# Patient Record
Sex: Male | Born: 1945 | Race: White | Hispanic: No | Marital: Married | Smoking: Never smoker
Health system: Southern US, Community
[De-identification: ages and names within clinical notes are randomized; demographics above are authoritative.]

## PROBLEM LIST (undated history)

## (undated) DIAGNOSIS — K5790 Diverticulosis of intestine, part unspecified, without perforation or abscess without bleeding: Secondary | ICD-10-CM

## (undated) DIAGNOSIS — N529 Male erectile dysfunction, unspecified: Secondary | ICD-10-CM

## (undated) DIAGNOSIS — F329 Major depressive disorder, single episode, unspecified: Secondary | ICD-10-CM

## (undated) DIAGNOSIS — R351 Nocturia: Secondary | ICD-10-CM

## (undated) DIAGNOSIS — Z86010 Personal history of colonic polyps: Secondary | ICD-10-CM

## (undated) DIAGNOSIS — E785 Hyperlipidemia, unspecified: Secondary | ICD-10-CM

## (undated) DIAGNOSIS — F32A Depression, unspecified: Secondary | ICD-10-CM

## (undated) DIAGNOSIS — I4891 Unspecified atrial fibrillation: Secondary | ICD-10-CM

## (undated) DIAGNOSIS — R7301 Impaired fasting glucose: Secondary | ICD-10-CM

## (undated) DIAGNOSIS — H269 Unspecified cataract: Secondary | ICD-10-CM

## (undated) DIAGNOSIS — N400 Enlarged prostate without lower urinary tract symptoms: Secondary | ICD-10-CM

## (undated) DIAGNOSIS — I10 Essential (primary) hypertension: Secondary | ICD-10-CM

## (undated) HISTORY — DX: Depression, unspecified: F32.A

## (undated) HISTORY — PX: OTHER SURGICAL HISTORY: SHX169

## (undated) HISTORY — DX: Unspecified atrial fibrillation: I48.91

## (undated) HISTORY — DX: Unspecified cataract: H26.9

## (undated) HISTORY — PX: CATARACT EXTRACTION: SUR2

## (undated) HISTORY — PX: COLONOSCOPY: SHX174

## (undated) HISTORY — DX: Essential (primary) hypertension: I10

## (undated) HISTORY — DX: Personal history of colonic polyps: Z86.010

## (undated) HISTORY — DX: Nocturia: R35.1

## (undated) HISTORY — DX: Major depressive disorder, single episode, unspecified: F32.9

---

## 1996-07-29 DIAGNOSIS — C4491 Basal cell carcinoma of skin, unspecified: Secondary | ICD-10-CM

## 1996-07-29 HISTORY — DX: Basal cell carcinoma of skin, unspecified: C44.91

## 2000-06-14 DIAGNOSIS — D229 Melanocytic nevi, unspecified: Secondary | ICD-10-CM

## 2000-06-14 HISTORY — DX: Melanocytic nevi, unspecified: D22.9

## 2001-06-08 ENCOUNTER — Encounter: Payer: Self-pay | Admitting: Family Medicine

## 2001-06-08 ENCOUNTER — Encounter: Admission: RE | Admit: 2001-06-08 | Discharge: 2001-06-08 | Payer: Self-pay | Admitting: Family Medicine

## 2001-07-02 DIAGNOSIS — C4492 Squamous cell carcinoma of skin, unspecified: Secondary | ICD-10-CM

## 2001-07-02 HISTORY — DX: Squamous cell carcinoma of skin, unspecified: C44.92

## 2004-01-07 ENCOUNTER — Encounter: Payer: Self-pay | Admitting: Orthopedic Surgery

## 2005-02-05 ENCOUNTER — Ambulatory Visit (HOSPITAL_COMMUNITY): Admission: RE | Admit: 2005-02-05 | Discharge: 2005-02-05 | Payer: Self-pay | Admitting: Family Medicine

## 2005-04-04 ENCOUNTER — Ambulatory Visit: Payer: Self-pay | Admitting: Internal Medicine

## 2005-04-19 ENCOUNTER — Ambulatory Visit: Payer: Self-pay | Admitting: Internal Medicine

## 2005-04-19 ENCOUNTER — Encounter (INDEPENDENT_AMBULATORY_CARE_PROVIDER_SITE_OTHER): Payer: Self-pay | Admitting: *Deleted

## 2005-04-19 DIAGNOSIS — Z860100 Personal history of colon polyps, unspecified: Secondary | ICD-10-CM

## 2005-04-19 HISTORY — DX: Personal history of colonic polyps: Z86.010

## 2005-04-19 HISTORY — DX: Personal history of colon polyps, unspecified: Z86.0100

## 2007-01-20 ENCOUNTER — Emergency Department (HOSPITAL_COMMUNITY): Admission: EM | Admit: 2007-01-20 | Discharge: 2007-01-20 | Payer: Self-pay | Admitting: Emergency Medicine

## 2009-02-02 ENCOUNTER — Ambulatory Visit: Payer: Self-pay | Admitting: Orthopedic Surgery

## 2009-02-02 ENCOUNTER — Ambulatory Visit (HOSPITAL_COMMUNITY): Admission: RE | Admit: 2009-02-02 | Discharge: 2009-02-02 | Payer: Self-pay | Admitting: Orthopedic Surgery

## 2009-02-02 DIAGNOSIS — M23302 Other meniscus derangements, unspecified lateral meniscus, unspecified knee: Secondary | ICD-10-CM | POA: Insufficient documentation

## 2009-02-02 DIAGNOSIS — M25569 Pain in unspecified knee: Secondary | ICD-10-CM

## 2009-10-09 DIAGNOSIS — E559 Vitamin D deficiency, unspecified: Secondary | ICD-10-CM | POA: Insufficient documentation

## 2009-11-27 DIAGNOSIS — F418 Other specified anxiety disorders: Secondary | ICD-10-CM | POA: Insufficient documentation

## 2010-04-08 ENCOUNTER — Encounter (INDEPENDENT_AMBULATORY_CARE_PROVIDER_SITE_OTHER): Payer: Self-pay | Admitting: *Deleted

## 2010-05-11 DIAGNOSIS — K5730 Diverticulosis of large intestine without perforation or abscess without bleeding: Secondary | ICD-10-CM | POA: Insufficient documentation

## 2010-05-20 ENCOUNTER — Encounter (INDEPENDENT_AMBULATORY_CARE_PROVIDER_SITE_OTHER): Payer: Self-pay | Admitting: *Deleted

## 2010-05-21 ENCOUNTER — Ambulatory Visit: Payer: Self-pay | Admitting: Internal Medicine

## 2010-06-04 ENCOUNTER — Ambulatory Visit: Payer: Self-pay | Admitting: Internal Medicine

## 2010-06-04 LAB — HM COLONOSCOPY

## 2010-06-09 ENCOUNTER — Encounter: Payer: Self-pay | Admitting: Internal Medicine

## 2010-08-10 DIAGNOSIS — R7301 Impaired fasting glucose: Secondary | ICD-10-CM | POA: Insufficient documentation

## 2010-11-30 ENCOUNTER — Encounter: Payer: Self-pay | Admitting: Orthopedic Surgery

## 2010-12-09 ENCOUNTER — Encounter: Payer: Self-pay | Admitting: Orthopedic Surgery

## 2010-12-21 NOTE — Miscellaneous (Signed)
Summary: [facility]  Clinical Lists Changes  Medications: Added new medication of MOVIPREP 100 GM  SOLR (PEG-KCL-NACL-NASULF-NA ASC-C) As per prep instructions. - Signed Rx of MOVIPREP 100 GM  SOLR (PEG-KCL-NACL-NASULF-NA ASC-C) As per prep instructions.;  #1 x 0;  Signed;  Entered by: Ezra Sites RN;  Authorized by: Iva Boop MD, [facility];  Method used: Electronically to [facility] [address] [place], [place], [place]  [address], Ph: [phone], Fax: [phone] Observations: Added new observation of NKA: T (05/21/2010 16:02)    Prescriptions: MOVIPREP 100 GM  SOLR (PEG-KCL-NACL-NASULF-NA ASC-C) As per prep instructions.  #1 x 0   Entered by:   Ezra Sites RN   Authorized by:   Iva Boop MD, [facility]   Signed by:   Ezra Sites RN on 05/21/2010   Method used:   Electronically to        [facility] (retail)       [address]       [place], [place]  [address]       Ph: [phone]       Fax: [phone]   RxID:   [id number]

## 2010-12-21 NOTE — Procedures (Signed)
Summary: Colonoscopy  Patient: [name] Note: All result statuses are Final unless otherwise noted.  Tests: (1) Colonoscopy (COL)   COL Colonoscopy           DONE     [facility]     [address]     [place], [place]  [address]           COLONOSCOPY PROCEDURE REPORT           PATIENT:  Jorge West, Jorge West  MR#:  [id number]     BIRTHDATE:  [birth year], 64 yrs. old  GENDER:  male     ENDOSCOPIST:  Iva Boop, MD, [facility]           PROCEDURE DATE:  06/04/2010     PROCEDURE:  Colonoscopy with snare polypectomy     ASA CLASS:  Class II     INDICATIONS:  surveillance and high-risk screening, history of     pre-cancerous (adenomatous) colon polyps diminutive adenoma     removed May 2006     MEDICATIONS:   Fentanyl 75 mcg IV, Versed 8 mg IV           DESCRIPTION OF PROCEDURE:   After the risks benefits and     alternatives of the procedure were thoroughly explained, informed     consent was obtained.  Digital rectal exam was performed and     revealed no abnormalities and normal prostate.   The [id number] [id number]     endoscope was introduced through the anus and advanced to the     cecum, which was identified by both the appendix and ileocecal     valve, without limitations.  The quality of the prep was     excellent, using MoviPrep.  The instrument was then slowly     withdrawn as the colon was fully examined. Insertion and     polypectomy x 2: 7:57 minutes Withdrawal: 639 minutes     <<PROCEDUREIMAGES>>           FINDINGS:  Three polyps were found. Transverse (3mm) and     descending (5mm x 2) Polyps were snared without cautery. Retrieval     was successful.   Mild diverticulosis was found in the sigmoid     colon.  This was otherwise a normal examination of the colon.     Retroflexed views in the rectum revealed internal hemorrhoids.     The scope was then withdrawn from the patient and the procedure     completed.           COMPLICATIONS:  None     ENDOSCOPIC  IMPRESSION:     1) Three polyps removed - max size 5mm     2) Mild diverticulosis in the sigmoid colon     3) Internal hemorrhoids     4) Otherwise normal examination, excellent prep     5) Personal hx diminutive adenoma removed 2006           REPEAT EXAM:  In for Colonoscopy, pending biopsy results.           Iva Boop, MD, [facility]           CC:  The Patient     Maryelizabeth Rowan, MD           n.     Rosalie Doctor:   Iva Boop at 06/04/2010 11:53 AM           Laretta Bolster, [id number]  Note: An exclamation  mark (!) indicates a result that was not dispersed into the flowsheet. Document Creation Date: 06/04/2010 11:54 AM _______________________________________________________________________  (1) Order result status: Final Collection or observation date-time: 06/04/2010 11:42 Requested date-time:  Receipt date-time:  Reported date-time:  Referring Physician:   Ordering Physician: Stan Head [phone]) Specimen Source:  Source: Launa Grill Order Number: [id number] Lab site:   Appended Document: Colonoscopy     Procedures Next Due Date:    Colonoscopy: 05/2015

## 2010-12-21 NOTE — Letter (Signed)
Summary: [facility] Instructions  [facility]  [address] [place], [place] [address]   Phone: [phone]  Fax: [phone]       Jorge West    May 27, 1946    MRN: [id number]        Procedure Day /Date:  Friday 06/04/2010     Arrival Time: 10:00 am      Procedure Time: 11:00 am     Location of Procedure:                    _ x_  [facility] (4th Floor)                        PREPARATION FOR COLONOSCOPY WITH MOVIPREP   Starting 5 days prior to your procedure Sunday 7/10 do not eat nuts, seeds, popcorn, corn, beans, peas,  salads, or any raw vegetables.  Do not take any fiber supplements (e.g. Metamucil, Citrucel, and Benefiber).  THE DAY BEFORE YOUR PROCEDURE         DATE: Thursday 7/14  1.  Drink clear liquids the entire day-NO SOLID FOOD  2.  Do not drink anything colored red or purple.  Avoid juices with pulp.  No orange juice.  3.  Drink at least 64 oz. (8 glasses) of fluid/clear liquids during the day to prevent dehydration and help the prep work efficiently.  CLEAR LIQUIDS INCLUDE: Water Jello Ice Popsicles Tea (sugar ok, no milk/cream) Powdered fruit flavored drinks Coffee (sugar ok, no milk/cream) Gatorade Juice: apple, white grape, white cranberry  Lemonade Clear bullion, consomm, broth Carbonated beverages (any kind) Strained chicken noodle soup Hard Candy                             4.  In the morning, mix first dose of MoviPrep solution:    Empty 1 Pouch A and 1 Pouch B into the disposable container    Add lukewarm drinking water to the top line of the container. Mix to dissolve    Refrigerate (mixed solution should be used within 24 hrs)  5.  Begin drinking the prep at 5:00 p.m. The MoviPrep container is divided by 4 marks.   Every 15 minutes drink the solution down to the next mark (approximately 8 oz) until the full liter is complete.   6.  Follow completed prep with 16 oz of clear liquid of your choice  (Nothing red or purple).  Continue to drink clear liquids until bedtime.  7.  Before going to bed, mix second dose of MoviPrep solution:    Empty 1 Pouch A and 1 Pouch B into the disposable container    Add lukewarm drinking water to the top line of the container. Mix to dissolve    Refrigerate  THE DAY OF YOUR PROCEDURE      DATE: Friday 7/15  Beginning at 6:00 a.m. (5 hours before procedure):         1. Every 15 minutes, drink the solution down to the next mark (approx 8 oz) until the full liter is complete.  2. Follow completed prep with 16 oz. of clear liquid of your choice.    3. You may drink clear liquids until 9:00 am (2 HOURS BEFORE PROCEDURE).   MEDICATION INSTRUCTIONS  Unless otherwise instructed, you should take regular prescription medications with a small sip of water   as early as possible the morning of  your procedure.          OTHER INSTRUCTIONS  You will need a responsible adult at least 65 years of age to accompany you and drive you home.   This person must remain in the waiting room during your procedure.  Wear loose fitting clothing that is easily removed.  Leave jewelry and other valuables at home.  However, you may wish to bring a book to read or  an iPod/MP3 player to listen to music as you wait for your procedure to start.  Remove all body piercing jewelry and leave at home.  Total time from sign-in until discharge is approximately 2-3 hours.  You should go home directly after your procedure and rest.  You can resume normal activities the  day after your procedure.  The day of your procedure you should not:   Drive   Make legal decisions   Operate machinery   Drink alcohol   Return to work  You will receive specific instructions about eating, activities and medications before you leave.    The above instructions have been reviewed and explained to me by   Ezra Sites RN  May 21, 2010 4:24 PM     I fully understand and can  verbalize these instructions _____________________________ Date _________

## 2010-12-21 NOTE — Letter (Signed)
Summary: Colonoscopy Letter  [facility]  [address] [place], [place] [address]   Phone: [phone]  Fax: [phone]      Apr 08, 2010 MRN: [id number]   Jorge West [address] [place], [place]  [address]   Dear [name],   According to your medical record, it is time for you to schedule a Colonoscopy. The American Cancer Society recommends this procedure as a method to detect early colon cancer. Patients with a family history of colon cancer, or a personal history of colon polyps or inflammatory bowel disease are at increased risk.  This letter has beeen generated based on the recommendations made at the time of your procedure. If you feel that in your particular situation this may no longer apply, please contact our office.  Please call our office at [phone] to schedule this appointment or to update your records at your earliest convenience.  Thank you for cooperating with [place] to provide you with the very best care possible.   Sincerely,  Iva Boop, M.D.  [facility] [phone]

## 2010-12-21 NOTE — Letter (Signed)
Summary: Patient Notice- Polyp Results  [facility]  [address] [place], [place] [address]   Phone: [phone]  Fax: [phone]        June 09, 2010 MRN: [id number]    Jorge West [address] [place], [place]  [address]    Dear [name],  The polyps removed from your colon were adenomatous. This means that they were pre-cancerous or that  they had the potential to change into cancer over time.  I recommend that you have a repeat colonoscopy in 5 years (around July 2016) to determine if you have developed any new polyps over time. If you develop any new rectal bleeding, abdominal pain or significant bowel habit changes, please contact us before then.  Please call us if you are having persistent problems or have questions about your condition that have not been fully answered at this time.  Sincerely,  Iva Boop MD, [facility]  This letter has been electronically signed by your physician.  Appended Document: Patient Notice- Polyp Results letter mailed.

## 2011-01-01 ENCOUNTER — Encounter: Payer: Self-pay | Admitting: Orthopedic Surgery

## 2011-01-01 ENCOUNTER — Emergency Department (HOSPITAL_COMMUNITY): Payer: BC Managed Care – PPO

## 2011-01-01 ENCOUNTER — Emergency Department (HOSPITAL_COMMUNITY)
Admission: EM | Admit: 2011-01-01 | Discharge: 2011-01-01 | Disposition: A | Discharge status: Home / Self Care | Payer: BC Managed Care – PPO | Attending: Emergency Medicine | Admitting: Emergency Medicine

## 2011-01-01 DIAGNOSIS — S82409A Unspecified fracture of shaft of unspecified fibula, initial encounter for closed fracture: Secondary | ICD-10-CM | POA: Insufficient documentation

## 2011-01-01 DIAGNOSIS — W11XXXA Fall on and from ladder, initial encounter: Secondary | ICD-10-CM | POA: Insufficient documentation

## 2011-01-01 DIAGNOSIS — Z79899 Other long term (current) drug therapy: Secondary | ICD-10-CM | POA: Insufficient documentation

## 2011-01-01 DIAGNOSIS — I10 Essential (primary) hypertension: Secondary | ICD-10-CM | POA: Insufficient documentation

## 2011-01-03 ENCOUNTER — Encounter: Payer: Self-pay | Admitting: Orthopedic Surgery

## 2011-01-03 ENCOUNTER — Ambulatory Visit (INDEPENDENT_AMBULATORY_CARE_PROVIDER_SITE_OTHER): Payer: BC Managed Care – PPO | Admitting: Orthopedic Surgery

## 2011-01-03 DIAGNOSIS — N401 Enlarged prostate with lower urinary tract symptoms: Secondary | ICD-10-CM | POA: Insufficient documentation

## 2011-01-03 DIAGNOSIS — S8263XA Displaced fracture of lateral malleolus of unspecified fibula, initial encounter for closed fracture: Secondary | ICD-10-CM | POA: Insufficient documentation

## 2011-01-12 NOTE — Assessment & Plan Note (Signed)
Summary: ap er fx rt ankle/xr there/new prob/bcbs/bsf   Visit Type:  new problem Referring Provider:  [facility]  CC:  right anklefracture.  History of Present Illness: I saw Jorge West in the office today for an initial visit.  He is a 65 years old man with the complaint of:  right ankle fracture  DOI 01-01-11.  Xrays at [facility] on 01-01-11, shows subtle spiral fracture of distal right fibula.  Medications: to scan in chart.    Date of injury was February 11. A dull, aching pain, relieved by Tylenol, extra strength. Pain intensity throughout and, time, and comes and goes.  Seems to vary with exercise and weight bearing.  Elevation. Bruising and swelling are noted and some tingling  Allergies: No Known Drug Allergies  Past History:  Past Surgical History: surgery for skin cancer. Cataract Extraction  Review of Systems Constitutional:  Denies weight loss, weight gain, fever, chills, and fatigue. Cardiovascular:  Denies chest pain, palpitations, fainting, and murmurs. Respiratory:  Denies short of breath, wheezing, couch, tightness, pain on inspiration, and snoring . Gastrointestinal:  Complains of heartburn; denies nausea, vomiting, diarrhea, constipation, and blood in your stools. Genitourinary:  Complains of frequency; denies urgency, difficulty urinating, painful urination, flank pain, and bleeding in urine. Neurologic:  Denies numbness, tingling, unsteady gait, dizziness, tremors, and seizure. Musculoskeletal:  Complains of swelling; denies joint pain, instability, stiffness, redness, heat, and muscle pain. Endocrine:  Denies excessive thirst, exessive urination, and heat or cold intolerance. Psychiatric:  Complains of depression; denies nervousness, anxiety, and hallucinations. Skin:  Denies changes in the skin, poor healing, rash, itching, and redness. HEENT:  Denies blurred or double vision, eye pain, redness, and watering. Immunology:  Denies seasonal  allergies, sinus problems, and allergic to bee stings. Hemoatologic:  Complains of brusing; denies easy bleeding.  Physical Exam  Additional Exam:   He is 5 feet 10 inches, all his weight is 191. Valentina Gu is a resting pulse of her proximally 78.  He is well-developed and well-nourished. He normally is oriented x3. His mood and affect are normal. Templating with crutches, nonweightbearing.  His RIGHT fibula is tender. His ankle is swmot iankle is stable. Strength in plantarflexion and dorsiflexion are normal. The skin to intact. The pulses are good   Impression & Recommendations:  Problem # 1:  CLOSED FRACTURE OF LATERAL MALLEOLUS (ICD-824.2) Assessment New  The x-rays were done at [facility]. The report and the films have been reviewed. nondisplaced fracture.  Orders: Est. Patient Level IV ([address]) Lat. Malleolus Fx ([address])  Patient Instructions: 1)  WEIGHT BEAR AS TOLERATED  2)  CRUTCHES AS NEEDED  3)  xrays in 3 weeks right ankle;   Orders Added: 1)  Est. Patient Level IV [[address]] 2)  Lat. Malleolus Fx [[address]]

## 2011-01-18 ENCOUNTER — Ambulatory Visit: Payer: Self-pay | Admitting: Orthopedic Surgery

## 2011-01-18 NOTE — Letter (Signed)
Summary: History form  History form   Imported By: Jacklynn Ganong 01/11/2011 14:04:55  _____________________________________________________________________  External Attachment:    Type:   Image     Comment:   External Document

## 2011-01-19 ENCOUNTER — Encounter: Payer: Self-pay | Admitting: Orthopedic Surgery

## 2011-01-25 ENCOUNTER — Encounter: Payer: Self-pay | Admitting: Orthopedic Surgery

## 2011-01-25 ENCOUNTER — Ambulatory Visit (INDEPENDENT_AMBULATORY_CARE_PROVIDER_SITE_OTHER): Payer: Medicare Other | Admitting: Orthopedic Surgery

## 2011-01-25 DIAGNOSIS — S8263XA Displaced fracture of lateral malleolus of unspecified fibula, initial encounter for closed fracture: Secondary | ICD-10-CM

## 2011-02-01 NOTE — Letter (Signed)
Summary: Historic office notes [facility]   Historic office notes [facility]   Imported By: Cammie Sickle 01/25/2011 19:27:28  _____________________________________________________________________  External Attachment:    Type:   Image     Comment:   External Document

## 2011-02-01 NOTE — Assessment & Plan Note (Signed)
Summary: 3 WK RE-CK/XRAYS RT ANKLE/MEDICARE(new primary)+BCBS 2ndary/CAF   Visit Type:  Follow-up Referring Provider:  [facility]  CC:  right ankle fracture.  History of Present Illness: I saw Jorge West in the office today for a 3 week  followup visit.  He is a 65 years old man with the complaint of:  right ankle fracture  DOI 01-01-11.  POST INJ DAY 24   Xrays at [facility] on 01-01-11, shows subtle spiral fracture of distal right fibula.  Medications: to scan in chart.  Date of injury was February 11. A dull, aching pain, relieved by Tylenol, extra strength. Pain intensity throughout and, time, and comes and goes.  Today, recheck with xrays.  Complaints: NONE    Allergies: No Known Drug Allergies  Physical Exam  Additional Exam:   RIGHT ankle.  No tenderness over the fibula. Mild swelling. No pain with rotation.     Impression & Recommendations:  Problem # 1:  CLOSED FRACTURE OF LATERAL MALLEOLUS (ICD-824.2) Assessment Improved  x-rays  Separate and Identifiable X-Ray report      3 views, RIGHT ankle.  He was noted distal fibula fracture is healed. Alignment normal.  Impression healed RIGHT ankle fracture.  Patient can remove his brace after Saturday.  Orders: Post-Op Check ([address]) Ankle x-ray complete,  minimum 3 views ([address])  Patient Instructions: 1)  REMOVE BRACE AFTER SATURDAY  2)  F/U as needed    Orders Added: 1)  Post-Op Check [[address]] 2)  Ankle x-ray complete,  minimum 3 views [[address]]

## 2011-10-20 ENCOUNTER — Other Ambulatory Visit: Payer: Self-pay | Admitting: Dermatology

## 2012-03-14 ENCOUNTER — Ambulatory Visit (INDEPENDENT_AMBULATORY_CARE_PROVIDER_SITE_OTHER): Payer: Medicare Other | Admitting: Orthopedic Surgery

## 2012-03-14 ENCOUNTER — Encounter: Payer: Self-pay | Admitting: Orthopedic Surgery

## 2012-03-14 VITALS — BP 120/72 | Ht 70.0 in | Wt 199.0 lb

## 2012-03-14 DIAGNOSIS — S62309A Unspecified fracture of unspecified metacarpal bone, initial encounter for closed fracture: Secondary | ICD-10-CM

## 2012-03-14 NOTE — Patient Instructions (Signed)
Keep  Cast dry   Do not get wet   If it gets wet dry with a hair dryer on low setting and call the office   

## 2012-03-15 ENCOUNTER — Encounter: Payer: Self-pay | Admitting: Orthopedic Surgery

## 2012-03-15 DIAGNOSIS — S62309A Unspecified fracture of unspecified metacarpal bone, initial encounter for closed fracture: Secondary | ICD-10-CM | POA: Insufficient documentation

## 2012-03-15 NOTE — Progress Notes (Signed)
  Subjective:    Jorge West is a 66 y.o. male who presents for evaluation of injury to his right hand  Patient fell Monday night April 22 of tracing appeared. He complains of 4-10 dull aching pain over the right long finger metacarpal. He was seen at urgent care he was evaluated there with an x-ray the x-ray was brought with them on a disc he basically is a spiral nondisplaced fracture of the long finger metacarpal  His review of systems is negative except for depression   The following portions of the patient's history were reviewed and updated as appropriate: allergies, current medications, past family history, past medical history, past social history, past surgical history and problem list.  Review of Systems Pertinent items are noted in HPI.    Objective:    BP 120/72  Ht 5\' 10"  (1.778 m)  Wt 199 lb (90.266 kg)  BMI 28.55 kg/m2  Vital signs are stable as recorded  General appearance is normal  The patient is alert and oriented x3  The patient's mood and affect are normal  Gait assessment: normal The cardiovascular exam reveals normal pulses and temperature without edema swelling.  The lymphatic system is negative for palpable lymph nodes  The sensory exam is normal.  There are no pathologic reflexes.  Balance is normal.   Exam of the right hand is no deformity there is swelling and tenderness over the long finger metacarpal. He's lost some of the motion at the metacarpophalangeal joint. The joint is otherwise stable. He's lost some grip strength secondary to pain. Skin is intact.   Imaging: X-ray right hand: fracture of Long finger metacarpal spiral fracture nondisplaced images on a disc from urgent care and [facility]    Assessment:    Right hand third metacarpal fracture    Plan:    Short cast on the hand with index and long finger include

## 2012-04-11 ENCOUNTER — Ambulatory Visit (INDEPENDENT_AMBULATORY_CARE_PROVIDER_SITE_OTHER): Payer: Medicare Other | Admitting: Orthopedic Surgery

## 2012-04-11 ENCOUNTER — Ambulatory Visit (INDEPENDENT_AMBULATORY_CARE_PROVIDER_SITE_OTHER): Payer: Medicare Other

## 2012-04-11 ENCOUNTER — Encounter: Payer: Self-pay | Admitting: Orthopedic Surgery

## 2012-04-11 VITALS — BP 94/60 | Ht 70.0 in | Wt 199.0 lb

## 2012-04-11 DIAGNOSIS — S62309A Unspecified fracture of unspecified metacarpal bone, initial encounter for closed fracture: Secondary | ICD-10-CM

## 2012-04-11 DIAGNOSIS — S6291XA Unspecified fracture of right wrist and hand, initial encounter for closed fracture: Secondary | ICD-10-CM

## 2012-04-11 NOTE — Progress Notes (Signed)
Patient ID: Jorge West, male   DOB: September 09, 1946, 66 y.o.   MRN: [id number] Chief Complaint  Patient presents with  . Follow-up    4 week recheck and xray right hand     BP 94/60  Ht 5\' 10"  (1.778 m)  Wt 199 lb (90.266 kg)  BMI 28.55 kg/m2  RIGHT hand 3rd metacarpal spiral fracture, nondisplaced, cast for one month. Minimal tenderness, stiffness noted of the metacarpophalangeal joints. X-ray show fracture callus.  Fracture, stable. Cast can be removed. Patient is advised to work on active range of motion. Activities as tolerated using pain as a guide followup as needed

## 2012-04-11 NOTE — Patient Instructions (Signed)
Activity as tolerated

## 2012-09-24 ENCOUNTER — Encounter (HOSPITAL_COMMUNITY): Payer: Self-pay | Admitting: Emergency Medicine

## 2012-09-24 ENCOUNTER — Emergency Department (HOSPITAL_COMMUNITY): Payer: Medicare Other

## 2012-09-24 ENCOUNTER — Encounter (HOSPITAL_COMMUNITY): Payer: Self-pay | Admitting: Certified Registered Nurse Anesthetist

## 2012-09-24 ENCOUNTER — Ambulatory Visit: Admit: 2012-09-24 | Payer: Self-pay | Admitting: Orthopedic Surgery

## 2012-09-24 ENCOUNTER — Emergency Department (HOSPITAL_COMMUNITY): Payer: Medicare Other | Admitting: Certified Registered Nurse Anesthetist

## 2012-09-24 ENCOUNTER — Encounter (HOSPITAL_COMMUNITY)
Admission: EM | Disposition: A | Discharge status: Home / Self Care | Payer: Self-pay | Source: Home / Self Care | Attending: Emergency Medicine

## 2012-09-24 ENCOUNTER — Emergency Department (HOSPITAL_COMMUNITY)
Admission: EM | Admit: 2012-09-24 | Discharge: 2012-09-25 | Disposition: A | Discharge status: Home / Self Care | Payer: Medicare Other | Attending: Emergency Medicine | Admitting: Emergency Medicine

## 2012-09-24 DIAGNOSIS — IMO0002 Reserved for concepts with insufficient information to code with codable children: Secondary | ICD-10-CM | POA: Insufficient documentation

## 2012-09-24 DIAGNOSIS — S62512A Displaced fracture of proximal phalanx of left thumb, initial encounter for closed fracture: Secondary | ICD-10-CM

## 2012-09-24 DIAGNOSIS — S6710XA Crushing injury of unspecified finger(s), initial encounter: Secondary | ICD-10-CM | POA: Insufficient documentation

## 2012-09-24 DIAGNOSIS — S61012A Laceration without foreign body of left thumb without damage to nail, initial encounter: Secondary | ICD-10-CM

## 2012-09-24 DIAGNOSIS — S62233B Other displaced fracture of base of first metacarpal bone, unspecified hand, initial encounter for open fracture: Secondary | ICD-10-CM | POA: Insufficient documentation

## 2012-09-24 DIAGNOSIS — Y9279 Other farm location as the place of occurrence of the external cause: Secondary | ICD-10-CM | POA: Insufficient documentation

## 2012-09-24 DIAGNOSIS — Y9389 Activity, other specified: Secondary | ICD-10-CM | POA: Insufficient documentation

## 2012-09-24 DIAGNOSIS — Z79899 Other long term (current) drug therapy: Secondary | ICD-10-CM | POA: Insufficient documentation

## 2012-09-24 DIAGNOSIS — W230XXA Caught, crushed, jammed, or pinched between moving objects, initial encounter: Secondary | ICD-10-CM | POA: Insufficient documentation

## 2012-09-24 DIAGNOSIS — I10 Essential (primary) hypertension: Secondary | ICD-10-CM | POA: Insufficient documentation

## 2012-09-24 DIAGNOSIS — S62202B Unspecified fracture of first metacarpal bone, left hand, initial encounter for open fracture: Secondary | ICD-10-CM

## 2012-09-24 HISTORY — PX: OPEN REDUCTION INTERNAL FIXATION (ORIF) METACARPAL: SHX6234

## 2012-09-24 LAB — POCT I-STAT, CHEM 8
HCT: 44 % (ref 39.0–52.0)
Hemoglobin: 15 g/dL (ref 13.0–17.0)
Potassium: 3.3 mEq/L — ABNORMAL LOW (ref 3.5–5.1)
Sodium: 144 mEq/L (ref 135–145)

## 2012-09-24 LAB — COMPREHENSIVE METABOLIC PANEL
BUN: 17 mg/dL (ref 6–23)
CO2: 25 mEq/L (ref 19–32)
Chloride: 105 mEq/L (ref 96–112)
Creatinine, Ser: 1.01 mg/dL (ref 0.50–1.35)
GFR calc Af Amer: 87 mL/min — ABNORMAL LOW (ref >90)
GFR calc non Af Amer: 75 mL/min — ABNORMAL LOW (ref >90)
Total Bilirubin: 0.3 mg/dL (ref 0.3–1.2)

## 2012-09-24 SURGERY — OPEN REDUCTION INTERNAL FIXATION (ORIF) METACARPAL
Anesthesia: General | Site: Thumb | Laterality: Left | Wound class: Clean

## 2012-09-24 MED ORDER — SUFENTANIL CITRATE 50 MCG/ML IV SOLN
INTRAVENOUS | Status: DC | PRN
  Administered 2012-09-24 (×2): 5 ug via INTRAVENOUS

## 2012-09-24 MED ORDER — DEXAMETHASONE SODIUM PHOSPHATE 4 MG/ML IJ SOLN
INTRAMUSCULAR | Status: DC | PRN
  Administered 2012-09-24: 4 mg via INTRAVENOUS

## 2012-09-24 MED ORDER — HYDROMORPHONE HCL PF 1 MG/ML IJ SOLN
1.0000 mg | Freq: Once | INTRAMUSCULAR | Status: AC
  Administered 2012-09-24: 1 mg via INTRAVENOUS
  Filled 2012-09-24: qty 1

## 2012-09-24 MED ORDER — ONDANSETRON HCL 4 MG/2ML IJ SOLN
INTRAMUSCULAR | Status: DC | PRN
  Administered 2012-09-24: 4 mg via INTRAVENOUS

## 2012-09-24 MED ORDER — PROPOFOL 10 MG/ML IV BOLUS
INTRAVENOUS | Status: DC | PRN
  Administered 2012-09-24: 200 mg via INTRAVENOUS

## 2012-09-24 MED ORDER — DIPHTH-ACELL PERTUSSIS-TETANUS 25-58-10 LF-MCG/0.5 IM SUSP
0.5000 mL | Freq: Once | INTRAMUSCULAR | Status: AC
  Administered 2012-09-24: 0.5 mL via INTRAMUSCULAR
  Filled 2012-09-24: qty 0.5

## 2012-09-24 MED ORDER — CEFAZOLIN SODIUM 1-5 GM-% IV SOLN
1.0000 g | Freq: Once | INTRAVENOUS | Status: AC
  Administered 2012-09-24: 1 g via INTRAVENOUS
  Administered 2012-09-24: 2 g via INTRAVENOUS
  Filled 2012-09-24: qty 50

## 2012-09-24 MED ORDER — SODIUM CHLORIDE 0.9 % IV SOLN
Freq: Once | INTRAVENOUS | Status: AC
  Administered 2012-09-24: 14:00:00 via INTRAVENOUS

## 2012-09-24 MED ORDER — SODIUM CHLORIDE 0.9 % IR SOLN
Status: DC | PRN
  Administered 2012-09-24: 1000 mL

## 2012-09-24 MED ORDER — GLYCOPYRROLATE 0.2 MG/ML IJ SOLN
INTRAMUSCULAR | Status: DC | PRN
  Administered 2012-09-24: 0.2 mg via INTRAVENOUS

## 2012-09-24 MED ORDER — LIDOCAINE HCL (CARDIAC) 20 MG/ML IV SOLN
INTRAVENOUS | Status: DC | PRN
  Administered 2012-09-24: 100 mg via INTRAVENOUS

## 2012-09-24 MED ORDER — ONDANSETRON HCL 4 MG/2ML IJ SOLN
4.0000 mg | Freq: Once | INTRAMUSCULAR | Status: AC
  Administered 2012-09-24: 4 mg via INTRAVENOUS
  Filled 2012-09-24: qty 2

## 2012-09-24 MED ORDER — LACTATED RINGERS IV SOLN
INTRAVENOUS | Status: DC | PRN
  Administered 2012-09-24 (×2): via INTRAVENOUS

## 2012-09-24 MED ORDER — EPHEDRINE SULFATE 50 MG/ML IJ SOLN
INTRAMUSCULAR | Status: DC | PRN
  Administered 2012-09-24: 10 mg via INTRAVENOUS
  Administered 2012-09-24 (×2): 5 mg via INTRAVENOUS
  Administered 2012-09-25: 10 mg via INTRAVENOUS

## 2012-09-24 SURGICAL SUPPLY — 59 items
0.035 K-wire ×2 IMPLANT
0.045 K-wire ×3 IMPLANT
BANDAGE ELASTIC 3 VELCRO ST LF (GAUZE/BANDAGES/DRESSINGS) ×2 IMPLANT
BANDAGE ELASTIC 4 VELCRO ST LF (GAUZE/BANDAGES/DRESSINGS) ×2 IMPLANT
BANDAGE GAUZE 4  KLING STR (GAUZE/BANDAGES/DRESSINGS) ×1 IMPLANT
BANDAGE GAUZE ELAST BULKY 4 IN (GAUZE/BANDAGES/DRESSINGS) ×2 IMPLANT
BLADE SURG ROTATE 9660 (MISCELLANEOUS) IMPLANT
BNDG CMPR 9X4 STRL LF SNTH (GAUZE/BANDAGES/DRESSINGS) ×1
BNDG ESMARK 4X9 LF (GAUZE/BANDAGES/DRESSINGS) ×2 IMPLANT
CLOTH BEACON ORANGE TIMEOUT ST (SAFETY) ×2 IMPLANT
CORDS BIPOLAR (ELECTRODE) ×2 IMPLANT
COVER SURGICAL LIGHT HANDLE (MISCELLANEOUS) ×2 IMPLANT
CUFF TOURNIQUET SINGLE 18IN (TOURNIQUET CUFF) ×2 IMPLANT
CUFF TOURNIQUET SINGLE 24IN (TOURNIQUET CUFF) IMPLANT
DRAIN TLS ROUND 10FR (DRAIN) IMPLANT
DRAPE OEC MINIVIEW 54X84 (DRAPES) ×2 IMPLANT
DRAPE SURG 17X11 SM STRL (DRAPES) ×2 IMPLANT
DRSG ADAPTIC 3X8 NADH LF (GAUZE/BANDAGES/DRESSINGS) ×2 IMPLANT
ELECT REM PT RETURN 9FT ADLT (ELECTROSURGICAL)
ELECTRODE REM PT RTRN 9FT ADLT (ELECTROSURGICAL) IMPLANT
GAUZE SPONGE 4X4 16PLY XRAY LF (GAUZE/BANDAGES/DRESSINGS) ×2 IMPLANT
GAUZE XEROFORM 5X9 LF (GAUZE/BANDAGES/DRESSINGS) ×1 IMPLANT
GLOVE BIOGEL PI IND STRL 8.5 (GLOVE) ×1 IMPLANT
GLOVE BIOGEL PI INDICATOR 8.5 (GLOVE) ×2
GLOVE SURG ORTHO 8.0 STRL STRW (GLOVE) ×2 IMPLANT
GOWN PREVENTION PLUS XLARGE (GOWN DISPOSABLE) ×2 IMPLANT
GOWN STRL NON-REIN LRG LVL3 (GOWN DISPOSABLE) ×4 IMPLANT
K-WIRE .045 CH (WIRE) ×6
KIT BASIN OR (CUSTOM PROCEDURE TRAY) ×2 IMPLANT
KIT ROOM TURNOVER OR (KITS) ×2 IMPLANT
KWIRE .045 CH (WIRE) IMPLANT
KWIRE 4.0 X .035IN (WIRE) ×2 IMPLANT
MANIFOLD NEPTUNE II (INSTRUMENTS) ×2 IMPLANT
NDL HYPO 25X1 1.5 SAFETY (NEEDLE) ×1 IMPLANT
NEEDLE HYPO 25X1 1.5 SAFETY (NEEDLE) ×2 IMPLANT
NS IRRIG 1000ML POUR BTL (IV SOLUTION) ×2 IMPLANT
PACK ORTHO EXTREMITY (CUSTOM PROCEDURE TRAY) ×2 IMPLANT
PAD ARMBOARD 7.5X6 YLW CONV (MISCELLANEOUS) ×4 IMPLANT
PAD CAST 4YDX4 CTTN HI CHSV (CAST SUPPLIES) ×1 IMPLANT
PADDING CAST ABS 4INX4YD NS (CAST SUPPLIES) ×1
PADDING CAST ABS COTTON 4X4 ST (CAST SUPPLIES) IMPLANT
PADDING CAST COTTON 4X4 STRL (CAST SUPPLIES) ×2
SOAP 2 % CHG 4 OZ (WOUND CARE) ×2 IMPLANT
SPONGE GAUZE 4X4 12PLY (GAUZE/BANDAGES/DRESSINGS) ×2 IMPLANT
STRIP CLOSURE SKIN 1/2X4 (GAUZE/BANDAGES/DRESSINGS) ×1 IMPLANT
SUCTION FRAZIER TIP 10 FR DISP (SUCTIONS) ×2 IMPLANT
SUT ETHILON 4 0 PS 2 18 (SUTURE) ×2 IMPLANT
SUT MNCRL AB 3-0 PS2 18 (SUTURE) ×1 IMPLANT
SUT MNCRL AB 4-0 PS2 18 (SUTURE) IMPLANT
SUT PROLENE 4 0 PS 2 18 (SUTURE) ×3 IMPLANT
SUT VIC AB 2-0 FS1 27 (SUTURE) ×2 IMPLANT
SUT VICRYL 4-0 PS2 18IN ABS (SUTURE) ×2 IMPLANT
SYR CONTROL 10ML LL (SYRINGE) ×1 IMPLANT
SYSTEM CHEST DRAIN TLS 7FR (DRAIN) IMPLANT
TOWEL OR 17X24 6PK STRL BLUE (TOWEL DISPOSABLE) ×2 IMPLANT
TOWEL OR 17X26 10 PK STRL BLUE (TOWEL DISPOSABLE) ×2 IMPLANT
TUBE CONNECTING 12X1/4 (SUCTIONS) ×2 IMPLANT
WATER STERILE IRR 1000ML POUR (IV SOLUTION) ×2 IMPLANT
YANKAUER SUCT BULB TIP NO VENT (SUCTIONS) ×1 IMPLANT

## 2012-09-24 NOTE — ED Notes (Signed)
pts ring removed via bolt cutters.

## 2012-09-24 NOTE — ED Notes (Signed)
Pt transported to OR

## 2012-09-24 NOTE — ED Notes (Signed)
Pt states pain coming back. edp aware and vo received.

## 2012-09-24 NOTE — ED Notes (Addendum)
Dressing to left hand removed; partial skin avulsion noted to dorsal aspect of left thumb extending from medial aspect of left thumb at DIP joint around to base of thumb towards wrist area; mod amt of swelling noted to palmar aspect of left hand at base of thumbminimal bleeding noted; brisk cap refill; good PMS to all 5 digits of left hand with exception of inability to touch thumb to fifth digit secondary to swelling; LUE elevated on pillows; pt tolerating well; appears to be resting comfortably; declines pain meds at this time

## 2012-09-24 NOTE — ED Notes (Addendum)
Transfer from [facility] via [facility] for higher level of care - eval of left hand trauma - to see hand surgeon; pt is rgt hand dominant; dry dressing intact to left hand; pt reports still has wedding band on left ring finger; pt awake, alert, oriented x4; o2 sats 90% RA; placed on 2L O2/[place] - o2 sats increasing to 94%

## 2012-09-24 NOTE — H&P (Signed)
Jorge West is an 66 y.o. male.   Chief Complaint: Left thumb injury HPI: pt loading cattle into truck, door kicked back injuring left thumb Pt seen and evaluated at [facility] and sent here for definitive treatment for left thumb fracture Pt without prior injury to left thumb  Past Medical History  Diagnosis Date  . Hypertension   . Depression   . Nocturia     Past Surgical History  Procedure Date  . Surgery for skin cancer   . Cataract extraction     Family History  Problem Relation Age of Onset  . Cancer      family history    Social History:  reports that he has never smoked. He does not have any smokeless tobacco history on file. He reports that he does not drink alcohol or use illicit drugs.  Allergies: No Known Allergies   (Not in a hospital admission)  No results found for this or any previous visit (from the past 48 hour(s)). Dg Hand Complete Left  09/24/2012  *RADIOLOGY REPORT*  Clinical Data: Trauma, cow kicked a door which then struck his hand, thumb pain  LEFT HAND - COMPLETE 3+ VIEW  Comparison: None  Findings: Scattered dressing/soft tissue artifacts. Osseous mineralization normal. Comminuted displaced fracture at base of proximal phalanx left thumb with intra-articular extension. Additionally, displaced fracture of base of first metacarpal. No additional fracture or dislocation seen. Jewelry artifact at the proximal phalanx ring finger. Soft tissue swelling greatest at radial border of hand, thumb, and index finger.  IMPRESSION: Comminuted displaced intra-articular fracture at base of proximal phalanx left thumb. Displaced fracture proximal first metacarpal. Extensive soft tissue swelling and scattered dressing/soft tissue artifacts.   Original Report Authenticated By: Ulyses Southward, M.D.     No recent illnesses or hospitalizations  Blood pressure 147/78, pulse 69, temperature 97.9 F (36.6 C), temperature source Oral, resp. rate 16, height 5\' 10"  (1.778  m), weight 89.812 kg (198 lb), SpO2 90.00%. General Appearance:  Alert, cooperative, no distress, appears stated age  Head:  Normocephalic, without obvious abnormality, atraumatic  Eyes:  Pupils equal, conjunctiva/corneas clear,         Throat: Lips, mucosa, and tongue normal; teeth and gums normal  Neck: No visible masses     Lungs:   respirations unlabored  Chest Wall:  No tenderness or deformity  Heart:  Regular rate and rhythm,  Abdomen:   Soft, non-tender,         Extremities: Left thumb open laceration dorsal aspect of thumb, greater than 5 cm along dorsal ulnar margin Able to flex thumb ip joint able to extend thumb ip joint Fingers warm well perfused No injury to long/ring/small index  Pulses: 2+ and symmetric  Skin: Skin color, texture, turgor normal, no rashes or lesions     Neurologic: Normal    Assessment/Plan Left thumb open proximal phalanx and metacarpal fracture Grade II open fracture  Left thumb open reduction and internal fixation and repair as indicated and debridement.  R/B/A DISCUSSED WITH PT IN OFFICE.  PT VOICED UNDERSTANDING OF PLAN CONSENT SIGNED DAY OF SURGERY PT SEEN AND EXAMINED PRIOR TO OPERATIVE PROCEDURE/DAY OF SURGERY SITE MARKED. QUESTIONS ANSWERED WILL REMAIN IN INPATIENT FOLLOWING SURGERY  Sharma Covert 09/24/2012, 6:02 PM

## 2012-09-24 NOTE — ED Notes (Signed)
Pt has large laceration to left hand from metal door.

## 2012-09-24 NOTE — ED Provider Notes (Signed)
History   This chart was scribed for Donnetta Hutching, MD by Charolett Bumpers . The patient was seen in room APA01/APA01. Patient's care was started at 1349.   CSN: [id number]  Arrival date & time 09/24/12  1320   First MD Initiated Contact with Patient 09/24/12 1349      Chief Complaint  Patient presents with  . Hand Injury    The history is provided by the patient. No language interpreter was used.   Jorge West is a 66 y.o. male who presents to the Emergency Department complaining of severe left hand injury with active bleeding that occurred PTA. He states that he was trying to load cattle, when the cow kicked the door slamming his left hand. He states the door was metal which cut his left thumb. He is unsure if his Tetanus is UTD. He has a h/o HTN.   PCP: Dareen Piano at [facility] in [place]  Past Medical History  Diagnosis Date  . Hypertension   . Depression   . Nocturia     Past Surgical History  Procedure Date  . Surgery for skin cancer   . Cataract extraction     Family History  Problem Relation Age of Onset  . Cancer      family history     History  Substance Use Topics  . Smoking status: Never Smoker   . Smokeless tobacco: Not on file  . Alcohol Use: No      Review of Systems A complete 10 system review of systems was obtained and all systems are negative except as noted in the HPI and PMH.   Allergies  Review of patient's allergies indicates no known allergies.  Home Medications   Current Outpatient Rx  Name  Route  Sig  Dispense  Refill  . ACETAMINOPHEN 500 MG PO TABS   Oral   Take 500 mg by mouth.         . ASPIRIN 81 MG PO TABS   Oral   Take 81 mg by mouth daily.         . BUPROPION HCL ER (XL) 300 MG PO TB24   Oral   Take 300 mg by mouth daily.         Marland Kitchen VITAMIN D3 3000 UNITS PO TABS   Oral   Take by mouth.         . NABUMETONE 750 MG PO TABS   Oral   Take 750 mg by mouth.         Marland Kitchen FISH OIL PO  Oral   Take by mouth.         Marland Kitchen PRAVASTATIN SODIUM 20 MG PO TABS   Oral   Take 20 mg by mouth.         . QUINAPRIL HCL 40 MG PO TABS   Oral   Take 40 mg by mouth.         Marland Kitchen RANITIDINE HCL 150 MG PO TABS   Oral   Take 150 mg by mouth.         . TERAZOSIN HCL 1 MG PO CAPS   Oral   Take 1 mg by mouth.           BP 149/90  Pulse 83  Temp 98.3 F (36.8 C) (Oral)  Resp 20  Ht 5\' 10"  (1.778 m)  Wt 198 lb (89.812 kg)  BMI 28.41 kg/m2  SpO2 98%  Physical Exam  Nursing note and vitals reviewed. Constitutional:  He is oriented to person, place, and time. He appears well-developed and well-nourished.  HENT:  Head: Normocephalic and atraumatic.  Eyes: Conjunctivae normal and EOM are normal. Pupils are equal, round, and reactive to light.  Neck: Normal range of motion. Neck supple.  Cardiovascular: Normal rate, regular rhythm and normal heart sounds.   Pulmonary/Chest: Effort normal and breath sounds normal.  Abdominal: Soft. Bowel sounds are normal.  Musculoskeletal: Normal range of motion.  Neurological: He is alert and oriented to person, place, and time.  Skin: Skin is warm and dry.       Left thumb, a j-shaped lac that starts at the medial dorsal DIP joint and extends to the thenar eminence that is deep and macerated. Tendon appears intact. Total length 9 cm.   Psychiatric: He has a normal mood and affect.    ED Course  Procedures (including critical care time)  DIAGNOSTIC STUDIES: Oxygen Saturation is 98% on room air, normal by my interpretation.    COORDINATION OF CARE:  14:02-Discussed planned course of treatment with the patient including an x-ray, updating Tetanus, pain medication and transport to [facility] to see hand specialist, who is agreeable at this time.   Labs Reviewed - No data to display Dg Hand Complete Left  09/24/2012  *RADIOLOGY REPORT*  Clinical Data: Trauma, cow kicked a door which then struck his hand, thumb pain  LEFT HAND - COMPLETE  3+ VIEW  Comparison: None  Findings: Scattered dressing/soft tissue artifacts. Osseous mineralization normal. Comminuted displaced fracture at base of proximal phalanx left thumb with intra-articular extension. Additionally, displaced fracture of base of first metacarpal. No additional fracture or dislocation seen. Jewelry artifact at the proximal phalanx ring finger. Soft tissue swelling greatest at radial border of hand, thumb, and index finger.  IMPRESSION: Comminuted displaced intra-articular fracture at base of proximal phalanx left thumb. Displaced fracture proximal first metacarpal. Extensive soft tissue swelling and scattered dressing/soft tissue artifacts.   Original Report Authenticated By: Ulyses Southward, M.D.      No diagnosis found.  CRITICAL CARE Performed by: Donnetta Hutching   Total critical care time: 30  Critical care time was exclusive of separately billable procedures and treating other patients.  Critical care was necessary to treat or prevent imminent or life-threatening deterioration.  Critical care was time spent personally by me on the following activities: development of treatment plan with patient and/or surrogate as well as nursing, discussions with consultants, evaluation of patient's response to treatment, examination of patient, obtaining history from patient or surrogate, ordering and performing treatments and interventions, ordering and review of laboratory studies, ordering and review of radiographic studies, pulse oximetry and re-evaluation of patient's condition.  MDM  Status post open comminuted displaced fracture of left hand both proximal phalanx and first metacarpal bones.  Tetanus updated, IV Ancef, IV pain management, discussed with hand surgeon in [facility] Dr. Melvyn Novas who will accept transfer to [facility] emergency department.  Wound was examined.  Gross exam shows reasonable range of motion of thumb and hand.  Tendon appears grossly intact.    I personally  performed the services described in this documentation, which was scribed in my presence. The recorded information has been reviewed and considered.      Donnetta Hutching, MD 09/24/12 [phone]

## 2012-09-24 NOTE — Anesthesia Preprocedure Evaluation (Signed)
Anesthesia Evaluation  Patient identified by MRN, date of birth, ID band Patient awake    Reviewed: Allergy & Precautions, H&P , NPO status , Patient's Chart, lab work & pertinent test results  Airway Mallampati: II  Neck ROM: full    Dental   Pulmonary          Cardiovascular hypertension,     Neuro/Psych Depression    GI/Hepatic   Endo/Other    Renal/GU      Musculoskeletal   Abdominal   Peds  Hematology   Anesthesia Other Findings   Reproductive/Obstetrics                           Anesthesia Physical Anesthesia Plan  ASA: II  Anesthesia Plan: General   Post-op Pain Management:    Induction: Intravenous  Airway Management Planned: LMA  Additional Equipment:   Intra-op Plan:   Post-operative Plan:   Informed Consent: I have reviewed the patients History and Physical, chart, labs and discussed the procedure including the risks, benefits and alternatives for the proposed anesthesia with the patient or authorized representative who has indicated his/her understanding and acceptance.     Plan Discussed with: CRNA and Surgeon  Anesthesia Plan Comments:         Anesthesia Quick Evaluation

## 2012-09-24 NOTE — ED Notes (Signed)
Report given to carelink and on way. Pt/wife aware.

## 2012-09-25 MED ORDER — ONDANSETRON HCL 4 MG/2ML IJ SOLN
4.0000 mg | Freq: Four times a day (QID) | INTRAMUSCULAR | Status: AC | PRN
  Administered 2012-09-25: 4 mg via INTRAVENOUS

## 2012-09-25 MED ORDER — SIMVASTATIN 5 MG PO TABS: 5.0000 mg | ORAL_TABLET | Freq: Every day | ORAL | Status: DC

## 2012-09-25 MED ORDER — HYDROMORPHONE HCL PF 1 MG/ML IJ SOLN
0.2500 mg | INTRAMUSCULAR | Status: DC | PRN
  Administered 2012-09-25 (×2): 0.5 mg via INTRAVENOUS

## 2012-09-25 MED ORDER — QUINAPRIL HCL 10 MG PO TABS: 40.0000 mg | ORAL_TABLET | Freq: Every day | ORAL | Status: DC

## 2012-09-25 MED ORDER — DOCUSATE SODIUM 100 MG PO CAPS: 100.0000 mg | ORAL_CAPSULE | Freq: Two times a day (BID) | ORAL | Status: DC

## 2012-09-25 MED ORDER — TERAZOSIN HCL 1 MG PO CAPS: 1.0000 mg | ORAL_CAPSULE | Freq: Once | ORAL | Status: DC

## 2012-09-25 MED ORDER — HYDROMORPHONE HCL PF 1 MG/ML IJ SOLN
INTRAMUSCULAR | Status: AC
  Filled 2012-09-25: qty 1

## 2012-09-25 MED ORDER — BUPIVACAINE HCL (PF) 0.25 % IJ SOLN
INTRAMUSCULAR | Status: DC | PRN
  Administered 2012-09-25: 10 mL

## 2012-09-25 MED ORDER — OXYCODONE HCL 5 MG/5ML PO SOLN: 5.0000 mg | Freq: Once | ORAL | Status: AC | PRN

## 2012-09-25 MED ORDER — BUPROPION HCL ER (XL) 300 MG PO TB24: 300.0000 mg | ORAL_TABLET | Freq: Every day | ORAL | Status: DC

## 2012-09-25 MED ORDER — ASPIRIN EC 81 MG PO TBEC: 81.0000 mg | DELAYED_RELEASE_TABLET | Freq: Every day | ORAL | Status: DC

## 2012-09-25 MED ORDER — FAMOTIDINE 20 MG PO TABS: 10.0000 mg | ORAL_TABLET | Freq: Every day | ORAL | Status: DC

## 2012-09-25 MED ORDER — OXYCODONE-ACETAMINOPHEN 5-325 MG PO TABS: 1.0000 | ORAL_TABLET | ORAL | Status: DC | PRN

## 2012-09-25 MED ORDER — OXYCODONE HCL 5 MG PO TABS
ORAL_TABLET | ORAL | Status: AC
  Filled 2012-09-25: qty 1

## 2012-09-25 MED ORDER — METHOCARBAMOL 500 MG PO TABS: 500.0000 mg | ORAL_TABLET | Freq: Four times a day (QID) | ORAL | Status: DC

## 2012-09-25 MED ORDER — OXYCODONE HCL 5 MG PO TABS
5.0000 mg | ORAL_TABLET | Freq: Once | ORAL | Status: AC | PRN
  Administered 2012-09-25: 5 mg via ORAL

## 2012-09-25 MED ORDER — VITAMIN C 500 MG PO TABS: 500.0000 mg | ORAL_TABLET | Freq: Every day | ORAL | Status: AC

## 2012-09-25 MED ORDER — ONDANSETRON HCL 4 MG/2ML IJ SOLN
INTRAMUSCULAR | Status: AC
  Filled 2012-09-25: qty 2

## 2012-09-25 MED ORDER — CEPHALEXIN 500 MG PO CAPS: 500.0000 mg | ORAL_CAPSULE | Freq: Four times a day (QID) | ORAL | Status: DC

## 2012-09-25 NOTE — Op Note (Signed)
NAMEMADDOCK, BROSNAHAN           ACCOUNT NO.:  [id number]  MEDICAL RECORD NO.:  [id number]  LOCATION:  OTFC                         FACILITY:  [facility]  PHYSICIAN:  Madelynn Done, MD  DATE OF BIRTH:  October 11, 1946  DATE OF PROCEDURE:  09/24/2012 DATE OF DISCHARGE:  09/25/2012                              OPERATIVE REPORT   PREOPERATIVE DIAGNOSIS:  Grade 2 open left thumb proximal phalanx and metacarpal fracture.  POSTOPERATIVE DIAGNOSIS:  Grade 2 open left thumb proximal phalanx and metacarpal fracture.  ATTENDING PHYSICIAN:  Madelynn Done, MD, who scrubbed who scrubbed and present for the entire procedure.  ASSISTANT SURGEON:  None.  ANESTHESIA:  General via LMA.  SURGICAL PROCEDURE: 1. Debridement of skin and subcutaneous tissue, muscle and bone     associated with open fracture, left thumb proximal phalanx of     metacarpal, excisional debridement. 2. Open treatment of left thumb proximal phalanx fracture involving     articular surface left thumb, requiring internal fixation. 3. Open treatment of left thumb metacarpal base fracture, Bennett     Rolando type fracture pattern involving the [facility] joint requiring     internal fixation. 4. Radiographs 3 views, left thumb.  SURGICAL IMPLANTS:  Three 0.045 K-wires and two 0.035 K-wires.  SURGICAL INDICATION:  Mr. Lienhart is a 66 year old gentleman who sustained a crushing injury to his left thumb.  The patient sustained a large 6 cm open wound over the dorsal aspect of his left thumb.  The patient was seen and evaluated and recommended that he undergo the above procedure.  Risks, benefits, and alternatives were discussed in detail with the patient and signed informed consent was obtained.  Risks include, but not limited to bleeding, infection, damage to nearby nerves, arteries, or tendons, loss of motion of wrist and digits, incomplete relief of symptoms, need for further surgical intervention.  DESCRIPTION OF  PROCEDURE:  The patient was properly identified in the preop holding area and mark with a permanent marker made on the left thumb to indicate correct operative site.  The patient was then brought back to the operating room, placed supine on the anesthesia room table. General anesthesia was administered.  The patient tolerated this well. Well-padded tourniquet was then placed on the left brachium and sealed with 1000 drape.  The left upper extremity was then prepped and draped in normal sterile fashion.  Time-out was called, correct site was identified, and procedure then begun.  Attention then turned to the left thumb.  The limb was then elevated using Esmarch exsanguination and tourniquet insufflated.  The 6 cm open wound was then lengthened proximally.  This exposed the fracture site.  Excisional debridement was carried out of the skin, subcutaneous tissue, and bone of the proximal phalanx and thumb metacarpal.  Excisional debridement carried out sharply with knife, rongeurs, and curette.  Once this was carried out, an open reduction was then performed of the proximal phalanx, which was highly comminuted, more than 6-7 fragments.  Given the poor quality of the bone and the multiple comminuted fracture fragments, the articular surface was then reduced and held in place with a reduction clamp. Following this, a 0.045 K-wire was then placed antegrade  and then placed retrograde across the IP joint and the MP joint serving as an intramedullary rod keeping the finger out to length.  This had good purchase in the thumb metacarpal head.  Two more 0.035 K-wires were then placed in a cross pattern across in a perpendicular manner engaging the articular surface, which reduced the articular surface in near anatomic position.  After ORIF of the articular surface of the proximal phalanx, attention was then turned proximally where the thumb metacarpal base fracture, which extended into the joint was  then reduced.  Two 0.045 K- wires were then placed across the fracture site into the thumb CMC joint.  There was good reduction overall position of the thumb following open reduction and internal fixation.  K-wires were then cut and the metacarpal pins were left beneath the skin.  The longitudinal wire was then left beneath the nail bed.  The wound was copiously irrigated.  The extensor mechanism over the MP joint was then repaired with 3-0 Monocryl suture.  The skin was then closed with simple 4-0 Prolene sutures. Xeroform was then applied around the pin sites and the two 0.035 K-wires were sticking out.  The others were beneath the skin.  Xeroform dressing and sterile compressive bandage then applied.  The patient was then placed in a well-padded thumb spica splint, extubated, and taken to recovery room in good condition.  INTRAOPERATIVE RADIOGRAPHS:  Three views of the thumb did show the internal fixation in place.  There was good overall position with a highly comminuted thumb proximal phalanx fracture.  POSTPROCEDURE PLAN:  The patient discharged to home, seen back in the office in approximately 9 days for wound check, application of a cast screw next week, suture out at the 2-week mark, then seen at the 2-week mark, 4-week mark, and then 6-week mark, and likely consider removing some of the pins at the 4-week mark and 6-week mark depending on fracture healing.     Madelynn Done, MD     FWO/MEDQ  D:  09/25/2012  T:  09/25/2012  Job:  [id number]

## 2012-09-25 NOTE — Transfer of Care (Signed)
Immediate Anesthesia Transfer of Care Note  Patient: Jorge West  Procedure(s) Performed: Procedure(s) (LRB) with comments: OPEN REDUCTION INTERNAL FIXATION (ORIF) METACARPAL (Left) - and proximal phalanges.  Patient Location: PACU  Anesthesia Type:General  Level of Consciousness: oriented, sedated, patient cooperative and responds to stimulation  Airway & Oxygen Therapy: Patient Spontanous Breathing and Patient connected to nasal cannula oxygen  Post-op Assessment: Report given to PACU RN, Post -op Vital signs reviewed and stable and Patient moving all extremities X 4  Post vital signs: Reviewed and stable  Complications: No apparent anesthesia complications

## 2012-09-25 NOTE — Anesthesia Postprocedure Evaluation (Signed)
Anesthesia Post Note  Patient: Jorge West  Procedure(s) Performed: Procedure(s) (LRB): OPEN REDUCTION INTERNAL FIXATION (ORIF) METACARPAL (Left)  Anesthesia type: General  Patient location: PACU  Post pain: Pain level controlled and Adequate analgesia  Post assessment: Post-op Vital signs reviewed, Patient's Cardiovascular Status Stable, Respiratory Function Stable, Patent Airway and Pain level controlled  Last Vitals:  Filed Vitals:   09/25/12 0045  BP: 135/77  Pulse: 70  Temp: 36.5 C  Resp: 15    Post vital signs: Reviewed and stable  Level of consciousness: awake, alert  and oriented  Complications: No apparent anesthesia complications

## 2012-09-25 NOTE — Brief Op Note (Signed)
09/24/2012 - 09/25/2012  12:49 AM  PATIENT:  Jorge West  66 y.o. male  PRE-OPERATIVE DIAGNOSIS:  Fractured Left Thumb OPEN  POST-OPERATIVE DIAGNOSIS:  Fractured Left Thumb OPEN  PROCEDURE:  Procedure(s) (LRB) with comments: OPEN REDUCTION INTERNAL FIXATION (ORIF) METACARPAL (Left) - and proximal phalanges.  SURGEON:  Surgeon(s) and Role:    * Sharma Covert, MD - Primary  PHYSICIAN ASSISTANT: NONE  ASSISTANTS: none   ANESTHESIA:   general  EBL:  Total I/O In: 1100 [I.V.:1100] Out: -   BLOOD ADMINISTERED:none  DRAINS: none   LOCAL MEDICATIONS USED:  MARCAINE     SPECIMEN:  No Specimen  DISPOSITION OF SPECIMEN:  N/A  COUNTS:  YES  TOURNIQUET:   Total Tourniquet Time Documented: Upper Arm (Left) - 77 minutes  DICTATION: .Other Dictation: Dictation Number [id number]  PLAN OF CARE: Admit for overnight observation  PATIENT DISPOSITION:  PACU - hemodynamically stable.   Delay start of Pharmacological VTE agent (>24hrs) due to surgical blood loss or risk of bleeding: not applicable

## 2012-09-28 ENCOUNTER — Encounter (HOSPITAL_COMMUNITY): Payer: Self-pay | Admitting: Orthopedic Surgery

## 2014-04-07 ENCOUNTER — Ambulatory Visit (INDEPENDENT_AMBULATORY_CARE_PROVIDER_SITE_OTHER): Payer: Medicare Other | Admitting: Orthopedic Surgery

## 2014-04-07 DIAGNOSIS — M67919 Unspecified disorder of synovium and tendon, unspecified shoulder: Secondary | ICD-10-CM

## 2014-04-07 DIAGNOSIS — M719 Bursopathy, unspecified: Principal | ICD-10-CM

## 2014-04-07 NOTE — Patient Instructions (Signed)
Bursitis of the shoulder

## 2014-04-07 NOTE — Progress Notes (Signed)
Patient ID: Jorge West, male   DOB: 1946-07-14, 68 y.o.   MRN: [id number]  Chief Complaint  Patient presents with  . Shoulder Pain    Left shoulder pain for 2 months, no longer hurting     68 year old male with a new problem of pain in the left shoulder for 2 months, no trauma. Initially complained of painful for elevation no weakness pain with cold temperatures when it hits his shoulder. No loss of motion. Pain has now resolved  He has a history of hypertension depression no surgeries  Current medications aspirin fish oil vitamin D quinapril Terrazas and pravastatin diltiazem and bupropion. No medication allergies family history of alcoholism hypertension depression cancer  Review of systems positive findings include history depression history skin cancer vision problems cataracts difficulty sleeping   General the patient is well-developed and well-nourished grooming and hygiene are normal Oriented x3 Mood and affect normal Ambulation NORMAL, but noncontributory  Inspection of the left shoulder negative impingement sign Full range of motion All joints are stable Motor exam is normal Skin clean dry and intact  Cardiovascular exam is normal Sensory exam normal   Right shoulder full range of motion all joints are stable motor exam is normal skin is clean dry and intact pulses are normal and sensation is normal  I believe he had a case of bursitis which resolved. He did not want an injection. If his pain increases we can fit him in for a subacromial injection.  We did not take an x-ray today because there was no trauma and his symptoms had resolved.

## 2014-07-10 ENCOUNTER — Encounter: Payer: Self-pay | Admitting: Internal Medicine

## 2015-03-02 ENCOUNTER — Other Ambulatory Visit: Payer: Self-pay | Admitting: Dermatology

## 2015-04-02 ENCOUNTER — Other Ambulatory Visit: Payer: Self-pay | Admitting: Dermatology

## 2015-07-28 ENCOUNTER — Encounter: Payer: Self-pay | Admitting: Internal Medicine

## 2015-12-21 ENCOUNTER — Encounter: Payer: Self-pay | Admitting: Internal Medicine

## 2016-01-01 ENCOUNTER — Ambulatory Visit (AMBULATORY_SURGERY_CENTER): Payer: Medicare Other | Admitting: *Deleted

## 2016-01-01 VITALS — Ht 70.0 in | Wt 205.0 lb

## 2016-01-01 DIAGNOSIS — Z86010 Personal history of colonic polyps: Secondary | ICD-10-CM

## 2016-01-01 NOTE — Progress Notes (Signed)
No egg or soy allergy known to patient  No issues with past sedation with any surgeries  or procedures, no intubation problems  No diet pills per patient No home 02 use per patient  No blood thinners per patient  No issues with constipation

## 2016-01-15 ENCOUNTER — Encounter: Payer: Self-pay | Admitting: Internal Medicine

## 2016-01-15 ENCOUNTER — Ambulatory Visit (AMBULATORY_SURGERY_CENTER): Payer: Medicare Other | Admitting: Internal Medicine

## 2016-01-15 VITALS — BP 159/77 | HR 50 | Temp 98.6°F | Resp 15 | Ht 70.0 in | Wt 205.0 lb

## 2016-01-15 DIAGNOSIS — Z86010 Personal history of colonic polyps: Secondary | ICD-10-CM

## 2016-01-15 MED ORDER — SODIUM CHLORIDE 0.9 % IV SOLN
500.0000 mL | INTRAVENOUS | Status: DC
Start: 2016-01-15 — End: 2016-01-15

## 2016-01-15 NOTE — Op Note (Signed)
[place]  [address] [place], [id number]   COLONOSCOPY PROCEDURE REPORT  PATIENT: Jorge West, Jorge West  MR#: [id number] BIRTHDATE: 27-Mar-1946 , 69  yrs. old GENDER: male ENDOSCOPIST: Gatha Mayer, MD, [facility] PROCEDURE DATE:  01/15/2016 PROCEDURE:   Colonoscopy, surveillance First Screening Colonoscopy - Avg.  risk and is 50 yrs.  old or older - No.  Prior Negative Screening - Now for repeat screening. N/A  History of Adenoma - Now for follow-up colonoscopy & has been > or = to 3 yrs.  Yes hx of adenoma.  Has been 3 or more years since last colonoscopy.  Polyps removed today? No Recommend repeat exam, <10 yrs? Yes high risk ASA CLASS:   Class II INDICATIONS:Surveillance due to prior colonic neoplasia and PH Colon Adenoma. MEDICATIONS: Propofol 200 mg IV and Monitored anesthesia care  DESCRIPTION OF PROCEDURE:   After the risks benefits and alternatives of the procedure were thoroughly explained, informed consent was obtained.  The digital rectal exam revealed no abnormalities of the rectum, revealed no prostatic nodules, and revealed the prostate was not enlarged.   The LB [id number]-H190 [id number] endoscope was introduced through the anus and advanced to the cecum, which was identified by both the appendix and ileocecal valve. No adverse events experienced.   The quality of the prep was good.  (MiraLax was used)  The instrument was then slowly withdrawn as the colon was fully examined. Estimated blood loss is zero unless otherwise noted in this procedure report.      COLON FINDINGS: There was diverticulosis noted in the sigmoid colon. The examination was otherwise normal.  Retroflexed views revealed no abnormalities. The time to cecum = 4.4 Withdrawal time = 10.6 The scope was withdrawn and the procedure completed. COMPLICATIONS: There were no immediate complications.  ENDOSCOPIC IMPRESSION: 1.   Diverticulosis was noted in the sigmoid colon 2.   The  examination was otherwise normal - good prep - hx diminutive adenoma 2006 and 2 diminutive adenomas 2011  RECOMMENDATIONS: Repeat colonoscopy 7 years.  2024  eSigned:  Gatha Mayer, MD, [facility] 01/15/2016 9:25 AM   cc: Vicenta Aly, FNP and The Patient

## 2016-01-15 NOTE — Progress Notes (Signed)
Stable to RR 

## 2016-01-15 NOTE — Patient Instructions (Addendum)
No polyps today!  You do have diverticulosis - thickened muscle rings and pouches in the colon wall. Please read the handout about this condition.  You should consider repeating a colonoscopy in 7-8 years.  I appreciate the opportunity to care for you. Gatha Mayer, MD, FACG  YOU HAD AN ENDOSCOPIC PROCEDURE TODAY AT [facility]:   Refer to the procedure report that was given to you for any specific questions about what was found during the examination.  If the procedure report does not answer your questions, please call your gastroenterologist to clarify.  If you requested that your care partner not be given the details of your procedure findings, then the procedure report has been included in a sealed envelope for you to review at your convenience later.  YOU SHOULD EXPECT: Some feelings of bloating in the abdomen. Passage of more gas than usual.  Walking can help get rid of the air that was put into your GI tract during the procedure and reduce the bloating. If you had a lower endoscopy (such as a colonoscopy or flexible sigmoidoscopy) you may notice spotting of blood in your stool or on the toilet paper. If you underwent a bowel prep for your procedure, you may not have a normal bowel movement for a few days.  Please Note:  You might notice some irritation and congestion in your nose or some drainage.  This is from the oxygen used during your procedure.  There is no need for concern and it should clear up in a day or so.  SYMPTOMS TO REPORT IMMEDIATELY:   Following lower endoscopy (colonoscopy or flexible sigmoidoscopy):  Excessive amounts of blood in the stool  Significant tenderness or worsening of abdominal pains  Swelling of the abdomen that is new, acute  Fever of 100F or higher  For urgent or emergent issues, a gastroenterologist can be reached at any hour by calling [phone].   DIET: Your first meal following the procedure should be a small meal  and then it is ok to progress to your normal diet. Heavy or fried foods are harder to digest and may make you feel nauseous or bloated.  Likewise, meals heavy in dairy and vegetables can increase bloating.  Drink plenty of fluids but you should avoid alcoholic beverages for 24 hours.  ACTIVITY:  You should plan to take it easy for the rest of today and you should NOT DRIVE or use heavy machinery until tomorrow (because of the sedation medicines used during the test).    FOLLOW UP: Our staff will call the number listed on your records the next business day following your procedure to check on you and address any questions or concerns that you may have regarding the information given to you following your procedure. If we do not reach you, we will leave a message.  However, if you are feeling well and you are not experiencing any problems, there is no need to return our call.  We will assume that you have returned to your regular daily activities without incident.  If any biopsies were taken you will be contacted by phone or by letter within the next 1-3 weeks.  Please call us at [phone] if you have not heard about the biopsies in 3 weeks.    SIGNATURES/CONFIDENTIALITY: You and/or your care partner have signed paperwork which will be entered into your electronic medical record.  These signatures attest to the fact that that the information above on your After  Visit Summary has been reviewed and is understood.  Full responsibility of the confidentiality of this discharge information lies with you and/or your care-partner. 

## 2016-01-18 ENCOUNTER — Telehealth: Payer: Self-pay | Admitting: *Deleted

## 2016-01-18 NOTE — Telephone Encounter (Signed)
  Follow up Call-  Call back number 01/15/2016  Post procedure Call Back phone  # [phone]  Permission to leave phone message Yes     Patient questions:  Do you have a fever, pain , or abdominal swelling? No. Pain Score  0 *  Have you tolerated food without any problems? Yes.    Have you been able to return to your normal activities? Yes.    Do you have any questions about your discharge instructions: Diet   No. Medications  No. Follow up visit  No.  Do you have questions or concerns about your Care? No.  Actions: * If pain score is 4 or above: No action needed, pain <4.

## 2016-03-10 ENCOUNTER — Ambulatory Visit (INDEPENDENT_AMBULATORY_CARE_PROVIDER_SITE_OTHER): Payer: Medicare Other

## 2016-03-10 ENCOUNTER — Ambulatory Visit (INDEPENDENT_AMBULATORY_CARE_PROVIDER_SITE_OTHER): Payer: Medicare Other | Admitting: Orthopedic Surgery

## 2016-03-10 VITALS — BP 162/83 | HR 64 | Ht 70.0 in | Wt 210.6 lb

## 2016-03-10 DIAGNOSIS — M25561 Pain in right knee: Secondary | ICD-10-CM

## 2016-03-10 NOTE — Progress Notes (Signed)
Patient ID: Jorge West, male   DOB: [birth year], 70 y.o.   MRN: [id number]  Chief Complaint  Patient presents with  . Knee Pain    Right knee pain   New problem last seen 04/07/2014 HPI Jorge West is a 70 y.o. male.  Presents for evaluation of his right knee apparently 2 weeks ago he was having buckling episodes which have now resolved  Details Injury none Symptoms of pain, catching Stabbing pain 5 out of 10 intensity No prior treatment  Review of Systems Review of Systems  Genitourinary: Positive for frequency and enuresis.  Musculoskeletal: Positive for arthralgias.  Psychiatric/Behavioral:       Depression    Past Medical History  Diagnosis Date  . Hypertension   . Depression   . Nocturia   . Cataract     left and removed  . Personal history of colonic polyps 04/19/2005    Past Surgical History  Procedure Laterality Date  . Surgery for skin cancer    . Cataract extraction    . Open reduction internal fixation (orif) metacarpal  09/24/2012    Procedure: OPEN REDUCTION INTERNAL FIXATION (ORIF) METACARPAL;  Surgeon: Linna Hoff, MD;  Location: [place];  Service: Orthopedics;  Laterality: Left;  and proximal phalanges.  . Colonoscopy      Family History  Problem Relation Age of Onset  . Cancer      family history   . Colon cancer Neg Hx   . Colon polyps Neg Hx   . Rectal cancer Neg Hx   . Stomach cancer Neg Hx     Social History Social History  Substance Use Topics  . Smoking status: Never Smoker   . Smokeless tobacco: Former Systems developer     Comment: 10-15 yeras quit   . Alcohol Use: No    Allergies  Allergen Reactions  . Terazosin Hcl     Dizziness and hypotension    Current Outpatient Prescriptions  Medication Sig Dispense Refill  . acetaminophen (ACETAMIN) 500 MG tablet Take 500 mg by mouth every 6 (six) hours as needed. Reported on 01/01/2016    . ALPRAZolam (XANAX) 0.5 MG tablet Take 0.5 mg by mouth 2 (two) times daily as needed for  anxiety.    Marland Kitchen aspirin EC 81 MG tablet Take 81 mg by mouth daily.    Marland Kitchen buPROPion (WELLBUTRIN XL) 300 MG 24 hr tablet Take 300 mg by mouth daily.    . Cholecalciferol (VITAMIN D3) 3000 UNITS TABS Take 1 tablet by mouth daily.     Marland Kitchen diltiazem (TIAZAC) 420 MG 24 hr capsule Take 420 mg by mouth daily.    . Omega-3 Fatty Acids (FISH OIL PO) Take 1 capsule by mouth daily.     . pravastatin (PRAVACHOL) 40 MG tablet Take 40 mg by mouth daily.    . quinapril (ACCUPRIL) 40 MG tablet Take 40 mg by mouth.    . ranitidine (ZANTAC) 150 MG tablet Take 150 mg by mouth.    . terazosin (HYTRIN) 1 MG capsule Take 1 mg by mouth.    . vitamin C (ASCORBIC ACID) 500 MG tablet Take 1 tablet (500 mg total) by mouth daily. 90 tablet 0   No current facility-administered medications for this visit.    Physical Exam Physical Exam Blood pressure 162/83, pulse 64, height 5\' 10"  (1.778 m), weight 210 lb 9.6 oz (95.528 kg). Appearance, there are no abnormalities in terms of appearance the patient was well-developed and well-nourished. The grooming and  hygiene were normal.  Mental status orientation, there was normal alertness and orientation Mood pleasant Ambulatory status normal with no assistive devices  Examination of the Right knee Inspection  Medial joint line tenderness , no effusion. Range of motion normal Tests for stability cruciate ligaments collateral ligaments stable Motor strength  normal muscle tone and grade 5 strength  Negative for McMurray sign for medial meniscus tear  Skin warm dry and intact without laceration or ulceration or erythema Neurologic examination normal sensation Vascular examination normal pulses with warm extremity and normal capillary refill  The opposite knee reveals no swelling or effusion, full range of motion, normal muscle tone, anterior and posterior drawer test stable neurovascular exam intact  Data Reviewed I ordered an x-ray I interpreted that 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

## 2016-05-17 DIAGNOSIS — N529 Male erectile dysfunction, unspecified: Secondary | ICD-10-CM | POA: Insufficient documentation

## 2017-03-01 ENCOUNTER — Other Ambulatory Visit: Payer: Self-pay | Admitting: Dermatology

## 2018-02-07 ENCOUNTER — Other Ambulatory Visit: Payer: Self-pay | Admitting: Dermatology

## 2018-03-07 ENCOUNTER — Other Ambulatory Visit: Payer: Self-pay | Admitting: Dermatology

## 2018-04-19 ENCOUNTER — Other Ambulatory Visit: Payer: Self-pay | Admitting: Dermatology

## 2018-10-23 ENCOUNTER — Other Ambulatory Visit: Payer: Self-pay | Admitting: Dermatology

## 2019-06-02 ENCOUNTER — Emergency Department (HOSPITAL_COMMUNITY): Payer: Medicare Other

## 2019-06-02 ENCOUNTER — Observation Stay (HOSPITAL_BASED_OUTPATIENT_CLINIC_OR_DEPARTMENT_OTHER): Payer: Medicare Other

## 2019-06-02 ENCOUNTER — Encounter (HOSPITAL_COMMUNITY): Payer: Self-pay | Admitting: Emergency Medicine

## 2019-06-02 ENCOUNTER — Observation Stay (HOSPITAL_COMMUNITY)
Admission: EM | Admit: 2019-06-02 | Discharge: 2019-06-03 | Disposition: A | Discharge status: Home / Self Care | Payer: Medicare Other | Attending: Family Medicine | Admitting: Family Medicine

## 2019-06-02 ENCOUNTER — Other Ambulatory Visit: Payer: Self-pay

## 2019-06-02 DIAGNOSIS — F329 Major depressive disorder, single episode, unspecified: Secondary | ICD-10-CM | POA: Diagnosis not present

## 2019-06-02 DIAGNOSIS — N401 Enlarged prostate with lower urinary tract symptoms: Secondary | ICD-10-CM | POA: Diagnosis not present

## 2019-06-02 DIAGNOSIS — Z7982 Long term (current) use of aspirin: Secondary | ICD-10-CM | POA: Insufficient documentation

## 2019-06-02 DIAGNOSIS — I110 Hypertensive heart disease with heart failure: Secondary | ICD-10-CM | POA: Insufficient documentation

## 2019-06-02 DIAGNOSIS — R9431 Abnormal electrocardiogram [ECG] [EKG]: Secondary | ICD-10-CM

## 2019-06-02 DIAGNOSIS — Z79899 Other long term (current) drug therapy: Secondary | ICD-10-CM | POA: Diagnosis not present

## 2019-06-02 DIAGNOSIS — Z1159 Encounter for screening for other viral diseases: Secondary | ICD-10-CM | POA: Diagnosis not present

## 2019-06-02 DIAGNOSIS — N529 Male erectile dysfunction, unspecified: Secondary | ICD-10-CM | POA: Insufficient documentation

## 2019-06-02 DIAGNOSIS — F419 Anxiety disorder, unspecified: Secondary | ICD-10-CM | POA: Insufficient documentation

## 2019-06-02 DIAGNOSIS — E785 Hyperlipidemia, unspecified: Secondary | ICD-10-CM | POA: Insufficient documentation

## 2019-06-02 DIAGNOSIS — I480 Paroxysmal atrial fibrillation: Secondary | ICD-10-CM | POA: Diagnosis present

## 2019-06-02 DIAGNOSIS — R072 Precordial pain: Principal | ICD-10-CM | POA: Insufficient documentation

## 2019-06-02 DIAGNOSIS — I4891 Unspecified atrial fibrillation: Secondary | ICD-10-CM | POA: Insufficient documentation

## 2019-06-02 DIAGNOSIS — I5032 Chronic diastolic (congestive) heart failure: Secondary | ICD-10-CM | POA: Insufficient documentation

## 2019-06-02 DIAGNOSIS — K5790 Diverticulosis of intestine, part unspecified, without perforation or abscess without bleeding: Secondary | ICD-10-CM | POA: Diagnosis not present

## 2019-06-02 DIAGNOSIS — R079 Chest pain, unspecified: Secondary | ICD-10-CM | POA: Diagnosis present

## 2019-06-02 DIAGNOSIS — I10 Essential (primary) hypertension: Secondary | ICD-10-CM | POA: Diagnosis present

## 2019-06-02 HISTORY — DX: Benign prostatic hyperplasia without lower urinary tract symptoms: N40.0

## 2019-06-02 HISTORY — DX: Male erectile dysfunction, unspecified: N52.9

## 2019-06-02 HISTORY — DX: Hyperlipidemia, unspecified: E78.5

## 2019-06-02 HISTORY — DX: Diverticulosis of intestine, part unspecified, without perforation or abscess without bleeding: K57.90

## 2019-06-02 HISTORY — DX: Impaired fasting glucose: R73.01

## 2019-06-02 LAB — CBC WITH DIFFERENTIAL/PLATELET
Abs Immature Granulocytes: 0.01 10*3/uL (ref 0.00–0.07)
Basophils Absolute: 0.1 10*3/uL (ref 0.0–0.1)
Basophils Relative: 1 %
Eosinophils Absolute: 0.2 10*3/uL (ref 0.0–0.5)
Eosinophils Relative: 3 %
HCT: 45.5 % (ref 39.0–52.0)
Hemoglobin: 15.6 g/dL (ref 13.0–17.0)
Immature Granulocytes: 0 %
Lymphocytes Relative: 29 %
Lymphs Abs: 1.7 10*3/uL (ref 0.7–4.0)
MCH: 30.1 pg (ref 26.0–34.0)
MCHC: 34.3 g/dL (ref 30.0–36.0)
MCV: 87.7 fL (ref 80.0–100.0)
Monocytes Absolute: 0.5 10*3/uL (ref 0.1–1.0)
Monocytes Relative: 9 %
Neutro Abs: 3.4 10*3/uL (ref 1.7–7.7)
Neutrophils Relative %: 58 %
Platelets: 262 10*3/uL (ref 150–400)
RBC: 5.19 MIL/uL (ref 4.22–5.81)
RDW: 12.6 % (ref 11.5–15.5)
WBC: 5.9 10*3/uL (ref 4.0–10.5)
nRBC: 0 % (ref 0.0–0.2)

## 2019-06-02 LAB — COMPREHENSIVE METABOLIC PANEL
ALT: 14 U/L (ref 0–44)
AST: 21 U/L (ref 15–41)
Albumin: 4 g/dL (ref 3.5–5.0)
Alkaline Phosphatase: 74 U/L (ref 38–126)
Anion gap: 8 (ref 5–15)
BUN: 21 mg/dL (ref 8–23)
CO2: 26 mmol/L (ref 22–32)
Calcium: 8.4 mg/dL — ABNORMAL LOW (ref 8.9–10.3)
Chloride: 106 mmol/L (ref 98–111)
Creatinine, Ser: 1.22 mg/dL (ref 0.61–1.24)
GFR calc Af Amer: >60 mL/min (ref >60)
GFR calc non Af Amer: 58 mL/min — ABNORMAL LOW (ref >60)
Glucose, Bld: 110 mg/dL — ABNORMAL HIGH (ref 70–99)
Potassium: 3.5 mmol/L (ref 3.5–5.1)
Sodium: 140 mmol/L (ref 135–145)
Total Bilirubin: 0.5 mg/dL (ref 0.3–1.2)
Total Protein: 7.6 g/dL (ref 6.5–8.1)

## 2019-06-02 LAB — TROPONIN I (HIGH SENSITIVITY)
Troponin I (High Sensitivity): 9 ng/L (ref <18)
Troponin I (High Sensitivity): 15 ng/L (ref <18)

## 2019-06-02 LAB — ECHOCARDIOGRAM COMPLETE
Height: 70 in
Weight: 3296 oz

## 2019-06-02 LAB — SARS CORONAVIRUS 2 BY RT PCR (HOSPITAL ORDER, PERFORMED IN ~~LOC~~ HOSPITAL LAB): SARS Coronavirus 2: NEGATIVE

## 2019-06-02 LAB — MRSA PCR SCREENING: MRSA by PCR: NEGATIVE

## 2019-06-02 LAB — HEPARIN LEVEL (UNFRACTIONATED): Heparin Unfractionated: 0.67 IU/mL (ref 0.30–0.70)

## 2019-06-02 LAB — TSH: TSH: 1.318 u[IU]/mL (ref 0.350–4.500)

## 2019-06-02 LAB — MAGNESIUM: Magnesium: 2.5 mg/dL — ABNORMAL HIGH (ref 1.7–2.4)

## 2019-06-02 LAB — PROTIME-INR
INR: 1 (ref 0.8–1.2)
Prothrombin Time: 13.5 seconds (ref 11.4–15.2)

## 2019-06-02 MED ORDER — VITAMIN C 500 MG PO TABS
500.0000 mg | ORAL_TABLET | Freq: Every day | ORAL | Status: DC
  Administered 2019-06-02 – 2019-06-03 (×2): 500 mg via ORAL
  Filled 2019-06-02 (×2): qty 1

## 2019-06-02 MED ORDER — FAMOTIDINE 20 MG PO TABS
20.0000 mg | ORAL_TABLET | Freq: Two times a day (BID) | ORAL | Status: DC
  Administered 2019-06-02 – 2019-06-03 (×2): 20 mg via ORAL
  Filled 2019-06-02 (×2): qty 1

## 2019-06-02 MED ORDER — IRBESARTAN 150 MG PO TABS
150.0000 mg | ORAL_TABLET | Freq: Every day | ORAL | Status: DC
  Administered 2019-06-02 – 2019-06-03 (×2): 150 mg via ORAL
  Filled 2019-06-02 (×2): qty 1

## 2019-06-02 MED ORDER — VITAMIN D 25 MCG (1000 UNIT) PO TABS
1000.0000 [IU] | ORAL_TABLET | Freq: Every day | ORAL | Status: DC
  Administered 2019-06-03: 1000 [IU] via ORAL
  Filled 2019-06-02: qty 1

## 2019-06-02 MED ORDER — OMEGA-3-ACID ETHYL ESTERS 1 G PO CAPS
1.0000 g | ORAL_CAPSULE | Freq: Every day | ORAL | Status: DC
  Administered 2019-06-02 – 2019-06-03 (×2): 1 g via ORAL
  Filled 2019-06-02 (×2): qty 1

## 2019-06-02 MED ORDER — LABETALOL HCL 5 MG/ML IV SOLN: 10.0000 mg | INTRAVENOUS | Status: DC | PRN

## 2019-06-02 MED ORDER — TRAZODONE HCL 50 MG PO TABS: 50.0000 mg | ORAL_TABLET | Freq: Every evening | ORAL | Status: DC | PRN

## 2019-06-02 MED ORDER — LATANOPROST 0.005 % OP SOLN
1.0000 [drp] | Freq: Every day | OPHTHALMIC | Status: DC
  Administered 2019-06-02: 1 [drp] via OPHTHALMIC
  Filled 2019-06-02 (×2): qty 2.5

## 2019-06-02 MED ORDER — ONDANSETRON HCL 4 MG/2ML IJ SOLN: 4.0000 mg | Freq: Four times a day (QID) | INTRAMUSCULAR | Status: DC | PRN

## 2019-06-02 MED ORDER — BUPROPION HCL ER (XL) 150 MG PO TB24
300.0000 mg | ORAL_TABLET | Freq: Every day | ORAL | Status: DC
  Filled 2019-06-02 (×2): qty 1

## 2019-06-02 MED ORDER — TRAZODONE HCL 50 MG PO TABS
100.0000 mg | ORAL_TABLET | Freq: Every day | ORAL | Status: DC
  Administered 2019-06-02: 100 mg via ORAL
  Filled 2019-06-02: qty 2

## 2019-06-02 MED ORDER — POTASSIUM CHLORIDE CRYS ER 20 MEQ PO TBCR
40.0000 meq | EXTENDED_RELEASE_TABLET | Freq: Once | ORAL | Status: AC
  Administered 2019-06-02: 40 meq via ORAL
  Filled 2019-06-02: qty 2

## 2019-06-02 MED ORDER — ACETAMINOPHEN 325 MG PO TABS: 650.0000 mg | ORAL_TABLET | Freq: Four times a day (QID) | ORAL | Status: DC | PRN

## 2019-06-02 MED ORDER — ALPRAZOLAM 0.5 MG PO TABS: 0.5000 mg | ORAL_TABLET | Freq: Two times a day (BID) | ORAL | Status: DC | PRN

## 2019-06-02 MED ORDER — ACETAMINOPHEN 650 MG RE SUPP: 650.0000 mg | Freq: Four times a day (QID) | RECTAL | Status: DC | PRN

## 2019-06-02 MED ORDER — ASPIRIN EC 81 MG PO TBEC
81.0000 mg | DELAYED_RELEASE_TABLET | Freq: Every day | ORAL | Status: DC
  Administered 2019-06-02 – 2019-06-03 (×2): 81 mg via ORAL
  Filled 2019-06-02 (×2): qty 1

## 2019-06-02 MED ORDER — ALBUTEROL SULFATE (2.5 MG/3ML) 0.083% IN NEBU: 2.5000 mg | INHALATION_SOLUTION | RESPIRATORY_TRACT | Status: DC | PRN

## 2019-06-02 MED ORDER — DILTIAZEM LOAD VIA INFUSION
10.0000 mg | Freq: Once | INTRAVENOUS | Status: AC
  Administered 2019-06-02: 10 mg via INTRAVENOUS
  Filled 2019-06-02: qty 10

## 2019-06-02 MED ORDER — BRIMONIDINE TARTRATE-TIMOLOL 0.2-0.5 % OP SOLN
1.0000 [drp] | Freq: Every day | OPHTHALMIC | Status: DC
  Filled 2019-06-02: qty 5

## 2019-06-02 MED ORDER — SODIUM CHLORIDE 0.9% FLUSH: 3.0000 mL | Freq: Two times a day (BID) | INTRAVENOUS | Status: DC

## 2019-06-02 MED ORDER — SODIUM CHLORIDE 0.9 % IV SOLN
INTRAVENOUS | Status: DC
  Administered 2019-06-02: 18:00:00 via INTRAVENOUS

## 2019-06-02 MED ORDER — ONDANSETRON HCL 4 MG PO TABS: 4.0000 mg | ORAL_TABLET | Freq: Four times a day (QID) | ORAL | Status: DC | PRN

## 2019-06-02 MED ORDER — ACETAMINOPHEN 500 MG PO TABS: 500.0000 mg | ORAL_TABLET | Freq: Four times a day (QID) | ORAL | Status: DC | PRN

## 2019-06-02 MED ORDER — DILTIAZEM HCL 100 MG IV SOLR
5.0000 mg/h | INTRAVENOUS | Status: DC
  Administered 2019-06-02: 5 mg/h via INTRAVENOUS
  Filled 2019-06-02 (×3): qty 100

## 2019-06-02 MED ORDER — SODIUM CHLORIDE 0.9 % IV SOLN: 250.0000 mL | INTRAVENOUS | Status: DC | PRN

## 2019-06-02 MED ORDER — HEPARIN BOLUS VIA INFUSION
4000.0000 [IU] | Freq: Once | INTRAVENOUS | Status: AC
  Administered 2019-06-02: 4000 [IU] via INTRAVENOUS

## 2019-06-02 MED ORDER — SODIUM CHLORIDE 0.9% FLUSH: 3.0000 mL | INTRAVENOUS | Status: DC | PRN

## 2019-06-02 MED ORDER — TERAZOSIN HCL 1 MG PO CAPS
1.0000 mg | ORAL_CAPSULE | Freq: Every day | ORAL | Status: DC
  Administered 2019-06-02: 1 mg via ORAL
  Filled 2019-06-02: qty 1

## 2019-06-02 MED ORDER — HEPARIN (PORCINE) 25000 UT/250ML-% IV SOLN
1150.0000 [IU]/h | INTRAVENOUS | Status: DC
  Administered 2019-06-02: 1400 [IU]/h via INTRAVENOUS
  Filled 2019-06-02 (×2): qty 250

## 2019-06-02 MED ORDER — PRAVASTATIN SODIUM 40 MG PO TABS
40.0000 mg | ORAL_TABLET | Freq: Every day | ORAL | Status: DC
  Administered 2019-06-02: 40 mg via ORAL
  Filled 2019-06-02: qty 1

## 2019-06-02 MED ORDER — ESCITALOPRAM OXALATE 10 MG PO TABS
10.0000 mg | ORAL_TABLET | Freq: Every day | ORAL | Status: DC
  Administered 2019-06-02 – 2019-06-03 (×2): 10 mg via ORAL
  Filled 2019-06-02 (×2): qty 1

## 2019-06-02 MED ORDER — POLYETHYLENE GLYCOL 3350 17 G PO PACK: 17.0000 g | PACK | Freq: Every day | ORAL | Status: DC | PRN

## 2019-06-02 NOTE — Progress Notes (Signed)
ANTICOAGULATION CONSULT NOTE - Initial Consult  Pharmacy Consult for heparin gtt  Indication: atrial fibrillation  Allergies  Allergen Reactions  . Terazosin Hcl     Dizziness and hypotension    Patient Measurements: Height: 5\' 10"  (177.8 cm) Weight: 203 lb 7.8 oz (92.3 kg) IBW/kg (Calculated) : 73 Heparin Dosing Weight: HEPARIN DW (KG): 91.6   Vital Signs: Temp: 97.9 F (36.6 C) (07/12 1828) Temp Source: Oral (07/12 1828) BP: 151/94 (07/12 1900) Pulse Rate: 82 (07/12 1900)  Labs: Recent Labs    06/02/19 1046 06/02/19 1845  HGB 15.6  --   HCT 45.5  --   PLT 262  --   LABPROT 13.5  --   INR 1.0  --   HEPARINUNFRC  --  0.67  CREATININE 1.22  --     Estimated Creatinine Clearance: 61.6 mL/min (by C-G formula based on SCr of 1.22 mg/dL).   Medical History: Past Medical History:  Diagnosis Date  . Benign prostate hyperplasia   . Cataract    left and removed  . Depression   . Depression   . Diverticulosis   . Erectile dysfunction   . Hyperlipidemia   . Hypertension   . Impaired fasting glucose   . Nocturia   . Personal history of colonic polyps 04/19/2005    Medications:  Medications Prior to Admission  Medication Sig Dispense Refill Last Dose  . acetaminophen (ACETAMIN) 500 MG tablet Take 500 mg by mouth every 6 (six) hours as needed. Reported on 01/01/2016     . ALPRAZolam (XANAX) 0.5 MG tablet Take 0.5 mg by mouth 2 (two) times daily as needed for anxiety.   06/01/2019 at Unknown time  . aspirin EC 81 MG tablet Take 81 mg by mouth daily.   06/01/2019 at 900  . brimonidine-timolol (COMBIGAN) 0.2-0.5 % ophthalmic solution Place 1 drop into the left eye daily.     Marland Kitchen buPROPion (WELLBUTRIN XL) 300 MG 24 hr tablet Take 300 mg by mouth daily.   06/01/2019 at Unknown time  . Cholecalciferol (VITAMIN D3) 3000 UNITS TABS Take 1 tablet by mouth daily.    06/01/2019 at Unknown time  . escitalopram (LEXAPRO) 10 MG tablet Take 10 mg by mouth daily.   06/01/2019 at Unknown  time  . latanoprost (XALATAN) 0.005 % ophthalmic solution Place 1 drop into the left eye at bedtime.     . Omega-3 Fatty Acids (FISH OIL PO) Take 1 capsule by mouth daily.    06/01/2019 at Unknown time  . pravastatin (PRAVACHOL) 40 MG tablet Take 40 mg by mouth daily.   06/01/2019 at Unknown time  . ranitidine (ZANTAC) 150 MG tablet Take 150 mg by mouth.   06/01/2019 at Unknown time  . terazosin (HYTRIN) 1 MG capsule Take 1 mg by mouth.   06/01/2019 at Unknown time  . valsartan (DIOVAN) 320 MG tablet Take 320 mg by mouth daily.   06/01/2019 at Unknown time  . vitamin C (ASCORBIC ACID) 500 MG tablet Take 1 tablet (500 mg total) by mouth daily. 90 tablet 0 06/01/2019 at Unknown time   Scheduled:  . aspirin EC  81 mg Oral Daily  . brimonidine-timolol  1 drop Left Eye Daily  . buPROPion  300 mg Oral Daily  . escitalopram  10 mg Oral Daily  . famotidine  20 mg Oral BID  . irbesartan  150 mg Oral Daily  . latanoprost  1 drop Left Eye QHS  . omega-3 acid ethyl esters  1 g  Oral Daily  . pravastatin  40 mg Oral Daily  . sodium chloride flush  3 mL Intravenous Q12H  . terazosin  1 mg Oral QHS  . traZODone  100 mg Oral QHS  . vitamin C  500 mg Oral Daily  . Vitamin D3  1 tablet Oral Daily   Infusions:  . sodium chloride    . sodium chloride 50 mL/hr at 06/02/19 1742  . diltiazem (CARDIZEM) infusion 5 mg/hr (06/02/19 1105)  . heparin 1,400 Units/hr (06/02/19 1137)   PRN:  Anti-infectives (From admission, onward)   None      Assessment: Jorge West a 73 y.o. male requires anticoagulation with a heparin iv infusion for the indication of  atrial fibrillation. Heparin gtt will be started following pharmacy protocol per pharmacy consult. Patient is not on previous oral anticoagulant that will require aPTT/HL correlation before transitioning to only HL monitoring.    HL 0.67 therapeutic  Goal of Therapy:  Heparin level 0.3-0.7 units/ml Monitor platelets by anticoagulation protocol: Yes    Plan:  Reduce heparin infusion to 1300 units/hr Check anti-Xa level in daily while on heparin Continue to monitor H&H and platelets   [name] [name] 06/02/2019,7:16 PM

## 2019-06-02 NOTE — H&P (Addendum)
Patient Demographics:    Jorge West, is a 73 y.o. male  MRN: [id number]   DOB - Jul 09, 1946  Admit Date - 06/02/2019  Outpatient Primary MD for the patient is Vicenta Aly, FNP   Assessment & Plan:    Principal Problem:   New onset a-fib [facility]) Active Problems:   HTN (hypertension)   Chest pain in adult   HLD (hyperlipidemia)   Chronic diastolic CHF (congestive heart failure) (HCC)/EF 60 to 65 %    1)New Onset Afib-with RVR--responded well to IV Cardizem, continue IV Cardizem for now plan to switch to oral Cardizem in a.m. given preserved EF,  CHA2DS2- VASc score   is = 3 (Age, HTN, dCHF)    Which is  equal to = 3.2 % annual risk of stroke  --This patients CHA2DS2-VASc Score and unadjusted Ischemic Stroke Rate (% per year) is equal to 3.2 % stroke rate/year from a score of 3  C/n IV Heparin  2)HFpEF-Echo shows dCHF with EF of 60 to 65% and no significant wall motion normalities--- no acute CHF exacerbation at this time, currently on Avapro  3)Depression/Anxiety--- stable, continue Wellbutrin, continue Lexapro, may use Xanax as needed for anxiety, may use trazodone as needed for sleep  4)HTN--- stable, continue Avapro,  may use IV labetalol when necessary  Every 4 hours for systolic blood pressure over 160 mmhg  5)BPH with LUTs--stable, continue Hytrin  6) atypical chest Pain--serial troponins negative, echocardiogram without wall motion abnormalities, suspect some degree of demand ischemia in the setting of A. fib with RVR/tachycardia  With History of - Reviewed by me  Past Medical History:  Diagnosis Date  . Benign prostate hyperplasia   . Cataract    left and removed  . Depression   . Depression   . Diverticulosis   . Erectile dysfunction   . Hyperlipidemia   . Hypertension   . Impaired  fasting glucose   . Nocturia   . Personal history of colonic polyps 04/19/2005      Past Surgical History:  Procedure Laterality Date  . CATARACT EXTRACTION    . COLONOSCOPY    . OPEN REDUCTION INTERNAL FIXATION (ORIF) METACARPAL  09/24/2012   Procedure: OPEN REDUCTION INTERNAL FIXATION (ORIF) METACARPAL;  Surgeon: Linna Hoff, MD;  Location: [place];  Service: Orthopedics;  Laterality: Left;  and proximal phalanges.  . surgery for skin cancer        Chief Complaint  Patient presents with  . Palpitations      HPI:    Jorge West  is a 73 y.o. male with past medical history relevant for HTN, HLD,  dCHF (EF 60 to 65%) and prior history of NSVT usually followed by [facility] in [facility] cardiology services --- presents to the ED with chest discomfort  -Chest discomfort was retrosternal, noticed while he was in his tall walking around, resolved with rest --- He actually denies palpitations dizziness or presyncopal type symptoms --- No  leg pains or leg swelling or pleuritic symptoms   He had an ambulatory monitor in February of this year for bradycardia which showed SVT and a brief run of NSVT  In ED--- is found to be in A. fib with RVR with heart rates in the 140s, desponded well to IV Cardizem In ED--TSH is 1.3, magnesium is 2.5, troponin is 15 and potassium is 3.5 --EKG consistent with A. fib with RVR -EDP started patient on IV heparin --Echo shows dCHF with EF of 60 to 65% and no significant wall motion normalities   Review of systems:    In addition to the HPI above,   A full Review of  Systems was done, all other systems reviewed are negative except as noted above in HPI , .    Social History:  Reviewed by me    Social History   Tobacco Use  . Smoking status: Never Smoker  . Smokeless tobacco: Former Systems developer  . Tobacco comment: 10-15 yeras quit   Substance Use Topics  . Alcohol use: No    Alcohol/week: 0.0 standard drinks       Family History :   Reviewed by me    Family History  Problem Relation Age of Onset  . Cancer Other        family history   . Colon cancer Neg Hx   . Colon polyps Neg Hx   . Rectal cancer Neg Hx   . Stomach cancer Neg Hx      Home Medications:   Prior to Admission medications   Medication Sig Start Date End Date Taking? Authorizing Provider  acetaminophen (ACETAMIN) 500 MG tablet Take 500 mg by mouth every 6 (six) hours as needed. Reported on 01/01/2016   Yes [provider]  ALPRAZolam Duanne Moron) 0.5 MG tablet Take 0.5 mg by mouth 2 (two) times daily as needed for anxiety.   Yes [provider]  aspirin EC 81 MG tablet Take 81 mg by mouth daily.   Yes [provider]  brimonidine-timolol (COMBIGAN) 0.2-0.5 % ophthalmic solution Place 1 drop into the left eye daily. 02/29/16  Yes [provider]  buPROPion (WELLBUTRIN XL) 300 MG 24 hr tablet Take 300 mg by mouth daily.   Yes [provider]  Cholecalciferol (VITAMIN D3) 3000 UNITS TABS Take 1 tablet by mouth daily.    Yes [provider]  escitalopram (LEXAPRO) 10 MG tablet Take 10 mg by mouth daily.   Yes [provider]  latanoprost (XALATAN) 0.005 % ophthalmic solution Place 1 drop into the left eye at bedtime. 03/15/16  Yes [provider]  Omega-3 Fatty Acids (FISH OIL PO) Take 1 capsule by mouth daily.    Yes [provider]  pravastatin (PRAVACHOL) 40 MG tablet Take 40 mg by mouth daily.   Yes [provider]  ranitidine (ZANTAC) 150 MG tablet Take 150 mg by mouth.   Yes [provider]  terazosin (HYTRIN) 1 MG capsule Take 1 mg by mouth.   Yes [provider]  valsartan (DIOVAN) 320 MG tablet Take 320 mg by mouth daily.   Yes [provider]  vitamin C (ASCORBIC ACID) 500 MG tablet Take 1 tablet (500 mg total) by mouth daily. 09/25/12  Yes Iran Planas, MD     Allergies:     Allergies  Allergen Reactions  . Terazosin Hcl      Dizziness and hypotension     Physical Exam:   Vitals  Blood pressure Marland Kitchen)  133/99, pulse 60, temperature 97.9 F (36.6 C), temperature source Oral, resp. rate 19, height 5\' 10"  (1.778 m), weight 93.4 kg, SpO2 96 %.  Physical Examination: General appearance - alert, well appearing, and in no distress and  Mental status - alert, oriented to person, place, and time,  Eyes - sclera anicteric Neck - supple, no JVD elevation , Chest - clear  to auscultation bilaterally, symmetrical air movement,  Heart - S1 and S2 normal, irregularly irregular heart rate down to 60 from 140s   abdomen - soft, nontender, nondistended, no masses or organomegaly Neurological - screening mental status exam normal, neck supple without rigidity, cranial nerves II through XII intact, DTR's normal and symmetric Extremities - no pedal edema noted, intact peripheral pulses  Skin - warm, dry   Data Review:    CBC Recent Labs  Lab 06/02/19 1046  WBC 5.9  HGB 15.6  HCT 45.5  PLT 262  MCV 87.7  MCH 30.1  MCHC 34.3  RDW 12.6  LYMPHSABS 1.7  MONOABS 0.5  EOSABS 0.2  BASOSABS 0.1   ------------------------------------------------------------------------------------------------------------------  Chemistries  Recent Labs  Lab 06/02/19 1046  NA 140  K 3.5  CL 106  CO2 26  GLUCOSE 110*  BUN 21  CREATININE 1.22  CALCIUM 8.4*  MG 2.5*  AST 21  ALT 14  ALKPHOS 74  BILITOT 0.5   ------------------------------------------------------------------------------------------------------------------ estimated creatinine clearance is 61.9 mL/min (by C-G formula based on SCr of 1.22 mg/dL). ------------------------------------------------------------------------------------------------------------------ Recent Labs    06/02/19 1046  TSH 1.318     Coagulation profile Recent Labs  Lab 06/02/19 1046  INR 1.0    ------------------------------------------------------------------------------------------------------------------- No results for input(s): DDIMER in the last 72 hours. -------------------------------------------------------------------------------------------------------------------  Cardiac Enzymes No results for input(s): CKMB, TROPONINI, MYOGLOBIN in the last 168 hours.  Invalid input(s): CK ------------------------------------------------------------------------------------------------------------------ No results found for: BNP   Urinalysis No results found for: COLORURINE, APPEARANCEUR, LABSPEC, PHURINE, GLUCOSEU, HGBUR, BILIRUBINUR, [place], PROTEINUR, UROBILINOGEN, NITRITE, LEUKOCYTESUR    Imaging Results:    Dg Chest Portable 1 View  Result Date: 06/02/2019 CLINICAL DATA:  Chest pain and cardiac palpitations EXAM: PORTABLE CHEST 1 VIEW COMPARISON:  September 24, 2012 FINDINGS: There is no appreciable edema or consolidation. The heart is enlarged with pulmonary vascularity normal. No adenopathy. No bone lesions. No pneumothorax. IMPRESSION: Cardiomegaly.  No edema or consolidation. Electronically Signed   By: Lowella Grip III M.D.   On: 06/02/2019 11:31    Radiological Exams on Admission: Dg Chest Portable 1 View  Result Date: 06/02/2019 CLINICAL DATA:  Chest pain and cardiac palpitations EXAM: PORTABLE CHEST 1 VIEW COMPARISON:  September 24, 2012 FINDINGS: There is no appreciable edema or consolidation. The heart is enlarged with pulmonary vascularity normal. No adenopathy. No bone lesions. No pneumothorax. IMPRESSION: Cardiomegaly.  No edema or consolidation. Electronically Signed   By: Lowella Grip III M.D.   On: 06/02/2019 11:31    DVT Prophylaxis -SCD/iv Heparin AM Labs Ordered, also please review Full Orders  Family Communication: Admission, patients condition and plan of care including tests being ordered have been discussed with the patient who indicate  understanding and agree with the plan   Code Status - Full Code  Likely DC to  home  Condition   -stable  Roxan Hockey M.D on 06/02/2019 at 4:26 PM Go to [email or web address] -  for contact info  [facility] - Office  [phone]

## 2019-06-02 NOTE — ED Triage Notes (Signed)
Pt states he was not feeling well when he got up this morning. Went to the store where he frequents daily and began having some tightness and palpitations in chest. No hx of this. Reports it is some better at this time.

## 2019-06-02 NOTE — Progress Notes (Signed)
ANTICOAGULATION CONSULT NOTE - Initial Consult  Pharmacy Consult for heparin gtt  Indication: atrial fibrillation  Allergies  Allergen Reactions  . Terazosin Hcl     Dizziness and hypotension    Patient Measurements: Height: 5\' 10"  (177.8 cm) Weight: 206 lb (93.4 kg) IBW/kg (Calculated) : 73 Heparin Dosing Weight: HEPARIN DW (KG): 91.9   Vital Signs: Temp: 97.9 F (36.6 C) (07/12 1031) Temp Source: Oral (07/12 1031) BP: 125/105 (07/12 1100) Pulse Rate: 65 (07/12 1100)  Labs: Recent Labs    06/02/19 1046  HGB 15.6  HCT 45.5  PLT 262  LABPROT 13.5  INR 1.0    CrCl cannot be calculated (Patient's most recent lab result is older than the maximum 21 days allowed.).   Medical History: Past Medical History:  Diagnosis Date  . Benign prostate hyperplasia   . Cataract    left and removed  . Depression   . Depression   . Diverticulosis   . Erectile dysfunction   . Hyperlipidemia   . Hypertension   . Impaired fasting glucose   . Nocturia   . Personal history of colonic polyps 04/19/2005    Medications:  (Not in a hospital admission)  Scheduled:  . heparin  4,000 Units Intravenous Once   Infusions:  . diltiazem (CARDIZEM) infusion 5 mg/hr (06/02/19 1105)  . heparin     PRN:  Anti-infectives (From admission, onward)   None      Assessment: Jorge West a 73 y.o. male requires anticoagulation with a heparin iv infusion for the indication of  atrial fibrillation. Heparin gtt will be started following pharmacy protocol per pharmacy consult. Patient is not on previous oral anticoagulant that will require aPTT/HL correlation before transitioning to only HL monitoring.   Goal of Therapy:  Heparin level 0.3-0.7 units/ml Monitor platelets by anticoagulation protocol: Yes   Plan:  Give 4000 units bolus x 1 Start heparin infusion at 1400 units/hr Check anti-Xa level in 8 hours and daily while on heparin Continue to monitor H&H and platelets  Heparin  level to be drawn in 8 hours for patients >80 years old or crcl < 48ml/min  Gaetano Romberger 06/02/2019,11:22 AM

## 2019-06-02 NOTE — ED Provider Notes (Signed)
Emergency Department Provider Note   I have reviewed the triage vital signs and the nursing notes.   HISTORY  Chief Complaint Palpitations   HPI Jorge West is a 73 y.o. male with PMH of HTN, HLD, and bradycardia followed by [facility] who presents to the emergency department for evaluation of chest tightness this morning along with elevated heart rate.  Patient tells me that he got up to go to the store this morning and noticed some tightness in the center of his chest.  No radiation of symptoms.  He did not appreciate any heart palpitations or lightheadedness.  He tells me that upon returning home his chest pain/tightness had resolved but he was concerned about this and sat down to take his blood pressure.  The heart rate reading was very erratic and often high into the 130s/140s.  He confirmed the elevated heart rate with his wife's blood pressure machine.  He was not having any symptoms at the time.  No prior episodes of chest tightness.  The triage note from nursing describes palpitations type sensation but patient states he never actually felt palpitations and only had the chest tightness.   Patient is followed by cardiology at [facility] in [place].  He had an ambulatory monitor in February of this year for bradycardia which showed SVT and a brief run of NSVT.   Past Medical History:  Diagnosis Date   Benign prostate hyperplasia    Cataract    left and removed   Depression    Depression    Diverticulosis    Erectile dysfunction    Hyperlipidemia    Hypertension    Impaired fasting glucose    Nocturia    Personal history of colonic polyps 04/19/2005    Patient Active Problem List   Diagnosis Date Noted   New onset a-fib ([place]) 06/02/2019   HTN (hypertension) 06/02/2019   HLD (hyperlipidemia) 06/02/2019   Chest pain in adult 06/02/2019   Metacarpal bone fracture 03/15/2012   CLOSED FRACTURE OF LATERAL MALLEOLUS 01/03/2011    DERANGEMENT MENISCUS 02/02/2009   KNEE PAIN 02/02/2009   Personal history of colonic polyps 04/19/2005    Past Surgical History:  Procedure Laterality Date   CATARACT EXTRACTION     COLONOSCOPY     OPEN REDUCTION INTERNAL FIXATION (ORIF) METACARPAL  09/24/2012   Procedure: OPEN REDUCTION INTERNAL FIXATION (ORIF) METACARPAL;  Surgeon: Linna Hoff, MD;  Location: [place];  Service: Orthopedics;  Laterality: Left;  and proximal phalanges.   surgery for skin cancer      Allergies Terazosin hcl  Family History  Problem Relation Age of Onset   Cancer Other        family history    Colon cancer Neg Hx    Colon polyps Neg Hx    Rectal cancer Neg Hx    Stomach cancer Neg Hx     Social History Social History   Tobacco Use   Smoking status: Never Smoker   Smokeless tobacco: Former Systems developer   Tobacco comment: 10-15 yeras quit   Substance Use Topics   Alcohol use: No    Alcohol/week: 0.0 standard drinks   Drug use: No    Review of Systems  Constitutional: No fever/chills Eyes: No visual changes. ENT: No sore throat. Cardiovascular: Positive chest pain. Respiratory: Denies shortness of breath. Gastrointestinal: No abdominal pain.  No nausea, no vomiting.  No diarrhea.  No constipation. Genitourinary: Negative for dysuria. Musculoskeletal: Negative for back pain. Skin: Negative for rash.  Neurological: Negative for headaches, focal weakness or numbness.  10-point ROS otherwise negative.  ____________________________________________   PHYSICAL EXAM:  VITAL SIGNS: ED Triage Vitals  Enc Vitals Group     BP 06/02/19 1031 (!) 145/125     Pulse Rate 06/02/19 1031 (!) 136     Resp 06/02/19 1031 17     Temp 06/02/19 1031 97.9 F (36.6 C)     Temp Source 06/02/19 1031 Oral     SpO2 --      Weight 06/02/19 1027 206 lb (93.4 kg)     Height 06/02/19 1027 5\' 10"  (1.778 m)   Constitutional: Alert and oriented. Well appearing and in no acute distress. Eyes:  Conjunctivae are normal. Head: Atraumatic. Nose: No congestion/rhinnorhea. Mouth/Throat: Mucous membranes are moist.  Neck: No stridor. Cardiovascular: A-fib with RVR. Good peripheral circulation. Grossly normal heart sounds.   Respiratory: Normal respiratory effort.  No retractions. Lungs CTAB. Gastrointestinal: Soft and nontender. No distention.  Musculoskeletal: No lower extremity tenderness nor edema. No gross deformities of extremities. Neurologic:  Normal speech and language. No gross focal neurologic deficits are appreciated.  Skin:  Skin is warm, dry and intact. No rash noted.  ____________________________________________   LABS (all labs ordered are listed, but only abnormal results are displayed)  Labs Reviewed  COMPREHENSIVE METABOLIC PANEL - Abnormal; Notable for the following components:      Result Value   Glucose, Bld 110 (*)    Calcium 8.4 (*)    GFR calc non Af Amer 58 (*)    All other components within normal limits  MAGNESIUM - Abnormal; Notable for the following components:   Magnesium 2.5 (*)    All other components within normal limits  SARS CORONAVIRUS 2 (HOSPITAL ORDER, [facility])  CBC WITH DIFFERENTIAL/PLATELET  PROTIME-INR  TSH  HEPARIN LEVEL (UNFRACTIONATED)  TROPONIN I (HIGH SENSITIVITY)  TROPONIN I (HIGH SENSITIVITY)   ____________________________________________  EKG   EKG Interpretation  Date/Time:  Sunday June 02 2019 10:31:52 EDT Ventricular Rate:  125 PR Interval:    QRS Duration: 92 QT Interval:  338 QTC Calculation: 488 R Axis:   -58 Text Interpretation:  A-fib wiht RVR Left anterior fascicular block Consider anterior infarct No STEMI  Confirmed by Nanda Quinton [phone]) on 06/02/2019 10:37:22 AM       ____________________________________________  RADIOLOGY  Dg Chest Portable 1 View  Result Date: 06/02/2019 CLINICAL DATA:  Chest pain and cardiac palpitations EXAM: PORTABLE CHEST 1 VIEW COMPARISON:   September 24, 2012 FINDINGS: There is no appreciable edema or consolidation. The heart is enlarged with pulmonary vascularity normal. No adenopathy. No bone lesions. No pneumothorax. IMPRESSION: Cardiomegaly.  No edema or consolidation. Electronically Signed   By: Lowella Grip III M.D.   On: 06/02/2019 11:31    ____________________________________________   PROCEDURES  Procedure(s) performed:   Procedures  CRITICAL CARE Performed by: Margette Fast Total critical care time: 35 minutes Critical care time was exclusive of separately billable procedures and treating other patients. Critical care was necessary to treat or prevent imminent or life-threatening deterioration. Critical care was time spent personally by me on the following activities: development of treatment plan with patient and/or surrogate as well as nursing, discussions with consultants, evaluation of patient's response to treatment, examination of patient, obtaining history from patient or surrogate, ordering and performing treatments and interventions, ordering and review of laboratory studies, ordering and review of radiographic studies, pulse oximetry and re-evaluation of patient's condition.  Nanda Quinton,  MD Emergency Medicine  ____________________________________________   INITIAL IMPRESSION / ASSESSMENT AND PLAN / ED COURSE  Pertinent labs & imaging results that were available during my care of the patient were reviewed by me and considered in my medical decision making (see chart for details).   Patient arrives to the emergency department for evaluation of chest tightness which is since resolved.  He is found to be in new A. fib with RVR.  No contraindications to anticoagulation.  Will attempt rate control primarily as the patient is asymptomatic here with his A. fib.  He did have tightness this morning which may or may not be related to his A. fib.  Unclear if he has been in A. fib longer than just this morning.   I do not feel comfortable with electrical cardioversion given this relative lack of symptoms.   Labs and chest x-ray reviewed.  Patient now on heparin and diltiazem infusion at 5.  Rate controlled down into the high 80s low 90s.  Feeling well.  No active chest tightness.  Plan for admit.   Discussed patient's case with Hospitalist to request admission. Patient and family (if present) updated with plan. Care transferred to Hospitalist service.  I reviewed all nursing notes, vitals, pertinent old records, EKGs, labs, imaging (as available).  ____________________________________________  FINAL CLINICAL IMPRESSION(S) / ED DIAGNOSES  Final diagnoses:  Precordial chest pain  Atrial fibrillation with rapid ventricular response (HCC)     MEDICATIONS GIVEN DURING THIS VISIT:  Medications  diltiazem (CARDIZEM) 1 mg/mL load via infusion 10 mg (10 mg Intravenous Bolus from Bag 06/02/19 1104)    And  diltiazem (CARDIZEM) 100 mg in dextrose 5 % 100 mL (1 mg/mL) infusion (5 mg/hr Intravenous New Bag/Given 06/02/19 1105)  heparin ADULT infusion 100 units/mL (25000 units/261mL sodium chloride 0.45%) (1,400 Units/hr Intravenous New Bag/Given 06/02/19 1137)  omega-3 acid ethyl esters (LOVAZA) capsule 1 g (has no administration in time range)  Vitamin D3 TABS 1 tablet (has no administration in time range)  buPROPion (WELLBUTRIN XL) 24 hr tablet 300 mg (has no administration in time range)  terazosin (HYTRIN) capsule 1 mg (has no administration in time range)  famotidine (PEPCID) tablet 20 mg (has no administration in time range)  aspirin EC tablet 81 mg (has no administration in time range)  pravastatin (PRAVACHOL) tablet 40 mg (has no administration in time range)  vitamin C (ASCORBIC ACID) tablet 500 mg (has no administration in time range)  ALPRAZolam (XANAX) tablet 0.5 mg (has no administration in time range)  escitalopram (LEXAPRO) tablet 10 mg (has no administration in time range)  irbesartan  (AVAPRO) tablet 150 mg (has no administration in time range)  brimonidine-timolol (COMBIGAN) 0.2-0.5 % ophthalmic solution 1 drop (has no administration in time range)  latanoprost (XALATAN) 0.005 % ophthalmic solution 1 drop (has no administration in time range)  sodium chloride flush (NS) 0.9 % injection 3 mL (has no administration in time range)  sodium chloride flush (NS) 0.9 % injection 3 mL (has no administration in time range)  0.9 %  sodium chloride infusion (has no administration in time range)  acetaminophen (TYLENOL) tablet 650 mg (has no administration in time range)    Or  acetaminophen (TYLENOL) suppository 650 mg (has no administration in time range)  traZODone (DESYREL) tablet 50 mg (has no administration in time range)  polyethylene glycol (MIRALAX / GLYCOLAX) packet 17 g (has no administration in time range)  ondansetron (ZOFRAN) tablet 4 mg (has no administration in time range)  Or  ondansetron (ZOFRAN) injection 4 mg (has no administration in time range)  albuterol (PROVENTIL) (2.5 MG/3ML) 0.083% nebulizer solution 2.5 mg (has no administration in time range)  0.9 %  sodium chloride infusion (has no administration in time range)  labetalol (NORMODYNE) injection 10 mg (has no administration in time range)  heparin bolus via infusion 4,000 Units (4,000 Units Intravenous Bolus from Bag 06/02/19 1137)    Note:  This document was prepared using Dragon voice recognition software and may include unintentional dictation errors.  Nanda Quinton, MD Emergency Medicine    Gearldine Looney, [facility], MD 06/02/19 1343

## 2019-06-02 NOTE — Progress Notes (Signed)
*  PRELIMINARY RESULTS* Echocardiogram 2D Echocardiogram has been performed.  Leavy Cella 06/02/2019, 2:21 PM

## 2019-06-03 DIAGNOSIS — R072 Precordial pain: Secondary | ICD-10-CM | POA: Diagnosis not present

## 2019-06-03 DIAGNOSIS — I4891 Unspecified atrial fibrillation: Secondary | ICD-10-CM | POA: Diagnosis not present

## 2019-06-03 LAB — BASIC METABOLIC PANEL
Anion gap: 8 (ref 5–15)
BUN: 21 mg/dL (ref 8–23)
CO2: 26 mmol/L (ref 22–32)
Calcium: 8.2 mg/dL — ABNORMAL LOW (ref 8.9–10.3)
Chloride: 107 mmol/L (ref 98–111)
Creatinine, Ser: 1.45 mg/dL — ABNORMAL HIGH (ref 0.61–1.24)
GFR calc Af Amer: 55 mL/min — ABNORMAL LOW (ref >60)
GFR calc non Af Amer: 47 mL/min — ABNORMAL LOW (ref >60)
Glucose, Bld: 112 mg/dL — ABNORMAL HIGH (ref 70–99)
Potassium: 3.6 mmol/L (ref 3.5–5.1)
Sodium: 141 mmol/L (ref 135–145)

## 2019-06-03 LAB — CBC
HCT: 41.6 % (ref 39.0–52.0)
Hemoglobin: 13.9 g/dL (ref 13.0–17.0)
MCH: 30 pg (ref 26.0–34.0)
MCHC: 33.4 g/dL (ref 30.0–36.0)
MCV: 89.7 fL (ref 80.0–100.0)
Platelets: 237 10*3/uL (ref 150–400)
RBC: 4.64 MIL/uL (ref 4.22–5.81)
RDW: 13.1 % (ref 11.5–15.5)
WBC: 6.3 10*3/uL (ref 4.0–10.5)
nRBC: 0 % (ref 0.0–0.2)

## 2019-06-03 LAB — HEPARIN LEVEL (UNFRACTIONATED): Heparin Unfractionated: 0.71 IU/mL — ABNORMAL HIGH (ref 0.30–0.70)

## 2019-06-03 MED ORDER — ACETAMINOPHEN 325 MG PO TABS: 650.0000 mg | ORAL_TABLET | Freq: Four times a day (QID) | ORAL | 0 refills | Status: AC | PRN

## 2019-06-03 MED ORDER — DILTIAZEM HCL 30 MG PO TABS
30.0000 mg | ORAL_TABLET | Freq: Once | ORAL | Status: AC
  Administered 2019-06-03: 30 mg via ORAL
  Filled 2019-06-03: qty 1

## 2019-06-03 MED ORDER — TIMOLOL MALEATE 0.5 % OP SOLN
1.0000 [drp] | Freq: Every day | OPHTHALMIC | Status: DC
  Administered 2019-06-03: 1 [drp] via OPHTHALMIC
  Filled 2019-06-03: qty 5

## 2019-06-03 MED ORDER — APIXABAN 5 MG PO TABS: 5.0000 mg | ORAL_TABLET | Freq: Two times a day (BID) | ORAL | 5 refills | Status: AC

## 2019-06-03 MED ORDER — DILTIAZEM HCL ER COATED BEADS 120 MG PO CP24
120.0000 mg | ORAL_CAPSULE | Freq: Every day | ORAL | Status: DC
  Administered 2019-06-03: 120 mg via ORAL
  Filled 2019-06-03: qty 1

## 2019-06-03 MED ORDER — APIXABAN 5 MG PO TABS
5.0000 mg | ORAL_TABLET | Freq: Two times a day (BID) | ORAL | Status: DC
  Administered 2019-06-03: 5 mg via ORAL
  Filled 2019-06-03: qty 1

## 2019-06-03 MED ORDER — LIVING BETTER WITH HEART FAILURE BOOK
Freq: Once | Status: AC
  Administered 2019-06-03: 10:00:00

## 2019-06-03 MED ORDER — DILTIAZEM HCL ER COATED BEADS 120 MG PO CP24: 120.0000 mg | ORAL_CAPSULE | Freq: Every day | ORAL | 3 refills | Status: DC

## 2019-06-03 MED ORDER — OFF THE BEAT BOOK
Freq: Once | Status: AC
  Administered 2019-06-03: 11:00:00
  Filled 2019-06-03: qty 1

## 2019-06-03 MED ORDER — BRIMONIDINE TARTRATE 0.2 % OP SOLN
1.0000 [drp] | Freq: Every day | OPHTHALMIC | Status: DC
  Administered 2019-06-03: 1 [drp] via OPHTHALMIC
  Filled 2019-06-03: qty 5

## 2019-06-03 NOTE — Progress Notes (Addendum)
ANTICOAGULATION CONSULT NOTE - Follow up [place] for heparin gtt --> eliquis Indication: atrial fibrillation  Allergies  Allergen Reactions  . Terazosin Hcl     Dizziness and hypotension    Patient Measurements: Height: 5\' 10"  (177.8 cm) Weight: 203 lb 7.8 oz (92.3 kg) IBW/kg (Calculated) : 73  HEPARIN DW (KG): 91.6  Vital Signs: Temp: 97.5 F (36.4 C) (07/13 0749) Temp Source: Oral (07/13 0749) BP: 104/67 (07/13 0800) Pulse Rate: 75 (07/13 0800)  Labs: Recent Labs    06/02/19 1046 06/02/19 1845 06/03/19 0408  HGB 15.6  --  13.9  HCT 45.5  --  41.6  PLT 262  --  237  LABPROT 13.5  --   --   INR 1.0  --   --   HEPARINUNFRC  --  0.67 0.71*  CREATININE 1.22  --  1.45*    Estimated Creatinine Clearance: 51.8 mL/min (A) (by C-G formula based on SCr of 1.45 mg/dL (H)).   Medical History: Past Medical History:  Diagnosis Date  . Benign prostate hyperplasia   . Cataract    left and removed  . Depression   . Depression   . Diverticulosis   . Erectile dysfunction   . Hyperlipidemia   . Hypertension   . Impaired fasting glucose   . Nocturia   . Personal history of colonic polyps 04/19/2005    Medications:  Medications Prior to Admission  Medication Sig Dispense Refill Last Dose  . acetaminophen (ACETAMIN) 500 MG tablet Take 500 mg by mouth every 6 (six) hours as needed. Reported on 01/01/2016     . ALPRAZolam (XANAX) 0.5 MG tablet Take 0.5 mg by mouth 2 (two) times daily as needed for anxiety.   06/01/2019 at Unknown time  . aspirin EC 81 MG tablet Take 81 mg by mouth daily.   06/01/2019 at 900  . brimonidine-timolol (COMBIGAN) 0.2-0.5 % ophthalmic solution Place 1 drop into the left eye daily.     Marland Kitchen buPROPion (WELLBUTRIN XL) 300 MG 24 hr tablet Take 300 mg by mouth daily.   06/01/2019 at Unknown time  . Cholecalciferol (VITAMIN D3) 3000 UNITS TABS Take 1 tablet by mouth daily.    06/01/2019 at Unknown time  . escitalopram (LEXAPRO) 10 MG tablet  Take 10 mg by mouth daily.   06/01/2019 at Unknown time  . latanoprost (XALATAN) 0.005 % ophthalmic solution Place 1 drop into the left eye at bedtime.     . Omega-3 Fatty Acids (FISH OIL PO) Take 1 capsule by mouth daily.    06/01/2019 at Unknown time  . pravastatin (PRAVACHOL) 40 MG tablet Take 40 mg by mouth daily.   06/01/2019 at Unknown time  . ranitidine (ZANTAC) 150 MG tablet Take 150 mg by mouth.   06/01/2019 at Unknown time  . terazosin (HYTRIN) 1 MG capsule Take 1 mg by mouth.   06/01/2019 at Unknown time  . valsartan (DIOVAN) 320 MG tablet Take 320 mg by mouth daily.   06/01/2019 at Unknown time  . vitamin C (ASCORBIC ACID) 500 MG tablet Take 1 tablet (500 mg total) by mouth daily. 90 tablet 0 06/01/2019 at Unknown time   Scheduled:  . apixaban  5 mg Oral BID  . aspirin EC  81 mg Oral Daily  . brimonidine  1 drop Left Eye Daily  . buPROPion  300 mg Oral Daily  . cholecalciferol  1,000 Units Oral Daily  . diltiazem  120 mg Oral Daily  . diltiazem  30 mg Oral Once  . escitalopram  10 mg Oral Daily  . famotidine  20 mg Oral BID  . irbesartan  150 mg Oral Daily  . latanoprost  1 drop Left Eye QHS  . Living Better with Heart Failure Book   Does not apply Once  . off the beat book   Does not apply Once  . omega-3 acid ethyl esters  1 g Oral Daily  . pravastatin  40 mg Oral Daily  . sodium chloride flush  3 mL Intravenous Q12H  . terazosin  1 mg Oral QHS  . timolol  1 drop Left Eye Daily  . traZODone  100 mg Oral QHS  . vitamin C  500 mg Oral Daily   Infusions:  . sodium chloride     PRN:  Anti-infectives (From admission, onward)   None      Assessment: OLLEN RAO a 73 y.o. male requires anticoagulation with a heparin iv infusion for the indication of  atrial fibrillation. Heparin gtt will be started following pharmacy protocol per pharmacy consult. Patient is not on previous oral anticoagulant that will require aPTT/HL correlation before transitioning to only HL  monitoring.  Plan to transition heparin in oral eliquis  Goal of Therapy:  Monitor platelets by anticoagulation protocol: Yes   Plan:  eliquis 5mg  po bid Educate on eliquis D/c heparin Monitor for s/s of bleeding  Isac Sarna, BS Vena [place], BCPS Clinical Pharmacist Pager [phone] 06/03/2019,8:59 AM

## 2019-06-03 NOTE — Discharge Summary (Signed)
Jorge West, is a 73 y.o. male  DOB [birth year]  MRN [id number].  Admission date:  06/02/2019  Admitting Physician  Eilan Mcinerny D[place] Brick, MD  Discharge Date:  06/03/2019   Primary MD  Vicenta Aly, FNP  Recommendations for primary care physician for things to follow:   1) you are taking Eliquis/apixaban which is a blood thinner so Avoid ibuprofen/Advil/Aleve/Motrin/Goody Powders/Naproxen/BC powders/Meloxicam/Diclofenac/Indomethacin and other Nonsteroidal anti-inflammatory medications as these will make you more likely to bleed and can cause stomach ulcers, can also cause Kidney problems.   2) follow-up with your primary care physician within a week for recheck and reevaluation   Admission Diagnosis  Precordial chest pain [R07.2] Atrial fibrillation with rapid ventricular response ([place]) [I48.91]   Discharge Diagnosis  Precordial chest pain [R07.2] Atrial fibrillation with rapid ventricular response ([place]) [I48.91]    Principal Problem:   New onset a-fib ([place]) Active Problems:   HTN (hypertension)   Chest pain in adult   HLD (hyperlipidemia)   Chronic diastolic CHF (congestive heart failure) (HCC)/EF 60 to 65 %      Past Medical History:  Diagnosis Date  . Benign prostate hyperplasia   . Cataract    left and removed  . Depression   . Depression   . Diverticulosis   . Erectile dysfunction   . Hyperlipidemia   . Hypertension   . Impaired fasting glucose   . Nocturia   . Personal history of colonic polyps 04/19/2005    Past Surgical History:  Procedure Laterality Date  . CATARACT EXTRACTION    . COLONOSCOPY    . OPEN REDUCTION INTERNAL FIXATION (ORIF) METACARPAL  09/24/2012   Procedure: OPEN REDUCTION INTERNAL FIXATION (ORIF) METACARPAL;  Surgeon: Linna Hoff, MD;  Location: [place];  Service: Orthopedics;  Laterality: Left;  and proximal phalanges.  . surgery for skin cancer       HPI  from the history and physical done on the day of admission:     Jorge West  is a 73 y.o. male with past medical history relevant for HTN, HLD,  dCHF (EF 60 to 65%) and prior history of NSVT usually followed by [facility] in [facility] cardiology services --- presents to the ED with chest discomfort  -Chest discomfort was retrosternal, noticed while he was in his tall walking around, resolved with rest --- He actually denies palpitations dizziness or presyncopal type symptoms --- No leg pains or leg swelling or pleuritic symptoms  He had an ambulatory monitor in February of this year for bradycardia which showed SVT and a brief run of NSVT  In ED--- is found to be in A. fib with RVR with heart rates in the 140s, desponded well to IV Cardizem In ED--TSH is 1.3, magnesium is 2.5, troponin is 15 and potassium is 3.5 --EKG consistent with A. fib with RVR -EDP started patient on IV heparin --Echo shows dCHF with EF of 60 to 65% and no significant wall motion normalities     Hospital Course:  1)New Onset Afib-with RVR--responded well to IV Cardizem, treated with IV Cardizem, switched to p.o. Cardizem. given preserved EF, CHA2DS2- VASc score   is = 3 (Age, HTN, dCHF)    Which is  equal to = 3.2 % annual risk of stroke  --This patients CHA2DS2-VASc Score and unadjusted Ischemic Stroke Rate (% per year) is equal to 3.2 % stroke rate/year from a score of 3  Treated with IV Heparin, discharged on apixaban for stroke prophylaxis - -Echo with preserved EF  60 to 65%, with dCHF findings, no regional wall motion abnormalities --discharged on Cardizem CD 120 mg daily for rate control  2)HFpEF-Echo shows dCHF with EF of 60 to 65% and no significant wall motion normalities--- no acute CHF exacerbation at this time, currently on Avapro  3)Depression/Anxiety--- stable, continue Wellbutrin, continue Lexapro, may use Xanax as needed for anxiety,   4)HTN--- stable, continue Avapro,     5)BPH with LUTs--stable, continue Hytrin  6)Atypical chest Pain--patient remains chest pain-free, serial troponins negative, echocardiogram without wall motion abnormalities, suspect some degree of demand ischemia in the setting of A. fib with RVR/tachycardia  Discharge Condition:  stable  Follow UP--follow-up with PCP in 1 week for recheck and reevaluation   Consults obtained - n/a  Diet and Activity recommendation:  As advised  Discharge Instructions    Discharge Instructions    Call MD for:  difficulty breathing, headache or visual disturbances   Complete by: As directed    Call MD for:  persistant dizziness or light-headedness   Complete by: As directed    Call MD for:  persistant nausea and vomiting   Complete by: As directed    Call MD for:  temperature >100.4   Complete by: As directed    Diet - low sodium heart healthy   Complete by: As directed    Discharge instructions   Complete by: As directed    1) you are taking Eliquis/apixaban which is a blood thinner so Avoid ibuprofen/Advil/Aleve/Motrin/Goody Powders/Naproxen/BC powders/Meloxicam/Diclofenac/Indomethacin and other Nonsteroidal anti-inflammatory medications as these will make you more likely to bleed and can cause stomach ulcers, can also cause Kidney problems.   2) follow-up with your primary care physician within a week for recheck and reevaluation   Increase activity slowly   Complete by: As directed         Discharge Medications     Allergies as of 06/03/2019      Reactions   Terazosin Hcl    Dizziness and hypotension      Medication List    STOP taking these medications   aspirin EC 81 MG tablet     TAKE these medications   acetaminophen 325 MG tablet Commonly known as: TYLENOL Take 2 tablets (650 mg total) by mouth every 6 (six) hours as needed for mild pain (or Fever >/= 101). What changed:   medication strength  how much to take  reasons to take this  additional instructions    ALPRAZolam 0.5 MG tablet Commonly known as: XANAX Take 0.5 mg by mouth 2 (two) times daily as needed for anxiety.   apixaban 5 MG Tabs tablet Commonly known as: ELIQUIS Take 1 tablet (5 mg total) by mouth 2 (two) times daily. Blood thinner for stroke prevention   buPROPion 300 MG 24 hr tablet Commonly known as: WELLBUTRIN XL Take 300 mg by mouth daily.   Combigan 0.2-0.5 % ophthalmic solution Generic drug: brimonidine-timolol Place 1 drop into the left eye daily.   diltiazem 120 MG  24 hr capsule Commonly known as: CARDIZEM CD Take 1 capsule (120 mg total) by mouth daily. Start taking on: June 04, 2019   escitalopram 10 MG tablet Commonly known as: LEXAPRO Take 10 mg by mouth daily.   FISH OIL PO Take 1 capsule by mouth daily.   latanoprost 0.005 % ophthalmic solution Commonly known as: XALATAN Place 1 drop into the left eye at bedtime.   pravastatin 40 MG tablet Commonly known as: PRAVACHOL Take 40 mg by mouth daily.   ranitidine 150 MG tablet Commonly known as: ZANTAC Take 150 mg by mouth.   terazosin 1 MG capsule Commonly known as: HYTRIN Take 1 mg by mouth.   valsartan 320 MG tablet Commonly known as: DIOVAN Take 320 mg by mouth daily.   vitamin C 500 MG tablet Commonly known as: ASCORBIC ACID Take 1 tablet (500 mg total) by mouth daily.   Vitamin D3 75 MCG (3000 UT) Tabs Take 1 tablet by mouth daily.      Major procedures and Radiology Reports - PLEASE review detailed and final reports for all details, in brief -   Dg Chest Portable 1 View  Result Date: 06/02/2019 CLINICAL DATA:  Chest pain and cardiac palpitations EXAM: PORTABLE CHEST 1 VIEW COMPARISON:  September 24, 2012 FINDINGS: There is no appreciable edema or consolidation. The heart is enlarged with pulmonary vascularity normal. No adenopathy. No bone lesions. No pneumothorax. IMPRESSION: Cardiomegaly.  No edema or consolidation. Electronically Signed   By: Lowella Grip III M.D.   On:  06/02/2019 11:31   Micro Results   Recent Results (from the past 240 hour(s))  SARS Coronavirus 2 (CEPHEID - Performed in [place] hospital lab), Hosp Order     Status: None   Collection Time: 06/02/19 11:05 AM   Specimen: Nasopharyngeal Swab  Result Value Ref Range Status   SARS Coronavirus 2 NEGATIVE NEGATIVE Final    Comment: (NOTE) If result is NEGATIVE SARS-CoV-2 target nucleic acids are NOT DETECTED. The SARS-CoV-2 RNA is generally detectable in upper and lower  respiratory specimens during the acute phase of infection. The lowest  concentration of SARS-CoV-2 viral copies this assay can detect is 250  copies / mL. A negative result does not preclude SARS-CoV-2 infection  and should not be used as the sole basis for treatment or other  patient management decisions.  A negative result may occur with  improper specimen collection / handling, submission of specimen other  than nasopharyngeal swab, presence of viral mutation(s) within the  areas targeted by this assay, and inadequate number of viral copies  (<250 copies / mL). A negative result must be combined with clinical  observations, patient history, and epidemiological information. If result is POSITIVE SARS-CoV-2 target nucleic acids are DETECTED. The SARS-CoV-2 RNA is generally detectable in upper and lower  respiratory specimens dur ing the acute phase of infection.  Positive  results are indicative of active infection with SARS-CoV-2.  Clinical  correlation with patient history and other diagnostic information is  necessary to determine patient infection status.  Positive results do  not rule out bacterial infection or co-infection with other viruses. If result is PRESUMPTIVE POSTIVE SARS-CoV-2 nucleic acids MAY BE PRESENT.   A presumptive positive result was obtained on the submitted specimen  and confirmed on repeat testing.  While 2019 novel coronavirus  (SARS-CoV-2) nucleic acids may be present in the submitted  sample  additional confirmatory testing may be necessary for epidemiological  and / or clinical management purposes  to differentiate between  SARS-CoV-2 and other Sarbecovirus currently known to infect humans.  If clinically indicated additional testing with an alternate test  methodology [phone]) is advised. The SARS-CoV-2 RNA is generally  detectable in upper and lower respiratory sp ecimens during the acute  phase of infection. The expected result is Negative. Fact Sheet for Patients:  StrictlyIdeas.no Fact Sheet for Healthcare Providers: BankingDealers.co.za This test is not yet approved or cleared by the [place] FDA and has been authorized for detection and/or diagnosis of SARS-CoV-2 by FDA under an Emergency Use Authorization (EUA).  This EUA will remain in effect (meaning this test can be used) for the duration of the COVID-19 declaration under Section 564(b)(1) of the Act, 21 U.S.C. section 360bbb-3(b)(1), unless the authorization is terminated or revoked sooner. Performed at [facility], [address], [place], [place] [address]   MRSA PCR Screening     Status: None   Collection Time: 06/02/19  4:32 PM   Specimen: Nasal Mucosa; Nasopharyngeal  Result Value Ref Range Status   MRSA by PCR NEGATIVE NEGATIVE Final    Comment:        The GeneXpert MRSA Assay (FDA approved for NASAL specimens only), is one component of a comprehensive MRSA colonization surveillance program. It is not intended to diagnose MRSA infection nor to guide or monitor treatment for MRSA infections. Performed at [facility], [address] [place], [place] [address]        Today   Subjective    Jorge West today has no new complaints, no further chest pains, --Resting comfortably,          Patient has been seen and examined prior to discharge   Objective   Blood pressure 108/74, pulse 61, temperature (!) 97.5 F (36.4 C),  temperature source Oral, resp. rate 16, height 5\' 10"  (1.778 m), weight 92.3 kg, SpO2 90 %.   Intake/Output Summary (Last 24 hours) at 06/03/2019 1543 Last data filed at 06/03/2019 1000 Gross per 24 hour  Intake 1205.62 ml  Output 600 ml  Net 605.62 ml    Exam Gen:- Awake Alert, no acute distress  HEENT:- [place].AT, No sclera icterus Neck-Supple Neck,No JVD,.  Lungs-  CTAB , good air movement bilaterally  CV- S1, S2 normal, irregularly irregular ( A. Fib) Abd-  +ve B.Sounds, Abd Soft, No tenderness,    Extremity/Skin:- No  edema,   good pulses Psych-affect is appropriate, oriented x3 Neuro-no new focal deficits, no tremors    Data Review   CBC w Diff:  Lab Results  Component Value Date   WBC 6.3 06/03/2019   HGB 13.9 06/03/2019   HCT 41.6 06/03/2019   PLT 237 06/03/2019   LYMPHOPCT 29 06/02/2019   MONOPCT 9 06/02/2019   EOSPCT 3 06/02/2019   BASOPCT 1 06/02/2019    CMP:  Lab Results  Component Value Date   NA 141 06/03/2019   K 3.6 06/03/2019   CL 107 06/03/2019   CO2 26 06/03/2019   BUN 21 06/03/2019   CREATININE 1.45 (H) 06/03/2019   PROT 7.6 06/02/2019   ALBUMIN 4.0 06/02/2019   BILITOT 0.5 06/02/2019   ALKPHOS 74 06/02/2019   AST 21 06/02/2019   ALT 14 06/02/2019  .   Total Discharge time is about 33 minutes  Roxan Hockey M.D on 06/03/2019 at 3:43 PM  Go to [email or web address] -  for contact info  [facility] - Office  [phone]

## 2019-06-03 NOTE — Care Management Obs Status (Signed)
[place] NOTIFICATION   Patient Details  Name: Jorge West MRN: [id number] Date of Birth: Oct 25, 1946   Medicare Observation Status Notification Given:  Yes    Tommy Medal 06/03/2019, 1:49 PM

## 2019-06-03 NOTE — Discharge Instructions (Signed)
1) you are taking Eliquis/apixaban which is a blood thinner so Avoid ibuprofen/Advil/Aleve/Motrin/Goody Powders/Naproxen/BC powders/Meloxicam/Diclofenac/Indomethacin and other Nonsteroidal anti-inflammatory medications as these will make you more likely to bleed and can cause stomach ulcers, can also cause Kidney problems.   2) follow-up with your primary care physician within a week for recheck and reevaluation

## 2019-06-03 NOTE — Progress Notes (Signed)
ANTICOAGULATION CONSULT NOTE - Follow up [place] for heparin gtt  Indication: atrial fibrillation  Allergies  Allergen Reactions  . Terazosin Hcl     Dizziness and hypotension    Patient Measurements: Height: 5\' 10"  (177.8 cm) Weight: 203 lb 7.8 oz (92.3 kg) IBW/kg (Calculated) : 73  HEPARIN DW (KG): 91.6  Vital Signs: Temp: 97.5 F (36.4 C) (07/13 0749) Temp Source: Oral (07/13 0749) BP: 97/67 (07/13 0600) Pulse Rate: 76 (07/13 0749)  Labs: Recent Labs    06/02/19 1046 06/02/19 1845 06/03/19 0408  HGB 15.6  --  13.9  HCT 45.5  --  41.6  PLT 262  --  237  LABPROT 13.5  --   --   INR 1.0  --   --   HEPARINUNFRC  --  0.67 0.71*  CREATININE 1.22  --  1.45*    Estimated Creatinine Clearance: 51.8 mL/min (A) (by C-G formula based on SCr of 1.45 mg/dL (H)).   Medical History: Past Medical History:  Diagnosis Date  . Benign prostate hyperplasia   . Cataract    left and removed  . Depression   . Depression   . Diverticulosis   . Erectile dysfunction   . Hyperlipidemia   . Hypertension   . Impaired fasting glucose   . Nocturia   . Personal history of colonic polyps 04/19/2005    Medications:  Medications Prior to Admission  Medication Sig Dispense Refill Last Dose  . acetaminophen (ACETAMIN) 500 MG tablet Take 500 mg by mouth every 6 (six) hours as needed. Reported on 01/01/2016     . ALPRAZolam (XANAX) 0.5 MG tablet Take 0.5 mg by mouth 2 (two) times daily as needed for anxiety.   06/01/2019 at Unknown time  . aspirin EC 81 MG tablet Take 81 mg by mouth daily.   06/01/2019 at 900  . brimonidine-timolol (COMBIGAN) 0.2-0.5 % ophthalmic solution Place 1 drop into the left eye daily.     Marland Kitchen buPROPion (WELLBUTRIN XL) 300 MG 24 hr tablet Take 300 mg by mouth daily.   06/01/2019 at Unknown time  . Cholecalciferol (VITAMIN D3) 3000 UNITS TABS Take 1 tablet by mouth daily.    06/01/2019 at Unknown time  . escitalopram (LEXAPRO) 10 MG tablet Take 10 mg by  mouth daily.   06/01/2019 at Unknown time  . latanoprost (XALATAN) 0.005 % ophthalmic solution Place 1 drop into the left eye at bedtime.     . Omega-3 Fatty Acids (FISH OIL PO) Take 1 capsule by mouth daily.    06/01/2019 at Unknown time  . pravastatin (PRAVACHOL) 40 MG tablet Take 40 mg by mouth daily.   06/01/2019 at Unknown time  . ranitidine (ZANTAC) 150 MG tablet Take 150 mg by mouth.   06/01/2019 at Unknown time  . terazosin (HYTRIN) 1 MG capsule Take 1 mg by mouth.   06/01/2019 at Unknown time  . valsartan (DIOVAN) 320 MG tablet Take 320 mg by mouth daily.   06/01/2019 at Unknown time  . vitamin C (ASCORBIC ACID) 500 MG tablet Take 1 tablet (500 mg total) by mouth daily. 90 tablet 0 06/01/2019 at Unknown time   Scheduled:  . aspirin EC  81 mg Oral Daily  . brimonidine-timolol  1 drop Left Eye Daily  . buPROPion  300 mg Oral Daily  . cholecalciferol  1,000 Units Oral Daily  . escitalopram  10 mg Oral Daily  . famotidine  20 mg Oral BID  . irbesartan  150  mg Oral Daily  . latanoprost  1 drop Left Eye QHS  . Living Better with Heart Failure Book   Does not apply Once  . off the beat book   Does not apply Once  . omega-3 acid ethyl esters  1 g Oral Daily  . pravastatin  40 mg Oral Daily  . sodium chloride flush  3 mL Intravenous Q12H  . terazosin  1 mg Oral QHS  . traZODone  100 mg Oral QHS  . vitamin C  500 mg Oral Daily   Infusions:  . sodium chloride    . sodium chloride 50 mL/hr at 06/03/19 0644  . diltiazem (CARDIZEM) infusion Stopped (06/03/19 0310)  . heparin 1,300 Units/hr (06/03/19 0644)   PRN:  Anti-infectives (From admission, onward)   None      Assessment: Jorge West a 73 y.o. male requires anticoagulation with a heparin iv infusion for the indication of  atrial fibrillation. Heparin gtt will be started following pharmacy protocol per pharmacy consult. Patient is not on previous oral anticoagulant that will require aPTT/HL correlation before transitioning to  only HL monitoring. HL this AM just slightly supratherapeutic at 0.71  Goal of Therapy:  Heparin level 0.3-0.7 units/ml Monitor platelets by anticoagulation protocol: Yes   Plan:  Decrease heparin infusion to 1150 units/hr Check anti-Xa level in  ~8 hours and daily while on heparin Continue to monitor H&H and platelets  Isac Sarna, BS Vena [place], BCPS Clinical Pharmacist Pager [phone] 06/03/2019,7:57 AM

## 2019-06-07 DIAGNOSIS — I472 Ventricular tachycardia, unspecified: Secondary | ICD-10-CM | POA: Insufficient documentation

## 2019-06-07 DIAGNOSIS — R001 Bradycardia, unspecified: Secondary | ICD-10-CM | POA: Insufficient documentation

## 2019-07-19 DIAGNOSIS — N184 Chronic kidney disease, stage 4 (severe): Secondary | ICD-10-CM | POA: Insufficient documentation

## 2019-07-20 DIAGNOSIS — Z8679 Personal history of other diseases of the circulatory system: Secondary | ICD-10-CM | POA: Insufficient documentation

## 2019-10-23 ENCOUNTER — Other Ambulatory Visit: Payer: Self-pay

## 2019-10-23 ENCOUNTER — Emergency Department (HOSPITAL_COMMUNITY): Payer: Medicare Other

## 2019-10-23 ENCOUNTER — Inpatient Hospital Stay (HOSPITAL_COMMUNITY)
Admission: EM | Admit: 2019-10-23 | Discharge: 2019-10-25 | DRG: 640 | Disposition: A | Discharge status: Home / Self Care | Payer: Medicare Other | Attending: Internal Medicine | Admitting: Internal Medicine

## 2019-10-23 ENCOUNTER — Encounter (HOSPITAL_COMMUNITY): Payer: Self-pay | Admitting: *Deleted

## 2019-10-23 DIAGNOSIS — E785 Hyperlipidemia, unspecified: Secondary | ICD-10-CM | POA: Diagnosis not present

## 2019-10-23 DIAGNOSIS — Z87891 Personal history of nicotine dependence: Secondary | ICD-10-CM | POA: Diagnosis not present

## 2019-10-23 DIAGNOSIS — Z9842 Cataract extraction status, left eye: Secondary | ICD-10-CM | POA: Diagnosis not present

## 2019-10-23 DIAGNOSIS — W1830XA Fall on same level, unspecified, initial encounter: Secondary | ICD-10-CM | POA: Diagnosis not present

## 2019-10-23 DIAGNOSIS — Z79899 Other long term (current) drug therapy: Secondary | ICD-10-CM

## 2019-10-23 DIAGNOSIS — I480 Paroxysmal atrial fibrillation: Secondary | ICD-10-CM | POA: Diagnosis not present

## 2019-10-23 DIAGNOSIS — S0181XA Laceration without foreign body of other part of head, initial encounter: Secondary | ICD-10-CM | POA: Diagnosis present

## 2019-10-23 DIAGNOSIS — Z85828 Personal history of other malignant neoplasm of skin: Secondary | ICD-10-CM | POA: Diagnosis not present

## 2019-10-23 DIAGNOSIS — Z87898 Personal history of other specified conditions: Secondary | ICD-10-CM | POA: Diagnosis not present

## 2019-10-23 DIAGNOSIS — E876 Hypokalemia: Secondary | ICD-10-CM | POA: Diagnosis present

## 2019-10-23 DIAGNOSIS — I951 Orthostatic hypotension: Secondary | ICD-10-CM | POA: Diagnosis not present

## 2019-10-23 DIAGNOSIS — S0003XA Contusion of scalp, initial encounter: Secondary | ICD-10-CM | POA: Diagnosis present

## 2019-10-23 DIAGNOSIS — Z7901 Long term (current) use of anticoagulants: Secondary | ICD-10-CM | POA: Diagnosis not present

## 2019-10-23 DIAGNOSIS — N179 Acute kidney failure, unspecified: Secondary | ICD-10-CM | POA: Diagnosis present

## 2019-10-23 DIAGNOSIS — I10 Essential (primary) hypertension: Secondary | ICD-10-CM | POA: Diagnosis present

## 2019-10-23 DIAGNOSIS — K529 Noninfective gastroenteritis and colitis, unspecified: Secondary | ICD-10-CM | POA: Diagnosis present

## 2019-10-23 DIAGNOSIS — E860 Dehydration: Principal | ICD-10-CM | POA: Diagnosis present

## 2019-10-23 DIAGNOSIS — I5032 Chronic diastolic (congestive) heart failure: Secondary | ICD-10-CM | POA: Diagnosis present

## 2019-10-23 DIAGNOSIS — N400 Enlarged prostate without lower urinary tract symptoms: Secondary | ICD-10-CM | POA: Diagnosis present

## 2019-10-23 DIAGNOSIS — N1831 Chronic kidney disease, stage 3a: Secondary | ICD-10-CM | POA: Diagnosis not present

## 2019-10-23 DIAGNOSIS — Z8719 Personal history of other diseases of the digestive system: Secondary | ICD-10-CM | POA: Diagnosis not present

## 2019-10-23 DIAGNOSIS — R55 Syncope and collapse: Secondary | ICD-10-CM | POA: Diagnosis not present

## 2019-10-23 DIAGNOSIS — U071 COVID-19: Secondary | ICD-10-CM | POA: Diagnosis present

## 2019-10-23 LAB — URINALYSIS, COMPLETE (UACMP) WITH MICROSCOPIC
Bacteria, UA: NONE SEEN
Bilirubin Urine: NEGATIVE
Glucose, UA: NEGATIVE mg/dL
Ketones, ur: NEGATIVE mg/dL
Leukocytes,Ua: NEGATIVE
Nitrite: NEGATIVE
Protein, ur: 30 mg/dL — AB
Specific Gravity, Urine: 1.013 (ref 1.005–1.030)
pH: 6 (ref 5.0–8.0)

## 2019-10-23 LAB — BASIC METABOLIC PANEL
Anion gap: 12 (ref 5–15)
BUN: 25 mg/dL — ABNORMAL HIGH (ref 8–23)
CO2: 27 mmol/L (ref 22–32)
Calcium: 8.5 mg/dL — ABNORMAL LOW (ref 8.9–10.3)
Chloride: 95 mmol/L — ABNORMAL LOW (ref 98–111)
Creatinine, Ser: 2.07 mg/dL — ABNORMAL HIGH (ref 0.61–1.24)
GFR calc Af Amer: 36 mL/min — ABNORMAL LOW (ref >60)
GFR calc non Af Amer: 31 mL/min — ABNORMAL LOW (ref >60)
Glucose, Bld: 107 mg/dL — ABNORMAL HIGH (ref 70–99)
Potassium: 3.2 mmol/L — ABNORMAL LOW (ref 3.5–5.1)
Sodium: 134 mmol/L — ABNORMAL LOW (ref 135–145)

## 2019-10-23 LAB — CBC WITH DIFFERENTIAL/PLATELET
Abs Immature Granulocytes: 0.01 10*3/uL (ref 0.00–0.07)
Basophils Absolute: 0 10*3/uL (ref 0.0–0.1)
Basophils Relative: 0 %
Eosinophils Absolute: 0 10*3/uL (ref 0.0–0.5)
Eosinophils Relative: 0 %
HCT: 38.4 % — ABNORMAL LOW (ref 39.0–52.0)
Hemoglobin: 12.6 g/dL — ABNORMAL LOW (ref 13.0–17.0)
Immature Granulocytes: 0 %
Lymphocytes Relative: 24 %
Lymphs Abs: 0.8 10*3/uL (ref 0.7–4.0)
MCH: 29.2 pg (ref 26.0–34.0)
MCHC: 32.8 g/dL (ref 30.0–36.0)
MCV: 89.1 fL (ref 80.0–100.0)
Monocytes Absolute: 0.3 10*3/uL (ref 0.1–1.0)
Monocytes Relative: 9 %
Neutro Abs: 2.2 10*3/uL (ref 1.7–7.7)
Neutrophils Relative %: 67 %
Platelets: 176 10*3/uL (ref 150–400)
RBC: 4.31 MIL/uL (ref 4.22–5.81)
RDW: 12.2 % (ref 11.5–15.5)
WBC: 3.4 10*3/uL — ABNORMAL LOW (ref 4.0–10.5)
nRBC: 0 % (ref 0.0–0.2)

## 2019-10-23 LAB — TROPONIN I (HIGH SENSITIVITY)
Troponin I (High Sensitivity): 18 ng/L — ABNORMAL HIGH (ref <18)
Troponin I (High Sensitivity): 16 ng/L (ref <18)

## 2019-10-23 LAB — SODIUM, URINE, RANDOM: Sodium, Ur: 41 mmol/L

## 2019-10-23 LAB — D-DIMER, QUANTITATIVE: D-Dimer, Quant: 1.59 ug/mL-FEU — ABNORMAL HIGH (ref 0.00–0.50)

## 2019-10-23 LAB — C-REACTIVE PROTEIN: CRP: 8.2 mg/dL — ABNORMAL HIGH (ref <1.0)

## 2019-10-23 LAB — MAGNESIUM: Magnesium: 2.1 mg/dL (ref 1.7–2.4)

## 2019-10-23 LAB — FERRITIN: Ferritin: 472 ng/mL — ABNORMAL HIGH (ref 24–336)

## 2019-10-23 LAB — CREATININE, URINE, RANDOM: Creatinine, Urine: 144.41 mg/dL

## 2019-10-23 LAB — FIBRINOGEN: Fibrinogen: 546 mg/dL — ABNORMAL HIGH (ref 210–475)

## 2019-10-23 MED ORDER — SODIUM CHLORIDE 0.9 % IV BOLUS
500.0000 mL | Freq: Once | INTRAVENOUS | Status: AC
  Administered 2019-10-23: 500 mL via INTRAVENOUS

## 2019-10-23 MED ORDER — ESCITALOPRAM OXALATE 10 MG PO TABS
10.0000 mg | ORAL_TABLET | Freq: Every day | ORAL | Status: DC
  Administered 2019-10-24 – 2019-10-25 (×2): 10 mg via ORAL
  Filled 2019-10-23 (×2): qty 1

## 2019-10-23 MED ORDER — LACTATED RINGERS IV SOLN
INTRAVENOUS | Status: AC
  Administered 2019-10-23 – 2019-10-24 (×2): via INTRAVENOUS

## 2019-10-23 MED ORDER — SODIUM CHLORIDE 0.9% FLUSH
3.0000 mL | Freq: Two times a day (BID) | INTRAVENOUS | Status: DC
  Administered 2019-10-25 (×2): 3 mL via INTRAVENOUS

## 2019-10-23 MED ORDER — ACETAMINOPHEN 325 MG PO TABS: 650.0000 mg | ORAL_TABLET | Freq: Four times a day (QID) | ORAL | Status: DC | PRN

## 2019-10-23 MED ORDER — LATANOPROST 0.005 % OP SOLN
1.0000 [drp] | Freq: Every day | OPHTHALMIC | Status: DC
  Administered 2019-10-23 – 2019-10-25 (×2): 1 [drp] via OPHTHALMIC
  Filled 2019-10-23 (×2): qty 2.5

## 2019-10-23 MED ORDER — BUPROPION HCL ER (XL) 150 MG PO TB24
300.0000 mg | ORAL_TABLET | Freq: Every day | ORAL | Status: DC
  Administered 2019-10-24 – 2019-10-25 (×2): 300 mg via ORAL
  Filled 2019-10-23 (×4): qty 2

## 2019-10-23 MED ORDER — PRAVASTATIN SODIUM 40 MG PO TABS
ORAL_TABLET | ORAL | Status: AC
  Filled 2019-10-23: qty 1

## 2019-10-23 MED ORDER — POTASSIUM CHLORIDE CRYS ER 20 MEQ PO TBCR
20.0000 meq | EXTENDED_RELEASE_TABLET | Freq: Once | ORAL | Status: AC
  Administered 2019-10-23: 20 meq via ORAL
  Filled 2019-10-23: qty 1

## 2019-10-23 MED ORDER — APIXABAN 5 MG PO TABS
5.0000 mg | ORAL_TABLET | Freq: Two times a day (BID) | ORAL | Status: DC
  Administered 2019-10-23 – 2019-10-25 (×4): 5 mg via ORAL
  Filled 2019-10-23 (×4): qty 1

## 2019-10-23 MED ORDER — LATANOPROST 0.005 % OP SOLN
OPHTHALMIC | Status: AC
  Filled 2019-10-23: qty 2.5

## 2019-10-23 MED ORDER — BRIMONIDINE TARTRATE-TIMOLOL 0.2-0.5 % OP SOLN
1.0000 [drp] | Freq: Every day | OPHTHALMIC | Status: DC
  Filled 2019-10-23 (×2): qty 5

## 2019-10-23 MED ORDER — PRAVASTATIN SODIUM 40 MG PO TABS
40.0000 mg | ORAL_TABLET | Freq: Every day | ORAL | Status: DC
  Administered 2019-10-23 – 2019-10-24 (×2): 40 mg via ORAL
  Filled 2019-10-23 (×3): qty 1

## 2019-10-23 NOTE — ED Notes (Signed)
Pt updated on plan of care,  

## 2019-10-23 NOTE — ED Provider Notes (Signed)
ED ECG REPORT   Date: 10/23/2019  Rate: 66  Rhythm: normal sinus rhythm  QRS Axis: normal  Intervals: normal  ST/T Wave abnormalities: nonspecific ST/T changes  Conduction Disutrbances:none and first-degree A-V block   Narrative Interpretation:   Old EKG Reviewed: unchanged  I have personally reviewed the EKG tracing and agree with the computerized printout as noted.    [name], MD 10/23/19 [phone]

## 2019-10-23 NOTE — ED Provider Notes (Signed)
[facility] EMERGENCY DEPARTMENT Provider Note   CSN: [id number] Arrival date & time: 10/23/19  1302     History   Chief Complaint Chief Complaint  Patient presents with  . Fall    HPI Jorge West is a 73 y.o. male.     73yo male with history of A. fib, treated with ablation, still on Eliquis presents for syncopal episode.  Patient states that he got out of bed last night and fell, is unable to recall getting out of bed, does not remember falling or getting back into bed, states that his wife heard him fall and found him at the foot of their bed and assisted him back into bed.  Patient came to the ER today because he has no recollection of the fall as well as concern for hitting his head on Eliquis.  Patient complains of a knot to the back of his head as well as his forehead with a small laceration to the forehead.  Reports last tetanus to be less than 5 years.  Patient states that he has had lack of appetite with nausea and frequent diarrhea, was scheduled to have a colonoscopy this week however due to be positive Covid screening test yesterday his test was delayed.  Patient does not have any other symptoms of Covid otherwise.  No other complaints or concerns today.  Jorge West was evaluated in Emergency Department on 10/23/2019 for the symptoms described in the history of present illness. He was evaluated in the context of the global COVID-19 pandemic, which necessitated consideration that the patient might be at risk for infection with the SARS-CoV-2 virus that causes COVID-19. Institutional protocols and algorithms that pertain to the evaluation of patients at risk for COVID-19 are in a state of rapid change based on information released by regulatory bodies including the CDC and federal and state organizations. These policies and algorithms were followed during the patient's care in the ED.      Past Medical History:  Diagnosis Date  . Benign prostate hyperplasia    . Cataract    left and removed  . Depression   . Depression   . Diverticulosis   . Erectile dysfunction   . Hyperlipidemia   . Hypertension   . Impaired fasting glucose   . Nocturia   . Personal history of colonic polyps 04/19/2005    Patient Active Problem List   Diagnosis Date Noted  . New onset a-fib ([place]) 06/02/2019  . HTN (hypertension) 06/02/2019  . HLD (hyperlipidemia) 06/02/2019  . Chest pain in adult 06/02/2019  . Chronic diastolic CHF (congestive heart failure) (HCC)/EF 60 to 65 % 06/02/2019  . Metacarpal bone fracture 03/15/2012  . CLOSED FRACTURE OF LATERAL MALLEOLUS 01/03/2011  . DERANGEMENT MENISCUS 02/02/2009  . KNEE PAIN 02/02/2009  . Personal history of colonic polyps 04/19/2005    Past Surgical History:  Procedure Laterality Date  . CATARACT EXTRACTION    . COLONOSCOPY    . OPEN REDUCTION INTERNAL FIXATION (ORIF) METACARPAL  09/24/2012   Procedure: OPEN REDUCTION INTERNAL FIXATION (ORIF) METACARPAL;  Surgeon: Linna Hoff, MD;  Location: [place];  Service: Orthopedics;  Laterality: Left;  and proximal phalanges.  . surgery for skin cancer          Home Medications    Prior to Admission medications   Medication Sig Start Date End Date Taking? Authorizing Provider  acetaminophen (TYLENOL) 325 MG tablet Take 2 tablets (650 mg total) by mouth every 6 (six) hours as  needed for mild pain (or Fever >/= 101). 06/03/19   Emokpae, Courage, MD  ALPRAZolam Duanne Moron) 0.5 MG tablet Take 0.5 mg by mouth 2 (two) times daily as needed for anxiety.    [provider]  apixaban (ELIQUIS) 5 MG TABS tablet Take 1 tablet (5 mg total) by mouth 2 (two) times daily. Blood thinner for stroke prevention 06/03/19   Roxan Hockey, MD  brimonidine-timolol (COMBIGAN) 0.2-0.5 % ophthalmic solution Place 1 drop into the left eye daily. 02/29/16   [provider]  buPROPion (WELLBUTRIN XL) 300 MG 24 hr tablet Take 300 mg by mouth daily.    [provider]   Cholecalciferol (VITAMIN D3) 3000 UNITS TABS Take 1 tablet by mouth daily.     [provider]  diltiazem (CARDIZEM CD) 120 MG 24 hr capsule Take 1 capsule (120 mg total) by mouth daily. 06/04/19   Roxan Hockey, MD  escitalopram (LEXAPRO) 10 MG tablet Take 10 mg by mouth daily.    [provider]  latanoprost (XALATAN) 0.005 % ophthalmic solution Place 1 drop into the left eye at bedtime. 03/15/16   [provider]  Omega-3 Fatty Acids (FISH OIL PO) Take 1 capsule by mouth daily.     [provider]  pravastatin (PRAVACHOL) 40 MG tablet Take 40 mg by mouth daily.    [provider]  ranitidine (ZANTAC) 150 MG tablet Take 150 mg by mouth.    [provider]  terazosin (HYTRIN) 1 MG capsule Take 1 mg by mouth.    [provider]  valsartan (DIOVAN) 320 MG tablet Take 320 mg by mouth daily.    [provider]  vitamin C (ASCORBIC ACID) 500 MG tablet Take 1 tablet (500 mg total) by mouth daily. 09/25/12   Iran Planas, MD    Family History Family History  Problem Relation Age of Onset  . Cancer Other        family history   . Colon cancer Neg Hx   . Colon polyps Neg Hx   . Rectal cancer Neg Hx   . Stomach cancer Neg Hx     Social History Social History   Tobacco Use  . Smoking status: Never Smoker  . Smokeless tobacco: Former Systems developer  . Tobacco comment: 10-15 yeras quit   Substance Use Topics  . Alcohol use: No    Alcohol/week: 0.0 standard drinks  . Drug use: No     Allergies   Terazosin hcl   Review of Systems Review of Systems  Constitutional: Positive for appetite change. Negative for chills, diaphoresis and fever.  Respiratory: Negative for shortness of breath.   Cardiovascular: Negative for chest pain.  Gastrointestinal: Positive for diarrhea and nausea. Negative for abdominal pain, blood in stool, constipation and vomiting.  Musculoskeletal: Negative for arthralgias, myalgias, neck pain and neck  stiffness.  Skin: Negative for rash and wound.  Neurological: Negative for dizziness, speech difficulty, weakness, light-headedness and headaches.  Hematological: Bruises/bleeds easily.  Psychiatric/Behavioral: Negative for confusion.  All other systems reviewed and are negative.    Physical Exam Updated Vital Signs BP (!) 190/86   Pulse 60   Temp 98.8 F (37.1 C)   Resp 16   Ht 5\' 10"  (1.778 m)   Wt 81.6 kg   SpO2 94%   BMI 25.83 kg/m   Physical Exam Vitals signs and nursing note reviewed.  Constitutional:      General: He is not in acute distress.    Appearance: He is  well-developed. He is not diaphoretic.    Eyes:     Comments: Right pupil reactive, extraocular movements intact, left pupil chronically dilated from injury previously.  Cardiovascular:     Rate and Rhythm: Normal rate and regular rhythm.     Pulses: Normal pulses.     Heart sounds: Normal heart sounds.  Pulmonary:     Effort: Pulmonary effort is normal.     Breath sounds: Normal breath sounds.  Abdominal:     Palpations: Abdomen is soft.     Tenderness: There is no abdominal tenderness.  Musculoskeletal:     Right lower leg: No edema.     Left lower leg: No edema.  Skin:    General: Skin is warm and dry.  Neurological:     Mental Status: He is alert and oriented to person, place, and time.  Psychiatric:        Behavior: Behavior normal.      ED Treatments / Results  Labs (all labs ordered are listed, but only abnormal results are displayed) Labs Reviewed  CBC WITH DIFFERENTIAL/PLATELET - Abnormal; Notable for the following components:      Result Value   WBC 3.4 (*)    Hemoglobin 12.6 (*)    HCT 38.4 (*)    All other components within normal limits  BASIC METABOLIC PANEL - Abnormal; Notable for the following components:   Sodium 134 (*)    Potassium 3.2 (*)    Chloride 95 (*)    Glucose, Bld 107 (*)    BUN 25 (*)    Creatinine, Ser 2.07 (*)    Calcium 8.5 (*)    GFR calc non Af  Amer 31 (*)    GFR calc Af Amer 36 (*)    All other components within normal limits  TROPONIN I (HIGH SENSITIVITY) - Abnormal; Notable for the following components:   Troponin I (High Sensitivity) 18 (*)    All other components within normal limits  TROPONIN I (HIGH SENSITIVITY)    EKG None  Radiology Ct Head Wo Contrast  Result Date: 10/23/2019 CLINICAL DATA:  Head trauma secondary to syncope. Abrasion to the forehead and scalp hematoma posteriorly. COVID-19. The patient reports being on anticoagulation therapy. EXAM: CT HEAD WITHOUT CONTRAST TECHNIQUE: Contiguous axial images were obtained from the base of the skull through the vertex without intravenous contrast. COMPARISON:  None. FINDINGS: Brain: No evidence of acute infarction, hemorrhage, hydrocephalus, extra-axial collection or mass lesion/mass effect. Diffuse mild atrophy. Vascular: No hyperdense vessel or unexpected calcification. Skull: Normal. Negative for fracture or focal lesion. Sinuses/Orbits: Extensive partial opacification of the ethmoid and sphenoid sinuses. Orbits are normal. Other: Small scalp hematoma over the left side of the frontal bone. More prominent left posterior parietal scalp hematoma. IMPRESSION: 1. No acute intracranial abnormality. 2. Scalp hematomas. 3. 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 Electronically Signed   By: Lorriane Shire M.D.   On: 10/23/2019 14:30   Dg Chest Portable 1 View  Result Date: 10/23/2019 CLINICAL DATA:  Status post fall last night. EXAM: PORTABLE CHEST 1 VIEW COMPARISON:  Single-view of the chest 06/02/2019. PA and lateral chest 09/24/2012. FINDINGS: Lungs are clear. Heart size is upper normal. No pneumothorax or pleural fluid. No acute or focal bony abnormality. IMPRESSION: No acute disease. Electronically Signed   By: Inge Rise M.D.   On: 10/23/2019 16:11    Procedures .Critical Care Performed by: Tacy Learn, PA-C Authorized by: Tacy Learn, PA-C    Critical  care provider statement:    Critical care time (minutes):  45   Critical care was time spent personally by me on the following activities:  Discussions with consultants, evaluation of patient's response to treatment, examination of patient, ordering and performing treatments and interventions, ordering and review of laboratory studies, ordering and review of radiographic studies, pulse oximetry, re-evaluation of patient's condition, obtaining history from patient or surrogate and review of old charts   (including critical care time)  Medications Ordered in ED Medications  sodium chloride 0.9 % bolus 500 mL (has no administration in time range)  potassium chloride SA (KLOR-CON) CR tablet 20 mEq (has no administration in time range)     Initial Impression / Assessment and Plan / ED Course  I have reviewed the triage vital signs and the nursing notes.  Pertinent labs & imaging results that were available during my care of the patient were reviewed by me and considered in my medical decision making (see chart for details).  Clinical Course as of Oct 23 1719  Wed Oct 23, 2019  1657 Positive Covid test verified in care everywhere records, obtained 10/22/2019.   [LM]  5262 73 year old male presents for syncopal episode with fall, hitting head on Eliquis.  CT head without significant findings.  EKG without ischemic changes, CBC with hemoglobin of 12.6, slight decreased compared to previous.  BMP with mild hyperkalemia, potassium 3.2, orally repleted.  Creatinine of 2.07, AKI with slight increase from previous, IV fluids ordered.  Initial troponin of 18, repeat is pending. Case discussed with Dr. Wyvonnia Dusky, ER attending who has seen the patient, recommends consult to hospitalist for admission, concern for syncope, unknown cause.   [LM]  1719 Case discussed with Dr. Velia Meyer, hospitalist, who will consult for admission.   [LM]    Clinical Course User Index [LM] Tacy Learn, PA-C       Final Clinical Impressions(s) / ED Diagnoses   Final diagnoses:  Syncope, unspecified syncope type  AKI (acute kidney injury) [facility])    ED Discharge Orders    None       Roque Lias 10/23/19 Arlee Muslim, MD 10/23/19 2347

## 2019-10-23 NOTE — H&P (Signed)
History and Physical    PLEASE NOTE THAT DRAGON DICTATION SOFTWARE WAS USED IN THE CONSTRUCTION OF THIS NOTE.   Jorge West [id number] DOB: [birth year] DOA: 10/23/2019  PCP: Vicenta Aly, [facility] Patient coming from: Home  I have personally briefly reviewed patient's old medical records in [place]  Chief Complaint: Syncope  HPI: Jorge West is a 73 y.o. male with medical history significant for chronic diastolic heart failure, paroxysmal atrial fibrillation chronically anticoagulated on Eliquis, hypertension, benign prostatic hyperplasia, who is admitted to [facility] on 10/23/2019 for further evaluation and management of presenting syncope after presenting from home to [facility] ED complaining of fall associated with loss of consciousness.   The patient reports that he got out of bed to use the bathroom in the very early hours on the morning of 10/23/19.  In route to the bathroom, he lost consciousness and fell to the floor, although he does not recall the act of falling.  The patient's wife was awoken by the patient's hitting the floor.  She immediately got out of bed and reports that the patient regained consciousness within a matter of a few seconds.  It is believed that the patient hit his head as the principal point of contact with the floor, although the patient believes that he lost consciousness before hitting his head.  He denies any associated chest pain, palpitations, diaphoresis.  He reports multiple months of daily episodes of loose stool, for which she has been actively following with outpatient gastroenterology, and was scheduled to undergo colonoscopy yesterday, 10/22/19, as evaluation of the loose stool.  However, preoperative COVID-19 screen performed on 10/22/2019 was found to be positive, causing delay of the endoscopic evaluation.  He reports a mild, nonproductive cough over the course of the last week, in the absence of any associated shortness of  breath.  He denies any associated subjective fever, chills, rigors, or generalized myalgias.  He also denies any associated neck stiffness, sore throat, abdominal pain, or rash.  No known COVID-19 exposures.  In addition to ongoing episodes of loose stool, the patient reports diminished appetite over the course of the last week.  He notes some mild nausea over that time, but denies any associated vomiting.  Denies any associated melena or hematochezia.  Past medical history is notable for history of BPH.  The patient reports that he was previously on a higher dose of Terazosin, however, this medication was discontinued approximately 6 weeks ago due to recurrent presyncopal episodes that had started following initiation of this alpha 1 blocker.  Following discontinuation of the higher dose of Terazosin, the patient reports subsequent resolution of presyncope.  As result, he reports that his urologist resumed Terazosin at the lower dose of 1 mg p.o. daily approximately 3 weeks ago.   Past medical history is also notable for paroxysmal atrial fibrillation for which the patient is chronically anticoagulated on Eliquis.  He is on diltiazem 120 mg p.o. daily, without any reported recent dose adjustments.  He is also on valsartan and for management of underlying essential hypertension, and denies any recent dose adjustments of his antihypertensive medication.  At the time of this evening's presentation, the patient denies any significant headache, change in vision, acute focal weakness, acute focal change in sensation, or vertigo.   ED Course: Vital signs in the emergency department were notable for the following: Temperature max 98.8; heart rate 59-65; blood pressure ranged from 117/83-150 6/89; respiratory rate 16-21, and oxygen saturation 93 to  96% on room air.  Labs in the ED were notable for the following: CMP notable for sodium 134, potassium 3.2, chloride 95, bicarbonate 27, BUN 25, creatinine 2.05  relative to most recent prior creatinine data point of 1.45 when checked on 06/03/2019, glucose 107.  CBC notable for white blood cell count of 3400 with 67% neutrophils, hemoglobin 12.6.  High-sensitivity troponin I x2 noted to be 18, followed by value of 16.  Chest x-ray showed no evidence of acute cardiopulmonary process, including no evidence of infiltrate, edema, effusion, pneumothorax.  Noncontrast CT the head, which was ordered to evaluate for intracranial hemorrhage given the presence of multiple scalp hematomas following aforementioned fall while anticoagulated on Eliquis, demonstrated no evidence of acute intracranial process, including no evidence of intracranial hemorrhage.  CT the head did show a small scalp hematoma over the left side of the frontal bone as well as a left posterior parietal scalp hematoma.  EKG showed normal sinus rhythm without evidence of acute ischemic changes.  While in the ED, the patient received potassium chloride 20 mEq p.o. x1 as well as a 500 cc IV normal saline bolus.  Given the patient's positive COVID-19 test performed on 10/22/2019 in the absence of needing PCU or ICU level care, the patient was subsequently admitted to [facility] for further evaluation and management of presenting syncope.    Review of Systems: As per HPI otherwise 10 point review of systems negative.   Past Medical History:  Diagnosis Date   Benign prostate hyperplasia    Cataract    left and removed   Depression    Depression    Diverticulosis    Erectile dysfunction    Hyperlipidemia    Hypertension    Impaired fasting glucose    Nocturia    Personal history of colonic polyps 04/19/2005    Past Surgical History:  Procedure Laterality Date   CATARACT EXTRACTION     COLONOSCOPY     OPEN REDUCTION INTERNAL FIXATION (ORIF) METACARPAL  09/24/2012   Procedure: OPEN REDUCTION INTERNAL FIXATION (ORIF) METACARPAL;  Surgeon: Linna Hoff, MD;  Location:  [place];  Service: Orthopedics;  Laterality: Left;  and proximal phalanges.   surgery for skin cancer      Social History:  reports that he has never smoked. He has quit using smokeless tobacco. He reports that he does not drink alcohol or use drugs.   Allergies  Allergen Reactions   Terazosin Hcl     Dizziness and hypotension    Family History  Problem Relation Age of Onset   Cancer Other        family history    Colon cancer Neg Hx    Colon polyps Neg Hx    Rectal cancer Neg Hx    Stomach cancer Neg Hx      Prior to Admission medications   Medication Sig Start Date End Date Taking? Authorizing Provider  acetaminophen (TYLENOL) 325 MG tablet Take 2 tablets (650 mg total) by mouth every 6 (six) hours as needed for mild pain (or Fever >/= 101). 06/03/19   Emokpae, Courage, MD  ALPRAZolam Duanne Moron) 0.5 MG tablet Take 0.5 mg by mouth 2 (two) times daily as needed for anxiety.    [provider]  apixaban (ELIQUIS) 5 MG TABS tablet Take 1 tablet (5 mg total) by mouth 2 (two) times daily. Blood thinner for stroke prevention 06/03/19   Roxan Hockey, MD  brimonidine-timolol (COMBIGAN) 0.2-0.5 % ophthalmic solution Place 1  drop into the left eye daily. 02/29/16   [provider]  buPROPion (WELLBUTRIN XL) 300 MG 24 hr tablet Take 300 mg by mouth daily.    [provider]  Cholecalciferol (VITAMIN D3) 3000 UNITS TABS Take 1 tablet by mouth daily.     [provider]  diltiazem (CARDIZEM CD) 120 MG 24 hr capsule Take 1 capsule (120 mg total) by mouth daily. 06/04/19   Roxan Hockey, MD  escitalopram (LEXAPRO) 10 MG tablet Take 10 mg by mouth daily.    [provider]  latanoprost (XALATAN) 0.005 % ophthalmic solution Place 1 drop into the left eye at bedtime. 03/15/16   [provider]  Omega-3 Fatty Acids (FISH OIL PO) Take 1 capsule by mouth daily.     [provider]  pravastatin (PRAVACHOL) 40 MG tablet Take 40 mg by  mouth daily.    [provider]  ranitidine (ZANTAC) 150 MG tablet Take 150 mg by mouth.    [provider]  terazosin (HYTRIN) 1 MG capsule Take 1 mg by mouth.    [provider]  valsartan (DIOVAN) 320 MG tablet Take 320 mg by mouth daily.    [provider]  vitamin C (ASCORBIC ACID) 500 MG tablet Take 1 tablet (500 mg total) by mouth daily. 09/25/12   Iran Planas, MD     Objective    Physical Exam: Vitals:   10/23/19 1600 10/23/19 1615 10/23/19 1630 10/23/19 1700  BP: (!) 161/80  (!) 167/84 (!) 190/86  Pulse:  (!) 59 60 60  Resp:      Temp:      SpO2:  93% 92% 94%  Weight:      Height:        General: appears to be stated age; alert, oriented Skin: warm, dry; scalp hematomas, as noted above. Head: scalp hematomas x 2, as noted; not evidence of active bleed;  Mouth:  Oral mucosa membranes appear dry, normal dentition Neck: supple; trachea midline Heart:  RRR; did not appreciate any M/R/G Lungs: CTAB, did not appreciate any wheezes, rales, or rhonchi Abdomen: + BS; soft, ND, NT Vascular: 2+ pedal pulses b/l; 2+ radial pulses b/l Extremities: no peripheral edema, no muscle wasting Neuro: strength and sensation intact in upper and lower extremities b/l   Labs on Admission: I have personally reviewed following labs and imaging studies  CBC: Recent Labs  Lab 10/23/19 1348  WBC 3.4*  NEUTROABS 2.2  HGB 12.6*  HCT 38.4*  MCV 89.1  PLT 0000000   Basic Metabolic Panel: Recent Labs  Lab 10/23/19 1348  NA 134*  K 3.2*  CL 95*  CO2 27  GLUCOSE 107*  BUN 25*  CREATININE 2.07*  CALCIUM 8.5*   GFR: Estimated Creatinine Clearance: 32.8 mL/min (A) (by C-G formula based on SCr of 2.07 mg/dL (H)). Liver Function Tests: No results for input(s): AST, ALT, ALKPHOS, BILITOT, PROT, ALBUMIN in the last 168 hours. No results for input(s): LIPASE, AMYLASE in the last 168 hours. No results for input(s): AMMONIA in the last 168  hours. Coagulation Profile: No results for input(s): INR, PROTIME in the last 168 hours. Cardiac Enzymes: No results for input(s): CKTOTAL, CKMB, CKMBINDEX, TROPONINI in the last 168 hours. BNP (last 3 results) No results for input(s): PROBNP in the last 8760 hours. HbA1C: No results for input(s): HGBA1C in the last 72 hours. CBG: No results for input(s): GLUCAP in the last 168 hours. Lipid Profile: No results for input(s): CHOL, HDL,  LDLCALC, TRIG, CHOLHDL, LDLDIRECT in the last 72 hours. Thyroid Function Tests: No results for input(s): TSH, T4TOTAL, FREET4, T3FREE, THYROIDAB in the last 72 hours. Anemia Panel: No results for input(s): VITAMINB12, FOLATE, FERRITIN, TIBC, IRON, RETICCTPCT in the last 72 hours. Urine analysis: No results found for: COLORURINE, APPEARANCEUR, LABSPEC, [place], GLUCOSEU, HGBUR, BILIRUBINUR, KETONESUR, PROTEINUR, UROBILINOGEN, NITRITE, LEUKOCYTESUR  Radiological Exams on Admission: Ct Head Wo Contrast  Result Date: 10/23/2019 CLINICAL DATA:  Head trauma secondary to syncope. Abrasion to the forehead and scalp hematoma posteriorly. COVID-19. The patient reports being on anticoagulation therapy. EXAM: CT HEAD WITHOUT CONTRAST TECHNIQUE: Contiguous axial images were obtained from the base of the skull through the vertex without intravenous contrast. COMPARISON:  None. FINDINGS: Brain: No evidence of acute infarction, hemorrhage, hydrocephalus, extra-axial collection or mass lesion/mass effect. Diffuse mild atrophy. Vascular: No hyperdense vessel or unexpected calcification. Skull: Normal. Negative for fracture or focal lesion. Sinuses/Orbits: Extensive partial opacification of the ethmoid and sphenoid sinuses. Orbits are normal. Other: Small scalp hematoma over the left side of the frontal bone. More prominent left posterior parietal scalp hematoma. IMPRESSION: 1. No acute intracranial abnormality. 2. Scalp hematomas. 3. 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 Electronically Signed   By: Lorriane Shire M.D.   On: 10/23/2019 14:30   Dg Chest Portable 1 View  Result Date: 10/23/2019 CLINICAL DATA:  Status post fall last night. EXAM: PORTABLE CHEST 1 VIEW COMPARISON:  Single-view of the chest 06/02/2019. PA and lateral chest 09/24/2012. FINDINGS: Lungs are clear. Heart size is upper normal. No pneumothorax or pleural fluid. No acute or focal bony abnormality. IMPRESSION: No acute disease. Electronically Signed   By: Inge Rise M.D.   On: 10/23/2019 16:11   EKG: Independently reviewed, with result as described above.   Assessment/Plan   Jorge West is a 73 y.o. male with medical history significant for chronic diastolic heart failure, paroxysmal atrial fibrillation chronically anticoagulated on Eliquis, hypertension, benign prostatic hyperplasia, who is admitted to [facility] on 10/23/2019 for further evaluation and management of presenting syncope after presenting from home to [facility] ED complaining of fall associated with loss of consciousness.    Principal Problem:   Syncope Active Problems:   AF (paroxysmal atrial fibrillation) (HCC)   HTN (hypertension)   Chronic diastolic CHF (congestive heart failure) (HCC)/EF 60 to 65 %   COVID-19 virus infection   AKI (acute kidney injury) ([place])   Hypokalemia  #) Syncope: Suspect orthostatic hypotension in the setting of dehydration stemming from 1 month of increased GI losses in the form of daily loose stool as well as more recent diminished oral intake, with complicating loss of compensatory tachycardia and peripheral vasoconstriction due to outpatient pharmacologic factors, namely diltiazem, valsartan, and Terazosin.  This appears to be further substantiated by the patient's report of a history of presyncope while on Terazosin, while noting that this medication was just resumed 3 weeks ago, as above.  Of note, formal quantitative confirmation of orthostatic hypotension may be  difficult given that the patient has already received IV fluids in the emergency department prior to assessment of orthostatic vital signs.  In terms of other possibilities leading to patient's syncope, ACS appears less likely, particularly following negative troponin x2 values in the ED as well as EKG showing no evidence of acute ischemic changes.  Presentation is also less suggestive of stroke versus seizure.   Plan: Check and document orthostatic vital signs now, with the caveat that the patient has already  received IV fluids and ministered in the ED.  Hold home diltiazem, Terazosin, and valsartan for now.  Monitor on telemetry.  Monitor strict I's and O's and daily weights.     #) COVID-19 pneumonia: Diagnosis on the basis of 1 week of new onset nonproductive cough, with outpatient COVID-19 testing performed on 10/22/2019 found to be positive.  Presentation is not associated with any hypoxia, and therefore patient's COVID-19 infection does not meet criteria to be considered severe.  As result, there is not currently a recommendation for initiation of Decadron or remdesivir.  Chest x-ray shows no evidence of acute cardiopulmonary process.  The patient denies any chronic underlying pulmonary conditions.   Plan: As the patient does not require PCU or ICU level care at this time, will admit to [facility], while acknowledging that presenting syncope is the admitting diagnosis.  Monitor continuous pulse oximetry.  As needed supplemental oxygen order to maintain oxygen saturations greater than or equal to 92%.  Will add on general inflammatory markers to labs are to collected in the ED.  Repeat CBC with differential in the morning.  Airborne and contact precautions with protection.     #) Acute kidney injury: Presenting creatinine noted to be 2.07 relative to most recent prior creatinine data point of 1.45 on 06/03/2019.  Suspect that this is prerenal in nature on the basis of intravascular  depletion stemming from dehydration as a result of 1 month of daily loose stool as well as more recent diminished oral intake, as described above, and further complicated by presence of outpatient valsartan.   Plan: Check urinalysis, with attention for the presence of urinary cast.  Check random urine sodium as well as random urine creatinine.  Hold home valsartan.  Monitor strict I's and O's and daily weights and attempt to avoid nephrotoxic agents.  After obtaining and documenting orthostatic vital signs, will initiate gentle IV fluids overnight, with repeat BMP in the morning.     #) Hypokalemia: Presenting serum potassium noted to be 3.2, which appears to be in the basis of increased GI losses in the form of 1 month of daily loose stools.  The patient received potassium chloride 20 mEq p.o. x1 in the ED this evening.  Plan: Add on serum magnesium level to labs already collected in the ED.  Repeat BMP and serum magnesium levels in the morning.  Gentle IV fluids overnight with lactated Ringer's.       #) Chronic diastolic heart failure: Most recent echocardiogram, which was performed in July 2020, showed mild LVH, EF 60 to 65%, impaired relaxation, mildly dilated left atrium, and no evidence of significant valvular pathology.  The patient is on no chronic diuretic therapy at home, and there is no evidence of acutely decompensated heart failure at the time of this evening's presentation.  Plan: Monitor strict I's and O's and daily weights repeat BMP in the morning.     #) Paroxysmal atrial fibrillation: In the setting of CHA2DS2-VASc score of 3, the patient is chronically anticoagulated on Eliquis.  He is on daily diltiazem as his outpatient AV nodal blocker, although heart rate in the ED appears borderline bradycardic.  In the setting of presenting syncope in which pharmacologic inhibition of compensatory tachycardia was felt to contribute to patient's syncope, and particularly given  borderline bradycardia observed in the ED, will hold home diltiazem for now.  Plan: Hold home diltiazem for now, as above.  Monitor on telemetry.  Continue home Eliquis.     #)  Benign prostatic hyperplasia: Lower dose Terazosin was started 3 weeks ago, after the patient experienced recurrent presyncopal episodes while on higher dose of Terazosin.  Of note, these presyncopal episodes resolved for the period of time which the patient was completely off Terazosin.  Suspect that even this lower dose of Terazosin contributed to patient's presenting syncope, as further described above.  Plan: We will hold home Terazosin for now.  Monitor strict I's and O's.  Repeat BMP in the morning.    DVT prophylaxis: Continue home Eliquis. Code Status: Full code Family Communication: None Disposition Plan:  Per Rounding Team Consults called: None Admission status: Observation to med/tele at [facility];     PLEASE NOTE THAT DRAGON DICTATION SOFTWARE WAS USED IN THE CONSTRUCTION OF THIS NOTE.   [facility] Pager [phone] From 3PM- 11PM.   Otherwise, please contact night-coverage  [email or web address] Password [facility]  10/23/2019, 5:12 PM

## 2019-10-23 NOTE — ED Notes (Signed)
Pt provided bedside commode 

## 2019-10-23 NOTE — ED Triage Notes (Signed)
Pt states that sometime during the night he got up to use the restroom and fell, reports that he does not remember falling, pt has abrasion to forehead area and a hematoma to back of head, pt also reports that he tested positive for covid when he had the routine screening test performed for his colonoscopy that was scheduled this week, pt states that he has been having problems with diarrhea and that was why they were doing the colonoscopy.

## 2019-10-23 NOTE — ED Notes (Signed)
ekg given to Dr Rogene Houston

## 2019-10-24 DIAGNOSIS — E876 Hypokalemia: Secondary | ICD-10-CM | POA: Diagnosis present

## 2019-10-24 DIAGNOSIS — I951 Orthostatic hypotension: Secondary | ICD-10-CM | POA: Diagnosis present

## 2019-10-24 DIAGNOSIS — W1830XA Fall on same level, unspecified, initial encounter: Secondary | ICD-10-CM | POA: Diagnosis present

## 2019-10-24 DIAGNOSIS — N1831 Chronic kidney disease, stage 3a: Secondary | ICD-10-CM | POA: Diagnosis present

## 2019-10-24 DIAGNOSIS — N179 Acute kidney failure, unspecified: Secondary | ICD-10-CM | POA: Diagnosis present

## 2019-10-24 DIAGNOSIS — I480 Paroxysmal atrial fibrillation: Secondary | ICD-10-CM | POA: Diagnosis present

## 2019-10-24 DIAGNOSIS — R55 Syncope and collapse: Secondary | ICD-10-CM | POA: Diagnosis not present

## 2019-10-24 DIAGNOSIS — U071 COVID-19: Secondary | ICD-10-CM | POA: Diagnosis present

## 2019-10-24 DIAGNOSIS — S0003XA Contusion of scalp, initial encounter: Secondary | ICD-10-CM | POA: Diagnosis present

## 2019-10-24 DIAGNOSIS — Z7901 Long term (current) use of anticoagulants: Secondary | ICD-10-CM | POA: Diagnosis not present

## 2019-10-24 DIAGNOSIS — Z87891 Personal history of nicotine dependence: Secondary | ICD-10-CM | POA: Diagnosis not present

## 2019-10-24 DIAGNOSIS — I5032 Chronic diastolic (congestive) heart failure: Secondary | ICD-10-CM | POA: Diagnosis present

## 2019-10-24 DIAGNOSIS — Z9842 Cataract extraction status, left eye: Secondary | ICD-10-CM | POA: Diagnosis not present

## 2019-10-24 DIAGNOSIS — Z79899 Other long term (current) drug therapy: Secondary | ICD-10-CM | POA: Diagnosis not present

## 2019-10-24 DIAGNOSIS — Z85828 Personal history of other malignant neoplasm of skin: Secondary | ICD-10-CM | POA: Diagnosis not present

## 2019-10-24 DIAGNOSIS — E860 Dehydration: Secondary | ICD-10-CM | POA: Diagnosis present

## 2019-10-24 DIAGNOSIS — Z8719 Personal history of other diseases of the digestive system: Secondary | ICD-10-CM | POA: Diagnosis not present

## 2019-10-24 DIAGNOSIS — N400 Enlarged prostate without lower urinary tract symptoms: Secondary | ICD-10-CM | POA: Diagnosis present

## 2019-10-24 DIAGNOSIS — Z87898 Personal history of other specified conditions: Secondary | ICD-10-CM | POA: Diagnosis not present

## 2019-10-24 DIAGNOSIS — S0181XA Laceration without foreign body of other part of head, initial encounter: Secondary | ICD-10-CM | POA: Diagnosis present

## 2019-10-24 DIAGNOSIS — K529 Noninfective gastroenteritis and colitis, unspecified: Secondary | ICD-10-CM | POA: Diagnosis present

## 2019-10-24 DIAGNOSIS — E785 Hyperlipidemia, unspecified: Secondary | ICD-10-CM | POA: Diagnosis present

## 2019-10-24 LAB — CBC WITH DIFFERENTIAL/PLATELET
Abs Immature Granulocytes: 0.01 10*3/uL (ref 0.00–0.07)
Basophils Absolute: 0 10*3/uL (ref 0.0–0.1)
Basophils Relative: 1 %
Eosinophils Absolute: 0 10*3/uL (ref 0.0–0.5)
Eosinophils Relative: 1 %
HCT: 36.1 % — ABNORMAL LOW (ref 39.0–52.0)
Hemoglobin: 12.1 g/dL — ABNORMAL LOW (ref 13.0–17.0)
Immature Granulocytes: 0 %
Lymphocytes Relative: 30 %
Lymphs Abs: 1.1 10*3/uL (ref 0.7–4.0)
MCH: 29.1 pg (ref 26.0–34.0)
MCHC: 33.5 g/dL (ref 30.0–36.0)
MCV: 86.8 fL (ref 80.0–100.0)
Monocytes Absolute: 0.4 10*3/uL (ref 0.1–1.0)
Monocytes Relative: 10 %
Neutro Abs: 2.1 10*3/uL (ref 1.7–7.7)
Neutrophils Relative %: 58 %
Platelets: 165 10*3/uL (ref 150–400)
RBC: 4.16 MIL/uL — ABNORMAL LOW (ref 4.22–5.81)
RDW: 12.2 % (ref 11.5–15.5)
WBC: 3.6 10*3/uL — ABNORMAL LOW (ref 4.0–10.5)
nRBC: 0 % (ref 0.0–0.2)

## 2019-10-24 LAB — MAGNESIUM: Magnesium: 1.9 mg/dL (ref 1.7–2.4)

## 2019-10-24 LAB — COMPREHENSIVE METABOLIC PANEL
ALT: 22 U/L (ref 0–44)
AST: 26 U/L (ref 15–41)
Albumin: 2.6 g/dL — ABNORMAL LOW (ref 3.5–5.0)
Alkaline Phosphatase: 52 U/L (ref 38–126)
Anion gap: 10 (ref 5–15)
BUN: 24 mg/dL — ABNORMAL HIGH (ref 8–23)
CO2: 26 mmol/L (ref 22–32)
Calcium: 8.4 mg/dL — ABNORMAL LOW (ref 8.9–10.3)
Chloride: 99 mmol/L (ref 98–111)
Creatinine, Ser: 2.02 mg/dL — ABNORMAL HIGH (ref 0.61–1.24)
GFR calc Af Amer: 37 mL/min — ABNORMAL LOW (ref >60)
GFR calc non Af Amer: 32 mL/min — ABNORMAL LOW (ref >60)
Glucose, Bld: 87 mg/dL (ref 70–99)
Potassium: 3.2 mmol/L — ABNORMAL LOW (ref 3.5–5.1)
Sodium: 135 mmol/L (ref 135–145)
Total Bilirubin: 0.6 mg/dL (ref 0.3–1.2)
Total Protein: 6.4 g/dL — ABNORMAL LOW (ref 6.5–8.1)

## 2019-10-24 LAB — C DIFFICILE QUICK SCREEN W PCR REFLEX
C Diff antigen: NEGATIVE
C Diff interpretation: NOT DETECTED
C Diff toxin: NEGATIVE

## 2019-10-24 LAB — PHOSPHORUS: Phosphorus: 3.5 mg/dL (ref 2.5–4.6)

## 2019-10-24 MED ORDER — ENSURE ENLIVE PO LIQD
237.0000 mL | Freq: Two times a day (BID) | ORAL | Status: DC
  Administered 2019-10-24 – 2019-10-25 (×2): 237 mL via ORAL

## 2019-10-24 MED ORDER — POTASSIUM CHLORIDE CRYS ER 20 MEQ PO TBCR
40.0000 meq | EXTENDED_RELEASE_TABLET | Freq: Two times a day (BID) | ORAL | Status: AC
  Administered 2019-10-24 (×2): 40 meq via ORAL
  Filled 2019-10-24 (×2): qty 2

## 2019-10-24 MED ORDER — ADULT MULTIVITAMIN W/MINERALS CH
1.0000 | ORAL_TABLET | Freq: Every day | ORAL | Status: DC
  Administered 2019-10-24 – 2019-10-25 (×2): 1 via ORAL
  Filled 2019-10-24 (×2): qty 1

## 2019-10-24 MED ORDER — BRIMONIDINE TARTRATE 0.2 % OP SOLN
1.0000 [drp] | Freq: Every day | OPHTHALMIC | Status: DC
  Administered 2019-10-25: 1 [drp] via OPHTHALMIC
  Filled 2019-10-24: qty 5

## 2019-10-24 MED ORDER — TIMOLOL MALEATE 0.5 % OP SOLN
1.0000 [drp] | Freq: Every day | OPHTHALMIC | Status: DC
  Administered 2019-10-25: 1 [drp] via OPHTHALMIC
  Filled 2019-10-24: qty 5

## 2019-10-24 MED ORDER — SODIUM CHLORIDE 0.9 % IV SOLN: INTRAVENOUS | Status: DC

## 2019-10-24 MED ORDER — LACTATED RINGERS IV SOLN: INTRAVENOUS | Status: AC

## 2019-10-24 NOTE — Progress Notes (Signed)
Chaplain had the nurse give the patient paperwork regarding an advanced directive.  Chaplain will follow-up at the completion of the paperwork.  Brion Aliment Chaplain Resident For questions concerning this note please contact me by pager [phone]

## 2019-10-24 NOTE — Progress Notes (Signed)
Initial Nutrition Assessment  INTERVENTION:   -Ensure Enlive po BID, each supplement provides 350 kcal and 20 grams of protein -Multivitamin with minerals daily  NUTRITION DIAGNOSIS:   Increased nutrient needs related to acute illness as evidenced by estimated needs.  GOAL:   Patient will meet greater than or equal to 90% of their needs  MONITOR:   PO intake, Supplement acceptance, Labs, Weight trends, I & O's  REASON FOR ASSESSMENT:   Malnutrition Screening Tool    ASSESSMENT:   73 y.o. male with medical history significant for chronic diastolic heart failure, paroxysmal atrial fibrillation chronically anticoagulated on Eliquis, hypertension, benign prostatic hyperplasia, who is admitted to [facility] on 10/23/2019 for further evaluation and management of presenting syncope after presenting from home to [facility] ED complaining of fall associated with loss of consciousness. 12/3: COVID+  **RD working remotely**  Patient reports mild cough as main symptom of COVID-19 infection. Pt has had mild nausea which has impacted his appetite the past week and pt has had loose stools x 1 month now.  RD will add Ensure supplements and daily MVI given active COVID-19 infection.  Per weight records, pt has lost 22 lbs since 7/13 (10% wt loss x 4.5 months, significant for time frame).   I/Os: +783 ml since admit UOP: 400 ml x 24 hrs  Medications reviewed. Labs reviewed: Low K  NUTRITION - FOCUSED PHYSICAL EXAM:  Working remotely.  Diet Order:   Diet Order            Diet regular Room service appropriate? Yes; Fluid consistency: Thin  Diet effective now              EDUCATION NEEDS:   No education needs have been identified at this time  Skin:  Skin Assessment: Skin Integrity Issues: Skin Integrity Issues:: Other (Comment) Other: head laceration  Last BM:  12/2 -type 7  Height:   Ht Readings from Last 1 Encounters:  10/24/19 5\' 10"  (1.778 m)    Weight:    Wt Readings from Last 1 Encounters:  10/24/19 82.4 kg    Ideal Body Weight:  75.5 kg  BMI:  Body mass index is 26.07 kg/m.  Estimated Nutritional Needs:   Kcal:  2000-2200  Protein:  95-105g  Fluid:  2L/day  Clayton Bibles, MS, RD, LDN Inpatient Clinical Dietitian Pager: [phone] After Hours Pager: [phone]

## 2019-10-24 NOTE — Progress Notes (Signed)
PROGRESS NOTE    Jorge West  [id number] DOB: [birth year] DOA: 10/23/2019 PCP: Vicenta Aly, FNP    Brief Narrative:  73 year old male with history of chronic diastolic heart failure, paroxysmal A. fib on Eliquis, hypertension, BPH who presented to the emergency room with episode of syncope and fall associated with loss of consciousness.  Apparently, patient is having chronic diarrhea for more than a month and has been evaluated by gastroenterologist and was planned for EGD colonoscopy tomorrow. According to the patient, he was in route to bathroom, felt dizzy lost consciousness and fell on the floor.  Wife found him, brought to the ER.  Had transient unresponsiveness, woke up after fall. In the emergency room, blood pressures low, acute kidney injury, skeletal survey shows some scalp hematoma but no intracranial injury.  COVID-19 was positive on preoperative screening. Because of COVID-19 positivity, transferred to [facility] from [facility].  Assessment & Plan:   Principal Problem:   Syncope Active Problems:   AF (paroxysmal atrial fibrillation) (HCC)   HTN (hypertension)   Chronic diastolic CHF (congestive heart failure) (HCC)/EF 60 to 65 %   COVID-19 virus infection   AKI (acute kidney injury) (HCC)   Hypokalemia   Syncope and collapse  Syncope and collapse: With positive orthostasis.  Patient with ongoing diarrhea and poor oral intake, ongoing use of valsartan and Terazosin.  No evidence of arrhythmias on telemetry.  Sinus rhythm at all throughout. Continue low-dose maintenance IV fluids.  Check orthostatic.  Discontinue all antihypertensives and Terazosin.  Paroxysmal A. fib: Currently sinus rhythm.  Rate controlled.  Therapeutic on Eliquis.  Colonoscopy was rescheduled, continue Eliquis.  Fall: No skeletal injury.  No intracranial injury.  Continue with Eliquis.  Hypertension: Blood pressures low normal, low.  Discontinue all antihypertensives.  Acute  kidney injury on chronic kidney disease stage IIIa: Baseline creatinine unknown about 1.4.  Due to #1.  Continue low-dose IV fluids.  We will recheck levels tomorrow morning.  COVID-19 viral infection: Patient currently with no pulmonary symptoms.  Monitor.  No indication to restart COVID-19 directed therapies at this time.  Hypokalemia: Replace and monitor levels.  Chronic diarrhea: Extensively investigated as outpatient.  Reportedly negative stool studies.  Had one loose stool on night.  If he has more recurrent stool, will check C. difficile.  No recent antibiotic use.   DVT prophylaxis: Eliquis Code Status: Full code Family Communication: Wife on the phone Disposition Plan: Home after medical stabilization.  Anticipate tomorrow.  Patient continues to have low blood pressure and positive orthostatic vital signs.  He will need ongoing IV fluids, monitoring of renal functions.  Anticipate hospitalization more than 2 midnights.   Consultants:   None  Procedures:   None  Antimicrobials:   None   Subjective: Patient seen and examined.  On my evaluation, he was feeling okay.  Afebrile overnight.  He had one loose stool overnight.  Denies any nausea vomiting or abdominal pain.  Appetite is fair.  Objective: Vitals:   10/24/19 0044 10/24/19 0214 10/24/19 0500 10/24/19 0752  BP:  124/83  100/65  Pulse:  76  68  Resp:  (!) 24    Temp:  97.7 F (36.5 C)  98.8 F (37.1 C)  TempSrc:  Oral  Oral  SpO2: 93% 91%  96%  Weight:  81.8 kg 82.4 kg   Height:  5\' 10"  (1.778 m)      Intake/Output Summary (Last 24 hours) at 10/24/2019 1425 Last data filed at 10/24/2019 1000  Gross per 24 hour  Intake 1183.29 ml  Output 400 ml  Net 783.29 ml   Filed Weights   10/23/19 1318 10/24/19 0214 10/24/19 0500  Weight: 81.6 kg 81.8 kg 82.4 kg    Examination:  General exam: Appears calm and comfortable, on room air. Respiratory system: Clear to auscultation. Respiratory effort normal.  No  added sounds. Cardiovascular system: S1 & S2 heard, RRR. No JVD, murmurs, rubs, gallops or clicks. No pedal edema. Gastrointestinal system: Abdomen is nondistended, soft and nontender. No organomegaly or masses felt. Normal bowel sounds heard. Central nervous system: Alert and oriented. No focal neurological deficits. Extremities: Symmetric 5 x 5 power. Skin: No rashes, lesions or ulcers Psychiatry: Judgement and insight appear normal. Mood & affect appropriate.     Data Reviewed: I have personally reviewed following labs and imaging studies  CBC: Recent Labs  Lab 10/23/19 1348 10/24/19 0837  WBC 3.4* 3.6*  NEUTROABS 2.2 2.1  HGB 12.6* 12.1*  HCT 38.4* 36.1*  MCV 89.1 86.8  PLT 176 [id number]   Basic Metabolic Panel: Recent Labs  Lab 10/23/19 1348 10/23/19 1742 10/24/19 0837  NA 134*  --  135  K 3.2*  --  3.2*  CL 95*  --  99  CO2 27  --  26  GLUCOSE 107*  --  87  BUN 25*  --  24*  CREATININE 2.07*  --  2.02*  CALCIUM 8.5*  --  8.4*  MG  --  2.1 1.9  PHOS  --   --  3.5   GFR: Estimated Creatinine Clearance: 33.6 mL/min (A) (by C-G formula based on SCr of 2.02 mg/dL (H)). Liver Function Tests: Recent Labs  Lab 10/24/19 0837  AST 26  ALT 22  ALKPHOS 52  BILITOT 0.6  PROT 6.4*  ALBUMIN 2.6*   No results for input(s): LIPASE, AMYLASE in the last 168 hours. No results for input(s): AMMONIA in the last 168 hours. Coagulation Profile: No results for input(s): INR, PROTIME in the last 168 hours. Cardiac Enzymes: No results for input(s): CKTOTAL, CKMB, CKMBINDEX, TROPONINI in the last 168 hours. BNP (last 3 results) No results for input(s): PROBNP in the last 8760 hours. HbA1C: No results for input(s): HGBA1C in the last 72 hours. CBG: No results for input(s): GLUCAP in the last 168 hours. Lipid Profile: No results for input(s): CHOL, HDL, LDLCALC, TRIG, CHOLHDL, LDLDIRECT in the last 72 hours. Thyroid Function Tests: No results for input(s): TSH, T4TOTAL, FREET4,  T3FREE, THYROIDAB in the last 72 hours. Anemia Panel: Recent Labs    10/23/19 1742  FERRITIN 472*   Sepsis Labs: No results for input(s): PROCALCITON, LATICACIDVEN in the last 168 hours.  No results found for this or any previous visit (from the past 240 hour(s)).       Radiology Studies: Ct Head Wo Contrast  Result Date: 10/23/2019 CLINICAL DATA:  Head trauma secondary to syncope. Abrasion to the forehead and scalp hematoma posteriorly. COVID-19. The patient reports being on anticoagulation therapy. EXAM: CT HEAD WITHOUT CONTRAST TECHNIQUE: Contiguous axial images were obtained from the base of the skull through the vertex without intravenous contrast. COMPARISON:  None. FINDINGS: Brain: No evidence of acute infarction, hemorrhage, hydrocephalus, extra-axial collection or mass lesion/mass effect. Diffuse mild atrophy. Vascular: No hyperdense vessel or unexpected calcification. Skull: Normal. Negative for fracture or focal lesion. Sinuses/Orbits: Extensive partial opacification of the ethmoid and sphenoid sinuses. Orbits are normal. Other: Small scalp hematoma over the left side of the frontal bone. More  prominent left posterior parietal scalp hematoma. IMPRESSION: 1. No acute intracranial abnormality. 2. Scalp hematomas. 3. 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 Electronically Signed   By: Lorriane Shire M.D.   On: 10/23/2019 14:30   Dg Chest Portable 1 View  Result Date: 10/23/2019 CLINICAL DATA:  Status post fall last night. EXAM: PORTABLE CHEST 1 VIEW COMPARISON:  Single-view of the chest 06/02/2019. PA and lateral chest 09/24/2012. FINDINGS: Lungs are clear. Heart size is upper normal. No pneumothorax or pleural fluid. No acute or focal bony abnormality. IMPRESSION: No acute disease. Electronically Signed   By: Inge Rise M.D.   On: 10/23/2019 16:11        Scheduled Meds: . apixaban  5 mg Oral BID  . brimonidine-timolol  1 drop Left Eye QHS  . buPROPion  300 mg  Oral Daily  . escitalopram  10 mg Oral Daily  . feeding supplement (ENSURE ENLIVE)  237 mL Oral BID BM  . latanoprost  1 drop Left Eye QHS  . multivitamin with minerals  1 tablet Oral Daily  . potassium chloride SA  40 mEq Oral BID  . pravastatin  40 mg Oral q1800  . sodium chloride flush  3 mL Intravenous Q12H   Continuous Infusions: . lactated ringers       LOS: 0 days    Time spent: 30 minutes    Barb Merino, MD [facility] Pager [phone]

## 2019-10-25 LAB — CBC WITH DIFFERENTIAL/PLATELET
Abs Immature Granulocytes: 0.01 10*3/uL (ref 0.00–0.07)
Basophils Absolute: 0 10*3/uL (ref 0.0–0.1)
Basophils Relative: 1 %
Eosinophils Absolute: 0 10*3/uL (ref 0.0–0.5)
Eosinophils Relative: 1 %
HCT: 36.9 % — ABNORMAL LOW (ref 39.0–52.0)
Hemoglobin: 12.7 g/dL — ABNORMAL LOW (ref 13.0–17.0)
Immature Granulocytes: 0 %
Lymphocytes Relative: 27 %
Lymphs Abs: 1 10*3/uL (ref 0.7–4.0)
MCH: 29.7 pg (ref 26.0–34.0)
MCHC: 34.4 g/dL (ref 30.0–36.0)
MCV: 86.4 fL (ref 80.0–100.0)
Monocytes Absolute: 0.3 10*3/uL (ref 0.1–1.0)
Monocytes Relative: 9 %
Neutro Abs: 2.3 10*3/uL (ref 1.7–7.7)
Neutrophils Relative %: 62 %
Platelets: 174 10*3/uL (ref 150–400)
RBC: 4.27 MIL/uL (ref 4.22–5.81)
RDW: 12.1 % (ref 11.5–15.5)
WBC: 3.7 10*3/uL — ABNORMAL LOW (ref 4.0–10.5)
nRBC: 0 % (ref 0.0–0.2)

## 2019-10-25 LAB — BASIC METABOLIC PANEL
Anion gap: 10 (ref 5–15)
BUN: 25 mg/dL — ABNORMAL HIGH (ref 8–23)
CO2: 26 mmol/L (ref 22–32)
Calcium: 8.6 mg/dL — ABNORMAL LOW (ref 8.9–10.3)
Chloride: 101 mmol/L (ref 98–111)
Creatinine, Ser: 1.77 mg/dL — ABNORMAL HIGH (ref 0.61–1.24)
GFR calc Af Amer: 43 mL/min — ABNORMAL LOW (ref >60)
GFR calc non Af Amer: 37 mL/min — ABNORMAL LOW (ref >60)
Glucose, Bld: 90 mg/dL (ref 70–99)
Potassium: 3.8 mmol/L (ref 3.5–5.1)
Sodium: 137 mmol/L (ref 135–145)

## 2019-10-25 NOTE — Progress Notes (Signed)
Pt d/c from unit.

## 2019-10-25 NOTE — Progress Notes (Signed)
Spoke with patient wife. Approximated time of arrival 12 pm.

## 2019-10-25 NOTE — Discharge Summary (Signed)
Physician Discharge Summary  MAXTYN AVENT [id number] DOB: 09/28/1946 DOA: 10/23/2019  PCP: Vicenta Aly, FNP  Admit date: 10/23/2019 Discharge date: 10/25/2019  Admitted From: home  Disposition:  Home   Recommendations for Outpatient Follow-up:  1. Follow up with PCP in 1-2 weeks 2. Please obtain BMP/CBC in one week  Home Health:NA  Equipment/Devices:NA   Discharge Condition:stable   CODE STATUS:full code  Diet recommendation: Regular diet  Discharge summary: 73 year old male with history of chronic diastolic heart failure, paroxysmal A. fib on Eliquis, hypertension, BPH who presented to the emergency room with episode of syncope and fall associated with loss of consciousness.  Apparently, patient is having chronic diarrhea for more than a month and has been evaluated by a gastroenterologist and was planned for EGD colonoscopy this week.  According to the patient, he was in route to bathroom, felt dizzy lost consciousness and fell on the floor.  Wife found him, brought to the ER.  Had transient unresponsiveness, woke up after fall. In the emergency room, blood pressures low, acute kidney injury, skeletal survey shows some scalp hematoma but no intracranial injury.  COVID-19 was positive on preoperative screening. Because of COVID-19 positivity, transferred to [facility] from [facility].  Syncope and collapse: With positive orthostasis.  Patient with ongoing diarrhea and poor oral intake, ongoing use of valsartan and Terazosin.  No evidence of arrhythmias on telemetry.  Sinus rhythm at all throughout.  Treated with IV fluids.  Currently blood pressure is adequate.  No orthostatic changes.  Discontinue all antihypertensives and Terazosin.  Paroxysmal A. fib: Currently sinus rhythm.  Rate controlled.  Therapeutic on Eliquis.  Colonoscopy was rescheduled, continue Eliquis.  Fall: No skeletal injury.  No intracranial injury.  Continue with Eliquis.  Hypertension:  Blood pressures are normal without treatment.  Was recently discontinued all antihypertensives and Terazosin.    Acute kidney injury on chronic kidney disease stage IIIa: Baseline creatinine unknown about 1.4.  Due to #1.    Treated with gentle IV fluids.  Creatinine trending down from 2.1 -1.7 today.  Adequate urine output.  Will need close follow-up outpatient to ensure stabilization.  COVID-19 viral infection: Patient currently with no pulmonary symptoms.  Monitor.  No indication to restart COVID-19 directed therapies at this time.  Isolation precautions and quarantine time discussed and educated to the patient and patient's wife.  Patient's wife also getting tested today.  Electrolytes replaced and normalized.  Chronic diarrhea: Extensively investigated as outpatient. Reportedly negative stool studies.  C. difficile negative in the hospital.  Patient is medically stable.  He has ambulated with this provider, no orthostatic changes no dizziness.  Has some dry cough which is mostly present for many days now.  Called and discussed with family, patient's wife and educated.  Patient is stable for discharge.   Discharge Diagnoses:  Principal Problem:   Syncope Active Problems:   AF (paroxysmal atrial fibrillation) (HCC)   HTN (hypertension)   Chronic diastolic CHF (congestive heart failure) (HCC)/EF 60 to 65 %   COVID-19 virus infection   AKI (acute kidney injury) ([place])   Hypokalemia   Syncope and collapse    Discharge Instructions  Discharge Instructions    Diet - low sodium heart healthy   Complete by: As directed    Increase activity slowly   Complete by: As directed    MyChart COVID-19 home monitoring program   Complete by: Oct 25, 2019    Is the patient willing to use the [employer]   for home monitoring?: Yes   Temperature monitoring   Complete by: Oct 25, 2019    After how many days would you like to receive a notification of this patient's flowsheet entries?: 1      Allergies as of 10/25/2019      Reactions   Terazosin Hcl    Dizziness and hypotension      Medication List    TAKE these medications   acetaminophen 325 MG tablet Commonly known as: TYLENOL Take 2 tablets (650 mg total) by mouth every 6 (six) hours as needed for mild pain (or Fever >/= 101).   ALPRAZolam 0.5 MG tablet Commonly known as: XANAX Take 0.5 mg by mouth 2 (two) times daily as needed for anxiety.   apixaban 5 MG Tabs tablet Commonly known as: ELIQUIS Take 1 tablet (5 mg total) by mouth 2 (two) times daily. Blood thinner for stroke prevention   buPROPion 300 MG 24 hr tablet Commonly known as: WELLBUTRIN XL Take 300 mg by mouth daily.   Combigan 0.2-0.5 % ophthalmic solution Generic drug: brimonidine-timolol Place 1 drop into the left eye daily.   escitalopram 10 MG tablet Commonly known as: LEXAPRO Take 10 mg by mouth daily.   famotidine 40 MG tablet Commonly known as: PEPCID Take 40 mg by mouth 2 (two) times daily.   FISH OIL PO Take 1 capsule by mouth daily.   latanoprost 0.005 % ophthalmic solution Commonly known as: XALATAN Place 1 drop into the left eye at bedtime.   pantoprazole 40 MG tablet Commonly known as: PROTONIX Take 40 mg by mouth 2 (two) times daily.   pravastatin 20 MG tablet Commonly known as: PRAVACHOL Take 20 mg by mouth daily.   vitamin C 500 MG tablet Commonly known as: ASCORBIC ACID Take 1 tablet (500 mg total) by mouth daily.   Vitamin D3 75 MCG (3000 UT) Tabs Take 1 tablet by mouth daily.       Allergies  Allergen Reactions  . Terazosin Hcl     Dizziness and hypotension    Consultations:  None   Procedures/Studies: Ct Head Wo Contrast  Result Date: 10/23/2019 CLINICAL DATA:  Head trauma secondary to syncope. Abrasion to the forehead and scalp hematoma posteriorly. COVID-19. The patient reports being on anticoagulation therapy. EXAM: CT HEAD WITHOUT CONTRAST TECHNIQUE: Contiguous axial images were  obtained from the base of the skull through the vertex without intravenous contrast. COMPARISON:  None. FINDINGS: Brain: No evidence of acute infarction, hemorrhage, hydrocephalus, extra-axial collection or mass lesion/mass effect. Diffuse mild atrophy. Vascular: No hyperdense vessel or unexpected calcification. Skull: Normal. Negative for fracture or focal lesion. Sinuses/Orbits: Extensive partial opacification of the ethmoid and sphenoid sinuses. Orbits are normal. Other: Small scalp hematoma over the left side of the frontal bone. More prominent left posterior parietal scalp hematoma. IMPRESSION: 1. No acute intracranial abnormality. 2. Scalp hematomas. 3. 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 Electronically Signed   By: Lorriane Shire M.D.   On: 10/23/2019 14:30   Dg Chest Portable 1 View  Result Date: 10/23/2019 CLINICAL DATA:  Status post fall last night. EXAM: PORTABLE CHEST 1 VIEW COMPARISON:  Single-view of the chest 06/02/2019. PA and lateral chest 09/24/2012. FINDINGS: Lungs are clear. Heart size is upper normal. No pneumothorax or pleural fluid. No acute or focal bony abnormality. IMPRESSION: No acute disease. Electronically Signed   By: Inge Rise M.D.   On: 10/23/2019 16:11     Subjective: Patient seen and examined.  Ambulatory in  the room, no orthostatic changes.  Some dry cough but denies any other complaints.  Afebrile.  Eager to go home.   Discharge Exam: Vitals:   10/25/19 0700 10/25/19 0800  BP: 120/70   Pulse:    Resp: 18 17  Temp: 98.5 F (36.9 C)   SpO2: 95%    Vitals:   10/24/19 2259 10/25/19 0534 10/25/19 0700 10/25/19 0800  BP: (!) 162/72  120/70   Pulse: 61     Resp: 16  18 17   Temp: 98.4 F (36.9 C)  98.5 F (36.9 C)   TempSrc: Oral  Oral   SpO2: 93%  95%   Weight:  80 kg    Height:        General: Pt is alert, awake, not in acute distress Cardiovascular: RRR, S1/S2 +, no rubs, no gallops Respiratory: CTA bilaterally, no wheezing, no  rhonchi Abdominal: Soft, NT, ND, bowel sounds + Extremities: no edema, no cyanosis    The results of significant diagnostics from this hospitalization (including imaging, microbiology, ancillary and laboratory) are listed below for reference.     Microbiology: Recent Results (from the past 240 hour(s))  C difficile quick scan w PCR reflex     Status: None   Collection Time: 10/24/19  4:20 PM   Specimen: STOOL  Result Value Ref Range Status   C Diff antigen NEGATIVE NEGATIVE Final   C Diff toxin NEGATIVE NEGATIVE Final   C Diff interpretation No C. difficile detected.  Final    Comment: Performed at [facility], [place] [address] [place], [place] [address]     Labs: BNP (last 3 results) No results for input(s): BNP in the last 8760 hours. Basic Metabolic Panel: Recent Labs  Lab 10/23/19 1348 10/23/19 1742 10/24/19 0837 10/25/19 0536  NA 134*  --  135 137  K 3.2*  --  3.2* 3.8  CL 95*  --  99 101  CO2 27  --  26 26  GLUCOSE 107*  --  87 90  BUN 25*  --  24* 25*  CREATININE 2.07*  --  2.02* 1.77*  CALCIUM 8.5*  --  8.4* 8.6*  MG  --  2.1 1.9  --   PHOS  --   --  3.5  --    Liver Function Tests: Recent Labs  Lab 10/24/19 0837  AST 26  ALT 22  ALKPHOS 52  BILITOT 0.6  PROT 6.4*  ALBUMIN 2.6*   No results for input(s): LIPASE, AMYLASE in the last 168 hours. No results for input(s): AMMONIA in the last 168 hours. CBC: Recent Labs  Lab 10/23/19 1348 10/24/19 0837 10/25/19 0536  WBC 3.4* 3.6* 3.7*  NEUTROABS 2.2 2.1 2.3  HGB 12.6* 12.1* 12.7*  HCT 38.4* 36.1* 36.9*  MCV 89.1 86.8 86.4  PLT 176 165 174   Cardiac Enzymes: No results for input(s): CKTOTAL, CKMB, CKMBINDEX, TROPONINI in the last 168 hours. BNP: Invalid input(s): POCBNP CBG: No results for input(s): GLUCAP in the last 168 hours. D-Dimer Recent Labs    10/23/19 1742  DDIMER 1.59*   Hgb A1c No results for input(s): HGBA1C in the last 72 hours. Lipid Profile No results for  input(s): CHOL, HDL, LDLCALC, TRIG, CHOLHDL, LDLDIRECT in the last 72 hours. Thyroid function studies No results for input(s): TSH, T4TOTAL, T3FREE, THYROIDAB in the last 72 hours.  Invalid input(s): FREET3 Anemia work up Recent Labs    10/23/19 1742  FERRITIN 472*   Urinalysis  Component Value Date/Time   COLORURINE YELLOW 10/23/2019 1956   APPEARANCEUR CLEAR 10/23/2019 1956   LABSPEC 1.013 10/23/2019 1956   PHURINE 6.0 10/23/2019 1956   GLUCOSEU NEGATIVE 10/23/2019 1956   HGBUR MODERATE (A) 10/23/2019 [place] NEGATIVE 10/23/2019 [place] NEGATIVE 10/23/2019 1956   PROTEINUR 30 (A) 10/23/2019 1956   NITRITE NEGATIVE 10/23/2019 1956   LEUKOCYTESUR NEGATIVE 10/23/2019 1956   Sepsis Labs Invalid input(s): PROCALCITONIN,  WBC,  LACTICIDVEN Microbiology Recent Results (from the past 240 hour(s))  C difficile quick scan w PCR reflex     Status: None   Collection Time: 10/24/19  4:20 PM   Specimen: STOOL  Result Value Ref Range Status   C Diff antigen NEGATIVE NEGATIVE Final   C Diff toxin NEGATIVE NEGATIVE Final   C Diff interpretation No C. difficile detected.  Final    Comment: Performed at [facility], [place] [address], [place], [place] [address]     Time coordinating discharge:  40 minutes  SIGNED:   Barb Merino, MD  [facility] 10/25/2019, 10:13 AM

## 2019-10-25 NOTE — Progress Notes (Signed)
Family called to inform of pt d/c from unit. N/a at this time.

## 2019-10-25 NOTE — Discharge Instructions (Signed)

## 2019-12-26 ENCOUNTER — Ambulatory Visit: Payer: Medicare PPO | Attending: Internal Medicine

## 2019-12-26 DIAGNOSIS — Z23 Encounter for immunization: Secondary | ICD-10-CM

## 2019-12-26 NOTE — Progress Notes (Signed)
   Covid-19 Vaccination Clinic  Name:  Jorge West    MRN: [id number] DOB: 1946-02-15  12/26/2019  Jorge West was observed post Covid-19 immunization for 15 minutes without incidence. He was provided with Vaccine Information Sheet and instruction to access the V-Safe system.   Jorge West was instructed to call 911 with any severe reactions post vaccine: Marland Kitchen Difficulty breathing  . Swelling of your face and throat  . A fast heartbeat  . A bad rash all over your body  . Dizziness and weakness    Immunizations Administered    Name Date Dose VIS Date Route   Moderna COVID-19 Vaccine 12/26/2019  1:25 PM 0.5 mL 10/22/2019 Intramuscular   Manufacturer: Moderna   Lot: [id number]   [id number]

## 2020-01-27 ENCOUNTER — Ambulatory Visit: Payer: Medicare PPO | Attending: Internal Medicine

## 2020-01-27 DIAGNOSIS — Z23 Encounter for immunization: Secondary | ICD-10-CM | POA: Insufficient documentation

## 2020-01-27 NOTE — Progress Notes (Signed)
   Covid-19 Vaccination Clinic  Name:  Jorge West    MRN: [id number] DOB: 03/24/1946  01/27/2020  Jorge West was observed post Covid-19 immunization for 15 minutes without incident. He was provided with Vaccine Information Sheet and instruction to access the V-Safe system.   Jorge West was instructed to call 911 with any severe reactions post vaccine: Marland Kitchen Difficulty breathing  . Swelling of face and throat  . A fast heartbeat  . A bad rash all over body  . Dizziness and weakness   Immunizations Administered    Name Date Dose VIS Date Route   Moderna COVID-19 Vaccine 01/27/2020 12:05 PM 0.5 mL 10/22/2019 Intramuscular   Manufacturer: Moderna   Lot: [id number]   [id number]

## 2020-03-30 ENCOUNTER — Ambulatory Visit: Payer: Medicare PPO | Admitting: Dermatology

## 2020-03-30 ENCOUNTER — Other Ambulatory Visit: Payer: Self-pay

## 2020-03-30 ENCOUNTER — Encounter: Payer: Self-pay | Admitting: Dermatology

## 2020-03-30 DIAGNOSIS — Z85828 Personal history of other malignant neoplasm of skin: Secondary | ICD-10-CM | POA: Diagnosis not present

## 2020-03-30 DIAGNOSIS — D229 Melanocytic nevi, unspecified: Secondary | ICD-10-CM

## 2020-03-30 DIAGNOSIS — L570 Actinic keratosis: Secondary | ICD-10-CM

## 2020-03-30 DIAGNOSIS — D225 Melanocytic nevi of trunk: Secondary | ICD-10-CM | POA: Diagnosis not present

## 2020-03-30 NOTE — Patient Instructions (Signed)
Routine recheck for Manvik Kozloff [facility] who has a history of multiple skin cancers mostly on the head and the left arm.  Waist up skin examination today shows no recurrent skin cancer no atypical moles.  There are a half dozen crusted spots on the face and the right ear which fit precancers which were treated with 5-second liquid nitrogen freeze.  Of most concern to me is a larger crust on the right antihelix in the middle of the ear; I told Denny Peon that if this spot is still crusty in 4 to 6weeks, he will return and I will obtain a biopsy.  Otherwise routine follow-up in 1 year

## 2020-04-05 ENCOUNTER — Encounter: Payer: Self-pay | Admitting: Dermatology

## 2020-04-05 NOTE — Progress Notes (Signed)
   Follow-Up Visit   Subjective  Jorge West is a 74 y.o. male who presents for the following: Skin Problem (Here this morning because he has a couple of new places across his face that he is concerned about. Been there for about 3-4 months. Only flaking, no itching or bleeding.).  Crusts Location: Face and right ear Duration: Months Quality: Crease number Associated Signs/Symptoms: Modifying Factors: Previous freezing helped Severity:  Timing: Context: She had multiple nonmelanoma skin cancers  The following portions of the chart were reviewed this encounter and updated as appropriate: Tobacco  Allergies  Meds  Problems  Med Hx  Surg Hx  Fam Hx      Objective  Well appearing patient in no apparent distress; mood and affect are within normal limits.  All skin waist up examined.   Assessment & Plan  AK (actinic keratosis) (5) Right Ear; Left Zygomatic Area (3); Left Malar Cheek  Destruction of lesion - Left Malar Cheek, Left Zygomatic Area, Right Ear  Destruction method: cryotherapy   Informed consent: discussed and consent obtained   Timeout:  patient name, date of birth, surgical site, and procedure verified Lesion destroyed using liquid nitrogen: Yes   Region frozen until ice ball extended beyond lesion: Yes   Cryotherapy cycles:  5 Outcome: patient tolerated procedure well with no complications    Nevus Mid Back  Personal history of skin cancer Left Forehead Routine recheck for Jorge West who has a history of multiple skin cancers mostly on the head and the left arm.  Waist up skin examination today shows no recurrent skin cancer no atypical moles.  There are a half dozen crusted spots on the face and the right ear which fit precancers which were treated with 5-second liquid nitrogen freeze.  Of most concern to me is a larger crust on the right antihelix in the middle of the ear; I told Jorge West that if this spot is still crusty in 4 to 6weeks, he will  return and I will obtain a biopsy.  Otherwise routine follow-up in 1 year

## 2020-05-06 ENCOUNTER — Ambulatory Visit: Payer: Medicare PPO | Admitting: Dermatology

## 2020-09-23 DIAGNOSIS — N1832 Chronic kidney disease, stage 3b: Secondary | ICD-10-CM | POA: Insufficient documentation

## 2020-09-23 DIAGNOSIS — Z7901 Long term (current) use of anticoagulants: Secondary | ICD-10-CM | POA: Insufficient documentation

## 2020-11-11 ENCOUNTER — Encounter (HOSPITAL_COMMUNITY): Payer: Self-pay

## 2020-11-11 ENCOUNTER — Emergency Department (HOSPITAL_COMMUNITY)
Admission: EM | Admit: 2020-11-11 | Discharge: 2020-11-11 | Disposition: A | Discharge status: Home / Self Care | Payer: Medicare PPO | Attending: Emergency Medicine | Admitting: Emergency Medicine

## 2020-11-11 ENCOUNTER — Emergency Department (HOSPITAL_COMMUNITY): Payer: Medicare PPO

## 2020-11-11 ENCOUNTER — Other Ambulatory Visit: Payer: Self-pay

## 2020-11-11 DIAGNOSIS — Z23 Encounter for immunization: Secondary | ICD-10-CM | POA: Diagnosis not present

## 2020-11-11 DIAGNOSIS — I5032 Chronic diastolic (congestive) heart failure: Secondary | ICD-10-CM | POA: Diagnosis not present

## 2020-11-11 DIAGNOSIS — M47812 Spondylosis without myelopathy or radiculopathy, cervical region: Secondary | ICD-10-CM | POA: Diagnosis not present

## 2020-11-11 DIAGNOSIS — S0990XA Unspecified injury of head, initial encounter: Secondary | ICD-10-CM | POA: Insufficient documentation

## 2020-11-11 DIAGNOSIS — F17200 Nicotine dependence, unspecified, uncomplicated: Secondary | ICD-10-CM | POA: Diagnosis not present

## 2020-11-11 DIAGNOSIS — I480 Paroxysmal atrial fibrillation: Secondary | ICD-10-CM | POA: Diagnosis not present

## 2020-11-11 DIAGNOSIS — Z79899 Other long term (current) drug therapy: Secondary | ICD-10-CM | POA: Insufficient documentation

## 2020-11-11 DIAGNOSIS — W1789XA Other fall from one level to another, initial encounter: Secondary | ICD-10-CM | POA: Insufficient documentation

## 2020-11-11 DIAGNOSIS — Z7901 Long term (current) use of anticoagulants: Secondary | ICD-10-CM | POA: Diagnosis not present

## 2020-11-11 DIAGNOSIS — I110 Hypertensive heart disease with heart failure: Secondary | ICD-10-CM | POA: Insufficient documentation

## 2020-11-11 DIAGNOSIS — Z85828 Personal history of other malignant neoplasm of skin: Secondary | ICD-10-CM | POA: Diagnosis not present

## 2020-11-11 DIAGNOSIS — Z8616 Personal history of COVID-19: Secondary | ICD-10-CM | POA: Diagnosis not present

## 2020-11-11 DIAGNOSIS — S0101XA Laceration without foreign body of scalp, initial encounter: Secondary | ICD-10-CM | POA: Insufficient documentation

## 2020-11-11 DIAGNOSIS — Y30XXXA Falling, jumping or pushed from a high place, undetermined intent, initial encounter: Secondary | ICD-10-CM | POA: Insufficient documentation

## 2020-11-11 MED ORDER — LIDOCAINE-EPINEPHRINE (PF) 2 %-1:200000 IJ SOLN
INTRAMUSCULAR | Status: AC
  Administered 2020-11-11: 20 mL
  Filled 2020-11-11: qty 20

## 2020-11-11 MED ORDER — LIDOCAINE-EPINEPHRINE (PF) 2 %-1:200000 IJ SOLN
20.0000 mL | Freq: Once | INTRAMUSCULAR | Status: AC
  Filled 2020-11-11: qty 20

## 2020-11-11 MED ORDER — TETANUS-DIPHTH-ACELL PERTUSSIS 5-2.5-18.5 LF-MCG/0.5 IM SUSY
0.5000 mL | PREFILLED_SYRINGE | Freq: Once | INTRAMUSCULAR | Status: AC
  Administered 2020-11-11: 0.5 mL via INTRAMUSCULAR
  Filled 2020-11-11: qty 0.5

## 2020-11-11 MED ORDER — POVIDONE-IODINE 10 % EX SOLN
CUTANEOUS | Status: AC
  Filled 2020-11-11: qty 15

## 2020-11-11 NOTE — ED Provider Notes (Signed)
[facility] EMERGENCY DEPARTMENT Provider Note   CSN: [id number] Arrival date & time: 11/11/20  1546     History Chief Complaint  Patient presents with  . Head Laceration    Jorge West is a 74 y.o. male.  HPI He presents by private vehicle for evaluation of injury from fall.  He was standing on a hay bale, on the bed of a truck, when he fell off striking the truck then the ground.  He states he did not lose consciousness, laid there for little bit of time then was able to get up and walk.  He denies headache, blurred vision, nausea, vomiting, neck pain, back pain, weakness or dizziness.  He takes Eliquis for atrial fibrillation.  There are no other known modifying factors.    Past Medical History:  Diagnosis Date  . Atrial fibrillation [facility])    Had an ablation done  . Atypical mole 06/14/2000   moderate/marked on mid back [facility])  . Atypical mole 11/29/2001   slight/moderate on right shoulder (widershave)  . Atypical mole 11/29/2001   moderate on right lateral abdomen (widershave)  . Atypical mole 11/29/2001   moderate on right forearm (widershave)  . Atypical mole 01/11/2007   moderate on lower mid back  . Atypical mole 07/14/2008   SK and atypical solar lentigo on left jawline (widershave)  . Basal cell carcinoma 07/29/1996   back left lower ear - CX3+excision  . Basal cell carcinoma 09/22/2004   basosquamous on right tip of nose (MOHs)  . Basal cell carcinoma 10/20/2005   superficial on upper center back - CX3+5FU  . Basal cell carcinoma 10/20/2011   right upper back - tx p bx  . Basal cell carcinoma 02/07/2018   superficial/nodular on left upper arm - CX3+cautery+5FU  . Basal cell carcinoma 10/23/2018   infiltrative on left sideburn [facility])  . Benign prostate hyperplasia   . Cataract    left and removed  . Depression   . Depression   . Diverticulosis   . Erectile dysfunction   . Hyperlipidemia   . Hypertension   . Impaired fasting glucose    . Nocturia   . Personal history of colonic polyps 04/19/2005  . 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   left post auricular - clear at visit on 12/28/2001  . Squamous cell carcinoma of skin 10/08/2003   well differentiated below outer left eye - MOHs  . Squamous cell carcinoma of skin 06/02/2008   well differentiated behind left ear  . Squamous cell carcinoma of skin 10/20/2011   KA on left elbow  . Squamous cell carcinoma of skin 03/02/2015   well differentiated on right outer cheek - CX3+excision  . Squamous cell carcinoma of skin 03/01/2017   well differentiated on left forearm - tx p bx  . Squamous cell carcinoma of skin 03/07/2018   moderately differentiated on left jawline - CX3+excision    Patient Active Problem List   Diagnosis Date Noted  . COVID-19 virus infection 10/24/2019  . AKI (acute kidney injury) ([place]) 10/24/2019  . Hypokalemia 10/24/2019  . Syncope and collapse 10/24/2019  . Syncope 10/23/2019  . AF (paroxysmal atrial fibrillation) ([place]) 06/02/2019  . HTN (hypertension) 06/02/2019  . HLD (hyperlipidemia) 06/02/2019  . Chest pain in adult 06/02/2019  . Chronic diastolic CHF (congestive heart failure) (HCC)/EF 60 to 65 % 06/02/2019  . Metacarpal bone fracture 03/15/2012  . CLOSED FRACTURE OF LATERAL MALLEOLUS 01/03/2011  . DERANGEMENT MENISCUS 02/02/2009  . KNEE PAIN 02/02/2009  .  Personal history of colonic polyps 04/19/2005    Past Surgical History:  Procedure Laterality Date  . CATARACT EXTRACTION    . COLONOSCOPY    . OPEN REDUCTION INTERNAL FIXATION (ORIF) METACARPAL  09/24/2012   Procedure: OPEN REDUCTION INTERNAL FIXATION (ORIF) METACARPAL;  Surgeon: Linna Hoff, MD;  Location: [place];  Service: Orthopedics;  Laterality: Left;  and proximal phalanges.  . surgery for skin cancer         Family History  Problem Relation Age of Onset  . Cancer Other        family history   . Colon cancer Neg Hx   . Colon polyps Neg Hx   . Rectal cancer  Neg Hx   . Stomach cancer Neg Hx     Social History   Tobacco Use  . Smoking status: Never Smoker  . Smokeless tobacco: Former Systems developer  . Tobacco comment: 10-15 yeras quit   Vaping Use  . Vaping Use: Never used  Substance Use Topics  . Alcohol use: Not Currently    Alcohol/week: 0.0 standard drinks  . Drug use: Never    Home Medications Prior to Admission medications   Medication Sig Start Date End Date Taking? Authorizing Provider  acetaminophen (TYLENOL) 325 MG tablet Take 2 tablets (650 mg total) by mouth every 6 (six) hours as needed for mild pain (or Fever >/= 101). 06/03/19  Yes Emokpae, Courage, MD  ALPRAZolam (XANAX) 0.5 MG tablet Take 0.5 mg by mouth 2 (two) times daily as needed for anxiety.   Yes [provider]  amLODipine (NORVASC) 5 MG tablet Take 10 mg by mouth daily. 03/03/20  Yes [provider]  apixaban (ELIQUIS) 5 MG TABS tablet Take 1 tablet (5 mg total) by mouth 2 (two) times daily. Blood thinner for stroke prevention 06/03/19  Yes Emokpae, Courage, MD  brimonidine-timolol (COMBIGAN) 0.2-0.5 % ophthalmic solution Place 1 drop into the left eye daily. 02/29/16  Yes [provider]  buPROPion (WELLBUTRIN XL) 300 MG 24 hr tablet Take 300 mg by mouth daily.   Yes [provider]  Cholecalciferol (VITAMIN D3) 3000 UNITS TABS Take 1 tablet by mouth daily.    Yes [provider]  escitalopram (LEXAPRO) 10 MG tablet Take 10 mg by mouth daily.   Yes [provider]  Omega-3 Fatty Acids (FISH OIL PO) Take 1 capsule by mouth daily.    Yes [provider]  pantoprazole (PROTONIX) 40 MG tablet Take 40 mg by mouth 2 (two) times daily.   Yes [provider]  pravastatin (PRAVACHOL) 20 MG tablet Take 20 mg by mouth daily.    Yes [provider]  vitamin C (ASCORBIC ACID) 500 MG tablet Take 1 tablet (500 mg total) by mouth daily. Patient not taking: Reported on 11/11/2020 09/25/12   Iran Planas, MD     Allergies    Terazosin hcl  Review of Systems   Review of Systems  All other systems reviewed and are negative.   Physical Exam Updated Vital Signs BP (!) 161/78   Pulse 60   Temp 98.3 F (36.8 C) (Oral)   Resp 18   Ht 5\' 10"  (1.778 m)   Wt 85.3 kg   SpO2 93%   BMI 26.98 kg/m   Physical Exam Vitals and nursing note reviewed.  Constitutional:      General: He is not in acute distress.    Appearance: He is well-developed and well-nourished. He is not ill-appearing, toxic-appearing or diaphoretic.  HENT:     Head: Normocephalic.     Comments: Large linear gaping laceration, right frontal to right parietal region.  No associated crepitation deformity or significant bleeding.    Right Ear: External ear normal.     Left Ear: External ear normal.  Eyes:     Extraocular Movements: EOM normal.     Conjunctiva/sclera: Conjunctivae normal.     Pupils: Pupils are equal, round, and reactive to light.  Neck:     Trachea: Phonation normal.  Cardiovascular:     Rate and Rhythm: Normal rate.  Pulmonary:     Effort: Pulmonary effort is normal.  Chest:     Chest wall: No bony tenderness.  Abdominal:     General: There is no distension.     Palpations: Abdomen is soft.  Musculoskeletal:        General: Normal range of motion.     Cervical back: Normal range of motion and neck supple.  Skin:    General: Skin is warm, dry and intact.  Neurological:     Mental Status: He is alert and oriented to person, place, and time.     Cranial Nerves: No cranial nerve deficit.     Sensory: No sensory deficit.     Motor: No abnormal muscle tone.     Coordination: Coordination normal.  Psychiatric:        Mood and Affect: Mood and affect and mood normal.        Behavior: Behavior normal.        Thought Content: Thought content normal.        Judgment: Judgment normal.     ED Results / Procedures / Treatments   Labs (all labs ordered are listed, but only abnormal results are  displayed) Labs Reviewed - No data to display  EKG None  Radiology CT Head Wo Contrast  Result Date: 11/11/2020 CLINICAL DATA:  History of fall, laceration to scalp in this patient on blood thinners EXAM: CT HEAD WITHOUT CONTRAST CT CERVICAL SPINE WITHOUT CONTRAST TECHNIQUE: Multidetector CT imaging of the head and cervical spine was performed following the standard protocol without intravenous contrast. Multiplanar CT image reconstructions of the cervical spine were also generated. COMPARISON:  October 23, 2019 CT of the head FINDINGS: CT HEAD FINDINGS Brain: No evidence of acute infarction, hemorrhage, hydrocephalus, extra-axial collection or mass lesion/mass effect. Signs of atrophy as before. Vascular: No hyperdense vessel or unexpected calcification. Skull: Normal. Negative for fracture or focal lesion. Sinuses/Orbits: Signs of chronic sinusitis in the sphenoid sinus is perhaps slightly improved compared to the previous imaging study with only partial opacification of the sphenoid sinuses. No acute orbital finding. Maxillary sinuses are clear with respect imaged portions, incompletely imaged on today's exam. Other: Subtle scalp laceration without significant subcutaneous hematoma underlying skull fracture. CT CERVICAL SPINE FINDINGS Alignment: Straightening of normal cervical lordotic curvature may be due to patient position or spasm. Skull base and vertebrae: No acute fracture. No primary bone lesion or focal pathologic process. Soft tissues and spinal canal: No prevertebral fluid or swelling. No visible canal hematoma. Disc levels: Multilevel degenerative changes throughout the cervical spine disc space narrowing disc space narrowing greatest at C4-5 and C5-6 as well as C6-7 with mild anterior osteophyte formation and uncovertebral spurring with signs of neural foraminal narrowing greatest on the RIGHT at C5-6 and C6-7. Facet arthropathy with fusion of facets on the RIGHT at C4-5, bony fusion in the  setting of degenerative change. Mild degenerative anterolisthesis of C3  on C4, approximately 2 mm. Upper chest: Negative. Other: None IMPRESSION: 1. No acute intracranial abnormality. 2. Signs of scalp laceration along the RIGHT paramidline scalp. 3. No evidence of acute fracture or subluxation of the cervical spine. 4. Multilevel degenerative changes of the cervical spine as described. 5. Signs of chronic sphenoid sinusitis, perhaps slightly improved compared to the previous imaging study. Electronically Signed   By: Zetta Bills M.D.   On: 11/11/2020 17:29   CT Cervical Spine Wo Contrast  Result Date: 11/11/2020 CLINICAL DATA:  History of fall, laceration to scalp in this patient on blood thinners EXAM: CT HEAD WITHOUT CONTRAST CT CERVICAL SPINE WITHOUT CONTRAST TECHNIQUE: Multidetector CT imaging of the head and cervical spine was performed following the standard protocol without intravenous contrast. Multiplanar CT image reconstructions of the cervical spine were also generated. COMPARISON:  October 23, 2019 CT of the head FINDINGS: CT HEAD FINDINGS Brain: No evidence of acute infarction, hemorrhage, hydrocephalus, extra-axial collection or mass lesion/mass effect. Signs of atrophy as before. Vascular: No hyperdense vessel or unexpected calcification. Skull: Normal. Negative for fracture or focal lesion. Sinuses/Orbits: Signs of chronic sinusitis in the sphenoid sinus is perhaps slightly improved compared to the previous imaging study with only partial opacification of the sphenoid sinuses. No acute orbital finding. Maxillary sinuses are clear with respect imaged portions, incompletely imaged on today's exam. Other: Subtle scalp laceration without significant subcutaneous hematoma underlying skull fracture. CT CERVICAL SPINE FINDINGS Alignment: Straightening of normal cervical lordotic curvature may be due to patient position or spasm. Skull base and vertebrae: No acute fracture. No primary bone lesion or  focal pathologic process. Soft tissues and spinal canal: No prevertebral fluid or swelling. No visible canal hematoma. Disc levels: Multilevel degenerative changes throughout the cervical spine disc space narrowing disc space narrowing greatest at C4-5 and C5-6 as well as C6-7 with mild anterior osteophyte formation and uncovertebral spurring with signs of neural foraminal narrowing greatest on the RIGHT at C5-6 and C6-7. Facet arthropathy with fusion of facets on the RIGHT at C4-5, bony fusion in the setting of degenerative change. Mild degenerative anterolisthesis of C3 on C4, approximately 2 mm. Upper chest: Negative. Other: None IMPRESSION: 1. No acute intracranial abnormality. 2. Signs of scalp laceration along the RIGHT paramidline scalp. 3. No evidence of acute fracture or subluxation of the cervical spine. 4. Multilevel degenerative changes of the cervical spine as described. 5. Signs of chronic sphenoid sinusitis, perhaps slightly improved compared to the previous imaging study. Electronically Signed   By: Zetta Bills M.D.   On: 11/11/2020 17:29    Procedures .Marland KitchenLaceration Repair  Date/Time: 11/11/2020 6:36 PM Performed by: Daleen Bo, MD Authorized by: Daleen Bo, MD   Consent:    Consent obtained:  Verbal   Consent given by:  Patient   Risks, benefits, and alternatives were discussed: yes     Risks discussed:  Infection, pain and poor wound healing   Alternatives discussed:  No treatment Universal protocol:    Procedure explained and questions answered to patient or proxy's satisfaction: yes     Immediately prior to procedure, a time out was called: yes     Patient identity confirmed:  Verbally with patient Anesthesia:    Anesthesia method:  Local infiltration   Local anesthetic:  Lidocaine 2% WITH epi Laceration details:    Location:  Scalp   Scalp location:  R parietal   Length (cm):  9   Depth (mm):  9 Pre-procedure details:    Preparation:  Patient was prepped  and draped in usual sterile fashion and imaging obtained to evaluate for foreign bodies Exploration:    Limited defect created (wound extended): no     Hemostasis achieved with:  Direct pressure   Wound exploration: wound explored through full range of motion and entire depth of wound visualized     Wound extent: no areolar tissue violation noted, no fascia violation noted, no foreign bodies/material noted, no muscle damage noted, no nerve damage noted, no underlying fracture noted and no vascular damage noted     Contaminated: no   Treatment:    Area cleansed with:  Povidone-iodine   Amount of cleaning:  Standard   Irrigation solution:  Sterile saline   Irrigation volume:  60 cc   Irrigation method:  Syringe   Visualized foreign bodies/material removed: no     Debridement:  None   Undermining:  None Skin repair:    Repair method:  Staples Approximation:    Approximation:  Loose Repair type:    Repair type:  Simple Post-procedure details:    Dressing:  Antibiotic ointment and sterile dressing   Procedure completion:  Tolerated well, no immediate complications   (including critical care time)  Medications Ordered in ED Medications  Tdap (BOOSTRIX) injection 0.5 mL (0.5 mLs Intramuscular Given 11/11/20 1627)  lidocaine-EPINEPHrine (XYLOCAINE W/EPI) 2 %-1:200000 (PF) injection 20 mL (20 mLs Infiltration Given by Other 11/11/20 1822)  povidone-iodine (BETADINE) 10 % external solution (  Given by Other 11/11/20 1831)    ED Course  I have reviewed the triage vital signs and the nursing notes.  Pertinent labs & imaging results that were available during my care of the patient were reviewed by me and considered in my medical decision making (see chart for details).    MDM Rules/Calculators/A&P                           Patient Vitals for the past 24 hrs:  BP Temp Temp src Pulse Resp SpO2 Height Weight  11/11/20 1750 (!) 161/78 -- -- 60 18 93 % -- --  11/11/20 1606 (!) 163/94  98.3 F (36.8 C) Oral 67 20 92 % 5\' 10"  (1.778 m) 85.3 kg    At time of discharge- reevaluation with update and discussion. After initial assessment and treatment, an updated evaluation reveals he is comfortable has no further complaints.  Findings discussed and questions answered. Daleen Bo   Medical Decision Making:  This patient is presenting for evaluation of injury from falling, which does require a range of treatment options, and is a complaint that involves a high risk of morbidity and mortality. The differential diagnoses include head injury, neck injury, soft tissue injury, visceral injury. I decided to review old records, and in summary elderly male, with mechanical fall, fell while standing on an uneven surface.  I did not require additional historical information from anyone.   Radiologic Tests Ordered, included CT head and CT cervical spine.  I independently Visualized: CT images, which show no acute injuries    Critical Interventions-clinical evaluation, CT imaging, observation, laceration repair, discussion of wound care  After These Interventions, the Patient was reevaluated and was found stable for discharge.  No acute intracranial injury.  Incidental cervical spine spondylosis.  No indication for hospitalization or further ED intervention  CRITICAL CARE-no Performed by: Daleen Bo  Nursing Notes Reviewed/ Care Coordinated Applicable Imaging Reviewed Interpretation of Laboratory Data incorporated into ED treatment  The  patient appears reasonably screened and/or stabilized for discharge and I doubt any other medical condition or other [facility] requiring further screening, evaluation, or treatment in the ED at this time prior to discharge.  Plan: Home Medications-continue usual, Tylenol for pain; Home Treatments-wound care at home; return here if the recommended treatment, does not improve the symptoms; Recommended follow up-staple removal 7 days and PCP, as  needed     Final Clinical Impression(s) / ED Diagnoses Final diagnoses:  Laceration of scalp, initial encounter  Injury of head, initial encounter  Spondylosis of cervical region without myelopathy or radiculopathy    Rx / DC Orders ED Discharge Orders    None       Daleen Bo, MD 11/20/20 1827

## 2020-11-11 NOTE — ED Triage Notes (Addendum)
Pt to er, pt states that he fell off a hay bail and landed on a dump truck, pt has aprox 6 inch lac to the top of his head, pt states that he is on blood thinners, pt states that he has no pain, c collar placed. Pt states that he fell aprox 8 feet

## 2020-11-11 NOTE — Discharge Instructions (Signed)
See your doctor for staple removal in 10 days.  Take Tylenol for pain.  You can use ice on the sore area 3-4 times a day to help pain and swelling.  It is okay to clean the scalp wound daily with soap and water.  Return here if needed for problems.  Since you are on blood thinner there is increased risk of bleeding.  A CAT scan today did not show any bleeding of the area around the brain.  CT scan of the neck did show some arthritis but nothing broken.

## 2020-11-11 NOTE — ED Notes (Signed)
Patient discharged to home.  All discharge instructions reviewed.  Patient able to verbalize understanding via teachback method.  Ambulatory out of ED.  

## 2021-01-31 ENCOUNTER — Other Ambulatory Visit: Payer: Self-pay

## 2021-01-31 ENCOUNTER — Encounter (HOSPITAL_COMMUNITY): Payer: Self-pay | Admitting: Emergency Medicine

## 2021-01-31 ENCOUNTER — Emergency Department (HOSPITAL_COMMUNITY)
Admission: EM | Admit: 2021-01-31 | Discharge: 2021-01-31 | Disposition: A | Discharge status: Home / Self Care | Payer: Medicare PPO | Attending: Emergency Medicine | Admitting: Emergency Medicine

## 2021-01-31 ENCOUNTER — Emergency Department (HOSPITAL_COMMUNITY): Payer: Medicare PPO

## 2021-01-31 DIAGNOSIS — S76802A Unspecified injury of other specified muscles, fascia and tendons at thigh level, left thigh, initial encounter: Secondary | ICD-10-CM | POA: Diagnosis present

## 2021-01-31 DIAGNOSIS — X58XXXA Exposure to other specified factors, initial encounter: Secondary | ICD-10-CM | POA: Diagnosis not present

## 2021-01-31 DIAGNOSIS — S76812A Strain of other specified muscles, fascia and tendons at thigh level, left thigh, initial encounter: Secondary | ICD-10-CM | POA: Diagnosis not present

## 2021-01-31 DIAGNOSIS — Z87891 Personal history of nicotine dependence: Secondary | ICD-10-CM | POA: Diagnosis not present

## 2021-01-31 DIAGNOSIS — I5032 Chronic diastolic (congestive) heart failure: Secondary | ICD-10-CM | POA: Diagnosis not present

## 2021-01-31 DIAGNOSIS — I110 Hypertensive heart disease with heart failure: Secondary | ICD-10-CM | POA: Diagnosis not present

## 2021-01-31 DIAGNOSIS — Z79899 Other long term (current) drug therapy: Secondary | ICD-10-CM | POA: Insufficient documentation

## 2021-01-31 DIAGNOSIS — Z8616 Personal history of COVID-19: Secondary | ICD-10-CM | POA: Diagnosis not present

## 2021-01-31 DIAGNOSIS — Z85828 Personal history of other malignant neoplasm of skin: Secondary | ICD-10-CM | POA: Insufficient documentation

## 2021-01-31 DIAGNOSIS — S76212A Strain of adductor muscle, fascia and tendon of left thigh, initial encounter: Secondary | ICD-10-CM

## 2021-01-31 DIAGNOSIS — Z7901 Long term (current) use of anticoagulants: Secondary | ICD-10-CM | POA: Diagnosis not present

## 2021-01-31 MED ORDER — METHOCARBAMOL 500 MG PO TABS: 500.0000 mg | ORAL_TABLET | Freq: Four times a day (QID) | ORAL | 0 refills | Status: DC

## 2021-01-31 NOTE — ED Provider Notes (Signed)
[facility] EMERGENCY DEPARTMENT Provider Note   CSN: [id number] Arrival date & time: 01/31/21  1344     History Chief Complaint  Patient presents with  . Groin Injury    Jorge West is a 75 y.o. male.  Pt reports he was getting in and out of a truck.   The history is provided by the patient. No language interpreter was used.  Groin Pain This is a new problem. The problem occurs constantly. The problem has not changed since onset.Nothing aggravates the symptoms. Nothing relieves the symptoms. He has tried nothing for the symptoms.       Past Medical History:  Diagnosis Date  . Atrial fibrillation [facility])    Had an ablation done  . Atypical mole 06/14/2000   moderate/marked on mid back [facility])  . Atypical mole 11/29/2001   slight/moderate on right shoulder ([facility])  . Atypical mole 11/29/2001   moderate on right lateral abdomen ([facility])  . Atypical mole 11/29/2001   moderate on right forearm ([facility])  . Atypical mole 01/11/2007   moderate on lower mid back  . Atypical mole 07/14/2008   SK and atypical solar lentigo on left jawline ([facility]ershave)  . Basal cell carcinoma 07/29/1996   back left lower ear - CX3+excision  . Basal cell carcinoma 09/22/2004   basosquamous on right tip of nose (MOHs)  . Basal cell carcinoma 10/20/2005   superficial on upper center back - CX3+5FU  . Basal cell carcinoma 10/20/2011   right upper back - tx p bx  . Basal cell carcinoma 02/07/2018   superficial/nodular on left upper arm - CX3+cautery+5FU  . Basal cell carcinoma 10/23/2018   infiltrative on left sideburn [facility])  . Benign prostate hyperplasia   . Cataract    left and removed  . Depression   . Depression   . Diverticulosis   . Erectile dysfunction   . Hyperlipidemia   . Hypertension   . Impaired fasting glucose   . Nocturia   . Personal history of colonic polyps 04/19/2005  . 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   left post auricular - clear  at visit on 12/28/2001  . Squamous cell carcinoma of skin 10/08/2003   well differentiated below outer left eye - MOHs  . Squamous cell carcinoma of skin 06/02/2008   well differentiated behind left ear  . Squamous cell carcinoma of skin 10/20/2011   KA on left elbow  . Squamous cell carcinoma of skin 03/02/2015   well differentiated on right outer cheek - CX3+excision  . Squamous cell carcinoma of skin 03/01/2017   well differentiated on left forearm - tx p bx  . Squamous cell carcinoma of skin 03/07/2018   moderately differentiated on left jawline - CX3+excision    Patient Active Problem List   Diagnosis Date Noted  . COVID-19 virus infection 10/24/2019  . AKI (acute kidney injury) ([place]) 10/24/2019  . Hypokalemia 10/24/2019  . Syncope and collapse 10/24/2019  . Syncope 10/23/2019  . AF (paroxysmal atrial fibrillation) ([place] ) 06/02/2019  . HTN (hypertension) 06/02/2019  . HLD (hyperlipidemia) 06/02/2019  . Chest pain in adult 06/02/2019  . Chronic diastolic CHF (congestive heart failure) (HCC)/EF 60 to 65 % 06/02/2019  . Metacarpal bone fracture 03/15/2012  . CLOSED FRACTURE OF LATERAL MALLEOLUS 01/03/2011  . DERANGEMENT MENISCUS 02/02/2009  . KNEE PAIN 02/02/2009  . Personal history of colonic polyps 04/19/2005    Past Surgical History:  Procedure Laterality Date  . CATARACT EXTRACTION    . COLONOSCOPY    .  OPEN REDUCTION INTERNAL FIXATION (ORIF) METACARPAL  09/24/2012   Procedure: OPEN REDUCTION INTERNAL FIXATION (ORIF) METACARPAL;  Surgeon: Linna Hoff, MD;  Location: [facility];  Service: Orthopedics;  Laterality: Left;  and proximal phalanges.  . surgery for skin cancer         Family History  Problem Relation Age of Onset  . Cancer Other        family history   . Colon cancer Neg Hx   . Colon polyps Neg Hx   . Rectal cancer Neg Hx   . Stomach cancer Neg Hx     Social History   Tobacco Use  . Smoking status: Never Smoker  . Smokeless tobacco: Former Systems developer   . Tobacco comment: 10-15 yeras quit   Vaping Use  . Vaping Use: Never used  Substance Use Topics  . Alcohol use: Not Currently    Alcohol/week: 0.0 standard drinks  . Drug use: Never    Home Medications Prior to Admission medications   Medication Sig Start Date End Date Taking? Authorizing Provider  methocarbamol (ROBAXIN) 500 MG tablet Take 1 tablet (500 mg total) by mouth 4 (four) times daily. 01/31/21  Yes Fransico Meadow, PA-C  acetaminophen (TYLENOL) 325 MG tablet Take 2 tablets (650 mg total) by mouth every 6 (six) hours as needed for mild pain (or Fever >/= 101). 06/03/19   Emokpae, Courage, MD  ALPRAZolam Duanne Moron) 0.5 MG tablet Take 0.5 mg by mouth 2 (two) times daily as needed for anxiety.    [provider]  amLODipine (NORVASC) 5 MG tablet Take 10 mg by mouth daily. 03/03/20   [provider]  apixaban (ELIQUIS) 5 MG TABS tablet Take 1 tablet (5 mg total) by mouth 2 (two) times daily. Blood thinner for stroke prevention 06/03/19   Roxan Hockey, MD  brimonidine-timolol (COMBIGAN) 0.2-0.5 % ophthalmic solution Place 1 drop into the left eye daily. 02/29/16   [provider]  buPROPion (WELLBUTRIN XL) 300 MG 24 hr tablet Take 300 mg by mouth daily.    [provider]  Cholecalciferol (VITAMIN D3) 3000 UNITS TABS Take 1 tablet by mouth daily.     [provider]  escitalopram (LEXAPRO) 10 MG tablet Take 10 mg by mouth daily.    [provider]  Omega-3 Fatty Acids (FISH OIL PO) Take 1 capsule by mouth daily.     [provider]  pantoprazole (PROTONIX) 40 MG tablet Take 40 mg by mouth 2 (two) times daily.    [provider]  pravastatin (PRAVACHOL) 20 MG tablet Take 20 mg by mouth daily.     [provider]  vitamin C (ASCORBIC ACID) 500 MG tablet Take 1 tablet (500 mg total) by mouth daily. Patient not taking: Reported on 11/11/2020 09/25/12   Iran Planas, MD    Allergies    Terazosin hcl  Review  of Systems   Review of Systems  All other systems reviewed and are negative.   Physical Exam Updated Vital Signs BP 136/82   Pulse 87   Temp 97.9 F (36.6 C) (Oral)   Resp 16   Ht 5\' 10"  (1.778 m)   Wt 89.4 kg   SpO2 94%   BMI 28.27 kg/m   Physical Exam Vitals reviewed.  Cardiovascular:     Rate and Rhythm: Normal rate.  Pulmonary:     Effort: Pulmonary effort is normal.  Musculoskeletal:        General: Normal range of motion.  Skin:  General: Skin is warm.  Neurological:     General: No focal deficit present.  Psychiatric:        Mood and Affect: Mood normal.     ED Results / Procedures / Treatments   Labs (all labs ordered are listed, but only abnormal results are displayed) Labs Reviewed - No data to display  EKG None  Radiology DG Femur Min 2 Views Left  Result Date: 01/31/2021 CLINICAL DATA:  Pain after twisting type injury. EXAM: LEFT FEMUR 2 VIEWS COMPARISON:  None. FINDINGS: Frontal and lateral views were obtained. No fracture or dislocation. No abnormal periosteal reaction. There is narrowing of the medial compartment of the left knee joint. No joint effusion. There are multiple foci of arterial vascular calcification. IMPRESSION: No fracture or dislocation. 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 No knee joint effusion. Multiple foci of atherosclerotic vascular calcification noted. Electronically Signed   By: Lowella Grip III M.D.   On: 01/31/2021 15:15    Procedures Procedures   Medications Ordered in ED Medications - No data to display  ED Course  I have reviewed the triage vital signs and the nursing notes.  Pertinent labs & imaging results that were available during my care of the patient were reviewed by me and considered in my medical decision making (see chart for details).    MDM Rules/Calculators/A&P                          MDM:  Xray femur no acute.  Pt counseled on groin strain.  Pt advised tylenol  Rx for robaxin.   Pt advised to ice/heat.  Recheck with primary care this week  Final Clinical Impression(s) / ED Diagnoses Final diagnoses:  Inguinal strain, left, initial encounter    Rx / DC Orders ED Discharge Orders         Ordered    methocarbamol (ROBAXIN) 500 MG tablet  4 times daily        01/31/21 1547        An After Visit Summary was printed and given to the patient.    Fransico Meadow, PA-C 01/31/21 1704    Lorelle Gibbs, DO 02/08/21 1512

## 2021-01-31 NOTE — Discharge Instructions (Signed)
Follow up with your Physician  this week for recheck.  Ice to area of pain.  Gentle stretching

## 2021-01-31 NOTE — ED Triage Notes (Signed)
Pt states he has a left inner thigh groin injury that happened Friday when he was entering a large truck.

## 2021-03-17 ENCOUNTER — Other Ambulatory Visit: Payer: Self-pay

## 2021-03-17 ENCOUNTER — Ambulatory Visit: Payer: Medicare PPO | Admitting: Dermatology

## 2021-03-17 ENCOUNTER — Encounter: Payer: Self-pay | Admitting: Dermatology

## 2021-03-17 DIAGNOSIS — L570 Actinic keratosis: Secondary | ICD-10-CM

## 2021-03-17 DIAGNOSIS — D485 Neoplasm of uncertain behavior of skin: Secondary | ICD-10-CM

## 2021-03-17 DIAGNOSIS — Z1283 Encounter for screening for malignant neoplasm of skin: Secondary | ICD-10-CM | POA: Diagnosis not present

## 2021-03-17 DIAGNOSIS — C4492 Squamous cell carcinoma of skin, unspecified: Secondary | ICD-10-CM

## 2021-03-17 DIAGNOSIS — C4442 Squamous cell carcinoma of skin of scalp and neck: Secondary | ICD-10-CM | POA: Diagnosis not present

## 2021-03-17 DIAGNOSIS — Z85828 Personal history of other malignant neoplasm of skin: Secondary | ICD-10-CM | POA: Diagnosis not present

## 2021-03-17 HISTORY — DX: Squamous cell carcinoma of skin, unspecified: C44.92

## 2021-03-17 NOTE — Patient Instructions (Signed)

## 2021-03-23 ENCOUNTER — Telehealth: Payer: Self-pay | Admitting: Dermatology

## 2021-03-23 NOTE — Telephone Encounter (Signed)
Path report not back yet patient notified.

## 2021-03-23 NOTE — Telephone Encounter (Signed)
Patient is calling for pathology results from last visit with Stuart Tafeen, MD 

## 2021-03-25 ENCOUNTER — Telehealth: Payer: Self-pay

## 2021-03-25 NOTE — Telephone Encounter (Signed)
Phone call to patient with his pathology results.  Unable to leave message due to patient's voicemail being full.

## 2021-03-25 NOTE — Telephone Encounter (Signed)
-----   Message from Stuart Tafeen, MD sent at 03/25/2021  7:14 AM EDT ----- Schedule surgery with Dr. T 

## 2021-03-28 ENCOUNTER — Encounter: Payer: Self-pay | Admitting: Dermatology

## 2021-03-28 NOTE — Progress Notes (Signed)
   Follow-Up Visit   Subjective  Jorge West is a 75 y.o. male who presents for the following: Annual Exam (Left temple x months- keeps coming back, new lesions behind both ears).  New spot left temple and behind ear. Location:  Duration:  Quality:  Associated Signs/Symptoms: Modifying Factors:  Severity:  Timing: Context:   Objective  Well appearing patient in no apparent distress; mood and affect are within normal limits. Objective  Right Postauricular Area: 5 mm gritty pink crust  Objective  Left Temporal Scalp: Waxy moderately thick 8 mm crust, rule out SCCA     Objective  Left Zygomatic Area: Infiltrative basal cell 2019, no sign recurrence    A focused examination was performed including Head and neck.. Relevant physical exam findings are noted in the Assessment and Plan.   Assessment & Plan    AK (actinic keratosis) Right Postauricular Area  Will return for biopsy if freezing fails  Destruction of lesion - Right Postauricular Area Complexity: simple   Destruction method: cryotherapy   Informed consent: discussed and consent obtained   Lesion destroyed using liquid nitrogen: Yes   Cryotherapy cycles:  3 Outcome: patient tolerated procedure well with no complications    Neoplasm of uncertain behavior of skin Left Temporal Scalp  Skin / nail biopsy Type of biopsy: tangential   Informed consent: discussed and consent obtained   Timeout: patient name, date of birth, surgical site, and procedure verified   Anesthesia: the lesion was anesthetized in a standard fashion   Anesthetic:  1% lidocaine w/ epinephrine 1-100,000 local infiltration Instrument used: flexible razor blade   Hemostasis achieved with: ferric subsulfate   Outcome: patient tolerated procedure well   Post-procedure details: wound care instructions given    Specimen 1 - Surgical pathology Differential Diagnosis: BCC possible recurrent.   Check Margins: No  Personal history of  skin cancer Left Zygomatic Area  Recheck as needed change      I, Lavonna Monarch, MD, have reviewed all documentation for this visit.  The documentation on 03/28/21 for the exam, diagnosis, procedures, and orders are all accurate and complete.

## 2021-03-29 NOTE — Telephone Encounter (Signed)
Phone call from patient returning our call.  Patient's pathology results given to him.

## 2021-03-29 NOTE — Telephone Encounter (Signed)
-----   Message from Lavonna Monarch, MD sent at 03/25/2021  7:14 AM EDT ----- Schedule surgery with Dr. Darene Lamer

## 2021-05-27 ENCOUNTER — Ambulatory Visit (INDEPENDENT_AMBULATORY_CARE_PROVIDER_SITE_OTHER): Payer: Medicare PPO | Admitting: Dermatology

## 2021-05-27 ENCOUNTER — Other Ambulatory Visit: Payer: Self-pay

## 2021-05-27 ENCOUNTER — Encounter: Payer: Self-pay | Admitting: Dermatology

## 2021-05-27 DIAGNOSIS — C4442 Squamous cell carcinoma of skin of scalp and neck: Secondary | ICD-10-CM

## 2021-05-27 DIAGNOSIS — C4492 Squamous cell carcinoma of skin, unspecified: Secondary | ICD-10-CM

## 2021-05-27 DIAGNOSIS — D492 Neoplasm of unspecified behavior of bone, soft tissue, and skin: Secondary | ICD-10-CM

## 2021-05-27 NOTE — Patient Instructions (Signed)

## 2021-06-07 ENCOUNTER — Telehealth: Payer: Self-pay | Admitting: *Deleted

## 2021-06-07 NOTE — Telephone Encounter (Signed)
-----   Message from Lavonna Monarch, MD sent at 06/04/2021  5:57 AM EDT ----- Schedule Mohs

## 2021-06-07 NOTE — Telephone Encounter (Signed)
Path to patient mohs info sent to the skin surgery center.

## 2021-06-11 ENCOUNTER — Encounter: Payer: Self-pay | Admitting: Dermatology

## 2021-06-11 NOTE — Progress Notes (Signed)
   Follow-Up Visit   Subjective  Jorge West is a 75 y.o. male who presents for the following: Procedure (Patient here today for SCC x 1 Temporal Scalp).  Apsey proven skin cancer left temple hairline plus possible new lesion in front of this. Location:  Duration:  Quality:  Associated Signs/Symptoms: Modifying Factors:  Severity:  Timing: Context:   Objective  Well appearing patient in no apparent distress; mood and affect are within normal limits. Left Temporal Scalp Biopsy showed a well differentiated squamous cell carcinoma which was at the hairline.  Although this was treated with curettage and inoculation of fluorouracil, there is a more nodular lesion adjacent to the original biopsy which will be biopsied and, if appropriate, may be referred for Mohs surgery.  Left Temporal Scalp Focally eroded nodular 1.2 sonometer lesion.         A focused examination was performed including head and neck.. Relevant physical exam findings are noted in the Assessment and Plan.   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  Destruction of lesion Complexity: simple   Destruction method: electrodesiccation and curettage   Informed consent: discussed and consent obtained   Timeout:  patient name, date of birth, surgical site, and procedure verified Anesthesia: the lesion was anesthetized in a standard fashion   Anesthetic:  1% lidocaine w/ epinephrine 1-100,000 local infiltration Curettage performed in three different directions: Yes   Curettage cycles:  3 Lesion length (cm):  1.4 Lesion width (cm):  1.4 Margin per side (cm):  0 Final wound size (cm):  1.4 Hemostasis achieved with:  ferric subsulfate Outcome: patient tolerated procedure well with no complications   Additional details:  Wound innoculated with 5 fluorouracil solution.  Neoplasm of skin Left Temporal Scalp  Skin / nail biopsy Type of biopsy: tangential   Informed consent:  discussed and consent obtained   Timeout: patient name, date of birth, surgical site, and procedure verified   Procedure prep:  Patient was prepped and draped in usual sterile fashion (Non sterile) Prep type:  Chlorhexidine Anesthesia: the lesion was anesthetized in a standard fashion   Anesthetic:  1% lidocaine w/ epinephrine 1-100,000 local infiltration Instrument used: flexible razor blade   Outcome: patient tolerated procedure well   Post-procedure details: wound care instructions given    Specimen 1 - Surgical pathology Differential Diagnosis: bcc vs scc  Check Margins: No      I, Lavonna Monarch, MD, have reviewed all documentation for this visit.  The documentation on 06/11/21 for the exam, diagnosis, procedures, and orders are all accurate and complete.

## 2021-08-30 ENCOUNTER — Other Ambulatory Visit: Payer: Self-pay

## 2021-08-30 ENCOUNTER — Encounter: Payer: Self-pay | Admitting: Dermatology

## 2021-08-30 ENCOUNTER — Ambulatory Visit: Payer: Medicare PPO | Admitting: Dermatology

## 2021-08-30 DIAGNOSIS — Z85828 Personal history of other malignant neoplasm of skin: Secondary | ICD-10-CM

## 2021-08-30 DIAGNOSIS — L570 Actinic keratosis: Secondary | ICD-10-CM

## 2021-09-13 ENCOUNTER — Encounter: Payer: Self-pay | Admitting: Dermatology

## 2021-09-13 NOTE — Progress Notes (Signed)
   Follow-Up Visit   Subjective  Jorge West is a 75 y.o. male who presents for the following: Follow-up (3 month f/u- left temporal scalp- Mohs done - no concerns healing good. ).  History of skin cancers, several new crusts Location:  Duration:  Quality:  Associated Signs/Symptoms: Modifying Factors:  Severity:  Timing: Context:   Objective  Well appearing patient in no apparent distress; mood and affect are within normal limits. Left Mid Helix, Left Zygomatic Area Small actinic keratoses, none currently bothersome or historically changing  Head - Anterior (Face) No sign residual carcinoma, good aesthetic result    A focused examination was performed including head, neck, arms.. Relevant physical exam findings are noted in the Assessment and Plan.   Assessment & Plan    AK (actinic keratosis) (2) Left Mid Helix; Left Zygomatic Area  Recheck if there is clinical change.  Annual skin exam.  Personal history of skin cancer Head - Anterior (Face)  Recheck as needed change      I, Lavonna Monarch, MD, have reviewed all documentation for this visit.  The documentation on 09/13/21 for the exam, diagnosis, procedures, and orders are all accurate and complete.

## 2021-12-16 IMAGING — CT CT CERVICAL SPINE W/O CM
3 of 4 series · 13 of 33 positions shown, 16 images · non-contrast
Comparison: October 23, 2019 CT of the head

CLINICAL DATA: History of fall, laceration to scalp in this patient
on blood thinners

EXAM:
CT HEAD WITHOUT CONTRAST
CT CERVICAL SPINE WITHOUT CONTRAST
TECHNIQUE: Multidetector CT imaging of the head and cervical spine was
performed following the standard protocol without intravenous
contrast. Multiplanar CT image reconstructions of the cervical spine
were also generated.

[Series 5: sagittal bone · sagittal · 0.32mm/px · 5 of 64 slices shown, 6 images]
[im 22/64  bone]
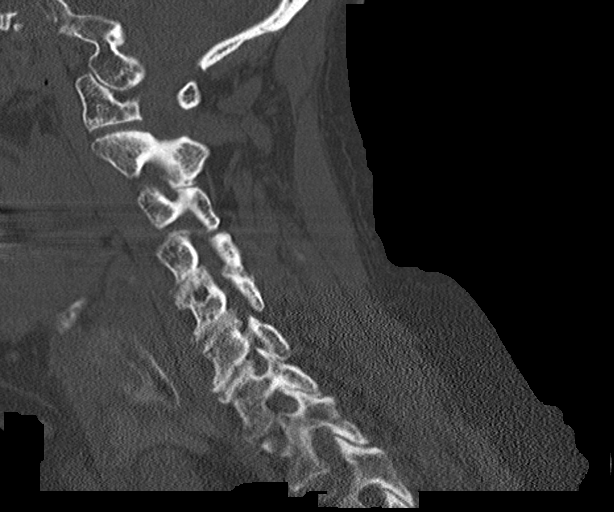
[im 27/64  bone]
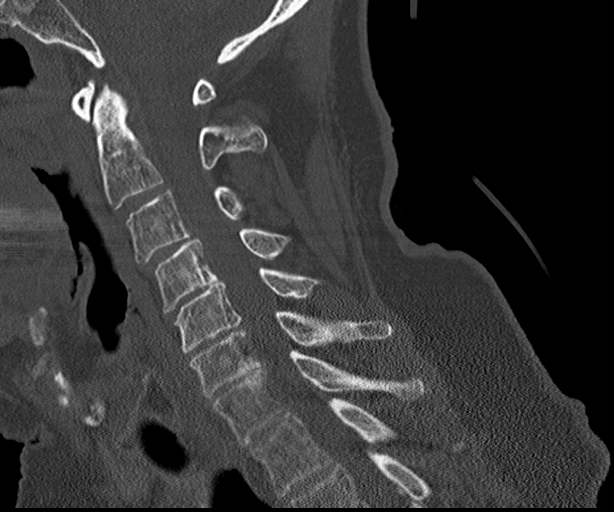
[im 32/64  soft-tissue]
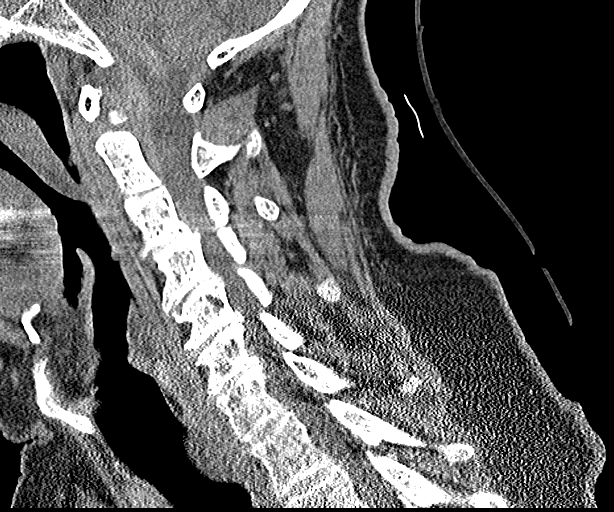
[im 32/64  bone]
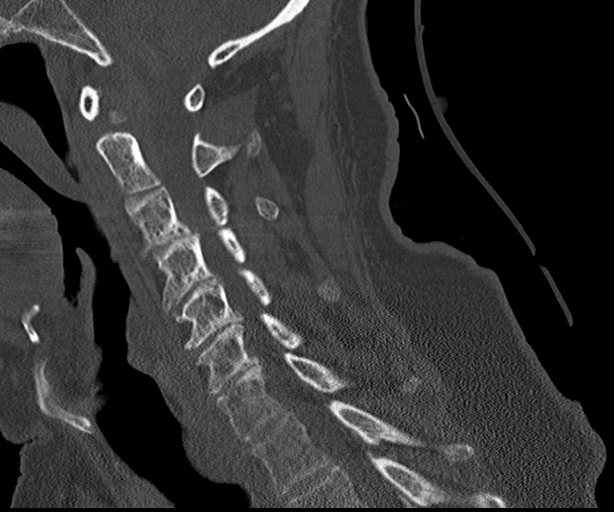
[im 37/64  bone]
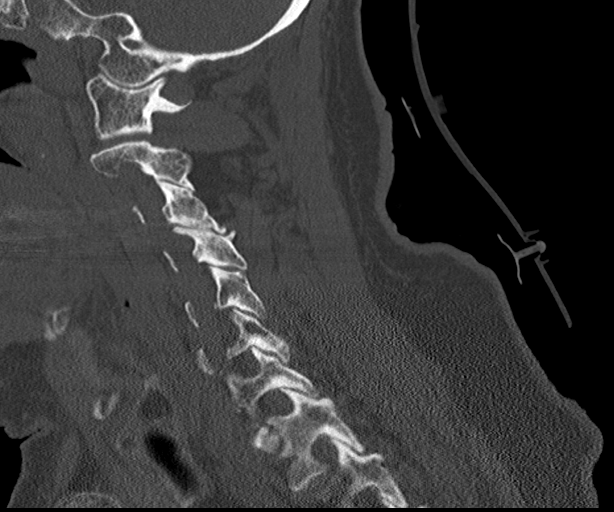
[im 43/64  bone]
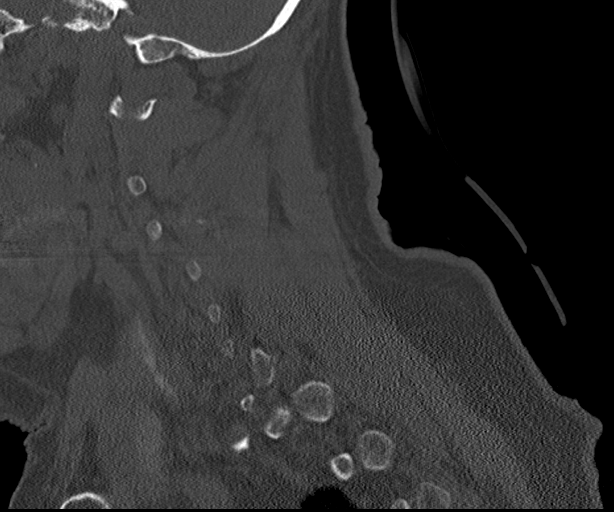

[Series 6: coronal bone · coronal · 0.31mm/px · 3 of 67 slices shown]
[im 14/67  bone]
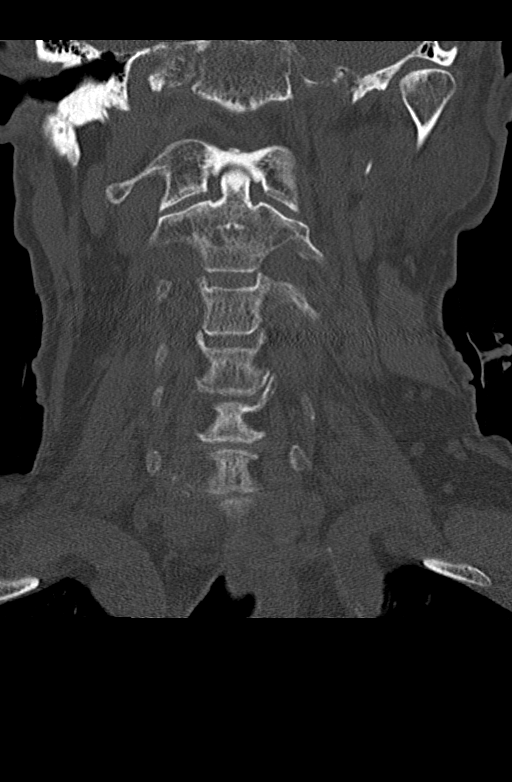
[im 27/67  bone]
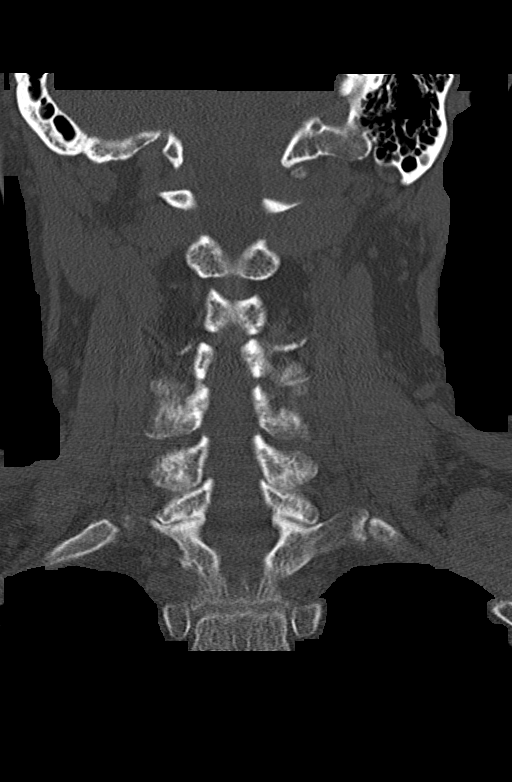
[im 40/67  bone]
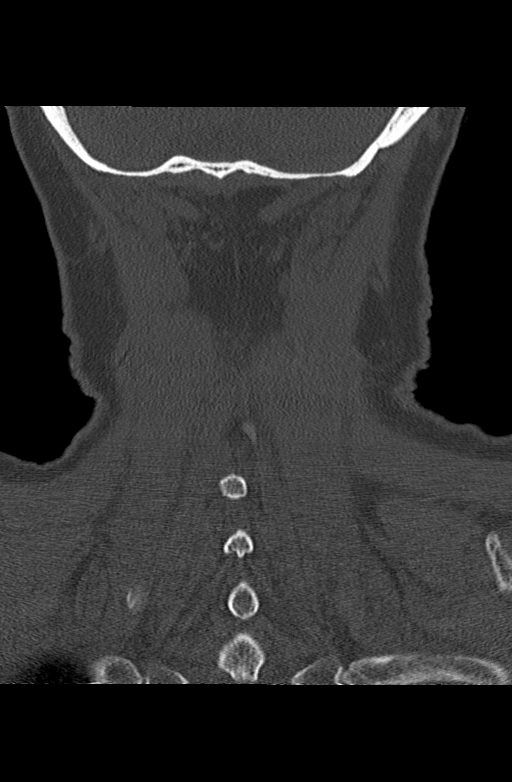

[Series 7: orthogonal axials · axial · 0.21mm/px · z∈[-130,-35]mm · 5 of 82 slices shown, 7 images]
[im 14/82  soft-tissue]
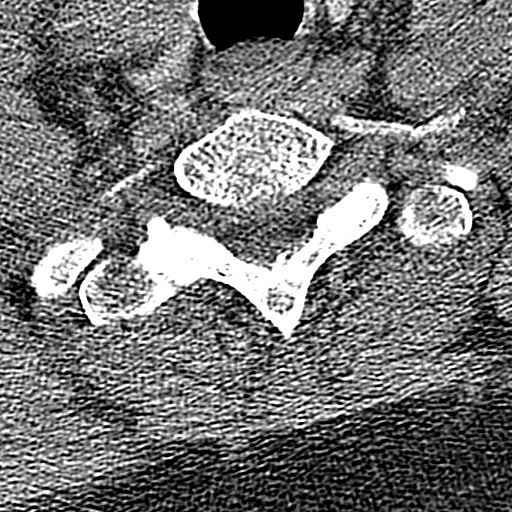
[im 14/82  bone]
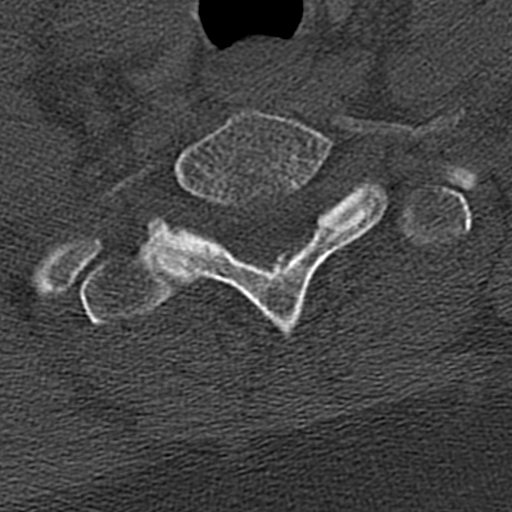
[im 28/82  bone]
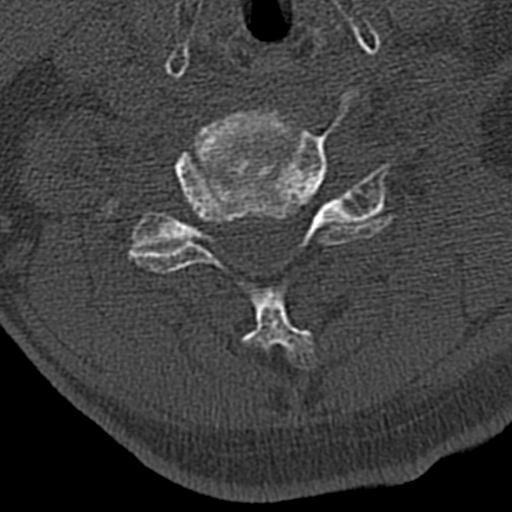
[im 41/82  bone]
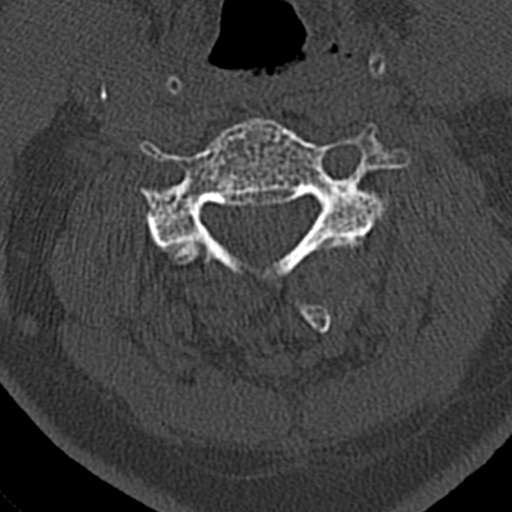
[im 55/82  bone]
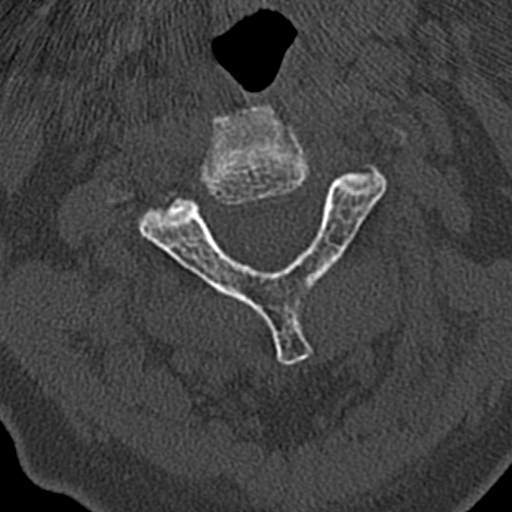
[im 68/82  soft-tissue]
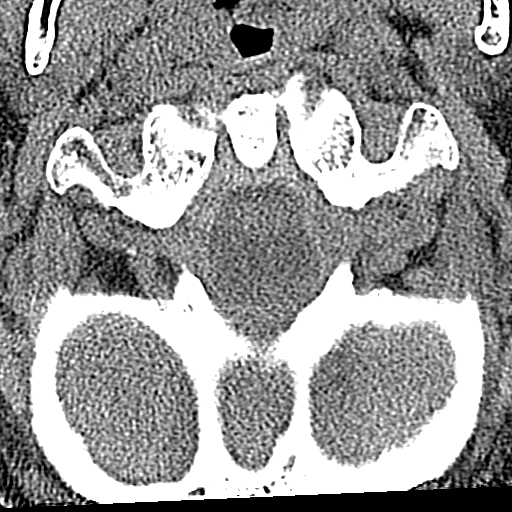
[im 68/82  bone]
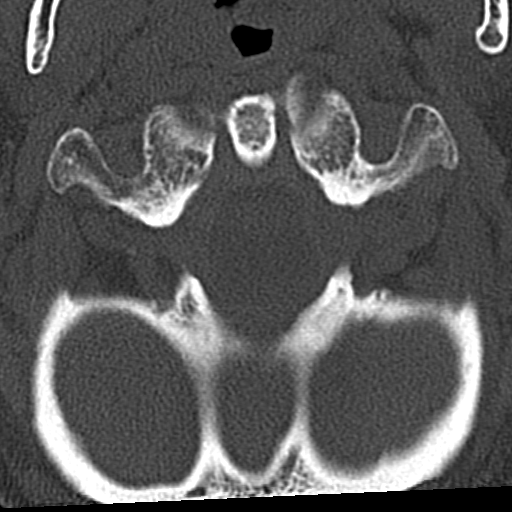

[13 of 33 positions shown; findings below may reference images not displayed]

FINDINGS: CT HEAD FINDINGS

Brain: No evidence of acute infarction, hemorrhage, hydrocephalus,
extra-axial collection or mass lesion/mass effect. Signs of atrophy
as before.

Vascular: No hyperdense vessel or unexpected calcification.

Skull: Normal. Negative for fracture or focal lesion.

Sinuses/Orbits: Signs of chronic sinusitis in the sphenoid sinus is
perhaps slightly improved compared to the previous imaging study
with only partial opacification of the sphenoid sinuses. No acute
orbital finding. Maxillary sinuses are clear with respect imaged
portions, incompletely imaged on today's exam.

Other: Subtle scalp laceration without significant subcutaneous
hematoma underlying skull fracture.

CT CERVICAL SPINE FINDINGS

Alignment: Straightening of normal cervical lordotic curvature may
be due to patient position or spasm.

Skull base and vertebrae: No acute fracture. No primary bone lesion
or focal pathologic process.

Soft tissues and spinal canal: No prevertebral fluid or swelling. No
visible canal hematoma.

Disc levels: Multilevel degenerative changes throughout the cervical
spine disc space narrowing disc space narrowing greatest at C4-5 and
C5-6 as well as C6-7 with mild anterior osteophyte formation and
uncovertebral spurring with signs of neural foraminal narrowing
greatest on the RIGHT at C5-6 and C6-7. Facet arthropathy with
fusion of facets on the RIGHT at C4-5, bony fusion in the setting of
degenerative change.

Mild degenerative anterolisthesis of C3 on C4, approximately 2 mm.

Upper chest: Negative.

Other: None
IMPRESSION: 1. No acute intracranial abnormality.
2. Signs of scalp laceration along the RIGHT paramidline scalp.
3. No evidence of acute fracture or subluxation of the cervical
spine.
4. Multilevel degenerative changes of the cervical spine as
described.
5. Signs of chronic sphenoid sinusitis, perhaps slightly improved
compared to the previous imaging study.

## 2022-08-30 ENCOUNTER — Ambulatory Visit: Payer: Medicare PPO | Admitting: Dermatology

## 2023-02-01 ENCOUNTER — Encounter: Payer: Self-pay | Admitting: Internal Medicine

## 2023-07-18 DIAGNOSIS — Z9889 Other specified postprocedural states: Secondary | ICD-10-CM | POA: Insufficient documentation

## 2023-07-26 DIAGNOSIS — R0609 Other forms of dyspnea: Secondary | ICD-10-CM | POA: Insufficient documentation

## 2023-08-25 ENCOUNTER — Encounter (HOSPITAL_COMMUNITY): Payer: Self-pay

## 2023-08-30 ENCOUNTER — Encounter (HOSPITAL_COMMUNITY)
Admission: RE | Admit: 2023-08-30 | Discharge: 2023-08-30 | Disposition: A | Discharge status: Home / Self Care | Payer: Medicare PPO | Source: Ambulatory Visit | Attending: Internal Medicine | Admitting: Internal Medicine

## 2023-08-30 DIAGNOSIS — Z9861 Coronary angioplasty status: Secondary | ICD-10-CM | POA: Insufficient documentation

## 2023-08-30 DIAGNOSIS — Z955 Presence of coronary angioplasty implant and graft: Secondary | ICD-10-CM | POA: Insufficient documentation

## 2023-09-05 ENCOUNTER — Encounter (HOSPITAL_COMMUNITY)
Admission: RE | Admit: 2023-09-05 | Discharge: 2023-09-05 | Disposition: A | Discharge status: Home / Self Care | Payer: Medicare PPO | Source: Ambulatory Visit | Attending: Internal Medicine | Admitting: Internal Medicine

## 2023-09-05 VITALS — Ht 67.0 in | Wt 186.1 lb

## 2023-09-05 DIAGNOSIS — Z955 Presence of coronary angioplasty implant and graft: Secondary | ICD-10-CM

## 2023-09-05 DIAGNOSIS — Z9861 Coronary angioplasty status: Secondary | ICD-10-CM | POA: Diagnosis present

## 2023-09-05 NOTE — Patient Instructions (Signed)
Patient Instructions  Patient Details  Name: Jorge West MRN: [id number] Date of Birth: 05/15/1946 Referring Provider:  Randa Ngo, MD  Below are your personal goals for exercise, nutrition, and risk factors. Our goal is to help you stay on track towards obtaining and maintaining these goals. We will be discussing your progress on these goals with you throughout the program.  Initial Exercise Prescription:  Initial Exercise Prescription - 09/05/23 0900       Date of Initial Exercise RX and Referring Provider   Date 09/05/23    Referring Provider Lucrezia Starch MD      Oxygen   Maintain Oxygen Saturation 88% or higher      NuStep   Level 2    SPM 80    Minutes 15    METs 2      Arm Ergometer   Level 1    Watts 25    RPM 25    Minutes 15    METs 2      Prescription Details   Frequency (times per week) 3    Duration Progress to 30 minutes of continuous aerobic without signs/symptoms of physical distress      Intensity   THRR 40-80% of Max Heartrate 108-132    Ratings of Perceived Exertion 11-13    Perceived Dyspnea 0-4      Progression   Progression Continue to progress workloads to maintain intensity without signs/symptoms of physical distress.      Resistance Training   Training Prescription Yes    Weight 5 lb    Reps 10-15             Exercise Goals: Frequency: Be able to perform aerobic exercise two to three times per week in program working toward 2-5 days per week of home exercise.  Intensity: Work with a perceived exertion of 11 (fairly light) - 15 (hard) while following your exercise prescription.  We will make changes to your prescription with you as you progress through the program.   Duration: Be able to do 30 to 45 minutes of continuous aerobic exercise in addition to a 5 minute warm-up and a 5 minute cool-down routine.   Nutrition Goals: Your personal nutrition goals will be established when you do your nutrition analysis with  the dietician.  The following are general nutrition guidelines to follow: Cholesterol < 200mg /day Sodium < 1500mg /day Fiber: Men over 50 yrs - 30 grams per day  Personal Goals:  Personal Goals and Risk Factors at Admission - 09/05/23 0939       Core Components/Risk Factors/Patient Goals on Admission    Weight Management Yes;Weight Loss;Weight Maintenance    Intervention Weight Management: Develop a combined nutrition and exercise program designed to reach desired caloric intake, while maintaining appropriate intake of nutrient and fiber, sodium and fats, and appropriate energy expenditure required for the weight goal.;Weight Management: Provide education and appropriate resources to help participant work on and attain dietary goals.;Weight Management/Obesity: Establish reasonable short term and long term weight goals.    Admit Weight 186 lb 1.6 oz (84.4 kg)    Goal Weight: Short Term 183 lb (83 kg)    Goal Weight: Long Term 183 lb (83 kg)    Expected Outcomes Short Term: Continue to assess and modify interventions until short term weight is achieved;Long Term: Adherence to nutrition and physical activity/exercise program aimed toward attainment of established weight goal;Weight Maintenance: Understanding of the daily nutrition guidelines, which includes 25-35% calories from fat, 7%  or less cal from saturated fats, less than 200mg  cholesterol, less than 1.5gm of sodium, & 5 or more servings of fruits and vegetables daily;Weight Loss: Understanding of general recommendations for a balanced deficit meal plan, which promotes 1-2 lb weight loss per week and includes a negative energy balance of [phone] kcal/d;Understanding recommendations for meals to include 15-35% energy as protein, 25-35% energy from fat, 35-60% energy from carbohydrates, less than 200mg  of dietary cholesterol, 20-35 gm of total fiber daily;Understanding of distribution of calorie intake throughout the day with the consumption of 4-5  meals/snacks    Improve shortness of breath with ADL's Yes    Intervention Provide education, individualized exercise plan and daily activity instruction to help decrease symptoms of SOB with activities of daily living.    Expected Outcomes Short Term: Improve cardiorespiratory fitness to achieve a reduction of symptoms when performing ADLs;Long Term: Be able to perform more ADLs without symptoms or delay the onset of symptoms    Heart Failure Yes    Intervention Provide a combined exercise and nutrition program that is supplemented with education, support and counseling about heart failure. Directed toward relieving symptoms such as shortness of breath, decreased exercise tolerance, and extremity edema.    Expected Outcomes Improve functional capacity of life;Short term: Attendance in program 2-3 days a week with increased exercise capacity. Reported lower sodium intake. Reported increased fruit and vegetable intake. Reports medication compliance.;Short term: Daily weights obtained and reported for increase. Utilizing diuretic protocols set by physician.;Long term: Adoption of self-care skills and reduction of barriers for early signs and symptoms recognition and intervention leading to self-care maintenance.    Hypertension Yes    Intervention Provide education on lifestyle modifcations including regular physical activity/exercise, weight management, moderate sodium restriction and increased consumption of fresh fruit, vegetables, and low fat dairy, alcohol moderation, and smoking cessation.;Monitor prescription use compliance.    Expected Outcomes Short Term: Continued assessment and intervention until BP is < 140/60mm HG in hypertensive participants. < 130/7mm HG in hypertensive participants with diabetes, heart failure or chronic kidney disease.;Long Term: Maintenance of blood pressure at goal levels.    Lipids Yes    Intervention Provide education and support for participant on nutrition &  aerobic/resistive exercise along with prescribed medications to achieve LDL 70mg , HDL >40mg .    Expected Outcomes Short Term: Participant states understanding of desired cholesterol values and is compliant with medications prescribed. Participant is following exercise prescription and nutrition guidelines.;Long Term: Cholesterol controlled with medications as prescribed, with individualized exercise RX and with personalized nutrition plan. Value goals: LDL < 70mg , HDL > 40 mg.             Tobacco Use Initial Evaluation: Social History   Tobacco Use  Smoking Status Never  Smokeless Tobacco Former  Tobacco Comments   10-15 yeras quit     Exercise Goals and Review:  Exercise Goals     Row Name 09/05/23 [phone]             Exercise Goals   Increase Physical Activity Yes       Intervention Provide advice, education, support and counseling about physical activity/exercise needs.;Develop an individualized exercise prescription for aerobic and resistive training based on initial evaluation findings, risk stratification, comorbidities and participant's personal goals.       Expected Outcomes Short Term: Attend rehab on a regular basis to increase amount of physical activity.;Long Term: Add in home exercise to make exercise part of routine and to increase amount of physical  activity.;Long Term: Exercising regularly at least 3-5 days a week.       Increase Strength and Stamina Yes       Intervention Provide advice, education, support and counseling about physical activity/exercise needs.;Develop an individualized exercise prescription for aerobic and resistive training based on initial evaluation findings, risk stratification, comorbidities and participant's personal goals.       Expected Outcomes Short Term: Increase workloads from initial exercise prescription for resistance, speed, and METs.;Short Term: Perform resistance training exercises routinely during rehab and add in resistance  training at home;Long Term: Improve cardiorespiratory fitness, muscular endurance and strength as measured by increased METs and functional capacity ( )       Able to understand and use rate of perceived exertion (RPE) scale Yes       Intervention Provide education and explanation on how to use RPE scale       Expected Outcomes Short Term: Able to use RPE daily in rehab to express subjective intensity level;Long Term:  Able to use RPE to guide intensity level when exercising independently       Able to understand and use Dyspnea scale Yes       Intervention Provide education and explanation on how to use Dyspnea scale       Expected Outcomes Short Term: Able to use Dyspnea scale daily in rehab to express subjective sense of shortness of breath during exertion;Long Term: Able to use Dyspnea scale to guide intensity level when exercising independently       Knowledge and understanding of Target Heart Rate Range (THRR) Yes       Intervention Provide education and explanation of THRR including how the numbers were predicted and where they are located for reference       Expected Outcomes Short Term: Able to state/look up THRR;Long Term: Able to use THRR to govern intensity when exercising independently;Short Term: Able to use daily as guideline for intensity in rehab       Able to check pulse independently Yes       Intervention Provide education and demonstration on how to check pulse in carotid and radial arteries.;Review the importance of being able to check your own pulse for safety during independent exercise       Expected Outcomes Short Term: Able to explain why pulse checking is important during independent exercise;Long Term: Able to check pulse independently and accurately       Understanding of Exercise Prescription Yes       Intervention Provide education, explanation, and written materials on patient's individual exercise prescription       Expected Outcomes Short Term: Able to explain  program exercise prescription;Long Term: Able to explain home exercise prescription to exercise independently              Copy of goals given to participant.

## 2023-09-05 NOTE — Progress Notes (Signed)
Cardiac Individual Treatment Plan  Patient Details  Name: Jorge West MRN: [id number] Date of Birth: 08/01/1946 Referring Provider:   Flowsheet Row CARDIAC REHAB PHASE II ORIENTATION from 09/05/2023 in [place] [facility] CARDIAC REHABILITATION  Referring Provider Lucrezia Starch MD       Initial Encounter Date:  Flowsheet Row CARDIAC REHAB PHASE II ORIENTATION from 09/05/2023 in [place] CARDIAC REHABILITATION  Date 09/05/23       Visit Diagnosis: Status post coronary artery stent placement  Postsurgical percutaneous transluminal coronary angioplasty (PTCA) status  Patient's Home Medications on Admission:  Current Outpatient Medications:    acetaminophen (TYLENOL) 325 MG tablet, Take 2 tablets (650 mg total) by mouth every 6 (six) hours as needed for mild pain (or Fever >/= 101)., Disp: 12 tablet, Rfl: 0   ALPRAZolam (XANAX) 0.5 MG tablet, Take 0.5 mg by mouth 2 (two) times daily as needed for anxiety., Disp: , Rfl:    amLODipine (NORVASC) 5 MG tablet, Take 10 mg by mouth daily. (Patient not taking: Reported on 08/30/2023), Disp: , Rfl:    apixaban (ELIQUIS) 5 MG TABS tablet, Take 1 tablet (5 mg total) by mouth 2 (two) times daily. Blood thinner for stroke prevention, Disp: 60 tablet, Rfl: 5   brimonidine-timolol (COMBIGAN) 0.2-0.5 % ophthalmic solution, Place 1 drop into the left eye daily., Disp: , Rfl:    buPROPion (WELLBUTRIN XL) 300 MG 24 hr tablet, Take 300 mg by mouth daily., Disp: , Rfl:    Cholecalciferol (VITAMIN D3) 3000 UNITS TABS, Take 1 tablet by mouth daily. , Disp: , Rfl:    clopidogrel (PLAVIX) 75 MG tablet, Take 75 mg by mouth daily., Disp: , Rfl:    cyanocobalamin (VITAMIN B12) 100 MCG tablet, Take 100 mcg by mouth daily., Disp: , Rfl:    escitalopram (LEXAPRO) 10 MG tablet, Take 10 mg by mouth daily., Disp: , Rfl:    methocarbamol (ROBAXIN) 500 MG tablet, Take 1 tablet (500 mg total) by mouth 4 (four) times daily., Disp: 20 tablet, Rfl: 0   metoprolol tartrate  (LOPRESSOR) 25 MG tablet, Take 25 mg by mouth daily., Disp: , Rfl:    nitroGLYCERIN (NITROSTAT) 0.4 MG SL tablet, Place 0.4 mg under the tongue every 5 (five) minutes as needed for chest pain., Disp: , Rfl:    Omega-3 Fatty Acids (FISH OIL PO), Take 1 capsule by mouth daily. , Disp: , Rfl:    pantoprazole (PROTONIX) 40 MG tablet, Take 40 mg by mouth daily., Disp: , Rfl:    pravastatin (PRAVACHOL) 20 MG tablet, Take 20 mg by mouth daily.  (Patient not taking: Reported on 08/30/2023), Disp: , Rfl:    rosuvastatin (CRESTOR) 40 MG tablet, Take 40 mg by mouth daily., Disp: , Rfl:    sacubitril-valsartan (ENTRESTO) 24-26 MG, Take 1 tablet by mouth daily., Disp: , Rfl:    vitamin C (ASCORBIC ACID) 500 MG tablet, Take 1 tablet (500 mg total) by mouth daily., Disp: 90 tablet, Rfl: 0  Past Medical History: Past Medical History:  Diagnosis Date   Atrial fibrillation (HCC)    Had an ablation done   Atypical mole 06/14/2000   moderate/marked on mid back Nino Glow)   Atypical mole 11/29/2001   slight/moderate on right shoulder Nino Glow)   Atypical mole 11/29/2001   moderate on right lateral abdomen Nino Glow)   Atypical mole 11/29/2001   moderate on right forearm Nino Glow)   Atypical mole 01/11/2007   moderate on lower mid back   Atypical mole 07/14/2008  SK and atypical solar lentigo on left jawline (widershave)   Basal cell carcinoma 07/29/1996   back left lower ear - CX3+excision   Basal cell carcinoma 09/22/2004   basosquamous on right tip of nose (MOHs)   Basal cell carcinoma 10/20/2005   superficial on upper center back - CX3+5FU   Basal cell carcinoma 10/20/2011   right upper back - tx p bx   Basal cell carcinoma 02/07/2018   superficial/nodular on left upper arm - CX3+cautery+5FU   Basal cell carcinoma 10/23/2018   infiltrative on left sideburn [facility])   Benign prostate hyperplasia    Cataract    left and removed   Depression    Depression    Diverticulosis    Erectile  dysfunction    Hyperlipidemia    Hypertension    Impaired fasting glucose    Nocturia    Personal history of colonic polyps 04/19/2005   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   Left Temporal Scalp (well diff)   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   left post auricular - clear at visit on 12/28/2001   Squamous cell carcinoma of skin 10/08/2003   well differentiated below outer left eye - MOHs   Squamous cell carcinoma of skin 06/02/2008   well differentiated behind left ear   Squamous cell carcinoma of skin 10/20/2011   KA on left elbow   Squamous cell carcinoma of skin 03/02/2015   well differentiated on right outer cheek - CX3+excision   Squamous cell carcinoma of skin 03/01/2017   well differentiated on left forearm - tx p bx   Squamous cell carcinoma of skin 03/07/2018   moderately differentiated on left jawline - CX3+excision    Tobacco Use: Social History   Tobacco Use  Smoking Status Never  Smokeless Tobacco Former  Tobacco Comments   10-15 yeras quit     Labs: Review Flowsheet       Latest Ref Rng & Units 09/24/2012  Labs for ITP Cardiac and Pulmonary Rehab  TCO2 0 - 100 mmol/L 22     Details            Capillary Blood Glucose: No results found for: "GLUCAP"   Exercise Target Goals: Exercise Program Goal: Individual exercise prescription set using results from initial 6 min walk test and THRR while considering  patient's activity barriers and safety.   Exercise Prescription Goal: Starting with aerobic activity 30 plus minutes a day, 3 days per week for initial exercise prescription. Provide home exercise prescription and guidelines that participant acknowledges understanding prior to discharge.  Activity Barriers & Risk Stratification:  Activity Barriers & Cardiac Risk Stratification - 09/05/23 0936       Activity Barriers & Cardiac Risk Stratification   Activity Barriers Arthritis;Shortness of Breath;Deconditioning;Muscular  Weakness;Balance Concerns    Cardiac Risk Stratification Moderate             6 Minute Walk:  6 Minute Walk     Row Name 09/05/23 0935         6 Minute Walk   Phase Initial     Distance 885 feet     Walk Time 6 minutes     # of Rest Breaks 0     MPH 1.67     METS 1.89     RPE 13     Perceived Dyspnea  2     VO2 Peak 6.63     Symptoms Yes (comment)     Comments SOB, legs fatigued  Resting HR 85 bpm     Resting BP 124/66     Resting Oxygen Saturation  96 %     Exercise Oxygen Saturation  during 6 min walk 95 %     Max Ex. HR 117 bpm  got up to 134 with weights     Max Ex. BP 134/72     2 Minute Post BP 124/56              Oxygen Initial Assessment:   Oxygen Re-Evaluation:   Oxygen Discharge (Final Oxygen Re-Evaluation):   Initial Exercise Prescription:  Initial Exercise Prescription - 09/05/23 0900       Date of Initial Exercise RX and Referring Provider   Date 09/05/23    Referring Provider Lucrezia Starch MD      Oxygen   Maintain Oxygen Saturation 88% or higher      NuStep   Level 2    SPM 80    Minutes 15    METs 2      Arm Ergometer   Level 1    Watts 25    RPM 25    Minutes 15    METs 2      Prescription Details   Frequency (times per week) 3    Duration Progress to 30 minutes of continuous aerobic without signs/symptoms of physical distress      Intensity   THRR 40-80% of Max Heartrate 108-132    Ratings of Perceived Exertion 11-13    Perceived Dyspnea 0-4      Progression   Progression Continue to progress workloads to maintain intensity without signs/symptoms of physical distress.      Resistance Training   Training Prescription Yes    Weight 5 lb    Reps 10-15             Perform Capillary Blood Glucose checks as needed.  Exercise Prescription Changes:   Exercise Prescription Changes     Row Name 09/05/23 0900             Response to Exercise   Blood Pressure (Admit) 124/66       Blood Pressure  (Exercise) 134/72       Blood Pressure (Exit) 124/56       Heart Rate (Admit) 85 bpm       Heart Rate (Exercise) 117 bpm  134 during weights       Heart Rate (Exit) 113 bpm       Oxygen Saturation (Admit) 96 %       Oxygen Saturation (Exercise) 95 %       Rating of Perceived Exertion (Exercise) 13       Perceived Dyspnea (Exercise) 2       Symptoms SOB, legs fatigued       Comments walk test results                Exercise Comments:   Exercise Goals and Review:   Exercise Goals     Row Name 09/05/23 0938             Exercise Goals   Increase Physical Activity Yes       Intervention Provide advice, education, support and counseling about physical activity/exercise needs.;Develop an individualized exercise prescription for aerobic and resistive training based on initial evaluation findings, risk stratification, comorbidities and participant's personal goals.       Expected Outcomes Short Term: Attend rehab on a regular basis to increase amount of physical activity.;Long  Term: Add in home exercise to make exercise part of routine and to increase amount of physical activity.;Long Term: Exercising regularly at least 3-5 days a week.       Increase Strength and Stamina Yes       Intervention Provide advice, education, support and counseling about physical activity/exercise needs.;Develop an individualized exercise prescription for aerobic and resistive training based on initial evaluation findings, risk stratification, comorbidities and participant's personal goals.       Expected Outcomes Short Term: Increase workloads from initial exercise prescription for resistance, speed, and METs.;Short Term: Perform resistance training exercises routinely during rehab and add in resistance training at home;Long Term: Improve cardiorespiratory fitness, muscular endurance and strength as measured by increased METs and functional capacity ( )       Able to understand and use rate of perceived  exertion (RPE) scale Yes       Intervention Provide education and explanation on how to use RPE scale       Expected Outcomes Short Term: Able to use RPE daily in rehab to express subjective intensity level;Long Term:  Able to use RPE to guide intensity level when exercising independently       Able to understand and use Dyspnea scale Yes       Intervention Provide education and explanation on how to use Dyspnea scale       Expected Outcomes Short Term: Able to use Dyspnea scale daily in rehab to express subjective sense of shortness of breath during exertion;Long Term: Able to use Dyspnea scale to guide intensity level when exercising independently       Knowledge and understanding of Target Heart Rate Range (THRR) Yes       Intervention Provide education and explanation of THRR including how the numbers were predicted and where they are located for reference       Expected Outcomes Short Term: Able to state/look up THRR;Long Term: Able to use THRR to govern intensity when exercising independently;Short Term: Able to use daily as guideline for intensity in rehab       Able to check pulse independently Yes       Intervention Provide education and demonstration on how to check pulse in carotid and radial arteries.;Review the importance of being able to check your own pulse for safety during independent exercise       Expected Outcomes Short Term: Able to explain why pulse checking is important during independent exercise;Long Term: Able to check pulse independently and accurately       Understanding of Exercise Prescription Yes       Intervention Provide education, explanation, and written materials on patient's individual exercise prescription       Expected Outcomes Short Term: Able to explain program exercise prescription;Long Term: Able to explain home exercise prescription to exercise independently                Exercise Goals Re-Evaluation :    Discharge Exercise Prescription (Final  Exercise Prescription Changes):  Exercise Prescription Changes - 09/05/23 0900       Response to Exercise   Blood Pressure (Admit) 124/66    Blood Pressure (Exercise) 134/72    Blood Pressure (Exit) 124/56    Heart Rate (Admit) 85 bpm    Heart Rate (Exercise) 117 bpm   134 during weights   Heart Rate (Exit) 113 bpm    Oxygen Saturation (Admit) 96 %    Oxygen Saturation (Exercise) 95 %    Rating of Perceived Exertion (  Exercise) 13    Perceived Dyspnea (Exercise) 2    Symptoms SOB, legs fatigued    Comments walk test results             Nutrition:  Target Goals: Understanding of nutrition guidelines, daily intake of sodium 1500mg , cholesterol 200mg , calories 30% from fat and 7% or less from saturated fats, daily to have 5 or more servings of fruits and vegetables.  Biometrics:  Pre Biometrics - 09/05/23 0938       Pre Biometrics   Height 5\' 7"  (1.702 m)    Weight 84.4 kg    Waist Circumference 37 inches    Hip Circumference 39.5 inches    Waist to Hip Ratio 0.94 %    BMI (Calculated) 29.14    Grip Strength 26.4 kg    Single Leg Stand 17.6 seconds              Nutrition Therapy Plan and Nutrition Goals:   Nutrition Assessments:  MEDIFICTS Score Key: >=70 Need to make dietary changes  40-70 Heart Healthy Diet <= 40 Therapeutic Level Cholesterol Diet  Flowsheet Row CARDIAC REHAB PHASE II ORIENTATION from 09/05/2023 in [facility] CARDIAC REHABILITATION  Picture Your Plate Total Score on Admission 63      Picture Your Plate Scores: <16 Unhealthy dietary pattern with much room for improvement. 41-50 Dietary pattern unlikely to meet recommendations for good health and room for improvement. 51-60 More healthful dietary pattern, with some room for improvement.  >60 Healthy dietary pattern, although there may be some specific behaviors that could be improved.    Nutrition Goals Re-Evaluation:   Nutrition Goals Discharge (Final Nutrition Goals  Re-Evaluation):   Psychosocial: Target Goals: Acknowledge presence or absence of significant depression and/or stress, maximize coping skills, provide positive support system. Participant is able to verbalize types and ability to use techniques and skills needed for reducing stress and depression.  Initial Review & Psychosocial Screening:  Initial Psych Review & Screening - 08/30/23 1116       Initial Review   Current issues with History of Depression;Current Psychotropic Meds      Family Dynamics   Good Support System? Yes      Barriers   Psychosocial barriers to participate in program The patient should benefit from training in stress management and relaxation.;There are no identifiable barriers or psychosocial needs.      Screening Interventions   Interventions Encouraged to exercise;To provide support and resources with identified psychosocial needs;Provide feedback about the scores to participant    Expected Outcomes Short Term goal: Utilizing psychosocial counselor, staff and physician to assist with identification of specific Stressors or current issues interfering with healing process. Setting desired goal for each stressor or current issue identified.;Long Term Goal: Stressors or current issues are controlled or eliminated.;Short Term goal: Identification and review with participant of any Quality of Life or Depression concerns found by scoring the questionnaire.;Long Term goal: The participant improves quality of Life and PHQ9 Scores as seen by post scores and/or verbalization of changes             Quality of Life Scores:  Quality of Life - 08/30/23 1114       Quality of Life   Select Quality of Life      Quality of Life Scores   Health/Function Pre 18.6 %    Socioeconomic Pre 24.75 %    Psych/Spiritual Pre 28.29 %    Family Pre 30 %    GLOBAL Pre 23.57 %  Scores of 19 and below usually indicate a poorer quality of life in these areas.  A  difference of  2-3 points is a clinically meaningful difference.  A difference of 2-3 points in the total score of the Quality of Life Index has been associated with significant improvement in overall quality of life, self-image, physical symptoms, and general health in studies assessing change in quality of life.  PHQ-9: Review Flowsheet       09/05/2023  Depression screen PHQ 2/9  Decreased Interest 0  Down, Depressed, Hopeless 0  PHQ - 2 Score 0  Altered sleeping 0  Tired, decreased energy 3  Change in appetite 1  Feeling bad or failure about yourself  0  Trouble concentrating 0  Moving slowly or fidgety/restless 1  Suicidal thoughts 0  PHQ-9 Score 5  Difficult doing work/chores Somewhat difficult    Details           Interpretation of Total Score  Total Score Depression Severity:  1-4 = Minimal depression, 5-9 = Mild depression, 10-14 = Moderate depression, 15-19 = Moderately severe depression, 20-27 = Severe depression   Psychosocial Evaluation and Intervention:  Psychosocial Evaluation - 08/30/23 1116       Psychosocial Evaluation & Interventions   Interventions Stress management education;Relaxation education;Encouraged to exercise with the program and follow exercise prescription    Comments Patient was referred to CR with Stent Placement and PTCA from [facility]. He does have a history of depression and is currently taking Buspar and Lexapro. He says he does not have any depression or anxiety presently. He says he sleeps well.  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 His main stressor currently is not able to take care of his farm since his stent but his son is helping out until he is able. He lives with his wife of many years who is his main support along with his 2 sons and a grandson. His main issue before and since his stent is SOB. He complained about this at his follow up cardiology visit and he has been  having some hypotensive episodes. He has chronic A-fib. They did make some medication adjustments for the hypotension. His main goal for the program is to improve his SOB and energy and to be able to get back to doing his farming and living a normal life again. He has no barriers identiifed to complete the program.    Expected Outcomes Short Term: Start the program and attend consistently. Long Term: meet his perosnal goals.    Continue Psychosocial Services  Follow up required by staff             Psychosocial Re-Evaluation:   Psychosocial Discharge (Final Psychosocial Re-Evaluation):   Vocational Rehabilitation: Provide vocational rehab assistance to qualifying candidates.   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             Education: Education Goals: Education classes will be provided on a weekly basis, covering required topics. Participant will state understanding/return demonstration of topics presented.  Learning Barriers/Preferences:  Learning Barriers/Preferences - 08/30/23 1108       Learning Barriers/Preferences   Learning Barriers None    Learning Preferences  Audio;Written Material;Skilled Demonstration             Education Topics: Hypertension, Hypertension Reduction -Define heart disease and high blood pressure. Discus how high blood pressure affects the body and ways to reduce high blood pressure.   Exercise and Your Heart -Discuss why it is important to exercise, the FITT principles of exercise, normal and abnormal responses to exercise, and how to exercise safely.   Angina -Discuss definition of angina, causes of angina, treatment of angina, and how to decrease risk of having angina.   Cardiac  Medications -Review what the following cardiac medications are used for, how they affect the body, and side effects that may occur when taking the medications.  Medications include Aspirin, Beta blockers, calcium channel blockers, ACE Inhibitors, angiotensin receptor blockers, diuretics, digoxin, and antihyperlipidemics.   Congestive Heart Failure -Discuss the definition of CHF, how to live with CHF, the signs and symptoms of CHF, and how keep track of weight and sodium intake.   Heart Disease and Intimacy -Discus the effect sexual activity has on the heart, how changes occur during intimacy as we age, and safety during sexual activity.   Smoking Cessation / COPD -Discuss different methods to quit smoking, the health benefits of quitting smoking, and the definition of COPD.   Nutrition I: Fats -Discuss the types of cholesterol, what cholesterol does to the heart, and how cholesterol levels can be controlled.   Nutrition II: Labels -Discuss the different components of food labels and how to read food label   Heart Parts/Heart Disease and PAD -Discuss the anatomy of the heart, the pathway of blood circulation through the heart, and these are affected by heart disease.   Stress I: Signs and Symptoms -Discuss the causes of stress, how stress may lead to anxiety and depression, and ways to limit stress.   Stress II: Relaxation -Discuss different types of relaxation techniques to limit stress.   Warning Signs of Stroke / TIA -Discuss definition of a stroke, what the signs and symptoms are of a stroke, and how to identify when someone is having stroke.   Knowledge Questionnaire Score:  Knowledge Questionnaire Score - 08/30/23 1111       Knowledge Questionnaire Score   Pre Score 24/26             Core Components/Risk Factors/Patient Goals at Admission:  Personal Goals and Risk Factors at Admission - 09/05/23 0939       Core Components/Risk Factors/Patient Goals on  Admission    Weight Management Yes;Weight Loss;Weight Maintenance    Intervention Weight Management: Develop a combined nutrition and exercise program designed to reach desired caloric intake, while maintaining appropriate intake of nutrient and fiber, sodium and fats, and appropriate energy expenditure required for the weight goal.;Weight Management: Provide education and appropriate resources to help participant work on and attain dietary goals.;Weight Management/Obesity: Establish reasonable short term and long term weight goals.    Admit Weight 186 lb 1.6 oz (84.4 kg)    Goal Weight: Short Term 183 lb (83 kg)    Goal Weight: Long Term 183 lb (83 kg)    Expected Outcomes Short Term: Continue to assess and modify interventions until short term weight is achieved;Long Term: Adherence to nutrition and physical activity/exercise program aimed toward attainment of established weight goal;Weight Maintenance: Understanding of the daily nutrition guidelines, which includes 25-35% calories from fat, 7% or less cal from saturated fats, less than 200mg  cholesterol, less than 1.5gm of sodium, & 5 or more servings  of fruits and vegetables daily;Weight Loss: Understanding of general recommendations for a balanced deficit meal plan, which promotes 1-2 lb weight loss per week and includes a negative energy balance of [phone] kcal/d;Understanding recommendations for meals to include 15-35% energy as protein, 25-35% energy from fat, 35-60% energy from carbohydrates, less than 200mg  of dietary cholesterol, 20-35 gm of total fiber daily;Understanding of distribution of calorie intake throughout the day with the consumption of 4-5 meals/snacks    Improve shortness of breath with ADL's Yes    Intervention Provide education, individualized exercise plan and daily activity instruction to help decrease symptoms of SOB with activities of daily living.    Expected Outcomes Short Term: Improve cardiorespiratory fitness to achieve  a reduction of symptoms when performing ADLs;Long Term: Be able to perform more ADLs without symptoms or delay the onset of symptoms    Heart Failure Yes    Intervention Provide a combined exercise and nutrition program that is supplemented with education, support and counseling about heart failure. Directed toward relieving symptoms such as shortness of breath, decreased exercise tolerance, and extremity edema.    Expected Outcomes Improve functional capacity of life;Short term: Attendance in program 2-3 days a week with increased exercise capacity. Reported lower sodium intake. Reported increased fruit and vegetable intake. Reports medication compliance.;Short term: Daily weights obtained and reported for increase. Utilizing diuretic protocols set by physician.;Long term: Adoption of self-care skills and reduction of barriers for early signs and symptoms recognition and intervention leading to self-care maintenance.    Hypertension Yes    Intervention Provide education on lifestyle modifcations including regular physical activity/exercise, weight management, moderate sodium restriction and increased consumption of fresh fruit, vegetables, and low fat dairy, alcohol moderation, and smoking cessation.;Monitor prescription use compliance.    Expected Outcomes Short Term: Continued assessment and intervention until BP is < 140/87mm HG in hypertensive participants. < 130/24mm HG in hypertensive participants with diabetes, heart failure or chronic kidney disease.;Long Term: Maintenance of blood pressure at goal levels.    Lipids Yes    Intervention Provide education and support for participant on nutrition & aerobic/resistive exercise along with prescribed medications to achieve LDL 70mg , HDL >40mg .    Expected Outcomes Short Term: Participant states understanding of desired cholesterol values and is compliant with medications prescribed. Participant is following exercise prescription and nutrition  guidelines.;Long Term: Cholesterol controlled with medications as prescribed, with individualized exercise RX and with personalized nutrition plan. Value goals: LDL < 70mg , HDL > 40 mg.             Core Components/Risk Factors/Patient Goals Review:    Core Components/Risk Factors/Patient Goals at Discharge (Final Review):    ITP Comments:  ITP Comments     Row Name 09/05/23 0934           ITP Comments Patient attend orientation today.  Patient is attendingCardiac Rehabilitation Program.  Documentation for diagnosis can be found in Office Visit notes in media tab for Cardiac Rehab.  Reviewed medical chart, RPE/RPD, gym safety, and program guidelines.  Patient was fitted to equipment they will be using during rehab.  Patient is scheduled to start exercise on Friday 09/08/23 at 1100.   Initial ITP created and sent for review and signature by Dr. Dina Rich, Medical Director for Cardiac Rehabilitation Program.                Comments: Initial ITP

## 2023-09-08 ENCOUNTER — Encounter (HOSPITAL_COMMUNITY)
Admission: RE | Admit: 2023-09-08 | Discharge: 2023-09-08 | Disposition: A | Discharge status: Home / Self Care | Payer: Medicare PPO | Source: Ambulatory Visit | Attending: Internal Medicine | Admitting: Internal Medicine

## 2023-09-08 DIAGNOSIS — Z955 Presence of coronary angioplasty implant and graft: Secondary | ICD-10-CM

## 2023-09-08 DIAGNOSIS — Z9861 Coronary angioplasty status: Secondary | ICD-10-CM

## 2023-09-08 NOTE — Progress Notes (Signed)
Daily Session Note  Patient Details  Name: Jorge West MRN: [id number] Date of Birth: [birth year] Referring Provider:   Flowsheet Row CARDIAC REHAB PHASE II ORIENTATION from 09/05/2023 in [facility] CARDIAC REHABILITATION  Referring Provider Lucrezia Starch MD       Encounter Date: 09/08/2023  Check In:  Session Check In - 09/08/23 1045       Check-In   Supervising physician immediately available to respond to emergencies See telemetry face sheet for immediately available MD    Location [facility]    Staff Present Avanell Shackleton BSN, RN;[place]ica [place], [place], RCEP, CCRP, CCET    Virtual Visit No    Medication changes reported     No    Fall or balance concerns reported    No    Tobacco Cessation No Change    Warm-up and Cool-down Performed on first and last piece of equipment    Resistance Training Performed Yes    VAD Patient? No    PAD/SET Patient? No      Pain Assessment   Currently in Pain? No/denies    Multiple Pain Sites No             Capillary Blood Glucose: No results found for this or any previous visit (from the past 24 hour(s)).    Social History   Tobacco Use  Smoking Status Never  Smokeless Tobacco Former  Tobacco Comments   10-15 yeras quit     Goals Met:  Independence with exercise equipment Exercise tolerated well No report of concerns or symptoms today Strength training completed today  Goals Unmet:  Not Applicable  Comments: Marland KitchenMarland KitchenFirst full day of exercise!  Patient was oriented to gym and equipment including functions, settings, policies, and procedures.  Patient's individual exercise prescription and treatment plan were reviewed.  All starting workloads were established based on the results of the 6 minute walk test done at initial orientation visit.  The plan for exercise progression was also introduced and progression will be customized based on patient's performance and goals.

## 2023-09-11 ENCOUNTER — Encounter (HOSPITAL_COMMUNITY)
Admission: RE | Admit: 2023-09-11 | Discharge: 2023-09-11 | Disposition: A | Discharge status: Home / Self Care | Payer: Medicare PPO | Source: Ambulatory Visit | Attending: Internal Medicine | Admitting: Internal Medicine

## 2023-09-11 DIAGNOSIS — Z955 Presence of coronary angioplasty implant and graft: Secondary | ICD-10-CM

## 2023-09-11 DIAGNOSIS — Z9861 Coronary angioplasty status: Secondary | ICD-10-CM

## 2023-09-11 NOTE — Progress Notes (Signed)
Daily Session Note  Patient Details  Name: Jorge West MRN: [id number] Date of Birth: Oct 22, 1946 Referring Provider:   Flowsheet Row CARDIAC REHAB PHASE II ORIENTATION from 09/05/2023 in [facility] CARDIAC REHABILITATION  Referring Provider Lucrezia Starch MD       Encounter Date: 09/11/2023  Check In:  Session Check In - 09/11/23 1102       Check-In   Supervising physician immediately available to respond to emergencies See telemetry face sheet for immediately available ER MD    Location [facility]    Staff Present Ross Ludwig, BS, Exercise Physiologist;Lissett Favorite Juanetta Gosling, MA, RCEP, CCRP, CCET;Phyllis Billingsley, RN    Virtual Visit No    Medication changes reported     No    Fall or balance concerns reported    No    Warm-up and Cool-down Performed on first and last piece of equipment    Resistance Training Performed Yes    VAD Patient? No    PAD/SET Patient? No      Pain Assessment   Currently in Pain? No/denies             Capillary Blood Glucose: No results found for this or any previous visit (from the past 24 hour(s)).    Social History   Tobacco Use  Smoking Status Never  Smokeless Tobacco Former  Tobacco Comments   10-15 yeras quit     Goals Met:  Independence with exercise equipment Exercise tolerated well No report of concerns or symptoms today Strength training completed today  Goals Unmet:  Not Applicable  Comments: Pt able to follow exercise prescription today without complaint.  Will continue to monitor for progression.

## 2023-09-13 ENCOUNTER — Encounter (HOSPITAL_COMMUNITY)
Admission: RE | Admit: 2023-09-13 | Discharge: 2023-09-13 | Disposition: A | Discharge status: Home / Self Care | Payer: Medicare PPO | Source: Ambulatory Visit | Attending: Internal Medicine | Admitting: Internal Medicine

## 2023-09-13 DIAGNOSIS — Z955 Presence of coronary angioplasty implant and graft: Secondary | ICD-10-CM | POA: Diagnosis not present

## 2023-09-13 DIAGNOSIS — Z9861 Coronary angioplasty status: Secondary | ICD-10-CM

## 2023-09-13 NOTE — Progress Notes (Signed)
Daily Session Note  Patient Details  Name: Jorge West MRN: [id number] Date of Birth: [birth year] Referring Provider:   Flowsheet Row CARDIAC REHAB PHASE II ORIENTATION from 09/05/2023 in [facility] CARDIAC REHABILITATION  Referring Provider Lucrezia Starch MD       Encounter Date: 09/13/2023  Check In:  Session Check In - 09/13/23 1035       Check-In   Supervising physician immediately available to respond to emergencies See telemetry face sheet for immediately available MD    Location [facility]    Staff Present Fabio Pierce, MA, RCEP, CCRP, CCET;Naithan Delage Fredric Mare, [place], Exercise Physiologist    Virtual Visit No    Medication changes reported     No    Fall or balance concerns reported    No    Tobacco Cessation No Change    Warm-up and Cool-down Performed on first and last piece of equipment    Resistance Training Performed Yes    VAD Patient? No    PAD/SET Patient? No      Pain Assessment   Currently in Pain? No/denies    Multiple Pain Sites No             Capillary Blood Glucose: No results found for this or any previous visit (from the past 24 hour(s)).    Social History   Tobacco Use  Smoking Status Never  Smokeless Tobacco Former  Tobacco Comments   10-15 yeras quit     Goals Met:  Independence with exercise equipment Exercise tolerated well No report of concerns or symptoms today Strength training completed today  Goals Unmet:  Not Applicable  Comments: Pt able to follow exercise prescription today without complaint.  Will continue to monitor for progression.

## 2023-09-15 ENCOUNTER — Encounter (HOSPITAL_COMMUNITY)
Admission: RE | Admit: 2023-09-15 | Discharge: 2023-09-15 | Disposition: A | Discharge status: Home / Self Care | Payer: Medicare PPO | Source: Ambulatory Visit | Attending: Internal Medicine | Admitting: Internal Medicine

## 2023-09-15 DIAGNOSIS — Z955 Presence of coronary angioplasty implant and graft: Secondary | ICD-10-CM | POA: Diagnosis not present

## 2023-09-15 DIAGNOSIS — Z9861 Coronary angioplasty status: Secondary | ICD-10-CM

## 2023-09-15 NOTE — Progress Notes (Signed)
Daily Session Note  Patient Details  Name: SAMVIT PEOT MRN: [id number] Date of Birth: 11-11-1946 Referring Provider:   Flowsheet Row CARDIAC REHAB PHASE II ORIENTATION from 09/05/2023 in [facility] CARDIAC REHABILITATION  Referring Provider Lucrezia Starch MD       Encounter Date: 09/15/2023  Check In:  Session Check In - 09/15/23 1045       Check-In   Supervising physician immediately available to respond to emergencies See telemetry face sheet for immediately available MD    Location [facility]    Staff Present Avanell Shackleton BSN, RN;Jessica [place], [place], RCEP, CCRP, CCET    Virtual Visit No    Medication changes reported     No    Fall or balance concerns reported    No    Tobacco Cessation No Change    Warm-up and Cool-down Performed on first and last piece of equipment    Resistance Training Performed Yes    VAD Patient? No    PAD/SET Patient? No      Pain Assessment   Currently in Pain? No/denies    Multiple Pain Sites No             Capillary Blood Glucose: No results found for this or any previous visit (from the past 24 hour(s)).    Social History   Tobacco Use  Smoking Status Never  Smokeless Tobacco Former  Tobacco Comments   10-15 yeras quit     Goals Met:  Independence with exercise equipment Exercise tolerated well No report of concerns or symptoms today Strength training completed today  Goals Unmet:  Not Applicable  Comments: Marland KitchenMarland KitchenPt able to follow exercise prescription today without complaint.  Will continue to monitor for progression.

## 2023-09-18 ENCOUNTER — Encounter (HOSPITAL_COMMUNITY)
Admission: RE | Admit: 2023-09-18 | Discharge: 2023-09-18 | Disposition: A | Discharge status: Home / Self Care | Payer: Medicare PPO | Source: Ambulatory Visit | Attending: Internal Medicine | Admitting: Internal Medicine

## 2023-09-18 DIAGNOSIS — R570 Cardiogenic shock: Secondary | ICD-10-CM | POA: Diagnosis not present

## 2023-09-18 DIAGNOSIS — N170 Acute kidney failure with tubular necrosis: Secondary | ICD-10-CM | POA: Diagnosis not present

## 2023-09-18 DIAGNOSIS — Z955 Presence of coronary angioplasty implant and graft: Secondary | ICD-10-CM

## 2023-09-18 DIAGNOSIS — Z9861 Coronary angioplasty status: Secondary | ICD-10-CM

## 2023-09-18 NOTE — Progress Notes (Signed)
Daily Session Note  Patient Details  Name: Jorge West MRN: [id number] Date of Birth: 06-21-1946 Referring Provider:   Flowsheet Row CARDIAC REHAB PHASE II ORIENTATION from 09/05/2023 in [facility] CARDIAC REHABILITATION  Referring Provider Lucrezia Starch MD       Encounter Date: 09/18/2023  Check In:  Session Check In - 09/18/23 1116       Check-In   Supervising physician immediately available to respond to emergencies See telemetry face sheet for immediately available MD    Location [facility]-Cardiac & Pulmonary Rehab    Staff Present Fabio Pierce, MA, RCEP, CCRP, CCET    Staff Present Cyndia Diver, RN, BSN, MA    Virtual Visit No    Medication changes reported     No    Fall or balance concerns reported    No    Warm-up and Cool-down Performed on first and last piece of equipment    Resistance Training Performed Yes    VAD Patient? No    PAD/SET Patient? No      Pain Assessment   Currently in Pain? No/denies             Capillary Blood Glucose: No results found for this or any previous visit (from the past 24 hour(s)).    Social History   Tobacco Use  Smoking Status Never  Smokeless Tobacco Former  Tobacco Comments   10-15 yeras quit     Goals Met:  Independence with exercise equipment Exercise tolerated well No report of concerns or symptoms today Strength training completed today  Goals Unmet:  Not Applicable  Comments: Pt able to follow exercise prescription today without complaint.  Will continue to monitor for progression.

## 2023-09-19 ENCOUNTER — Emergency Department (HOSPITAL_COMMUNITY): Payer: Medicare PPO

## 2023-09-19 ENCOUNTER — Inpatient Hospital Stay (HOSPITAL_COMMUNITY)
Admission: EM | Admit: 2023-09-19 | Discharge: 2023-10-31 | DRG: 673 | Disposition: A | Discharge status: Skilled Nursing Facility | Payer: Medicare PPO | Attending: Internal Medicine | Admitting: Internal Medicine

## 2023-09-19 DIAGNOSIS — N390 Urinary tract infection, site not specified: Secondary | ICD-10-CM | POA: Diagnosis not present

## 2023-09-19 DIAGNOSIS — M6282 Rhabdomyolysis: Secondary | ICD-10-CM | POA: Diagnosis present

## 2023-09-19 DIAGNOSIS — E440 Moderate protein-calorie malnutrition: Secondary | ICD-10-CM | POA: Diagnosis present

## 2023-09-19 DIAGNOSIS — Z888 Allergy status to other drugs, medicaments and biological substances status: Secondary | ICD-10-CM

## 2023-09-19 DIAGNOSIS — I4821 Permanent atrial fibrillation: Secondary | ICD-10-CM | POA: Diagnosis present

## 2023-09-19 DIAGNOSIS — I4891 Unspecified atrial fibrillation: Secondary | ICD-10-CM

## 2023-09-19 DIAGNOSIS — S7292XA Unspecified fracture of left femur, initial encounter for closed fracture: Secondary | ICD-10-CM

## 2023-09-19 DIAGNOSIS — E861 Hypovolemia: Secondary | ICD-10-CM | POA: Diagnosis present

## 2023-09-19 DIAGNOSIS — S8002XA Contusion of left knee, initial encounter: Secondary | ICD-10-CM | POA: Diagnosis present

## 2023-09-19 DIAGNOSIS — E872 Acidosis, unspecified: Secondary | ICD-10-CM

## 2023-09-19 DIAGNOSIS — A419 Sepsis, unspecified organism: Secondary | ICD-10-CM | POA: Diagnosis not present

## 2023-09-19 DIAGNOSIS — D631 Anemia in chronic kidney disease: Secondary | ICD-10-CM | POA: Diagnosis present

## 2023-09-19 DIAGNOSIS — L8990 Pressure ulcer of unspecified site, unspecified stage: Secondary | ICD-10-CM | POA: Insufficient documentation

## 2023-09-19 DIAGNOSIS — M23602 Other spontaneous disruption of unspecified ligament of left knee: Secondary | ICD-10-CM | POA: Diagnosis present

## 2023-09-19 DIAGNOSIS — I4819 Other persistent atrial fibrillation: Secondary | ICD-10-CM

## 2023-09-19 DIAGNOSIS — Z87891 Personal history of nicotine dependence: Secondary | ICD-10-CM

## 2023-09-19 DIAGNOSIS — I480 Paroxysmal atrial fibrillation: Secondary | ICD-10-CM | POA: Diagnosis present

## 2023-09-19 DIAGNOSIS — K810 Acute cholecystitis: Secondary | ICD-10-CM | POA: Insufficient documentation

## 2023-09-19 DIAGNOSIS — I5042 Chronic combined systolic (congestive) and diastolic (congestive) heart failure: Secondary | ICD-10-CM

## 2023-09-19 DIAGNOSIS — D638 Anemia in other chronic diseases classified elsewhere: Secondary | ICD-10-CM | POA: Diagnosis present

## 2023-09-19 DIAGNOSIS — I2582 Chronic total occlusion of coronary artery: Secondary | ICD-10-CM | POA: Diagnosis present

## 2023-09-19 DIAGNOSIS — J9601 Acute respiratory failure with hypoxia: Secondary | ICD-10-CM | POA: Diagnosis not present

## 2023-09-19 DIAGNOSIS — S728X2A Other fracture of left femur, initial encounter for closed fracture: Secondary | ICD-10-CM

## 2023-09-19 DIAGNOSIS — Z7902 Long term (current) use of antithrombotics/antiplatelets: Secondary | ICD-10-CM

## 2023-09-19 DIAGNOSIS — S22080G Wedge compression fracture of T11-T12 vertebra, subsequent encounter for fracture with delayed healing: Secondary | ICD-10-CM

## 2023-09-19 DIAGNOSIS — Z713 Dietary counseling and surveillance: Secondary | ICD-10-CM

## 2023-09-19 DIAGNOSIS — R570 Cardiogenic shock: Secondary | ICD-10-CM

## 2023-09-19 DIAGNOSIS — I700 Atherosclerosis of aorta: Secondary | ICD-10-CM | POA: Diagnosis present

## 2023-09-19 DIAGNOSIS — S72432A Displaced fracture of medial condyle of left femur, initial encounter for closed fracture: Secondary | ICD-10-CM | POA: Diagnosis present

## 2023-09-19 DIAGNOSIS — I739 Peripheral vascular disease, unspecified: Secondary | ICD-10-CM | POA: Diagnosis not present

## 2023-09-19 DIAGNOSIS — R34 Anuria and oliguria: Secondary | ICD-10-CM | POA: Diagnosis not present

## 2023-09-19 DIAGNOSIS — I429 Cardiomyopathy, unspecified: Secondary | ICD-10-CM | POA: Diagnosis present

## 2023-09-19 DIAGNOSIS — E860 Dehydration: Secondary | ICD-10-CM | POA: Diagnosis present

## 2023-09-19 DIAGNOSIS — K567 Ileus, unspecified: Secondary | ICD-10-CM | POA: Insufficient documentation

## 2023-09-19 DIAGNOSIS — T508X5A Adverse effect of diagnostic agents, initial encounter: Secondary | ICD-10-CM | POA: Diagnosis not present

## 2023-09-19 DIAGNOSIS — I10 Essential (primary) hypertension: Secondary | ICD-10-CM

## 2023-09-19 DIAGNOSIS — Z85828 Personal history of other malignant neoplasm of skin: Secondary | ICD-10-CM

## 2023-09-19 DIAGNOSIS — S22080A Wedge compression fracture of T11-T12 vertebra, initial encounter for closed fracture: Secondary | ICD-10-CM | POA: Diagnosis present

## 2023-09-19 DIAGNOSIS — B952 Enterococcus as the cause of diseases classified elsewhere: Secondary | ICD-10-CM | POA: Diagnosis not present

## 2023-09-19 DIAGNOSIS — Z860100 Personal history of colon polyps, unspecified: Secondary | ICD-10-CM

## 2023-09-19 DIAGNOSIS — I2489 Other forms of acute ischemic heart disease: Secondary | ICD-10-CM | POA: Diagnosis present

## 2023-09-19 DIAGNOSIS — T796XXA Traumatic ischemia of muscle, initial encounter: Secondary | ICD-10-CM

## 2023-09-19 DIAGNOSIS — D62 Acute posthemorrhagic anemia: Secondary | ICD-10-CM | POA: Diagnosis not present

## 2023-09-19 DIAGNOSIS — F419 Anxiety disorder, unspecified: Secondary | ICD-10-CM | POA: Diagnosis present

## 2023-09-19 DIAGNOSIS — E041 Nontoxic single thyroid nodule: Secondary | ICD-10-CM

## 2023-09-19 DIAGNOSIS — E871 Hypo-osmolality and hyponatremia: Secondary | ICD-10-CM | POA: Diagnosis not present

## 2023-09-19 DIAGNOSIS — S22009A Unspecified fracture of unspecified thoracic vertebra, initial encounter for closed fracture: Secondary | ICD-10-CM

## 2023-09-19 DIAGNOSIS — I130 Hypertensive heart and chronic kidney disease with heart failure and stage 1 through stage 4 chronic kidney disease, or unspecified chronic kidney disease: Secondary | ICD-10-CM | POA: Diagnosis present

## 2023-09-19 DIAGNOSIS — E785 Hyperlipidemia, unspecified: Secondary | ICD-10-CM | POA: Diagnosis present

## 2023-09-19 DIAGNOSIS — N400 Enlarged prostate without lower urinary tract symptoms: Secondary | ICD-10-CM | POA: Diagnosis present

## 2023-09-19 DIAGNOSIS — Z7901 Long term (current) use of anticoagulants: Secondary | ICD-10-CM

## 2023-09-19 DIAGNOSIS — I5040 Unspecified combined systolic (congestive) and diastolic (congestive) heart failure: Secondary | ICD-10-CM

## 2023-09-19 DIAGNOSIS — I959 Hypotension, unspecified: Secondary | ICD-10-CM | POA: Diagnosis not present

## 2023-09-19 DIAGNOSIS — I7121 Aneurysm of the ascending aorta, without rupture: Secondary | ICD-10-CM

## 2023-09-19 DIAGNOSIS — N189 Chronic kidney disease, unspecified: Secondary | ICD-10-CM | POA: Diagnosis present

## 2023-09-19 DIAGNOSIS — F32A Depression, unspecified: Secondary | ICD-10-CM | POA: Diagnosis present

## 2023-09-19 DIAGNOSIS — I9589 Other hypotension: Secondary | ICD-10-CM | POA: Diagnosis present

## 2023-09-19 DIAGNOSIS — E669 Obesity, unspecified: Secondary | ICD-10-CM | POA: Diagnosis present

## 2023-09-19 DIAGNOSIS — G8929 Other chronic pain: Secondary | ICD-10-CM | POA: Diagnosis present

## 2023-09-19 DIAGNOSIS — N1832 Chronic kidney disease, stage 3b: Secondary | ICD-10-CM

## 2023-09-19 DIAGNOSIS — Z955 Presence of coronary angioplasty implant and graft: Secondary | ICD-10-CM

## 2023-09-19 DIAGNOSIS — S82832A Other fracture of upper and lower end of left fibula, initial encounter for closed fracture: Secondary | ICD-10-CM | POA: Diagnosis present

## 2023-09-19 DIAGNOSIS — S8001XA Contusion of right knee, initial encounter: Secondary | ICD-10-CM | POA: Diagnosis present

## 2023-09-19 DIAGNOSIS — N5089 Other specified disorders of the male genital organs: Secondary | ICD-10-CM | POA: Diagnosis not present

## 2023-09-19 DIAGNOSIS — Z79899 Other long term (current) drug therapy: Secondary | ICD-10-CM

## 2023-09-19 DIAGNOSIS — R791 Abnormal coagulation profile: Secondary | ICD-10-CM | POA: Diagnosis present

## 2023-09-19 DIAGNOSIS — S82145A Nondisplaced bicondylar fracture of left tibia, initial encounter for closed fracture: Secondary | ICD-10-CM | POA: Diagnosis present

## 2023-09-19 DIAGNOSIS — N179 Acute kidney failure, unspecified: Secondary | ICD-10-CM

## 2023-09-19 DIAGNOSIS — E876 Hypokalemia: Secondary | ICD-10-CM

## 2023-09-19 DIAGNOSIS — I5033 Acute on chronic diastolic (congestive) heart failure: Secondary | ICD-10-CM | POA: Diagnosis present

## 2023-09-19 DIAGNOSIS — I2510 Atherosclerotic heart disease of native coronary artery without angina pectoris: Secondary | ICD-10-CM | POA: Diagnosis present

## 2023-09-19 DIAGNOSIS — M2392 Unspecified internal derangement of left knee: Secondary | ICD-10-CM | POA: Diagnosis present

## 2023-09-19 DIAGNOSIS — N170 Acute kidney failure with tubular necrosis: Principal | ICD-10-CM | POA: Diagnosis present

## 2023-09-19 DIAGNOSIS — E8809 Other disorders of plasma-protein metabolism, not elsewhere classified: Secondary | ICD-10-CM | POA: Diagnosis present

## 2023-09-19 DIAGNOSIS — Z6831 Body mass index (BMI) 31.0-31.9, adult: Secondary | ICD-10-CM

## 2023-09-19 DIAGNOSIS — K449 Diaphragmatic hernia without obstruction or gangrene: Secondary | ICD-10-CM | POA: Diagnosis present

## 2023-09-19 DIAGNOSIS — L89891 Pressure ulcer of other site, stage 1: Secondary | ICD-10-CM | POA: Diagnosis not present

## 2023-09-19 DIAGNOSIS — I5020 Unspecified systolic (congestive) heart failure: Secondary | ICD-10-CM | POA: Diagnosis not present

## 2023-09-19 DIAGNOSIS — R571 Hypovolemic shock: Secondary | ICD-10-CM | POA: Diagnosis present

## 2023-09-19 DIAGNOSIS — Z8249 Family history of ischemic heart disease and other diseases of the circulatory system: Secondary | ICD-10-CM

## 2023-09-19 DIAGNOSIS — K8000 Calculus of gallbladder with acute cholecystitis without obstruction: Secondary | ICD-10-CM | POA: Diagnosis not present

## 2023-09-19 DIAGNOSIS — M25462 Effusion, left knee: Secondary | ICD-10-CM | POA: Diagnosis present

## 2023-09-19 DIAGNOSIS — D509 Iron deficiency anemia, unspecified: Secondary | ICD-10-CM | POA: Diagnosis present

## 2023-09-19 DIAGNOSIS — Z8616 Personal history of COVID-19: Secondary | ICD-10-CM

## 2023-09-19 DIAGNOSIS — N183 Chronic kidney disease, stage 3 unspecified: Secondary | ICD-10-CM

## 2023-09-19 DIAGNOSIS — W208XXA Other cause of strike by thrown, projected or falling object, initial encounter: Secondary | ICD-10-CM | POA: Diagnosis present

## 2023-09-19 DIAGNOSIS — S8782XA Crushing injury of left lower leg, initial encounter: Principal | ICD-10-CM | POA: Diagnosis present

## 2023-09-19 DIAGNOSIS — R339 Retention of urine, unspecified: Secondary | ICD-10-CM | POA: Diagnosis not present

## 2023-09-19 DIAGNOSIS — N1831 Chronic kidney disease, stage 3a: Secondary | ICD-10-CM

## 2023-09-19 LAB — CBC
HCT: 37.9 % — ABNORMAL LOW (ref 39.0–52.0)
Hemoglobin: 12.4 g/dL — ABNORMAL LOW (ref 13.0–17.0)
MCH: 29.2 pg (ref 26.0–34.0)
MCHC: 32.7 g/dL (ref 30.0–36.0)
MCV: 89.4 fL (ref 80.0–100.0)
Platelets: 232 10*3/uL (ref 150–400)
RBC: 4.24 MIL/uL (ref 4.22–5.81)
RDW: 12.5 % (ref 11.5–15.5)
WBC: 16.1 10*3/uL — ABNORMAL HIGH (ref 4.0–10.5)
nRBC: 0 % (ref 0.0–0.2)

## 2023-09-19 LAB — COMPREHENSIVE METABOLIC PANEL
ALT: 23 U/L (ref 0–44)
AST: 88 U/L — ABNORMAL HIGH (ref 15–41)
Albumin: 2.9 g/dL — ABNORMAL LOW (ref 3.5–5.0)
Alkaline Phosphatase: 44 U/L (ref 38–126)
Anion gap: 13 (ref 5–15)
BUN: 20 mg/dL (ref 8–23)
CO2: 21 mmol/L — ABNORMAL LOW (ref 22–32)
Calcium: 8 mg/dL — ABNORMAL LOW (ref 8.9–10.3)
Chloride: 106 mmol/L (ref 98–111)
Creatinine, Ser: 2.36 mg/dL — ABNORMAL HIGH (ref 0.61–1.24)
GFR, Estimated: 28 mL/min — ABNORMAL LOW (ref >60)
Glucose, Bld: 203 mg/dL — ABNORMAL HIGH (ref 70–99)
Potassium: 3.4 mmol/L — ABNORMAL LOW (ref 3.5–5.1)
Sodium: 140 mmol/L (ref 135–145)
Total Bilirubin: 1.1 mg/dL (ref 0.3–1.2)
Total Protein: 5.8 g/dL — ABNORMAL LOW (ref 6.5–8.1)

## 2023-09-19 LAB — I-STAT CHEM 8, ED
BUN: 20 mg/dL (ref 8–23)
Calcium, Ion: 1.02 mmol/L — ABNORMAL LOW (ref 1.15–1.40)
Chloride: 103 mmol/L (ref 98–111)
Creatinine, Ser: 2.3 mg/dL — ABNORMAL HIGH (ref 0.61–1.24)
Glucose, Bld: 196 mg/dL — ABNORMAL HIGH (ref 70–99)
HCT: 36 % — ABNORMAL LOW (ref 39.0–52.0)
Hemoglobin: 12.2 g/dL — ABNORMAL LOW (ref 13.0–17.0)
Potassium: 3.4 mmol/L — ABNORMAL LOW (ref 3.5–5.1)
Sodium: 142 mmol/L (ref 135–145)
TCO2: 21 mmol/L — ABNORMAL LOW (ref 22–32)

## 2023-09-19 LAB — I-STAT CG4 LACTIC ACID, ED
Lactic Acid, Venous: 4.8 mmol/L (ref 0.5–1.9)
Lactic Acid, Venous: 2.6 mmol/L (ref 0.5–1.9)

## 2023-09-19 LAB — CK
Total CK: 7242 U/L — ABNORMAL HIGH (ref 49–397)
Total CK: 10725 U/L — ABNORMAL HIGH (ref 49–397)

## 2023-09-19 LAB — ABO/RH: ABO/RH(D): O POS

## 2023-09-19 LAB — PROTIME-INR
INR: 1.5 — ABNORMAL HIGH (ref 0.8–1.2)
Prothrombin Time: 18.3 s — ABNORMAL HIGH (ref 11.4–15.2)

## 2023-09-19 LAB — ETHANOL: Alcohol, Ethyl (B): <10 mg/dL (ref <10)

## 2023-09-19 MED ORDER — SODIUM CHLORIDE 0.9 % IV SOLN
INTRAVENOUS | Status: AC | PRN
  Administered 2023-09-19: 1000 mL via INTRAVENOUS

## 2023-09-19 MED ORDER — IOHEXOL 350 MG/ML SOLN
100.0000 mL | Freq: Once | INTRAVENOUS | Status: AC | PRN
  Administered 2023-09-19: 100 mL via INTRAVENOUS

## 2023-09-19 MED ORDER — FENTANYL CITRATE PF 50 MCG/ML IJ SOSY
50.0000 ug | PREFILLED_SYRINGE | Freq: Once | INTRAMUSCULAR | Status: AC
  Administered 2023-09-19: 50 ug via INTRAVENOUS
  Filled 2023-09-19: qty 1

## 2023-09-19 MED ORDER — SODIUM CHLORIDE 0.9 % IV BOLUS
250.0000 mL | Freq: Once | INTRAVENOUS | Status: AC
Start: 2023-09-19 — End: 2023-09-19
  Administered 2023-09-19: 250 mL via INTRAVENOUS

## 2023-09-19 MED ORDER — SODIUM CHLORIDE 0.9% IV SOLUTION: Freq: Once | INTRAVENOUS | Status: DC

## 2023-09-19 NOTE — H&P (Incomplete)
History and Physical    Jorge West ZOX:[id number] DOB: 1946/04/04 DOA: 09/19/2023  PCP: Elizabeth Palau, FNP   Patient coming from: Home   Chief Complaint:  Chief Complaint  Patient presents with  . Trauma  . Level 1  . Crush Injury   ED TRIAGE note:  HPI:  Jorge West is a 77 y.o. male with medical history significant of chronic systolic diastolic heart failure EF 40 to 45%, history of CAD status post DES stent 07/2023, cardiomyopathy, proximal mitral fibrillation on Eliquis, essential hypertension, BPH, CKD stage IIIa presented to emergency department via EMS initially as a nontrauma and then was upgraded to a level 1 on arrival due to hypotension.  He was reportedly changing a tire in the car/tire fell on his left leg.  Per EMS report he was trapped for about 2 hours until his family found him and then was brought to [facility] emergency department for evaluation.  Patient notes left knee pain, right knee pain as well as back pain.  Denies loss of consciousness, headache, neck pain, chest pain, abdominal pain or pelvic pain at this time.  He is alert and oriented.  Patient reported he has having hypotension for last few weeks and primary care provider titrating blood pressure regimen.  He also has ongoing dyspnea with minimal exertion.  Part review patient follows cardiology with Dr. Chales Abrahams.  Patient has chronic exertional dyspnea. Patient underwent heart cath 9//2024 status post drug-eluting stent placement currently on dual antiplatelet therapy with aspirin and Plavix for 24-hour followed by Plavix monotherapy and with continue oral anticoagulation. Current home medications include buspirone, Plavix, Eliquis 5 mg twice daily, Entresto once daily, Toprol-XL 25 mg daily, Crestor, nitroglycerin as needed, vitamin D supplement and Tylenol as needed.  ED Course:  At presentation to ED heart rate in between 46 to 156.EKG showing atrial fibrillation heart rate 120. In the ED  patient blood pressure initially was 82/52 which has been gradually has been improved 112/77.  Most recent heart rate is 127. CBC showing low potassium 3.4, low bicarb 21, elevated blood glucose 230, elevated creatinine 2.36, low albumin 2.9 transaminitis.   CBC showing leukocytosis 16, hemoglobin 12.4, hematocrit 37, MCV 89. Elevated CK 7242. Elevated pro time and INR. Elevated lactic acid 4.8.  Chest x-ray unremarkable.  X-ray pelvis unremarkable.  CT angiogram bilateral lower extremities: VASCULAR  Bilateral lower extremities demonstrate symmetric gradual non-opacification with no definite three-vessel runoff likely due to bolus timing as well as a severe atherosclerotic plaque with likely underlying discontinuous occlusions-likely chronic. Patency cannot be confirmed on this scan.  NON-VASCULAR  Left medial femoral condyle avulsion fracture. Suggestive of medial collateral ligament injury. Recommend MRI for further evaluation.  Small left knee joint effusion.  Asymmetric enlargement of the left mid to lower thigh musculature compared to the right. Associated left distal thigh, knee, proximal leg subcutaneus soft tissue edema. No subcutaneus soft tissue emphysema. CT head no acute intracranial abnormality.  No displaced fracture of the cervical spine.  1.9 cm thyroid gland nodule  CT abdomen pelvis finding: IMPRESSION: 1. Age-indeterminate, possibly acute, T12 anterior wedge compression fracture. Correlate with point tenderness to palpation for an acute component. 2. No acute intrathoracic, intra-abdominal, intrapelvic traumatic injury. 3. No acute fracture or traumatic malalignment of the thoracic spine. 4. Small hiatal hernia with esophageal lumen filled with fluid. Correlate clinically and consider enteric tube placement.  Other imaging findings of potential clinical significance:  1. 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 Recommend annual  imaging followup by CTA  or MRA. This recommendation follows 2010 ACCF/AHA/AATS/ACR/ASA/SCA/SCAI/SIR/STS/SVM Guidelines for the Diagnosis and Management of Patients with Thoracic Aortic Disease. Circulation. 2010; 121: Z610-R604. Aortic aneurysm NOS (ICD10-I71.9). 2. Aortic Atherosclerosis (ICD10-I70.0) including four-vessel coronary artery and aortic valve leaflet calcifications-correlate for aortic stenosis. 3. Cardiomegaly.  General Surgery has been evaluated patient in the ED for the femoral fracture.  Per general surgery evaluation patient has ligamentous tear and avulsion fracture of the medial distal femoral epicondyle.  However as patient is A-fib RVR and hypotension concern for cardiogenic shock I recommended ICU admission as well as recommended cardiology as well. ED physician consulted orthopedic surgeon Dr.Swinteck .  They will see patient in the a.m. orthopedic recommended knee immobilizer and nonweightbearing.  ED physician ordered an MRI of the left knee for further evaluation.  Due to soft blood pressure in the ED patient received 250 bolus of NS.  Blood pressure has been improved from 64/42 112/72. Patient also has acute hypoxic respiratory failure initially was on nonrebreather 15 L has been transition to 4 L oxygen maintaining O2 sat 100%. Atrial fibrillation with with elevated heart rate 156 improved to 127 without any intervention.  ED physician reported general surgery recommended consult trauma surgeon, trauma surgeon said patient is unstable for surgery and will see patient in the a.m.  By the meantime trauma surgery recommended consult ICU.  ICU provider said consult cardiology.  Cardiology Dr. Lendell Caprice has been seeing the patient and per Dr. Adela Lank waiting for recommendation.   In the ED patient received treatment include NS bolus to 250 mL and fentanyl 50 mcg.  Immobilizer has been placed.   Hospitalist has been contacted for further evaluation management of femoral fracture, thoracic vertebral  fracture, acute kidney injury, rhabdomyolysis, lactic acidosis, concern for development of cardiogenic shock and acute hypoxic respiratory failure.  Review of Systems:  ROS  Past Medical History:  Diagnosis Date  . Atrial fibrillation [facility])    Had an ablation done  . Atypical mole 06/14/2000   moderate/marked on mid back [facility])  . Atypical mole 11/29/2001   slight/moderate on right shoulder (widershave)  . Atypical mole 11/29/2001   moderate on right lateral abdomen (widershave)  . Atypical mole 11/29/2001   moderate on right forearm (widershave)  . Atypical mole 01/11/2007   moderate on lower mid back  . Atypical mole 07/14/2008   SK and atypical solar lentigo on left jawline (widershave)  . Basal cell carcinoma 07/29/1996   back left lower ear - CX3+excision  . Basal cell carcinoma 09/22/2004   basosquamous on right tip of nose (MOHs)  . Basal cell carcinoma 10/20/2005   superficial on upper center back - CX3+5FU  . Basal cell carcinoma 10/20/2011   right upper back - tx p bx  . Basal cell carcinoma 02/07/2018   superficial/nodular on left upper arm - CX3+cautery+5FU  . Basal cell carcinoma 10/23/2018   infiltrative on left sideburn [facility])  . Benign prostate hyperplasia   . Cataract    left and removed  . Depression   . Depression   . Diverticulosis   . Erectile dysfunction   . Hyperlipidemia   . Hypertension   . Impaired fasting glucose   . Nocturia   . Personal history of colonic polyps 04/19/2005  . 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   Left Temporal Scalp (well diff)  . 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   left post auricular - clear at visit on 12/28/2001  . Squamous  cell carcinoma of skin 10/08/2003   well differentiated below outer left eye - MOHs  . Squamous cell carcinoma of skin 06/02/2008   well differentiated behind left ear  . Squamous cell carcinoma of skin 10/20/2011   KA on left elbow  . Squamous cell carcinoma of  skin 03/02/2015   well differentiated on right outer cheek - CX3+excision  . Squamous cell carcinoma of skin 03/01/2017   well differentiated on left forearm - tx p bx  . Squamous cell carcinoma of skin 03/07/2018   moderately differentiated on left jawline - CX3+excision    Past Surgical History:  Procedure Laterality Date  . CATARACT EXTRACTION    . COLONOSCOPY    . OPEN REDUCTION INTERNAL FIXATION (ORIF) METACARPAL  09/24/2012   Procedure: OPEN REDUCTION INTERNAL FIXATION (ORIF) METACARPAL;  Surgeon: Sharma Covert, MD;  Location: [facility] OR;  Service: Orthopedics;  Laterality: Left;  and proximal phalanges.  . surgery for skin cancer       reports that he has never smoked. He has quit using smokeless tobacco. He reports that he does not currently use alcohol. He reports that he does not use drugs.  Allergies  Allergen Reactions  . Terazosin Hcl     Dizziness and hypotension    Family History  Problem Relation Age of Onset  . Cancer Other        family history   . Colon cancer Neg Hx   . Colon polyps Neg Hx   . Rectal cancer Neg Hx   . Stomach cancer Neg Hx     Prior to Admission medications   Medication Sig Start Date End Date Taking? Authorizing Provider  acetaminophen (TYLENOL) 325 MG tablet Take 2 tablets (650 mg total) by mouth every 6 (six) hours as needed for mild pain (or Fever >/= 101). 06/03/19   Emokpae, Courage, MD  ALPRAZolam Prudy Feeler) 0.5 MG tablet Take 0.5 mg by mouth 2 (two) times daily as needed for anxiety.    [provider]  amLODipine (NORVASC) 5 MG tablet Take 10 mg by mouth daily. Patient not taking: Reported on 08/30/2023 03/03/20   [provider]  apixaban (ELIQUIS) 5 MG TABS tablet Take 1 tablet (5 mg total) by mouth 2 (two) times daily. Blood thinner for stroke prevention 06/03/19   Shon Hale, MD  brimonidine-timolol (COMBIGAN) 0.2-0.5 % ophthalmic solution Place 1 drop into the left eye daily. 02/29/16   [provider]   buPROPion (WELLBUTRIN XL) 300 MG 24 hr tablet Take 300 mg by mouth daily.    [provider]  Cholecalciferol (VITAMIN D3) 3000 UNITS TABS Take 1 tablet by mouth daily.     [provider]  clopidogrel (PLAVIX) 75 MG tablet Take 75 mg by mouth daily.    [provider]  cyanocobalamin (VITAMIN B12) 100 MCG tablet Take 100 mcg by mouth daily.    [provider]  escitalopram (LEXAPRO) 10 MG tablet Take 10 mg by mouth daily.    [provider]  methocarbamol (ROBAXIN) 500 MG tablet Take 1 tablet (500 mg total) by mouth 4 (four) times daily. 01/31/21   Elson Areas, PA-C  metoprolol tartrate (LOPRESSOR) 25 MG tablet Take 25 mg by mouth daily.    [provider]  nitroGLYCERIN (NITROSTAT) 0.4 MG SL tablet Place 0.4 mg under the tongue every 5 (five) minutes as needed for chest pain.    [provider]  Omega-3 Fatty Acids (FISH OIL PO) Take 1 capsule by  mouth daily.     [provider]  pantoprazole (PROTONIX) 40 MG tablet Take 40 mg by mouth daily.    [provider]  pravastatin (PRAVACHOL) 20 MG tablet Take 20 mg by mouth daily.  Patient not taking: Reported on 08/30/2023    [provider]  rosuvastatin (CRESTOR) 40 MG tablet Take 40 mg by mouth daily.    [provider]  sacubitril-valsartan (ENTRESTO) 24-26 MG Take 1 tablet by mouth daily.    [provider]  vitamin C (ASCORBIC ACID) 500 MG tablet Take 1 tablet (500 mg total) by mouth daily. 09/25/12   Bradly Bienenstock, MD     Physical Exam: Vitals:   09/19/23 2215 09/19/23 2220 09/19/23 2226 09/19/23 2230  BP: 90/68 108/87 105/89 112/77  Pulse: (!) 135 (!) 132 (!) 156 (!) 127  Resp: (!) 22 18 (!) 26 (!) 25  Temp:      TempSrc:      SpO2: 99% 100% 100% 100%  Weight:      Height:        Physical Exam   Labs on Admission: I have personally reviewed following labs and imaging studies  CBC: Recent Labs  Lab 09/19/23 1912  09/19/23 1918  WBC 16.1*  --   HGB 12.4* 12.2*  HCT 37.9* 36.0*  MCV 89.4  --   PLT 232  --    Basic Metabolic Panel: Recent Labs  Lab 09/19/23 1912 09/19/23 1918  NA 140 142  K 3.4* 3.4*  CL 106 103  CO2 21*  --   GLUCOSE 203* 196*  BUN 20 20  CREATININE 2.36* 2.30*  CALCIUM 8.0*  --    GFR: Estimated Creatinine Clearance: 27.8 mL/min (A) (by C-G formula based on SCr of 2.3 mg/dL (H)). Liver Function Tests: Recent Labs  Lab 09/19/23 1912  AST 88*  ALT 23  ALKPHOS 44  BILITOT 1.1  PROT 5.8*  ALBUMIN 2.9*   No results for input(s): "LIPASE", "AMYLASE" in the last 168 hours. No results for input(s): "AMMONIA" in the last 168 hours. Coagulation Profile: Recent Labs  Lab 09/19/23 1912  INR 1.5*   Cardiac Enzymes: Recent Labs  Lab 09/19/23 1912 09/19/23 2229  CKTOTAL 7,242* 10,725*   BNP (last 3 results) No results for input(s): "BNP" in the last 8760 hours. HbA1C: No results for input(s): "HGBA1C" in the last 72 hours. CBG: No results for input(s): "GLUCAP" in the last 168 hours. Lipid Profile: No results for input(s): "CHOL", "HDL", "LDLCALC", "TRIG", "CHOLHDL", "LDLDIRECT" in the last 72 hours. Thyroid Function Tests: No results for input(s): "TSH", "T4TOTAL", "FREET4", "T3FREE", "THYROIDAB" in the last 72 hours. Anemia Panel: No results for input(s): "VITAMINB12", "FOLATE", "FERRITIN", "TIBC", "IRON", "RETICCTPCT" in the last 72 hours. Urine analysis:    Component Value Date/Time   COLORURINE YELLOW 10/23/2019 1956   APPEARANCEUR CLEAR 10/23/2019 1956   LABSPEC 1.013 10/23/2019 1956   PHURINE 6.0 10/23/2019 1956   GLUCOSEU NEGATIVE 10/23/2019 1956   HGBUR MODERATE (A) 10/23/2019 1956   BILIRUBINUR NEGATIVE 10/23/2019 1956   KETONESUR NEGATIVE 10/23/2019 1956   PROTEINUR 30 (A) 10/23/2019 1956   NITRITE NEGATIVE 10/23/2019 1956   LEUKOCYTESUR NEGATIVE 10/23/2019 1956    Radiological Exams on Admission: I have personally reviewed images CT  HEAD WO CONTRAST ( )  Result Date: 09/19/2023 CLINICAL DATA:  Head trauma, minor (Age >= 65y); Neck trauma (Age >= 65y) EXAM: CT HEAD WITHOUT CONTRAST CT CERVICAL SPINE WITHOUT CONTRAST TECHNIQUE: Multidetector CT imaging of the  head and cervical spine was performed following the standard protocol without intravenous contrast. Multiplanar CT image reconstructions of the cervical spine were also generated. RADIATION DOSE REDUCTION: This exam was performed according to the departmental dose-optimization program which includes automated exposure control, adjustment of the mA and/or kV according to patient size and/or use of iterative reconstruction technique. COMPARISON:  None Available. FINDINGS: CT HEAD FINDINGS Brain: No evidence of large-territorial acute infarction. No parenchymal hemorrhage. No mass lesion. No extra-axial collection. No mass effect or midline shift. No hydrocephalus. Basilar cisterns are patent. Vascular: No hyperdense vessel. Skull: No acute fracture or focal lesion. Sinuses/Orbits: Paranasal sinuses and mastoid air cells are clear. The orbits are unremarkable. Other: None. CT CERVICAL SPINE FINDINGS Alignment: Straightening of the normal cervical lordosis likely due to positioning and degenerative changes. Skull base and vertebrae: Multilevel moderate degenerative changes spine. No severe osseous neural foraminal or central canal stenosis. No acute fracture. No aggressive appearing focal osseous lesion or focal pathologic process. Soft tissues and spinal canal: No prevertebral fluid or swelling. No visible canal hematoma. Upper chest: Unremarkable. Other: 1.9 x 1 cm right thyroid gland hypodense nodule. IMPRESSION: 1. No acute intracranial abnormality. 2. No acute displaced fracture or traumatic listhesis of the cervical spine. 3. A 1.9 cm right thyroid gland nodule. Recommend thyroid US (ref: J Am Coll Radiol. 2015 Feb;12(2): 143-50). These results were called by telephone at the time of  interpretation on 09/19/2023 at 8:04 pm to provider [facility] , who verbally acknowledged these results. Electronically Signed   By: Tish Frederickson M.D.   On: 09/19/2023 20:43   CT CERVICAL SPINE WO CONTRAST  Result Date: 09/19/2023 CLINICAL DATA:  Head trauma, minor (Age >= 65y); Neck trauma (Age >= 65y) EXAM: CT HEAD WITHOUT CONTRAST CT CERVICAL SPINE WITHOUT CONTRAST TECHNIQUE: Multidetector CT imaging of the head and cervical spine was performed following the standard protocol without intravenous contrast. Multiplanar CT image reconstructions of the cervical spine were also generated. RADIATION DOSE REDUCTION: This exam was performed according to the departmental dose-optimization program which includes automated exposure control, adjustment of the mA and/or kV according to patient size and/or use of iterative reconstruction technique. COMPARISON:  None Available. FINDINGS: CT HEAD FINDINGS Brain: No evidence of large-territorial acute infarction. No parenchymal hemorrhage. No mass lesion. No extra-axial collection. No mass effect or midline shift. No hydrocephalus. Basilar cisterns are patent. Vascular: No hyperdense vessel. Skull: No acute fracture or focal lesion. Sinuses/Orbits: Paranasal sinuses and mastoid air cells are clear. The orbits are unremarkable. Other: None. CT CERVICAL SPINE FINDINGS Alignment: Straightening of the normal cervical lordosis likely due to positioning and degenerative changes. Skull base and vertebrae: Multilevel moderate degenerative changes spine. No severe osseous neural foraminal or central canal stenosis. No acute fracture. No aggressive appearing focal osseous lesion or focal pathologic process. Soft tissues and spinal canal: No prevertebral fluid or swelling. No visible canal hematoma. Upper chest: Unremarkable. Other: 1.9 x 1 cm right thyroid gland hypodense nodule. IMPRESSION: 1. No acute intracranial abnormality. 2. No acute displaced fracture or traumatic  listhesis of the cervical spine. 3. A 1.9 cm right thyroid gland nodule. Recommend thyroid US (ref: J Am Coll Radiol. 2015 Feb;12(2): 143-50). These results were called by telephone at the time of interpretation on 09/19/2023 at 8:04 pm to provider [facility] , who verbally acknowledged these results. Electronically Signed   By: Tish Frederickson M.D.   On: 09/19/2023 20:43   CT ANGIO LOWER EXT BILAT W &/OR WO CONTRAST  Result Date: 09/19/2023 CLINICAL DATA:  Knee trauma, dislocation suspected (Age >= 5y) EXAM: CT ANGIOGRAPHY OF ABDOMINAL AORTA WITH ILIOFEMORAL RUNOFF TECHNIQUE: Multidetector CT imaging of the lower abdomen, pelvis and lower extremities was performed using the standard protocol during bolus administration of intravenous contrast. Multiplanar CT image reconstructions and MIPs were obtained to evaluate the vascular anatomy. RADIATION DOSE REDUCTION: This exam was performed according to the departmental dose-optimization program which includes automated exposure control, adjustment of the mA and/or kV according to patient size and/or use of iterative reconstruction technique. CONTRAST:  OMNIPAQUE IOHEXOL 350 MG/ML SOLN COMPARISON:  None Available. FINDINGS: VASCULAR Lower infrarenal abdominal aorta: Moderate severe atherosclerotic plaque. Normal caliber aorta without aneurysm, dissection, vasculitis or significant stenosis. IMA: Patent without evidence of aneurysm, dissection, vasculitis or significant stenosis. RIGHT Lower Extremity Inflow: Mild atherosclerotic plaque. Common, internal and external iliac arteries are patent without evidence of aneurysm, dissection, vasculitis or significant stenosis. Outflow: Mild atherosclerotic plaque. Common, superficial and profunda femoral arteries and the popliteal artery are patent without evidence of aneurysm, dissection, vasculitis or significant stenosis. Runoff: Gradual non-opacification with no definite three-vessel runoff likely due to bolus  timing as well as a severe atherosclerotic plaque with likely underlying discontinuous occlusions-likely chronic. LEFT Lower Extremity Inflow: Mild atherosclerotic plaque. Common, internal and external iliac arteries are patent without evidence of aneurysm, dissection, vasculitis or significant stenosis. Outflow: Mild atherosclerotic plaque. Common, superficial and profunda femoral arteries and the popliteal artery are patent without evidence of aneurysm, dissection, vasculitis or significant stenosis. Runoff: Gradual non-opacification with no definite three-vessel runoff likely due to bolus timing as well as a severe atherosclerotic plaque with likely underlying discontinuous occlusions-likely chronic. Veins: No obvious venous abnormality within the limitations of this arterial phase study. Review of the MIP images confirms the above findings. NON-VASCULAR Lower chest: Please see separately dictated CT chest 09/19/2023. Other: Please see separately dictated CT abdomen pelvis 09/19/2023 Musculoskeletal: Asymmetric enlargement of the left mid to lower thigh musculature compared to the right. No heterogeneity to suggest underlying abscess or hematoma. Associated left distal thigh, knee, proximal leg subcutaneus soft tissue edema. The edema is more pronounced along the medial and posterior left knee. No subcutaneus soft tissue emphysema. No retained radiopaque foreign body. Left medial femoral condyle avulsion fracture (2:223, 6: 75-79). Small left joint effusion. No other acute fracture of the bones of the lower extremities. No evidence of dislocation of the bones of the bilateral lower extremities. No evidence of severe arthropathy. No aggressive appearing focal bone abnormality. IMPRESSION: VASCULAR Bilateral lower extremities demonstrate symmetric gradual non-opacification with no definite three-vessel runoff likely due to bolus timing as well as a severe atherosclerotic plaque with likely underlying discontinuous  occlusions-likely chronic. Patency cannot be confirmed on this scan. NON-VASCULAR Left medial femoral condyle avulsion fracture. Suggestive of medial collateral ligament injury. Recommend MRI for further evaluation. Small left knee joint effusion. Asymmetric enlargement of the left mid to lower thigh musculature compared to the right. Associated left distal thigh, knee, proximal leg subcutaneus soft tissue edema. No subcutaneus soft tissue emphysema. Please see separately dictated CT chest, abdomen, pelvis. These results were called by telephone at the time of interpretation on 09/19/2023 at 8:04 pm to provider Dr. Fredricka Bonine, who verbally acknowledged these results. Electronically Signed   By: Tish Frederickson M.D.   On: 09/19/2023 20:31   CT CHEST ABDOMEN PELVIS W CONTRAST  Result Date: 09/19/2023 CLINICAL DATA:  Polytrauma, blunt. EXAM: CT CHEST, ABDOMEN, AND PELVIS WITH CONTRAST TECHNIQUE: Multidetector CT imaging  of the chest, abdomen and pelvis was performed following the standard protocol during bolus administration of intravenous contrast. RADIATION DOSE REDUCTION: This exam was performed according to the departmental dose-optimization program which includes automated exposure control, adjustment of the mA and/or kV according to patient size and/or use of iterative reconstruction technique. CONTRAST:  OMNIPAQUE IOHEXOL 350 MG/ML SOLN COMPARISON:  None Available. FINDINGS: CHEST: Cardiovascular: No aortic injury. 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 The descending thoracic aorta is normal in caliber. Aortic valve leaflet calcification. Four-vessel coronary calcification. Moderate atherosclerotic plaque of the aorta. Enlarged heart size. No significant pericardial effusion. The main pulmonary artery measures at upper limits of normal: 3.1 cm. No central or proximal segmental pulmonary embolus. Mediastinum/Nodes: No pneumomediastinum. No mediastinal  hematoma. Esophageal lumen is filled with fluid.  Small hiatal hernia. The thyroid is grossly unremarkable. The central airways are patent. No mediastinal, hilar, or axillary lymphadenopathy. Lungs/Pleura: Bilateral lower lobe atelectasis. No focal consolidation. No pulmonary nodule. No pulmonary mass. No pulmonary contusion or laceration. No pneumatocele formation. No pleural effusion. No pneumothorax. No hemothorax. Musculoskeletal/Chest wall: No chest wall mass. No acute rib or sternal fracture. Age-indeterminate T12 anterior wedge compression fracture with 50% vertebral body height loss. Mild retrolisthesis of L1 on L2. ABDOMEN / PELVIS: Hepatobiliary: Not enlarged. No focal lesion. No laceration or subcapsular hematoma. The gallbladder is otherwise unremarkable with no radio-opaque gallstones. No biliary ductal dilatation. Pancreas: Normal pancreatic contour. No main pancreatic duct dilatation. Spleen: Not enlarged. No focal lesion. No laceration, subcapsular hematoma, or vascular injury. Adrenals/Urinary Tract: No nodularity bilaterally. Bilateral kidneys enhance symmetrically. No hydronephrosis. No contusion, laceration, or subcapsular hematoma. No injury to the vascular structures or collecting systems. No hydroureter. The urinary bladder is unremarkable. On delayed imaging, there is no urothelial wall thickening and there are no filling defects in the opacified portions of the bilateral collecting systems or ureters. Stomach/Bowel: No small or large bowel wall thickening or dilatation. Colonic diverticulosis. The appendix is unremarkable. Vasculature/Lymphatics: Severe atherosclerotic plaque. No abdominal aorta or iliac aneurysm. No active contrast extravasation or pseudoaneurysm. No abdominal, pelvic, inguinal lymphadenopathy. Reproductive: Prostate is unremarkable. Other: No simple free fluid ascites. No pneumoperitoneum. No hemoperitoneum. No mesenteric hematoma identified. No organized fluid collection.  Musculoskeletal: No significant soft tissue hematoma. Small fat containing umbilical hernia. No acute pelvic fracture. No spinal fracture. Ports and Devices: None. IMPRESSION: 1. Age-indeterminate, possibly acute, T12 anterior wedge compression fracture. Correlate with point tenderness to palpation for an acute component. 2. No acute intrathoracic, intra-abdominal, intrapelvic traumatic injury. 3. No acute fracture or traumatic malalignment of the thoracic spine. 4. Small hiatal hernia with esophageal lumen filled with fluid. Correlate clinically and consider enteric tube placement. Other imaging findings of potential clinical significance: 1. 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 Recommend annual imaging followup by CTA or MRA. This recommendation follows 2010 ACCF/AHA/AATS/ACR/ASA/SCA/SCAI/SIR/STS/SVM Guidelines for the Diagnosis and Management of Patients with Thoracic Aortic Disease. Circulation. 2010; 121: W960-A540. Aortic aneurysm NOS (ICD10-I71.9). 2. Aortic Atherosclerosis (ICD10-I70.0) including four-vessel coronary artery and aortic valve leaflet calcifications-correlate for aortic stenosis. 3. Cardiomegaly. These results were called by telephone at the time of interpretation on 09/19/2023 at 8:04 pm to provider [facility] , who verbally acknowledged these results. Electronically Signed   By: Tish Frederickson M.D.   On: 09/19/2023 20:17   DG Pelvis Portable  Result Date: 09/19/2023 CLINICAL DATA:  Trauma deformity/abrasion to left knee after pt had a tire on his knee for  ab an hour per family. EXAM: PORTABLE PELVIS 1-2 VIEWS COMPARISON:  None Available. FINDINGS: There is no evidence of pelvic fracture or diastasis. No acute displaced fracture or dislocation of either hips. No pelvic bone lesions are seen. Degenerative changes of visualized lower lumbar spine. IMPRESSION: Negative for acute traumatic injury. Electronically Signed   By: Tish Frederickson M.D.   On: 09/19/2023 19:49   DG  Chest Port 1 View  Result Date: 09/19/2023 CLINICAL DATA:  Trauma deformity/abrasion to left knee after pt had a tire on his knee for ab an hour per family. EXAM: PORTABLE CHEST 1 VIEW COMPARISON:  Chest x-ray 10/23/2019 FINDINGS: Slightly more prominent cardiomediastinal silhouette likely due rotation. Otherwise the heart and mediastinal contours are unchanged. Atherosclerotic plaque. No focal consolidation. No pulmonary edema. No pleural effusion. No pneumothorax. No acute osseous abnormality. IMPRESSION: 1. No active disease. 2.  Aortic Atherosclerosis (ICD10-I70.0). Electronically Signed   By: Tish Frederickson M.D.   On: 09/19/2023 19:47    EKG: My personal interpretation of EKG shows: .    Assessment/Plan: Active Problems:   Thoracic vertebral fracture (HCC)    Assessment and Plan: No notes have been filed under this hospital service. Service: Hospitalist      DVT prophylaxis:  {Blank single:19197::"Lovenox","SQ Heparin","IV heparin gtts","Xarelto","Eliquis","Coumadin","SCDs","***"} Code Status:  {Blank single:19197::"Full Code","DNR with Intubation","DNR/DNI(Do NOT Intubate)","Comfort Care","***"} Diet:  Family Communication:  *** Family was present at bedside, at the time of interview.  Opportunity was given to ask question and all questions were answered satisfactorily.  Disposition Plan:  ***  Consults:  ***  Admission status:   {Blank single:19197::"Observation","Inpatient"}, {Blank single:19197::"Med-Surg","Telemetry bed","Step Down Unit"}  Severity of Illness: {Observation/Inpatient:21159}    Tereasa Coop, MD [facility]  How to contact the [facility] Attending or Consulting provider 7A - 7P or covering provider during after hours 7P -7A, for this patient.  Check the care team in [facility] and look for a) attending/consulting TR[facility] provider listed and b) the [facility] team listed Log into [email or web address] and use [place]'s universal password to access. If you do not have the  password, please contact the hospital operator. Locate the [facility] provider you are looking for under [facility] and page to a number that you can be directly reached. If you still have difficulty reaching the provider, please page the [facility] (Director on Call) for the Hospitalists listed on amion for assistance.  09/19/2023, 11:55 PM

## 2023-09-19 NOTE — ED Triage Notes (Addendum)
Pt bib [place] [facility] coming from home with deformity/abrasion to left knee after pt had a tire on his knee for ab an hour per family. Family reports to EMS that pt was maybe trying to change tire on a car, tire was on pt leg for ab an hour before he was found by family. When asked if the car crushed his leg, he states "yes but the tire was crushing it." Pt alert and oriented. Pt is in a-fib and is on eliquis. Pt had emesis occurrence on way to room.  EMS vital signs:  1000 NS  90/60 No difficulty breathing 20g Right forearm  98% 3L [place]

## 2023-09-19 NOTE — ED Notes (Signed)
Pt transported to CT on portable monitor with RN.

## 2023-09-19 NOTE — ED Notes (Signed)
Xray tech at bedside at this time

## 2023-09-19 NOTE — H&P (Addendum)
Surgical Evaluation  Chief Complaint: Leg injury  HPI: 77 year old male who presented initially as a nontrauma and then was upgraded to a level 1 on arrival due to hypotension.  He was reportedly changing a tire in the car/tire fell on his left leg.  Per EMS report he was trapped for about 2 hours until his family found him and then was brought to [facility] emergency department for evaluation.  Patient notes left knee pain, right knee pain as well as back pain.  Denies loss of consciousness, headache, neck pain, chest pain, abdominal pain or pelvic pain at this time.  He is alert and oriented.  He did receive a unit of packed red blood cells.  Of note, he has history of heart failure with an ejection fraction of 35 to 40% as of January 2023, significant coronary artery disease with recent declining exertional tolerance over the last year and cardiac cath in September of this year showing multivessel disease requiring complex stenting of the marginal branch and angioplasty, A-fib current status post previous ablation and is on Eliquis and Plavix, CKD 4, hypertension.  He has had issues with hypotension over the last few weeks following his PCI requiring titration of his blood pressure medications.  Has ongoing dyspnea with minimal exertion.  Repeat echo done 2 weeks ago notes EF of 40 to 45% with mild hypokinesis of the left ventricle  Allergies  Allergen Reactions   Terazosin Hcl     Dizziness and hypotension    Past Medical History:  Diagnosis Date   Atrial fibrillation [facility])    Had an ablation done   Atypical mole 06/14/2000   moderate/marked on mid back Nino Glow)   Atypical mole 11/29/2001   slight/moderate on right shoulder Nino Glow)   Atypical mole 11/29/2001   moderate on right lateral abdomen Nino Glow)   Atypical mole 11/29/2001   moderate on right forearm Nino Glow)   Atypical mole 01/11/2007   moderate on lower mid back   Atypical mole 07/14/2008   SK and atypical  solar lentigo on left jawline (widershave)   Basal cell carcinoma 07/29/1996   back left lower ear - CX3+excision   Basal cell carcinoma 09/22/2004   basosquamous on right tip of nose (MOHs)   Basal cell carcinoma 10/20/2005   superficial on upper center back - CX3+5FU   Basal cell carcinoma 10/20/2011   right upper back - tx p bx   Basal cell carcinoma 02/07/2018   superficial/nodular on left upper arm - CX3+cautery+5FU   Basal cell carcinoma 10/23/2018   infiltrative on left [facility])   Benign prostate hyperplasia    Cataract    left and removed   Depression    Depression    Diverticulosis    Erectile dysfunction    Hyperlipidemia    Hypertension    Impaired fasting glucose    Nocturia    Personal history of colonic polyps 04/19/2005   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   Left Temporal Scalp (well diff)   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   left post auricular - clear at visit on 12/28/2001   Squamous cell carcinoma of skin 10/08/2003   well differentiated below outer left eye - MOHs   Squamous cell carcinoma of skin 06/02/2008   well differentiated behind left ear   Squamous cell carcinoma of skin 10/20/2011   KA on left elbow   Squamous cell carcinoma of skin 03/02/2015   well differentiated on right outer cheek - CX3+excision  Squamous cell carcinoma of skin 03/01/2017   well differentiated on left forearm - tx p bx   Squamous cell carcinoma of skin 03/07/2018   moderately differentiated on left jawline - CX3+excision    Past Surgical History:  Procedure Laterality Date   CATARACT EXTRACTION     COLONOSCOPY     OPEN REDUCTION INTERNAL FIXATION (ORIF) METACARPAL  09/24/2012   Procedure: OPEN REDUCTION INTERNAL FIXATION (ORIF) METACARPAL;  Surgeon: Sharma Covert, MD;  Location: [facility] OR;  Service: Orthopedics;  Laterality: Left;  and proximal phalanges.   surgery for skin cancer      Family History  Problem Relation Age of Onset    Cancer Other        family history    Colon cancer Neg Hx    Colon polyps Neg Hx    Rectal cancer Neg Hx    Stomach cancer Neg Hx     Social History   Socioeconomic History   Marital status: Married    Spouse name: Not on file   Number of children: Not on file   Years of education: social deg   Highest education level: Not on file  Occupational History   Not on file  Tobacco Use   Smoking status: Never   Smokeless tobacco: Former   Tobacco comments:    10-15 yeras quit   Vaping Use   Vaping status: Never Used  Substance and Sexual Activity   Alcohol use: Not Currently    Alcohol/week: 0.0 standard drinks of alcohol   Drug use: Never   Sexual activity: Not on file  Other Topics Concern   Not on file  Social History Narrative   Not on file   Social Determinants of Health   Financial Resource Strain: Low Risk  (04/24/2023)   Received from [facility], [facility]   Overall Financial Resource Strain (CARDIA)    Difficulty of Paying Living Expenses: Not hard at all  Food Insecurity: No Food Insecurity (04/24/2023)   Received from [facility], [facility]   Hunger Vital Sign    Worried About Running Out of Food in the Last Year: Never true    Ran Out of Food in the Last Year: Never true  Transportation Needs: No Transportation Needs (04/24/2023)   Received from [employer], [facility]   PRAPARE - Transportation    Lack of Transportation (Medical): No    Lack of Transportation (Non-Medical): No  Physical Activity: Inactive (04/24/2023)   Received from [facility], [facility]   Exercise Vital Sign    Days of Exercise per Week: 0 days    Minutes of Exercise per Session: 20 min  Stress: No Stress Concern Present (07/18/2023)   Received from [facility] - Occupational Stress Questionnaire    Feeling of Stress : Only a little  Social Connections: Moderately Integrated (04/24/2023)   [facility] from [facility],  [facility]   Social Network    How would you rate your social network (family, work, friends)?: Adequate participation with social networks    No current facility-administered medications on file prior to encounter.   Current Outpatient Medications on File Prior to Encounter  Medication Sig Dispense Refill   acetaminophen (TYLENOL) 325 MG tablet Take 2 tablets (650 mg total) by mouth every 6 (six) hours as needed for mild pain (or Fever >/= 101). 12 tablet 0   ALPRAZolam (XANAX) 0.5 MG tablet Take 0.5 mg by mouth 2 (two) times  daily as needed for anxiety.     amLODipine (NORVASC) 5 MG tablet Take 10 mg by mouth daily. (Patient not taking: Reported on 08/30/2023)     apixaban (ELIQUIS) 5 MG TABS tablet Take 1 tablet (5 mg total) by mouth 2 (two) times daily. Blood thinner for stroke prevention 60 tablet 5   brimonidine-timolol (COMBIGAN) 0.2-0.5 % ophthalmic solution Place 1 drop into the left eye daily.     buPROPion (WELLBUTRIN XL) 300 MG 24 hr tablet Take 300 mg by mouth daily.     Cholecalciferol (VITAMIN D3) 3000 UNITS TABS Take 1 tablet by mouth daily.      clopidogrel (PLAVIX) 75 MG tablet Take 75 mg by mouth daily.     cyanocobalamin (VITAMIN B12) 100 MCG tablet Take 100 mcg by mouth daily.     escitalopram (LEXAPRO) 10 MG tablet Take 10 mg by mouth daily.     methocarbamol (ROBAXIN) 500 MG tablet Take 1 tablet (500 mg total) by mouth 4 (four) times daily. 20 tablet 0   metoprolol tartrate (LOPRESSOR) 25 MG tablet Take 25 mg by mouth daily.     nitroGLYCERIN (NITROSTAT) 0.4 MG SL tablet Place 0.4 mg under the tongue every 5 (five) minutes as needed for chest pain.     Omega-3 Fatty Acids (FISH OIL PO) Take 1 capsule by mouth daily.      pantoprazole (PROTONIX) 40 MG tablet Take 40 mg by mouth daily.     pravastatin (PRAVACHOL) 20 MG tablet Take 20 mg by mouth daily.  (Patient not taking: Reported on 08/30/2023)     rosuvastatin (CRESTOR) 40 MG tablet Take 40 mg by mouth daily.      sacubitril-valsartan (ENTRESTO) 24-26 MG Take 1 tablet by mouth daily.     vitamin C (ASCORBIC ACID) 500 MG tablet Take 1 tablet (500 mg total) by mouth daily. 90 tablet 0    Review of Systems: a complete, 10pt review of systems was completed with pertinent positives and negatives as documented in the HPI  Physical Exam: Vitals:   09/19/23 1857 09/19/23 1917  BP: (!) 72/52 (!) 78/40  Pulse: (!) 49   Resp: (!) 22   SpO2: (!) 85%    Gen: A&Ox3, no distress  Eyes: lids and conjunctivae normal, no icterus. Pupils equally round and reactive to light.  Neck: Trachea midline, no crepitus or hematoma; no midline C-spine tenderness Chest: respiratory effort is normal. No crepitus or tenderness on palpation of the chest.   Cardiovascular: A-fib, 140s, palpable radial pulses. He has triphasic PT and DP signals in the left (injured) lower extremity and more faint signals in the right lower extremity Gastrointestinal: soft, nondistended, nontender.  Pelvis is stable and nontender Muscoloskeletal: no clubbing or cyanosis of the fingers.  Strength is symmetrical to bilateral upper extremities.  There is extensive edema to the left knee and thigh with ecchymosis along the anterior medial knee and a superficial abrasion along the anterior knee.  There is ecchymosis without overt hematoma along the right medial knee. Neuro: cranial nerves grossly intact.  Subjective decreased of sensation along the dorsum of the foot Psych: appropriate mood and affect, normal insight/judgment intact  Skin: warm and dry      Latest Ref Rng & Units 09/19/2023    7:18 PM 10/25/2019    5:36 AM 10/24/2019    8:37 AM  CBC  WBC 4.0 - 10.5 K/uL  3.7  3.6   Hemoglobin 13.0 - 17.0 g/dL 40.1  02.7  12.1  Hematocrit 39.0 - 52.0 % 36.0  36.9  36.1   Platelets 150 - 400 K/uL  174  165        Latest Ref Rng & Units 09/19/2023    7:18 PM 10/25/2019    5:36 AM 10/24/2019    8:37 AM  CMP  Glucose 70 - 99 mg/dL 578  90  87    BUN 8 - 23 mg/dL 20  25  24    Creatinine 0.61 - 1.24 mg/dL 4.69  6.29  5.28   Sodium 135 - 145 mmol/L 142  137  135   Potassium 3.5 - 5.1 mmol/L 3.4  3.8  3.2   Chloride 98 - 111 mmol/L 103  101  99   CO2 22 - 32 mmol/L  26  26   Calcium 8.9 - 10.3 mg/dL  8.6  8.4   Total Protein 6.5 - 8.1 g/dL   6.4   Total Bilirubin 0.3 - 1.2 mg/dL   0.6   Alkaline Phos 38 - 126 U/L   52   AST 15 - 41 U/L   26   ALT 0 - 44 U/L   22     Lab Results  Component Value Date   INR 1.0 06/02/2019    Imaging: No results found.   A/P: 77 year old male with recent history of fairly significant cardiac disease, chronic kidney disease, who has sustained an injury to the left knee. I have reviewed the images preliminarily with the radiologist.  His head and C-spine CTs are negative.  He has a likely chronic T12 compression fracture (patient notes chronic back pain), but otherwise nothing acute in the chest, abdomen or pelvis.  He has bilateral lower extremity extensive atherosclerotic plaque and inadequate runoff for complete exam, but no extravasation and doppler signals are actually better in the left/injured leg than the other side.  There is significant edema likely representing a ligamentous or tendinous tear and an avulsion fracture to the medial distal femoral condyle. No active bleeding. Has not had significant improvement in hemodynamics with transfusion (2PRBC+2FFP)  -Afib RVR and hypotension/likely HF exacerbation/ cardiogenic shock -Rhabdomyolysis- ck [id number] -AKI on CKD- baseline Cr 1.6-1.8 over last few months now 2.3 and anticipate this will worsen after contrast load/ evolving rhabdo  -Recommend medical / ICU admission given severe cardiac disease and hypotension. Dr. Adela Lank has contacted PCCM and Cardiology.   -Orthopedic surgery consulted ([place] @ 20:44) regarding the left lower extremity injury, they will see in AM and I will order MRI to further evaluate which can be done when he is more  stable. Knee immobilizer. Non-weight bearing.   Trauma service will follow along.   Patient Active Problem List   Diagnosis Date Noted   COVID-19 virus infection 10/24/2019   AKI (acute kidney injury) (HCC) 10/24/2019   Hypokalemia 10/24/2019   Syncope and collapse 10/24/2019   Syncope 10/23/2019   AF (paroxysmal atrial fibrillation) (HCC) 06/02/2019   HTN (hypertension) 06/02/2019   HLD (hyperlipidemia) 06/02/2019   Chest pain in adult 06/02/2019   Chronic diastolic CHF (congestive heart failure) (HCC)/EF 60 to 65 % 06/02/2019   Metacarpal bone fracture 03/15/2012   CLOSED FRACTURE OF LATERAL MALLEOLUS 01/03/2011   DERANGEMENT MENISCUS 02/02/2009   KNEE PAIN 02/02/2009   History of colonic polyps 04/19/2005       Phylliss Blakes, MD [facility]  See AMION to contact appropriate on-call provider   MDM- high

## 2023-09-19 NOTE — ED Notes (Signed)
Report received from Zelphia Cairo RN and Agustina Caroli RN. Assumed care of pt at this time.

## 2023-09-19 NOTE — ED Notes (Signed)
Pt states he was changing his car tire when the car and tire fell onto pt's left leg. Obvious deformity. Pulses present. Sensation intact. Pt states he was under car for 1-2 hours before family found pt. GCS 15.
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
## 2023-09-19 NOTE — Consult Note (Incomplete)
Cardiology Consultation   Patient ID: Jorge West MRN: [id number]; DOB: 1946-06-08  Admit date: 09/19/2023 Date of Consult: 09/19/2023  PCP:  Elizabeth Palau, FNP    HeartCare Providers Cardiologist:  None   { Click here to update MD or APP on Care Team, Refresh:1}     Patient Profile:   Jorge West is a 77 y.o. 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 Adela Lank.  History of Present Illness:   Jorge West ***   Past Medical History:  Diagnosis Date   Atrial fibrillation [facility])    Had an ablation done   Atypical mole 06/14/2000   moderate/marked on mid back [facility])   Atypical mole 11/29/2001   slight/moderate on right shoulder [facility] Glow)   Atypical mole 11/29/2001   moderate on right lateral abdomen [facility])   Atypical mole 11/29/2001   moderate on right forearm [facility])   Atypical mole 01/11/2007   moderate on lower mid back   Atypical mole 07/14/2008   SK and atypical solar lentigo on left jawline (widershave)   Basal cell carcinoma 07/29/1996   back left lower ear - CX3+excision   Basal cell carcinoma 09/22/2004   basosquamous on right tip of nose (MOHs)   Basal cell carcinoma 10/20/2005   superficial on upper center back - CX3+5FU   Basal cell carcinoma 10/20/2011   right upper back - tx p bx   Basal cell carcinoma 02/07/2018   superficial/nodular on left upper arm - CX3+cautery+5FU   Basal cell carcinoma 10/23/2018   infiltrative on left sideburn [facility])   Benign prostate hyperplasia    Cataract    left and removed   Depression    Depression    Diverticulosis    Erectile dysfunction    Hyperlipidemia    Hypertension    Impaired fasting glucose    Nocturia    Personal history of colonic polyps 04/19/2005   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   Left Temporal Scalp (well diff)   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   left post auricular - clear at visit on 12/28/2001   Squamous cell carcinoma of skin 10/08/2003   well differentiated below outer left eye - MOHs   Squamous cell carcinoma of skin 06/02/2008   well differentiated behind left ear   Squamous cell carcinoma of skin 10/20/2011   KA on left elbow   Squamous cell carcinoma of skin 03/02/2015   well differentiated on right outer cheek - CX3+excision   Squamous cell carcinoma of skin 03/01/2017   well differentiated on left forearm - tx p bx   Squamous cell carcinoma of skin 03/07/2018   moderately differentiated on left jawline - CX3+excision    Past Surgical History:  Procedure Laterality Date   CATARACT EXTRACTION     COLONOSCOPY     OPEN REDUCTION INTERNAL FIXATION (ORIF) METACARPAL  09/24/2012   Procedure: OPEN REDUCTION INTERNAL FIXATION (ORIF) METACARPAL;  Surgeon: Sharma Covert, MD;  Location: [facility] OR;  Service: Orthopedics;  Laterality: Left;  and proximal phalanges.   surgery for skin cancer       {Home Medications (Optional):21181}  Inpatient Medications: Scheduled Meds:  sodium chloride   Intravenous Once   Continuous Infusions:  sodium chloride 1,000 mL (09/19/23 1902)  PRN Meds: sodium chloride  Allergies:    Allergies  Allergen Reactions   Terazosin Hcl     Dizziness and hypotension    Social History:   Social History   Socioeconomic History   Marital status: Married    Spouse name: Not on file   Number of children: Not on file   Years of education: social deg   Highest education level: Not on file  Occupational History   Not on file  Tobacco Use   Smoking status: Never   Smokeless tobacco: Former   Tobacco comments:    10-15 yeras quit   Vaping Use   Vaping status: Never Used  Substance and Sexual Activity   Alcohol use: Not Currently    Alcohol/week: 0.0 standard drinks of  alcohol   Drug use: Never   Sexual activity: Not on file  Other Topics Concern   Not on file  Social History Narrative   Not on file   Social Determinants of Health   Financial Resource Strain: Low Risk  (04/24/2023)   Received from [facility], [facility]   Overall Financial Resource Strain (CARDIA)    Difficulty of Paying Living Expenses: Not hard at all  Food Insecurity: No Food Insecurity (04/24/2023)   Received from [facility], [facility]   Hunger Vital Sign    Worried About Running Out of Food in the Last Year: Never true    Ran Out of Food in the Last Year: Never true  Transportation Needs: No Transportation Needs (04/24/2023)   Received from [employer], [facility]   PRAPARE - Transportation    Lack of Transportation (Medical): No    Lack of Transportation (Non-Medical): No  Physical Activity: Inactive (04/24/2023)   Received from [facility], [facility]   Exercise Vital Sign    Days of Exercise per Week: 0 days    Minutes of Exercise per Session: 20 min  Stress: No Stress Concern Present (07/18/2023)   Received from [facility] - Occupational Stress Questionnaire    Feeling of Stress : Only a little  Social Connections: Moderately Integrated (04/24/2023)   Received from [facility], [facility]   Social Network    How would you rate your social network (family, work, friends)?: Adequate participation with social networks  Intimate Partner Violence: Not At Risk (07/26/2023)   Received from [facility]   HITS    Over the last 12 months how often did your partner physically hurt you?: 1    Over the last 12 months how often did your partner insult you or talk down to you?: 1    Over the last 12 months how often did your partner threaten you with physical harm?: 1    Over the last 12 months how often did your partner scream or curse at you?: 1    Family History:   *** Family History  Problem Relation Age of  Onset   Cancer Other        family history    Colon cancer Neg Hx    Colon polyps Neg Hx    Rectal cancer Neg Hx    Stomach cancer Neg Hx      ROS:  Please see the history of present illness.  *** All other ROS reviewed and negative.     Physical Exam/Data:   Vitals:   09/19/23 2150 09/19/23 2153 09/19/23 2155 09/19/23 2205  BP: (!) 52/43 (!) 64/40 90/75 93/68  Pulse: (!) 141  70 (!) 146  Resp: (!) 27 19 18 20   Temp:      TempSrc:      SpO2: (!) 89% 91% 100% 100%  Weight:      Height:        Intake/Output Summary (Last 24 hours) at 09/19/2023 2217 Last data filed at 09/19/2023 2044 Gross per 24 hour  Intake 402 ml  Output --  Net 402 ml      09/19/2023    7:57 PM 09/19/2023    7:20 PM 09/05/2023    9:38 AM  Last 3 Weights  Weight (lbs) 188 lb 186 lb 186 lb 1.6 oz  Weight (kg) 85.276 kg 84.369 kg 84.414 kg     Body mass index is 26.98 kg/m.  General:  Well nourished, well developed, in no acute distress*** HEENT: normal Neck: no JVD Vascular: No carotid bruits; Distal pulses 2+ bilaterally Cardiac:  normal S1, S2; RRR; no murmur *** Lungs:  clear to auscultation bilaterally, no wheezing, rhonchi or rales  Abd: soft, nontender, no hepatomegaly  Ext: no edema Musculoskeletal:  No deformities, BUE and BLE strength normal and equal Skin: warm and dry  Neuro:  CNs 2-12 intact, no focal abnormalities noted Psych:  Normal affect   EKG:  The EKG was personally reviewed and demonstrates:  *** Telemetry:  Telemetry was personally reviewed and demonstrates:  ***  Relevant CV Studies: ***  Laboratory Data:  High Sensitivity Troponin:  No results for input(s): "TROPONINIHS" in the last 720 hours.   Chemistry Recent Labs  Lab 09/19/23 1912 09/19/23 1918  NA 140 142  K 3.4* 3.4*  CL 106 103  CO2 21*  --   GLUCOSE 203* 196*  BUN 20 20  CREATININE 2.36* 2.30*  CALCIUM 8.0*  --   GFRNONAA 28*  --   ANIONGAP 13  --     Recent Labs  Lab 09/19/23 1912   PROT 5.8*  ALBUMIN 2.9*  AST 88*  ALT 23  ALKPHOS 44  BILITOT 1.1   Lipids No results for input(s): "CHOL", "TRIG", "HDL", "LABVLDL", "LDLCALC", "CHOLHDL" in the last 168 hours.  Hematology Recent Labs  Lab 09/19/23 1912 09/19/23 1918  WBC 16.1*  --   RBC 4.24  --   HGB 12.4* 12.2*  HCT 37.9* 36.0*  MCV 89.4  --   MCH 29.2  --   MCHC 32.7  --   RDW 12.5  --   PLT 232  --    Thyroid No results for input(s): "TSH", "FREET4" in the last 168 hours.  BNPNo results for input(s): "BNP", "PROBNP" in the last 168 hours.  DDimer No results for input(s): "DDIMER" in the last 168 hours.   Radiology/Studies:  CT HEAD WO CONTRAST ( )  Result Date: 09/19/2023 CLINICAL DATA:  Head trauma, minor (Age >= 65y); Neck trauma (Age >= 65y) EXAM: CT HEAD WITHOUT CONTRAST CT CERVICAL SPINE WITHOUT CONTRAST TECHNIQUE: Multidetector CT imaging of the head and cervical spine was performed following the standard protocol without intravenous contrast. Multiplanar CT image reconstructions of the cervical spine were also generated. RADIATION DOSE REDUCTION: This exam was performed according to the departmental dose-optimization program which includes automated exposure control, adjustment of the mA and/or kV according to patient size and/or use of iterative reconstruction technique. COMPARISON:  None Available. FINDINGS: CT HEAD FINDINGS Brain: No evidence of large-territorial acute infarction. No parenchymal hemorrhage. No mass lesion. No extra-axial collection. No mass effect or midline shift. No hydrocephalus. Basilar  cisterns are patent. Vascular: No hyperdense vessel. Skull: No acute fracture or focal lesion. Sinuses/Orbits: Paranasal sinuses and mastoid air cells are clear. The orbits are unremarkable. Other: None. CT CERVICAL SPINE FINDINGS Alignment: Straightening of the normal cervical lordosis likely due to positioning and degenerative changes. Skull base and vertebrae: Multilevel moderate degenerative  changes spine. No severe osseous neural foraminal or central canal stenosis. No acute fracture. No aggressive appearing focal osseous lesion or focal pathologic process. Soft tissues and spinal canal: No prevertebral fluid or swelling. No visible canal hematoma. Upper chest: Unremarkable. Other: 1.9 x 1 cm right thyroid gland hypodense nodule. IMPRESSION: 1. No acute intracranial abnormality. 2. No acute displaced fracture or traumatic listhesis of the cervical spine. 3. A 1.9 cm right thyroid gland nodule. Recommend thyroid US (ref: J Am Coll Radiol. 2015 Feb;12(2): 143-50). These results were called by telephone at the time of interpretation on 09/19/2023 at 8:04 pm to provider [facility] , who verbally acknowledged these results. Electronically Signed   By: Tish Frederickson M.D.   On: 09/19/2023 20:43   CT CERVICAL SPINE WO CONTRAST  Result Date: 09/19/2023 CLINICAL DATA:  Head trauma, minor (Age >= 65y); Neck trauma (Age >= 65y) EXAM: CT HEAD WITHOUT CONTRAST CT CERVICAL SPINE WITHOUT CONTRAST TECHNIQUE: Multidetector CT imaging of the head and cervical spine was performed following the standard protocol without intravenous contrast. Multiplanar CT image reconstructions of the cervical spine were also generated. RADIATION DOSE REDUCTION: This exam was performed according to the departmental dose-optimization program which includes automated exposure control, adjustment of the mA and/or kV according to patient size and/or use of iterative reconstruction technique. COMPARISON:  None Available. FINDINGS: CT HEAD FINDINGS Brain: No evidence of large-territorial acute infarction. No parenchymal hemorrhage. No mass lesion. No extra-axial collection. No mass effect or midline shift. No hydrocephalus. Basilar cisterns are patent. Vascular: No hyperdense vessel. Skull: No acute fracture or focal lesion. Sinuses/Orbits: Paranasal sinuses and mastoid air cells are clear. The orbits are unremarkable. Other: None. CT  CERVICAL SPINE FINDINGS Alignment: Straightening of the normal cervical lordosis likely due to positioning and degenerative changes. Skull base and vertebrae: Multilevel moderate degenerative changes spine. No severe osseous neural foraminal or central canal stenosis. No acute fracture. No aggressive appearing focal osseous lesion or focal pathologic process. Soft tissues and spinal canal: No prevertebral fluid or swelling. No visible canal hematoma. Upper chest: Unremarkable. Other: 1.9 x 1 cm right thyroid gland hypodense nodule. IMPRESSION: 1. No acute intracranial abnormality. 2. No acute displaced fracture or traumatic listhesis of the cervical spine. 3. A 1.9 cm right thyroid gland nodule. Recommend thyroid US (ref: J Am Coll Radiol. 2015 Feb;12(2): 143-50). These results were called by telephone at the time of interpretation on 09/19/2023 at 8:04 pm to provider [facility] , who verbally acknowledged these results. Electronically Signed   By: Tish Frederickson M.D.   On: 09/19/2023 20:43   CT ANGIO LOWER EXT BILAT W &/OR WO CONTRAST  Result Date: 09/19/2023 CLINICAL DATA:  Knee trauma, dislocation suspected (Age >= 5y) EXAM: CT ANGIOGRAPHY OF ABDOMINAL AORTA WITH ILIOFEMORAL RUNOFF TECHNIQUE: Multidetector CT imaging of the lower abdomen, pelvis and lower extremities was performed using the standard protocol during bolus administration of intravenous contrast. Multiplanar CT image reconstructions and MIPs were obtained to evaluate the vascular anatomy. RADIATION DOSE REDUCTION: This exam was performed according to the departmental dose-optimization program which includes automated exposure control, adjustment of the mA and/or kV according to patient size and/or use of  iterative reconstruction technique. CONTRAST:  OMNIPAQUE IOHEXOL 350 MG/ML SOLN COMPARISON:  None Available. FINDINGS: VASCULAR Lower infrarenal abdominal aorta: Moderate severe atherosclerotic plaque. Normal caliber aorta without  aneurysm, dissection, vasculitis or significant stenosis. IMA: Patent without evidence of aneurysm, dissection, vasculitis or significant stenosis. RIGHT Lower Extremity Inflow: Mild atherosclerotic plaque. Common, internal and external iliac arteries are patent without evidence of aneurysm, dissection, vasculitis or significant stenosis. Outflow: Mild atherosclerotic plaque. Common, superficial and profunda femoral arteries and the popliteal artery are patent without evidence of aneurysm, dissection, vasculitis or significant stenosis. Runoff: Gradual non-opacification with no definite three-vessel runoff likely due to bolus timing as well as a severe atherosclerotic plaque with likely underlying discontinuous occlusions-likely chronic. LEFT Lower Extremity Inflow: Mild atherosclerotic plaque. Common, internal and external iliac arteries are patent without evidence of aneurysm, dissection, vasculitis or significant stenosis. Outflow: Mild atherosclerotic plaque. Common, superficial and profunda femoral arteries and the popliteal artery are patent without evidence of aneurysm, dissection, vasculitis or significant stenosis. Runoff: Gradual non-opacification with no definite three-vessel runoff likely due to bolus timing as well as a severe atherosclerotic plaque with likely underlying discontinuous occlusions-likely chronic. Veins: No obvious venous abnormality within the limitations of this arterial phase study. Review of the MIP images confirms the above findings. NON-VASCULAR Lower chest: Please see separately dictated CT chest 09/19/2023. Other: Please see separately dictated CT abdomen pelvis 09/19/2023 Musculoskeletal: Asymmetric enlargement of the left mid to lower thigh musculature compared to the right. No heterogeneity to suggest underlying abscess or hematoma. Associated left distal thigh, knee, proximal leg subcutaneus soft tissue edema. The edema is more pronounced along the medial and posterior left  knee. No subcutaneus soft tissue emphysema. No retained radiopaque foreign body. Left medial femoral condyle avulsion fracture (2:223, 6: 75-79). Small left joint effusion. No other acute fracture of the bones of the lower extremities. No evidence of dislocation of the bones of the bilateral lower extremities. No evidence of severe arthropathy. No aggressive appearing focal bone abnormality. IMPRESSION: VASCULAR Bilateral lower extremities demonstrate symmetric gradual non-opacification with no definite three-vessel runoff likely due to bolus timing as well as a severe atherosclerotic plaque with likely underlying discontinuous occlusions-likely chronic. Patency cannot be confirmed on this scan. NON-VASCULAR Left medial femoral condyle avulsion fracture. Suggestive of medial collateral ligament injury. Recommend MRI for further evaluation. Small left knee joint effusion. Asymmetric enlargement of the left mid to lower thigh musculature compared to the right. Associated left distal thigh, knee, proximal leg subcutaneus soft tissue edema. No subcutaneus soft tissue emphysema. Please see separately dictated CT chest, abdomen, pelvis. These results were called by telephone at the time of interpretation on 09/19/2023 at 8:04 pm to provider Dr. Fredricka Bonine, who verbally acknowledged these results. Electronically Signed   By: Tish Frederickson M.D.   On: 09/19/2023 20:31   CT CHEST ABDOMEN PELVIS W CONTRAST  Result Date: 09/19/2023 CLINICAL DATA:  Polytrauma, blunt. EXAM: CT CHEST, ABDOMEN, AND PELVIS WITH CONTRAST TECHNIQUE: Multidetector CT imaging of the chest, abdomen and pelvis was performed following the standard protocol during bolus administration of intravenous contrast. RADIATION DOSE REDUCTION: This exam was performed according to the departmental dose-optimization program which includes automated exposure control, adjustment of the mA and/or kV according to patient size and/or use of iterative reconstruction  technique. CONTRAST:  OMNIPAQUE IOHEXOL 350 MG/ML SOLN COMPARISON:  None Available. FINDINGS: CHEST: Cardiovascular: No aortic injury. 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 The descending thoracic aorta is  normal in caliber. Aortic valve leaflet calcification. Four-vessel coronary calcification. Moderate atherosclerotic plaque of the aorta. Enlarged heart size. No significant pericardial effusion. The main pulmonary artery measures at upper limits of normal: 3.1 cm. No central or proximal segmental pulmonary embolus. Mediastinum/Nodes: No pneumomediastinum. No mediastinal hematoma. Esophageal lumen is filled with fluid.  Small hiatal hernia. The thyroid is grossly unremarkable. The central airways are patent. No mediastinal, hilar, or axillary lymphadenopathy. Lungs/Pleura: Bilateral lower lobe atelectasis. No focal consolidation. No pulmonary nodule. No pulmonary mass. No pulmonary contusion or laceration. No pneumatocele formation. No pleural effusion. No pneumothorax. No hemothorax. Musculoskeletal/Chest wall: No chest wall mass. No acute rib or sternal fracture. Age-indeterminate T12 anterior wedge compression fracture with 50% vertebral body height loss. Mild retrolisthesis of L1 on L2. ABDOMEN / PELVIS: Hepatobiliary: Not enlarged. No focal lesion. No laceration or subcapsular hematoma. The gallbladder is otherwise unremarkable with no radio-opaque gallstones. No biliary ductal dilatation. Pancreas: Normal pancreatic contour. No main pancreatic duct dilatation. Spleen: Not enlarged. No focal lesion. No laceration, subcapsular hematoma, or vascular injury. Adrenals/Urinary Tract: No nodularity bilaterally. Bilateral kidneys enhance symmetrically. No hydronephrosis. No contusion, laceration, or subcapsular hematoma. No injury to the vascular structures or collecting systems. No hydroureter. The urinary bladder is unremarkable. On delayed imaging, there is  no urothelial wall thickening and there are no filling defects in the opacified portions of the bilateral collecting systems or ureters. Stomach/Bowel: No small or large bowel wall thickening or dilatation. Colonic diverticulosis. The appendix is unremarkable. Vasculature/Lymphatics: Severe atherosclerotic plaque. No abdominal aorta or iliac aneurysm. No active contrast extravasation or pseudoaneurysm. No abdominal, pelvic, inguinal lymphadenopathy. Reproductive: Prostate is unremarkable. Other: No simple free fluid ascites. No pneumoperitoneum. No hemoperitoneum. No mesenteric hematoma identified. No organized fluid collection. Musculoskeletal: No significant soft tissue hematoma. Small fat containing umbilical hernia. No acute pelvic fracture. No spinal fracture. Ports and Devices: None. IMPRESSION: 1. Age-indeterminate, possibly acute, T12 anterior wedge compression fracture. Correlate with point tenderness to palpation for an acute component. 2. No acute intrathoracic, intra-abdominal, intrapelvic traumatic injury. 3. No acute fracture or traumatic malalignment of the thoracic spine. 4. Small hiatal hernia with esophageal lumen filled with fluid. Correlate clinically and consider enteric tube placement. Other imaging findings of potential clinical significance: 1. 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 Recommend annual imaging followup by CTA or MRA. This recommendation follows 2010 ACCF/AHA/AATS/ACR/ASA/SCA/SCAI/SIR/STS/SVM Guidelines for the Diagnosis and Management of Patients with Thoracic Aortic Disease. Circulation. 2010; 121: Y865-H846. Aortic aneurysm NOS (ICD10-I71.9). 2. Aortic Atherosclerosis (ICD10-I70.0) including four-vessel coronary artery and aortic valve leaflet calcifications-correlate for aortic stenosis. 3. Cardiomegaly. These results were called by telephone at the time of interpretation on 09/19/2023 at 8:04 pm to provider [facility] , who verbally acknowledged these results.  Electronically Signed   By: Tish Frederickson M.D.   On: 09/19/2023 20:17   DG Pelvis Portable  Result Date: 09/19/2023 CLINICAL DATA:  Trauma deformity/abrasion to left knee after pt had a tire on his knee for ab an hour per family. EXAM: PORTABLE PELVIS 1-2 VIEWS COMPARISON:  None Available. FINDINGS: There is no evidence of pelvic fracture or diastasis. No acute displaced fracture or dislocation of either hips. No pelvic bone lesions are seen. Degenerative changes of visualized lower lumbar spine. IMPRESSION: Negative for acute traumatic injury. Electronically Signed   By: Tish Frederickson M.D.   On: 09/19/2023 19:49   DG Chest Port 1 View  Result Date: 09/19/2023 CLINICAL DATA:  Trauma deformity/abrasion to left knee after pt had a  tire on his knee for ab an hour per family. EXAM: PORTABLE CHEST 1 VIEW COMPARISON:  Chest x-ray 10/23/2019 FINDINGS: Slightly more prominent cardiomediastinal silhouette likely due rotation. Otherwise the heart and mediastinal contours are unchanged. Atherosclerotic plaque. No focal consolidation. No pulmonary edema. No pleural effusion. No pneumothorax. No acute osseous abnormality. IMPRESSION: 1. No active disease. 2.  Aortic Atherosclerosis (ICD10-I70.0). Electronically Signed   By: Tish Frederickson M.D.   On: 09/19/2023 19:47     Assessment and Plan:   ***   Risk Assessment/Risk Scores:  {Complete the following score calculators/questions to meet required metrics.  Press F2         :[id number]}   {Is the patient being seen for unstable angina, ACS, NSTEMI or STEMI?:[phone]} {Does this patient have CHF or CHF symptoms?      :[id number]} {Does this patient have ATRIAL FIBRILLATION?:[phone]}  {Are we signing off today?:[id number]}  For questions or updates, please contact [facility] Please consult [email or web address] for contact info under    Signed, Karl Ito, MD  09/19/2023 10:17 PM

## 2023-09-19 NOTE — Progress Notes (Signed)
   09/19/23 1900  Spiritual Encounters  Type of Visit Initial  Care provided to: Pt not available  Referral source Trauma page  Reason for visit Trauma  OnCall Visit No   Chaplain responded to a level;one trauma the patient, Jorge West was attended to by the medical team.  I visited with the patient's family members. They were ok and declined chaplain support at this time.  If a chap;ain is requested someone will respond.   [name] [facility]  [phone]

## 2023-09-19 NOTE — H&P (Incomplete)
History and Physical    LAROME SHADBOLT WUX:[id number] DOB: 04/14/1946 DOA: 09/19/2023  PCP: Elizabeth Palau, FNP   Patient coming from: {Blank single:[id number]::"Home","Clinic","SNF","ALF","Group Home","Hospice","***"}   Chief Complaint:  Chief Complaint  Patient presents with   Trauma   Level 1   Crush Injury   ED TRIAGE note:  HPI:  HENRYK VANESS is a 77 y.o. 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  He was reportedly changing a tire in the car/tire fell on his left leg.  Per EMS report he was trapped for about 2 hours until his family found him and then was brought to [facility] emergency department for evaluation.  Patient notes left knee pain, right knee pain as well as back pain.  Denies loss of consciousness, headache, neck pain, chest pain, abdominal pain or pelvic pain at this time.  He is alert and oriented.  Patient reported he has having hypotension for last few weeks and primary care provider titrating blood pressure regimen.  He also has ongoing dyspnea with minimal exertion.   ED Course:  At presentation to ED heart rate in between 46 to 156.EKG showing atrial fibrillation heart rate 120. CBC showing low potassium 3.4, low bicarb 21, elevated blood glucose 230, elevated creatinine 2.36, low albumin 2.9 transaminitis.   CBC showing leukocytosis 16, hemoglobin 12.4, hematocrit 37, MCV 89. Elevated CK 7242. Elevated pro time and INR. Elevated lactic acid 4.8.  Chest x-ray unremarkable.  X-ray pelvis unremarkable.  CT angiogram bilateral lower extremities: VASCULAR  Bilateral lower extremities demonstrate symmetric gradual non-opacification with no definite three-vessel runoff likely due  to bolus timing as well as a severe atherosclerotic plaque with likely underlying discontinuous occlusions-likely chronic. Patency cannot be confirmed on this scan.  NON-VASCULAR  Left medial femoral condyle avulsion fracture. Suggestive of medial collateral ligament injury. Recommend MRI for further evaluation.  Small left knee joint effusion.  Asymmetric enlargement of the left mid to lower thigh musculature compared to the right. Associated left distal thigh, knee, proximal leg subcutaneus soft tissue edema. No subcutaneus soft tissue emphysema. CT head no acute intracranial abnormality.  No displaced fracture of the cervical spine.  1.9 cm thyroid gland nodule  CT abdomen pelvis finding: IMPRESSION: 1. Age-indeterminate, possibly acute, T12 anterior wedge compression fracture. Correlate with point tenderness to palpation for an acute component. 2. No acute intrathoracic, intra-abdominal, intrapelvic traumatic injury. 3. No acute fracture or traumatic malalignment of the thoracic spine. 4. Small hiatal hernia with esophageal lumen filled with fluid. Correlate clinically and consider enteric tube placement.  Other imaging findings of potential clinical significance:  1. 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 Recommend annual imaging followup by CTA or MRA. This recommendation follows 2010 ACCF/AHA/AATS/ACR/ASA/SCA/SCAI/SIR/STS/SVM Guidelines for the Diagnosis and Management of Patients with Thoracic Aortic Disease. Circulation. 2010; 121: O536-U440. Aortic aneurysm NOS (ICD10-I71.9). 2. Aortic Atherosclerosis (ICD10-I70.0) including four-vessel coronary artery and aortic valve leaflet calcifications-correlate for aortic stenosis. 3. Cardiomegaly.  General Surgery has been evaluated patient in the ED for the femoral fracture.  Per general surgery evaluation patient has ligamentous tear and avulsion fracture of the medial distal femoral epicondyle.  However as patient is  A-fib RVR and hypotension concern for cardiogenic shock I recommended ICU admission as well as recommended cardiology  as well. ED physician consulted orthopedic surgeon Dr.Swinteck .  They will see patient in the a.m. orthopedic recommended knee immobilizer and nonweightbearing.  ED physician ordered an MRI of the left knee for further evaluation.  Due to soft blood pressure in the ED patient received 250 bolus of NS.  Blood pressure has been improved from 64/42 112/72. Patient also has acute hypoxic respiratory failure initially was on nonrebreather 15 L has been transition to 4 L oxygen maintaining O2 sat 100%. Atrial fibrillation with with elevated heart rate 156 improved to 127 without any intervention.  ED physician reported general surgery recommended consult trauma surgeon, trauma surgeon said patient is unstable for surgery and will see patient in the a.m.  By the meantime trauma surgery recommended consult ICU.  ICU provider said consult cardiology.  Cardiology Dr. Lendell Caprice has been seeing the patient and per Dr. Adela Lank waiting for recommendation.   In the ED patient received treatment include NS bolus to 250 mL and fentanyl 50 mcg.  Immobilizer has been placed.   Hospitalist has been contacted for further evaluation management of femoral fracture, thoracic vertebral fracture, acute kidney injury, rhabdomyolysis, lactic acidosis, concern for development of cardiogenic shock and acute hypoxic respiratory failure.  Review of Systems:  ROS  Past Medical History:  Diagnosis Date   Atrial fibrillation [facility])    Had an ablation done   Atypical mole 06/14/2000   moderate/marked on mid back Nino Glow)   Atypical mole 11/29/2001   slight/moderate on right shoulder Nino Glow)   Atypical mole 11/29/2001   moderate on right lateral abdomen Nino Glow)   Atypical mole 11/29/2001   moderate on right forearm Nino Glow)   Atypical mole 01/11/2007   moderate on lower mid back   Atypical  mole 07/14/2008   SK and atypical solar lentigo on left jawline (widershave)   Basal cell carcinoma 07/29/1996   back left lower ear - CX3+excision   Basal cell carcinoma 09/22/2004   basosquamous on right tip of nose (MOHs)   Basal cell carcinoma 10/20/2005   superficial on upper center back - CX3+5FU   Basal cell carcinoma 10/20/2011   right upper back - tx p bx   Basal cell carcinoma 02/07/2018   superficial/nodular on left upper arm - CX3+cautery+5FU   Basal cell carcinoma 10/23/2018   infiltrative on left sideburn [facility])   Benign prostate hyperplasia    Cataract    left and removed   Depression    Depression    Diverticulosis    Erectile dysfunction    Hyperlipidemia    Hypertension    Impaired fasting glucose    Nocturia    Personal history of colonic polyps 04/19/2005   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   Left Temporal Scalp (well diff)   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   left post auricular - clear at visit on 12/28/2001   Squamous cell carcinoma of skin 10/08/2003   well differentiated below outer left eye - MOHs   Squamous cell carcinoma of skin 06/02/2008   well differentiated behind left ear   Squamous cell carcinoma of skin 10/20/2011   KA on left elbow   Squamous cell carcinoma of skin 03/02/2015   well differentiated on right outer cheek - CX3+excision   Squamous cell carcinoma of skin 03/01/2017   well differentiated on left forearm - tx p bx   Squamous cell carcinoma of skin 03/07/2018   moderately differentiated on left jawline - CX3+excision    Past Surgical History:  Procedure Laterality Date   CATARACT EXTRACTION     COLONOSCOPY     OPEN REDUCTION INTERNAL FIXATION (ORIF) METACARPAL  09/24/2012   Procedure: OPEN REDUCTION INTERNAL FIXATION (ORIF) METACARPAL;  Surgeon: Sharma Covert, MD;  Location: [facility] OR;  Service: Orthopedics;  Laterality: Left;  and proximal phalanges.   surgery for skin cancer       reports that he  has never smoked. He has quit using smokeless tobacco. He reports that he does not currently use alcohol. He reports that he does not use drugs.  Allergies  Allergen Reactions   Terazosin Hcl     Dizziness and hypotension    Family History  Problem Relation Age of Onset   Cancer Other        family history    Colon cancer Neg Hx    Colon polyps Neg Hx    Rectal cancer Neg Hx    Stomach cancer Neg Hx     Prior to Admission medications   Medication Sig Start Date End Date Taking? Authorizing Provider  acetaminophen (TYLENOL) 325 MG tablet Take 2 tablets (650 mg total) by mouth every 6 (six) hours as needed for mild pain (or Fever >/= 101). 06/03/19   Emokpae, Courage, MD  ALPRAZolam Prudy Feeler) 0.5 MG tablet Take 0.5 mg by mouth 2 (two) times daily as needed for anxiety.    [provider]  amLODipine (NORVASC) 5 MG tablet Take 10 mg by mouth daily. Patient not taking: Reported on 08/30/2023 03/03/20   [provider]  apixaban (ELIQUIS) 5 MG TABS tablet Take 1 tablet (5 mg total) by mouth 2 (two) times daily. Blood thinner for stroke prevention 06/03/19   Shon Hale, MD  brimonidine-timolol (COMBIGAN) 0.2-0.5 % ophthalmic solution Place 1 drop into the left eye daily. 02/29/16   [provider]  buPROPion (WELLBUTRIN XL) 300 MG 24 hr tablet Take 300 mg by mouth daily.    [provider]  Cholecalciferol (VITAMIN D3) 3000 UNITS TABS Take 1 tablet by mouth daily.     [provider]  clopidogrel (PLAVIX) 75 MG tablet Take 75 mg by mouth daily.    [provider]  cyanocobalamin (VITAMIN B12) 100 MCG tablet Take 100 mcg by mouth daily.    [provider]  escitalopram (LEXAPRO) 10 MG tablet Take 10 mg by mouth daily.    [provider]  methocarbamol (ROBAXIN) 500 MG tablet Take 1 tablet (500 mg total) by mouth 4 (four) times daily. 01/31/21   Elson Areas, PA-C  metoprolol tartrate (LOPRESSOR) 25 MG tablet Take 25 mg  by mouth daily.    [provider]  nitroGLYCERIN (NITROSTAT) 0.4 MG SL tablet Place 0.4 mg under the tongue every 5 (five) minutes as needed for chest pain.    [provider]  Omega-3 Fatty Acids (FISH OIL PO) Take 1 capsule by mouth daily.     [provider]  pantoprazole (PROTONIX) 40 MG tablet Take 40 mg by mouth daily.    [provider]  pravastatin (PRAVACHOL) 20 MG tablet Take 20 mg by mouth daily.  Patient not taking: Reported on 08/30/2023    [provider]  rosuvastatin (CRESTOR) 40 MG tablet Take 40 mg by mouth daily.    [provider]  sacubitril-valsartan (ENTRESTO) 24-26 MG Take 1 tablet by mouth daily.    [provider]  vitamin C (ASCORBIC ACID) 500 MG tablet Take 1 tablet (500 mg total) by mouth daily. 09/25/12  Bradly Bienenstock, MD     Physical Exam: Vitals:   09/19/23 2215 09/19/23 2220 09/19/23 2226 09/19/23 2230  BP: 90/68 108/87 105/89 112/77  Pulse: (!) 135 (!) 132 (!) 156 (!) 127  Resp: (!) 22 18 (!) 26 (!) 25  Temp:      TempSrc:      SpO2: 99% 100% 100% 100%  Weight:      Height:        Physical Exam   Labs on Admission: I have personally reviewed following labs and imaging studies  CBC: Recent Labs  Lab 09/19/23 1912 09/19/23 1918  WBC 16.1*  --   HGB 12.4* 12.2*  HCT 37.9* 36.0*  MCV 89.4  --   PLT 232  --    Basic Metabolic Panel: Recent Labs  Lab 09/19/23 1912 09/19/23 1918  NA 140 142  K 3.4* 3.4*  CL 106 103  CO2 21*  --   GLUCOSE 203* 196*  BUN 20 20  CREATININE 2.36* 2.30*  CALCIUM 8.0*  --    GFR: Estimated Creatinine Clearance: 27.8 mL/min (A) (by C-G formula based on SCr of 2.3 mg/dL (H)). Liver Function Tests: Recent Labs  Lab 09/19/23 1912  AST 88*  ALT 23  ALKPHOS 44  BILITOT 1.1  PROT 5.8*  ALBUMIN 2.9*   No results for input(s): "LIPASE", "AMYLASE" in the last 168 hours. No results for input(s): "AMMONIA" in the last 168 hours. Coagulation  Profile: Recent Labs  Lab 09/19/23 1912  INR 1.5*   Cardiac Enzymes: Recent Labs  Lab 09/19/23 1912  CKTOTAL 7,242*   BNP (last 3 results) No results for input(s): "BNP" in the last 8760 hours. HbA1C: No results for input(s): "HGBA1C" in the last 72 hours. CBG: No results for input(s): "GLUCAP" in the last 168 hours. Lipid Profile: No results for input(s): "CHOL", "HDL", "LDLCALC", "TRIG", "CHOLHDL", "LDLDIRECT" in the last 72 hours. Thyroid Function Tests: No results for input(s): "TSH", "T4TOTAL", "FREET4", "T3FREE", "THYROIDAB" in the last 72 hours. Anemia Panel: No results for input(s): "VITAMINB12", "FOLATE", "FERRITIN", "TIBC", "IRON", "RETICCTPCT" in the last 72 hours. Urine analysis:    Component Value Date/Time   COLORURINE YELLOW 10/23/2019 1956   APPEARANCEUR CLEAR 10/23/2019 1956   LABSPEC 1.013 10/23/2019 1956   PHURINE 6.0 10/23/2019 1956   GLUCOSEU NEGATIVE 10/23/2019 1956   HGBUR MODERATE (A) 10/23/2019 1956   BILIRUBINUR NEGATIVE 10/23/2019 1956   KETONESUR NEGATIVE 10/23/2019 1956   PROTEINUR 30 (A) 10/23/2019 1956   NITRITE NEGATIVE 10/23/2019 1956   LEUKOCYTESUR NEGATIVE 10/23/2019 1956    Radiological Exams on Admission: I have personally reviewed images CT HEAD WO CONTRAST ( )  Result Date: 09/19/2023 CLINICAL DATA:  Head trauma, minor (Age >= 65y); Neck trauma (Age >= 65y) EXAM: CT HEAD WITHOUT CONTRAST CT CERVICAL SPINE WITHOUT CONTRAST TECHNIQUE: Multidetector CT imaging of the head and cervical spine was performed following the standard protocol without intravenous contrast. Multiplanar CT image reconstructions of the cervical spine were also generated. RADIATION DOSE REDUCTION: This exam was performed according to the departmental dose-optimization program which includes automated exposure control, adjustment of the mA and/or kV according to patient size and/or use of iterative reconstruction technique. COMPARISON:  None Available. FINDINGS: CT  HEAD FINDINGS Brain: No evidence of large-territorial acute infarction. No parenchymal hemorrhage. No mass lesion. No extra-axial collection. No mass effect or midline shift. No hydrocephalus. Basilar cisterns are patent. Vascular: No hyperdense vessel. Skull: No acute fracture or focal lesion. Sinuses/Orbits: Paranasal sinuses and mastoid  air cells are clear. The orbits are unremarkable. Other: None. CT CERVICAL SPINE FINDINGS Alignment: Straightening of the normal cervical lordosis likely due to positioning and degenerative changes. Skull base and vertebrae: Multilevel moderate degenerative changes spine. No severe osseous neural foraminal or central canal stenosis. No acute fracture. No aggressive appearing focal osseous lesion or focal pathologic process. Soft tissues and spinal canal: No prevertebral fluid or swelling. No visible canal hematoma. Upper chest: Unremarkable. Other: 1.9 x 1 cm right thyroid gland hypodense nodule. IMPRESSION: 1. No acute intracranial abnormality. 2. No acute displaced fracture or traumatic listhesis of the cervical spine. 3. A 1.9 cm right thyroid gland nodule. Recommend thyroid US (ref: J Am Coll Radiol. 2015 Feb;12(2): 143-50). These results were called by telephone at the time of interpretation on 09/19/2023 at 8:04 pm to provider [facility] , who verbally acknowledged these results. Electronically Signed   By: Tish Frederickson M.D.   On: 09/19/2023 20:43   CT CERVICAL SPINE WO CONTRAST  Result Date: 09/19/2023 CLINICAL DATA:  Head trauma, minor (Age >= 65y); Neck trauma (Age >= 65y) EXAM: CT HEAD WITHOUT CONTRAST CT CERVICAL SPINE WITHOUT CONTRAST TECHNIQUE: Multidetector CT imaging of the head and cervical spine was performed following the standard protocol without intravenous contrast. Multiplanar CT image reconstructions of the cervical spine were also generated. RADIATION DOSE REDUCTION: This exam was performed according to the departmental dose-optimization program  which includes automated exposure control, adjustment of the mA and/or kV according to patient size and/or use of iterative reconstruction technique. COMPARISON:  None Available. FINDINGS: CT HEAD FINDINGS Brain: No evidence of large-territorial acute infarction. No parenchymal hemorrhage. No mass lesion. No extra-axial collection. No mass effect or midline shift. No hydrocephalus. Basilar cisterns are patent. Vascular: No hyperdense vessel. Skull: No acute fracture or focal lesion. Sinuses/Orbits: Paranasal sinuses and mastoid air cells are clear. The orbits are unremarkable. Other: None. CT CERVICAL SPINE FINDINGS Alignment: Straightening of the normal cervical lordosis likely due to positioning and degenerative changes. Skull base and vertebrae: Multilevel moderate degenerative changes spine. No severe osseous neural foraminal or central canal stenosis. No acute fracture. No aggressive appearing focal osseous lesion or focal pathologic process. Soft tissues and spinal canal: No prevertebral fluid or swelling. No visible canal hematoma. Upper chest: Unremarkable. Other: 1.9 x 1 cm right thyroid gland hypodense nodule. IMPRESSION: 1. No acute intracranial abnormality. 2. No acute displaced fracture or traumatic listhesis of the cervical spine. 3. A 1.9 cm right thyroid gland nodule. Recommend thyroid US (ref: J Am Coll Radiol. 2015 Feb;12(2): 143-50). These results were called by telephone at the time of interpretation on 09/19/2023 at 8:04 pm to provider [facility] , who verbally acknowledged these results. Electronically Signed   By: Tish Frederickson M.D.   On: 09/19/2023 20:43   CT ANGIO LOWER EXT BILAT W &/OR WO CONTRAST  Result Date: 09/19/2023 CLINICAL DATA:  Knee trauma, dislocation suspected (Age >= 5y) EXAM: CT ANGIOGRAPHY OF ABDOMINAL AORTA WITH ILIOFEMORAL RUNOFF TECHNIQUE: Multidetector CT imaging of the lower abdomen, pelvis and lower extremities was performed using the standard protocol during  bolus administration of intravenous contrast. Multiplanar CT image reconstructions and MIPs were obtained to evaluate the vascular anatomy. RADIATION DOSE REDUCTION: This exam was performed according to the departmental dose-optimization program which includes automated exposure control, adjustment of the mA and/or kV according to patient size and/or use of iterative reconstruction technique. CONTRAST:  OMNIPAQUE IOHEXOL 350 MG/ML SOLN COMPARISON:  None Available. FINDINGS: VASCULAR Lower infrarenal  abdominal aorta: Moderate severe atherosclerotic plaque. Normal caliber aorta without aneurysm, dissection, vasculitis or significant stenosis. IMA: Patent without evidence of aneurysm, dissection, vasculitis or significant stenosis. RIGHT Lower Extremity Inflow: Mild atherosclerotic plaque. Common, internal and external iliac arteries are patent without evidence of aneurysm, dissection, vasculitis or significant stenosis. Outflow: Mild atherosclerotic plaque. Common, superficial and profunda femoral arteries and the popliteal artery are patent without evidence of aneurysm, dissection, vasculitis or significant stenosis. Runoff: Gradual non-opacification with no definite three-vessel runoff likely due to bolus timing as well as a severe atherosclerotic plaque with likely underlying discontinuous occlusions-likely chronic. LEFT Lower Extremity Inflow: Mild atherosclerotic plaque. Common, internal and external iliac arteries are patent without evidence of aneurysm, dissection, vasculitis or significant stenosis. Outflow: Mild atherosclerotic plaque. Common, superficial and profunda femoral arteries and the popliteal artery are patent without evidence of aneurysm, dissection, vasculitis or significant stenosis. Runoff: Gradual non-opacification with no definite three-vessel runoff likely due to bolus timing as well as a severe atherosclerotic plaque with likely underlying discontinuous occlusions-likely chronic.  Veins: No obvious venous abnormality within the limitations of this arterial phase study. Review of the MIP images confirms the above findings. NON-VASCULAR Lower chest: Please see separately dictated CT chest 09/19/2023. Other: Please see separately dictated CT abdomen pelvis 09/19/2023 Musculoskeletal: Asymmetric enlargement of the left mid to lower thigh musculature compared to the right. No heterogeneity to suggest underlying abscess or hematoma. Associated left distal thigh, knee, proximal leg subcutaneus soft tissue edema. The edema is more pronounced along the medial and posterior left knee. No subcutaneus soft tissue emphysema. No retained radiopaque foreign body. Left medial femoral condyle avulsion fracture (2:223, 6: 75-79). Small left joint effusion. No other acute fracture of the bones of the lower extremities. No evidence of dislocation of the bones of the bilateral lower extremities. No evidence of severe arthropathy. No aggressive appearing focal bone abnormality. IMPRESSION: VASCULAR Bilateral lower extremities demonstrate symmetric gradual non-opacification with no definite three-vessel runoff likely due to bolus timing as well as a severe atherosclerotic plaque with likely underlying discontinuous occlusions-likely chronic. Patency cannot be confirmed on this scan. NON-VASCULAR Left medial femoral condyle avulsion fracture. Suggestive of medial collateral ligament injury. Recommend MRI for further evaluation. Small left knee joint effusion. Asymmetric enlargement of the left mid to lower thigh musculature compared to the right. Associated left distal thigh, knee, proximal leg subcutaneus soft tissue edema. No subcutaneus soft tissue emphysema. Please see separately dictated CT chest, abdomen, pelvis. These results were called by telephone at the time of interpretation on 09/19/2023 at 8:04 pm to provider Dr. Fredricka Bonine, who verbally acknowledged these results. Electronically Signed   By: Tish Frederickson M.D.   On: 09/19/2023 20:31   CT CHEST ABDOMEN PELVIS W CONTRAST  Result Date: 09/19/2023 CLINICAL DATA:  Polytrauma, blunt. EXAM: CT CHEST, ABDOMEN, AND PELVIS WITH CONTRAST TECHNIQUE: Multidetector CT imaging of the chest, abdomen and pelvis was performed following the standard protocol during bolus administration of intravenous contrast. RADIATION DOSE REDUCTION: This exam was performed according to the departmental dose-optimization program which includes automated exposure control, adjustment of the mA and/or kV according to patient size and/or use of iterative reconstruction technique. CONTRAST:  OMNIPAQUE IOHEXOL 350 MG/ML SOLN COMPARISON:  None Available. FINDINGS: CHEST: Cardiovascular: No aortic injury. 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 The descending thoracic aorta is normal in caliber. Aortic valve leaflet calcification. Four-vessel coronary calcification. Moderate atherosclerotic plaque of the aorta. Enlarged heart size. No  significant pericardial effusion. The main pulmonary artery measures at upper limits of normal: 3.1 cm. No central or proximal segmental pulmonary embolus. Mediastinum/Nodes: No pneumomediastinum. No mediastinal hematoma. Esophageal lumen is filled with fluid.  Small hiatal hernia. The thyroid is grossly unremarkable. The central airways are patent. No mediastinal, hilar, or axillary lymphadenopathy. Lungs/Pleura: Bilateral lower lobe atelectasis. No focal consolidation. No pulmonary nodule. No pulmonary mass. No pulmonary contusion or laceration. No pneumatocele formation. No pleural effusion. No pneumothorax. No hemothorax. Musculoskeletal/Chest wall: No chest wall mass. No acute rib or sternal fracture. Age-indeterminate T12 anterior wedge compression fracture with 50% vertebral body height loss. Mild retrolisthesis of L1 on L2. ABDOMEN / PELVIS: Hepatobiliary: Not enlarged. No focal lesion. No laceration or  subcapsular hematoma. The gallbladder is otherwise unremarkable with no radio-opaque gallstones. No biliary ductal dilatation. Pancreas: Normal pancreatic contour. No main pancreatic duct dilatation. Spleen: Not enlarged. No focal lesion. No laceration, subcapsular hematoma, or vascular injury. Adrenals/Urinary Tract: No nodularity bilaterally. Bilateral kidneys enhance symmetrically. No hydronephrosis. No contusion, laceration, or subcapsular hematoma. No injury to the vascular structures or collecting systems. No hydroureter. The urinary bladder is unremarkable. On delayed imaging, there is no urothelial wall thickening and there are no filling defects in the opacified portions of the bilateral collecting systems or ureters. Stomach/Bowel: No small or large bowel wall thickening or dilatation. Colonic diverticulosis. The appendix is unremarkable. Vasculature/Lymphatics: Severe atherosclerotic plaque. No abdominal aorta or iliac aneurysm. No active contrast extravasation or pseudoaneurysm. No abdominal, pelvic, inguinal lymphadenopathy. Reproductive: Prostate is unremarkable. Other: No simple free fluid ascites. No pneumoperitoneum. No hemoperitoneum. No mesenteric hematoma identified. No organized fluid collection. Musculoskeletal: No significant soft tissue hematoma. Small fat containing umbilical hernia. No acute pelvic fracture. No spinal fracture. Ports and Devices: None. IMPRESSION: 1. Age-indeterminate, possibly acute, T12 anterior wedge compression fracture. Correlate with point tenderness to palpation for an acute component. 2. No acute intrathoracic, intra-abdominal, intrapelvic traumatic injury. 3. No acute fracture or traumatic malalignment of the thoracic spine. 4. Small hiatal hernia with esophageal lumen filled with fluid. Correlate clinically and consider enteric tube placement. Other imaging findings of potential clinical significance: 1. 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 Recommend  annual imaging followup by CTA or MRA. This recommendation follows 2010 ACCF/AHA/AATS/ACR/ASA/SCA/SCAI/SIR/STS/SVM Guidelines for the Diagnosis and Management of Patients with Thoracic Aortic Disease. Circulation. 2010; 121: K440-N027. Aortic aneurysm NOS (ICD10-I71.9). 2. Aortic Atherosclerosis (ICD10-I70.0) including four-vessel coronary artery and aortic valve leaflet calcifications-correlate for aortic stenosis. 3. Cardiomegaly. These results were called by telephone at the time of interpretation on 09/19/2023 at 8:04 pm to provider [facility] , who verbally acknowledged these results. Electronically Signed   By: Tish Frederickson M.D.   On: 09/19/2023 20:17   DG Pelvis Portable  Result Date: 09/19/2023 CLINICAL DATA:  Trauma deformity/abrasion to left knee after pt had a tire on his knee for ab an hour per family. EXAM: PORTABLE PELVIS 1-2 VIEWS COMPARISON:  None Available. FINDINGS: There is no evidence of pelvic fracture or diastasis. No acute displaced fracture or dislocation of either hips. No pelvic bone lesions are seen. Degenerative changes of visualized lower lumbar spine. IMPRESSION: Negative for acute traumatic injury. Electronically Signed   By: Tish Frederickson M.D.   On: 09/19/2023 19:49   DG Chest Port 1 View  Result Date: 09/19/2023 CLINICAL DATA:  Trauma deformity/abrasion to left knee after pt had a tire on his knee for ab an hour per family. EXAM: PORTABLE CHEST 1 VIEW COMPARISON:  Chest x-ray 10/23/2019  FINDINGS: Slightly more prominent cardiomediastinal silhouette likely due rotation. Otherwise the heart and mediastinal contours are unchanged. Atherosclerotic plaque. No focal consolidation. No pulmonary edema. No pleural effusion. No pneumothorax. No acute osseous abnormality. IMPRESSION: 1. No active disease. 2.  Aortic Atherosclerosis (ICD10-I70.0). Electronically Signed   By: Tish Frederickson M.D.   On: 09/19/2023 19:47    EKG: My personal interpretation of EKG shows:  ***    Assessment/Plan: Active Problems:   * No active hospital problems. *    Assessment and Plan: No notes have been filed under this hospital service. Service: Hospitalist      DVT prophylaxis:  {Blank single:19197::"Lovenox","SQ Heparin","IV heparin gtts","Xarelto","Eliquis","Coumadin","SCDs","***"} Code Status:  {Blank single:19197::"Full Code","DNR with Intubation","DNR/DNI(Do NOT Intubate)","Comfort Care","***"} Diet:  Family Communication:  *** Family was present at bedside, at the time of interview.  Opportunity was given to ask question and all questions were answered satisfactorily.  Disposition Plan:  ***  Consults:  ***  Admission status:   {Blank single:19197::"Observation","Inpatient"}, {Blank single:19197::"Med-Surg","Telemetry bed","Step Down Unit"}  Severity of Illness: {Observation/Inpatient:21159}    Tereasa Coop, MD [facility]  How to contact the [facility] Attending or Consulting provider 7A - 7P or covering provider during after hours 7P -7A, for this patient.  Check the care team in [facility] and look for a) attending/consulting [facility] provider listed and b) the [facility] team listed Log into [email or web address] and use 's universal password to access. If you do not have the password, please contact the hospital operator. Locate the [facility] provider you are looking for under [facility] and page to a number that you can be directly reached. If you still have difficulty reaching the provider, please page the [facility] (Director on Call) for the Hospitalists listed on amion for assistance.  09/19/2023, 10:52 PM

## 2023-09-20 ENCOUNTER — Emergency Department (HOSPITAL_COMMUNITY): Payer: Medicare PPO

## 2023-09-20 ENCOUNTER — Inpatient Hospital Stay (HOSPITAL_COMMUNITY): Payer: Medicare PPO

## 2023-09-20 ENCOUNTER — Encounter (HOSPITAL_COMMUNITY): Payer: Self-pay | Admitting: Internal Medicine

## 2023-09-20 ENCOUNTER — Encounter (HOSPITAL_COMMUNITY): Payer: Medicare PPO

## 2023-09-20 ENCOUNTER — Other Ambulatory Visit: Payer: Self-pay

## 2023-09-20 DIAGNOSIS — I700 Atherosclerosis of aorta: Secondary | ICD-10-CM | POA: Diagnosis present

## 2023-09-20 DIAGNOSIS — N1832 Chronic kidney disease, stage 3b: Secondary | ICD-10-CM

## 2023-09-20 DIAGNOSIS — K567 Ileus, unspecified: Secondary | ICD-10-CM | POA: Diagnosis not present

## 2023-09-20 DIAGNOSIS — N1831 Chronic kidney disease, stage 3a: Secondary | ICD-10-CM | POA: Diagnosis not present

## 2023-09-20 DIAGNOSIS — S8782XA Crushing injury of left lower leg, initial encounter: Secondary | ICD-10-CM | POA: Diagnosis not present

## 2023-09-20 DIAGNOSIS — I5033 Acute on chronic diastolic (congestive) heart failure: Secondary | ICD-10-CM | POA: Diagnosis not present

## 2023-09-20 DIAGNOSIS — F32A Depression, unspecified: Secondary | ICD-10-CM | POA: Diagnosis present

## 2023-09-20 DIAGNOSIS — I4819 Other persistent atrial fibrillation: Secondary | ICD-10-CM | POA: Diagnosis not present

## 2023-09-20 DIAGNOSIS — Z8616 Personal history of COVID-19: Secondary | ICD-10-CM | POA: Diagnosis not present

## 2023-09-20 DIAGNOSIS — D631 Anemia in chronic kidney disease: Secondary | ICD-10-CM | POA: Diagnosis present

## 2023-09-20 DIAGNOSIS — W208XXA Other cause of strike by thrown, projected or falling object, initial encounter: Secondary | ICD-10-CM | POA: Diagnosis present

## 2023-09-20 DIAGNOSIS — I429 Cardiomyopathy, unspecified: Secondary | ICD-10-CM | POA: Diagnosis present

## 2023-09-20 DIAGNOSIS — A419 Sepsis, unspecified organism: Secondary | ICD-10-CM | POA: Diagnosis not present

## 2023-09-20 DIAGNOSIS — E872 Acidosis, unspecified: Secondary | ICD-10-CM | POA: Insufficient documentation

## 2023-09-20 DIAGNOSIS — I959 Hypotension, unspecified: Secondary | ICD-10-CM | POA: Insufficient documentation

## 2023-09-20 DIAGNOSIS — S72432A Displaced fracture of medial condyle of left femur, initial encounter for closed fracture: Secondary | ICD-10-CM | POA: Diagnosis present

## 2023-09-20 DIAGNOSIS — E041 Nontoxic single thyroid nodule: Secondary | ICD-10-CM | POA: Diagnosis present

## 2023-09-20 DIAGNOSIS — R570 Cardiogenic shock: Secondary | ICD-10-CM | POA: Diagnosis present

## 2023-09-20 DIAGNOSIS — I5040 Unspecified combined systolic (congestive) and diastolic (congestive) heart failure: Secondary | ICD-10-CM

## 2023-09-20 DIAGNOSIS — S22080A Wedge compression fracture of T11-T12 vertebra, initial encounter for closed fracture: Secondary | ICD-10-CM | POA: Diagnosis present

## 2023-09-20 DIAGNOSIS — E440 Moderate protein-calorie malnutrition: Secondary | ICD-10-CM | POA: Diagnosis present

## 2023-09-20 DIAGNOSIS — I7121 Aneurysm of the ascending aorta, without rupture: Secondary | ICD-10-CM

## 2023-09-20 DIAGNOSIS — I10 Essential (primary) hypertension: Secondary | ICD-10-CM | POA: Diagnosis not present

## 2023-09-20 DIAGNOSIS — I4821 Permanent atrial fibrillation: Secondary | ICD-10-CM | POA: Diagnosis present

## 2023-09-20 DIAGNOSIS — K8000 Calculus of gallbladder with acute cholecystitis without obstruction: Secondary | ICD-10-CM | POA: Diagnosis not present

## 2023-09-20 DIAGNOSIS — J9601 Acute respiratory failure with hypoxia: Secondary | ICD-10-CM | POA: Diagnosis present

## 2023-09-20 DIAGNOSIS — I4891 Unspecified atrial fibrillation: Secondary | ICD-10-CM

## 2023-09-20 DIAGNOSIS — I2489 Other forms of acute ischemic heart disease: Secondary | ICD-10-CM | POA: Diagnosis present

## 2023-09-20 DIAGNOSIS — E669 Obesity, unspecified: Secondary | ICD-10-CM | POA: Diagnosis present

## 2023-09-20 DIAGNOSIS — S82145A Nondisplaced bicondylar fracture of left tibia, initial encounter for closed fracture: Secondary | ICD-10-CM | POA: Diagnosis present

## 2023-09-20 DIAGNOSIS — R571 Hypovolemic shock: Secondary | ICD-10-CM | POA: Diagnosis present

## 2023-09-20 DIAGNOSIS — N170 Acute kidney failure with tubular necrosis: Secondary | ICD-10-CM | POA: Diagnosis present

## 2023-09-20 DIAGNOSIS — I130 Hypertensive heart and chronic kidney disease with heart failure and stage 1 through stage 4 chronic kidney disease, or unspecified chronic kidney disease: Secondary | ICD-10-CM | POA: Diagnosis present

## 2023-09-20 DIAGNOSIS — N179 Acute kidney failure, unspecified: Secondary | ICD-10-CM | POA: Diagnosis not present

## 2023-09-20 DIAGNOSIS — S82832A Other fracture of upper and lower end of left fibula, initial encounter for closed fracture: Secondary | ICD-10-CM | POA: Diagnosis present

## 2023-09-20 DIAGNOSIS — N183 Chronic kidney disease, stage 3 unspecified: Secondary | ICD-10-CM

## 2023-09-20 LAB — COMPREHENSIVE METABOLIC PANEL
ALT: 47 U/L — ABNORMAL HIGH (ref 0–44)
AST: 275 U/L — ABNORMAL HIGH (ref 15–41)
Albumin: 2.6 g/dL — ABNORMAL LOW (ref 3.5–5.0)
Alkaline Phosphatase: 35 U/L — ABNORMAL LOW (ref 38–126)
Anion gap: 13 (ref 5–15)
BUN: 27 mg/dL — ABNORMAL HIGH (ref 8–23)
CO2: 20 mmol/L — ABNORMAL LOW (ref 22–32)
Calcium: 7.5 mg/dL — ABNORMAL LOW (ref 8.9–10.3)
Chloride: 106 mmol/L (ref 98–111)
Creatinine, Ser: 3.21 mg/dL — ABNORMAL HIGH (ref 0.61–1.24)
GFR, Estimated: 19 mL/min — ABNORMAL LOW (ref >60)
Glucose, Bld: 172 mg/dL — ABNORMAL HIGH (ref 70–99)
Potassium: 3.2 mmol/L — ABNORMAL LOW (ref 3.5–5.1)
Sodium: 139 mmol/L (ref 135–145)
Total Bilirubin: 1 mg/dL (ref 0.3–1.2)
Total Protein: 5.6 g/dL — ABNORMAL LOW (ref 6.5–8.1)

## 2023-09-20 LAB — TYPE AND SCREEN
ABO/RH(D): O POS
Antibody Screen: NEGATIVE
Unit division: 0
Unit division: 0

## 2023-09-20 LAB — BPAM FFP
Blood Product Expiration Date: 202411012359
Blood Product Expiration Date: 202411022359
ISSUE DATE / TIME: 202410292018
ISSUE DATE / TIME: 202410292018
Unit Type and Rh: 6200
Unit Type and Rh: 6200

## 2023-09-20 LAB — TROPONIN I (HIGH SENSITIVITY)
Troponin I (High Sensitivity): 55 ng/L — ABNORMAL HIGH (ref <18)
Troponin I (High Sensitivity): 76 ng/L — ABNORMAL HIGH (ref <18)
Troponin I (High Sensitivity): 163 ng/L (ref <18)
Troponin I (High Sensitivity): 217 ng/L (ref <18)

## 2023-09-20 LAB — LACTIC ACID, PLASMA
Lactic Acid, Venous: 1.1 mmol/L (ref 0.5–1.9)
Lactic Acid, Venous: 1.8 mmol/L (ref 0.5–1.9)
Lactic Acid, Venous: 1.8 mmol/L (ref 0.5–1.9)
Lactic Acid, Venous: 2 mmol/L (ref 0.5–1.9)
Lactic Acid, Venous: 2.2 mmol/L (ref 0.5–1.9)
Lactic Acid, Venous: 2.2 mmol/L (ref 0.5–1.9)
Lactic Acid, Venous: 2.8 mmol/L (ref 0.5–1.9)

## 2023-09-20 LAB — PREPARE FRESH FROZEN PLASMA: Unit division: 0

## 2023-09-20 LAB — CBC
HCT: 36 % — ABNORMAL LOW (ref 39.0–52.0)
Hemoglobin: 12.3 g/dL — ABNORMAL LOW (ref 13.0–17.0)
MCH: 30.1 pg (ref 26.0–34.0)
MCHC: 34.2 g/dL (ref 30.0–36.0)
MCV: 88 fL (ref 80.0–100.0)
Platelets: 170 10*3/uL (ref 150–400)
RBC: 4.09 MIL/uL — ABNORMAL LOW (ref 4.22–5.81)
RDW: 13.8 % (ref 11.5–15.5)
WBC: 10.2 10*3/uL (ref 4.0–10.5)
nRBC: 0 % (ref 0.0–0.2)

## 2023-09-20 LAB — BPAM RBC
Blood Product Expiration Date: 202411202359
Blood Product Expiration Date: 202411302359
ISSUE DATE / TIME: 202410291905
ISSUE DATE / TIME: 202410291921
Unit Type and Rh: 5100
Unit Type and Rh: 5100

## 2023-09-20 LAB — MAGNESIUM: Magnesium: 2.1 mg/dL (ref 1.7–2.4)

## 2023-09-20 LAB — BRAIN NATRIURETIC PEPTIDE: B Natriuretic Peptide: 983.3 pg/mL — ABNORMAL HIGH (ref 0.0–100.0)

## 2023-09-20 LAB — TSH: TSH: 1.711 u[IU]/mL (ref 0.350–4.500)

## 2023-09-20 LAB — PREPARE RBC (CROSSMATCH)

## 2023-09-20 LAB — CK
Total CK: 5852 U/L — ABNORMAL HIGH (ref 49–397)
Total CK: 19782 U/L — ABNORMAL HIGH (ref 49–397)
Total CK: 17180 U/L — ABNORMAL HIGH (ref 49–397)

## 2023-09-20 MED ORDER — DEXTROSE IN LACTATED RINGERS 5 % IV SOLN: INTRAVENOUS | Status: DC

## 2023-09-20 MED ORDER — ACETAMINOPHEN 650 MG RE SUPP: 650.0000 mg | Freq: Four times a day (QID) | RECTAL | Status: DC | PRN

## 2023-09-20 MED ORDER — AMIODARONE HCL IN DEXTROSE 360-4.14 MG/200ML-% IV SOLN
60.0000 mg/h | INTRAVENOUS | Status: DC
  Administered 2023-09-20: 60 mg/h via INTRAVENOUS
  Filled 2023-09-20 (×2): qty 200

## 2023-09-20 MED ORDER — HEPARIN (PORCINE) 25000 UT/250ML-% IV SOLN
1200.0000 [IU]/h | INTRAVENOUS | Status: DC
  Administered 2023-09-20: 1200 [IU]/h via INTRAVENOUS
  Filled 2023-09-20: qty 250

## 2023-09-20 MED ORDER — SODIUM CHLORIDE 0.9 % IV SOLN: 250.0000 mL | INTRAVENOUS | Status: AC | PRN

## 2023-09-20 MED ORDER — SODIUM CHLORIDE 0.9% FLUSH: 3.0000 mL | INTRAVENOUS | Status: DC | PRN

## 2023-09-20 MED ORDER — SODIUM CHLORIDE 0.9 % IV BOLUS
500.0000 mL | INTRAVENOUS | Status: AC
  Administered 2023-09-20: 500 mL via INTRAVENOUS

## 2023-09-20 MED ORDER — POTASSIUM CHLORIDE 20 MEQ PO PACK
40.0000 meq | PACK | Freq: Once | ORAL | Status: AC
  Administered 2023-09-20: 40 meq via ORAL
  Filled 2023-09-20: qty 2

## 2023-09-20 MED ORDER — SODIUM CHLORIDE 0.9% IV SOLUTION: Freq: Once | INTRAVENOUS | Status: DC

## 2023-09-20 MED ORDER — SODIUM CHLORIDE 0.9 % IV SOLN: INTRAVENOUS | Status: AC

## 2023-09-20 MED ORDER — HYDROMORPHONE HCL 1 MG/ML IJ SOLN
0.5000 mg | INTRAMUSCULAR | Status: DC | PRN
  Administered 2023-09-20: 0.5 mg via INTRAVENOUS
  Filled 2023-09-20: qty 1

## 2023-09-20 MED ORDER — SODIUM CHLORIDE 0.9 % IV SOLN: INTRAVENOUS | Status: DC

## 2023-09-20 MED ORDER — SENNOSIDES-DOCUSATE SODIUM 8.6-50 MG PO TABS: 1.0000 | ORAL_TABLET | Freq: Every evening | ORAL | Status: DC | PRN

## 2023-09-20 MED ORDER — MIDODRINE HCL 5 MG PO TABS: 5.0000 mg | ORAL_TABLET | Freq: Three times a day (TID) | ORAL | Status: DC

## 2023-09-20 MED ORDER — ROSUVASTATIN CALCIUM 20 MG PO TABS: 40.0000 mg | ORAL_TABLET | Freq: Every day | ORAL | Status: DC

## 2023-09-20 MED ORDER — ONDANSETRON HCL 4 MG/2ML IJ SOLN
4.0000 mg | Freq: Four times a day (QID) | INTRAMUSCULAR | Status: DC | PRN
  Administered 2023-09-20 – 2023-10-24 (×11): 4 mg via INTRAVENOUS
  Filled 2023-09-20 (×11): qty 2

## 2023-09-20 MED ORDER — ACETAMINOPHEN 325 MG PO TABS
650.0000 mg | ORAL_TABLET | Freq: Four times a day (QID) | ORAL | Status: DC | PRN
  Administered 2023-09-20 – 2023-10-02 (×6): 650 mg via ORAL
  Filled 2023-09-20 (×7): qty 2

## 2023-09-20 MED ORDER — APIXABAN 5 MG PO TABS
5.0000 mg | ORAL_TABLET | Freq: Two times a day (BID) | ORAL | Status: DC
  Administered 2023-09-20 – 2023-09-21 (×3): 5 mg via ORAL
  Filled 2023-09-20 (×3): qty 1

## 2023-09-20 MED ORDER — HYDROMORPHONE HCL 1 MG/ML IJ SOLN
0.5000 mg | INTRAMUSCULAR | Status: DC | PRN
  Administered 2023-09-20: 1 mg via INTRAVENOUS
  Filled 2023-09-20: qty 1

## 2023-09-20 MED ORDER — AMIODARONE HCL IN DEXTROSE 360-4.14 MG/200ML-% IV SOLN
30.0000 mg/h | INTRAVENOUS | Status: DC
  Administered 2023-09-20 – 2023-09-22 (×5): 30 mg/h via INTRAVENOUS
  Filled 2023-09-20 (×3): qty 200

## 2023-09-20 MED ORDER — NITROGLYCERIN 0.4 MG SL SUBL: 0.4000 mg | SUBLINGUAL_TABLET | SUBLINGUAL | Status: DC | PRN

## 2023-09-20 MED ORDER — AMIODARONE LOAD VIA INFUSION
150.0000 mg | Freq: Once | INTRAVENOUS | Status: AC
  Administered 2023-09-20: 150 mg via INTRAVENOUS
  Filled 2023-09-20: qty 83.34

## 2023-09-20 MED ORDER — SODIUM CHLORIDE 0.9% FLUSH
3.0000 mL | Freq: Two times a day (BID) | INTRAVENOUS | Status: DC
  Administered 2023-09-20 – 2023-10-01 (×22): 3 mL via INTRAVENOUS
  Administered 2023-10-02: 10 mL via INTRAVENOUS
  Administered 2023-10-02 – 2023-10-17 (×28): 3 mL via INTRAVENOUS

## 2023-09-20 MED ORDER — METOPROLOL TARTRATE 25 MG PO TABS: 12.5000 mg | ORAL_TABLET | Freq: Two times a day (BID) | ORAL | Status: DC

## 2023-09-20 MED ORDER — POTASSIUM CHLORIDE CRYS ER 20 MEQ PO TBCR
40.0000 meq | EXTENDED_RELEASE_TABLET | Freq: Once | ORAL | Status: AC
  Administered 2023-09-20: 40 meq via ORAL
  Filled 2023-09-20: qty 2

## 2023-09-20 MED ORDER — CLOPIDOGREL BISULFATE 75 MG PO TABS: 75.0000 mg | ORAL_TABLET | Freq: Every day | ORAL | Status: DC

## 2023-09-20 NOTE — Progress Notes (Signed)
Elevated troponin secondary to demand ischemia in setting of A-fib RVR. - Initial troponin 55 which has been trended up to 76.  Elevated troponin in the setting of demand ischemia in the context of A-fib RVR and hypotension. -Patient denies any chest pain.  EKG showed atrial fibrillation. - Continue to trend troponin. - Patient was on heparin drip which has been hold around 5:15 AM today as general surgery concern for left-sided knee swelling has been worsening and recommended to hold the heparin drip until orthopedic surgeon evaluate the patient. -Continue to trend troponin. - Continue cardiac monitoring.

## 2023-09-20 NOTE — Assessment & Plan Note (Addendum)
Continue blood pressure monitoring.  Continue metoprolol.  Holding amlodipine and entresto until renal function and blood pressure more stable.

## 2023-09-20 NOTE — Evaluation (Signed)
Physical Therapy Evaluation Patient Details Name: Jorge West MRN: [id number] DOB: 07/21/1946 Today's Date: 09/20/2023  History of Present Illness  77 yo male admitted 10/29 with Left tibial plateau fx, medial femoral condyle avulsion fx, fibular head fx, lipohemarthrosis, suspected ligamentous injury. T12 anterior wedge compression fracture. He was changing a tire on is car and it fell on his leg.  Additionally, pt with atrial fibrillation with RVR, rhabdomyolysis, and acute on chronic diastolic CHF.   PMH: heart failure, atrial fibrillation, coronary artery disease, paroxysmal atrial fibrillation, hypertension, CKD and BPH    Clinical Impression  Pt admitted with above diagnosis. PTA very active, works as a Education officer, community. Fully independent, attends cardiac rehab program and [place]. Awaiting hinge brace, utilized KI today for knee protection. Reviewed mobility techniques. Required Mod assist to rise to EOB and stand with RW. Reports SOB however RR 16, HR 105-120 throughout session, and SpO2 96% on RA.  Patient will benefit from intensive inpatient follow up therapy, >3 hours/day. Wife present, supportive, can provide 24/7 supervision at d/c. Pt currently with functional limitations due to the deficits listed below (see PT Problem List). Pt will benefit from acute skilled PT to increase their independence and safety with mobility to allow discharge.           If plan is discharge home, recommend the following: A lot of help with walking and/or transfers;A lot of help with bathing/dressing/bathroom;Assistance with cooking/housework;Assist for transportation;Help with stairs or ramp for entrance   Can travel by private vehicle        Equipment Recommendations  (TBD next venue)  Recommendations for Other Services  Rehab consult    Functional Status Assessment Patient has had a recent decline in their functional status and demonstrates the ability to make significant  improvements in function in a reasonable and predictable amount of time.     Precautions / Restrictions Precautions Precautions: Fall;Knee;Back Precaution Comments: Lt knee tibial, femoral condyle, and radial head fx. MCL avulsion, likely ACL tear. Required Braces or Orthoses: Knee Immobilizer - Left;Other Brace Knee Immobilizer - Left: On except when in CPM (or working with PT/OT) Other Brace: Hinge brace has been ordered Restrictions Weight Bearing Restrictions: Yes LLE Weight Bearing: Weight bearing as tolerated (with hinge brace on)      Mobility  Bed Mobility Overal bed mobility: Needs Assistance Bed Mobility: Rolling, Sidelying to Sit Rolling: Mod assist, Used rails Sidelying to sit: Mod assist, Used rails, HOB elevated       General bed mobility comments: Log roll not well tolerated, mostly assisted to spin on bed pad, mod assist able to pull through therapist's hand to rise to EOB. Cues for technique, assisted with LLE in KI. Returning to bed instructed pt to use RLE to assist with LLE.    Transfers Overall transfer level: Needs assistance   Transfers: Sit to/from Stand Sit to Stand: Mod assist           General transfer comment: Mod assist for boost to stand from low bed setting, cues for Rt hand placement on surface of bed to rise, LUE holding RW for support. minimal WB through LLE in [place]. Tolerates full weight through UEs and RLE but reports SOB and sat back down within 2 mins (VSS throughout with SpO2 96% on RA, RR 16, HR 105-120.) Able to scoot and squat along bed towards HOB with CGA.    Ambulation/Gait               General  Gait Details: Deferred due to complaints of SOB and feeling he could not stand any longer (wife reports he has panic attacks)  Stairs            Wheelchair Mobility     Tilt Bed    Modified Rankin (Stroke Patients Only)       Balance Overall balance assessment: Needs assistance Sitting-balance support: No upper  extremity supported, Feet supported Sitting balance-Leahy Scale: Fair     Standing balance support: Bilateral upper extremity supported Standing balance-Leahy Scale: Poor Standing balance comment: Stands with CGA BIL UE support.                             Pertinent Vitals/Pain Pain Assessment Pain Assessment: 0-10 Pain Score: 4  Pain Location: Lt knee and back Pain Descriptors / Indicators: Aching Pain Intervention(s): Monitored during session, Repositioned, Limited activity within patient's tolerance, Premedicated before session    Home Living Family/patient expects to be discharged to:: Private residence Living Arrangements: Spouse/significant other Available Help at Discharge: Family;Available 24 hours/day Type of Home: House Home Access: Stairs to enter Entrance Stairs-Rails: Right Entrance Stairs-Number of Steps: 3-4   Home Layout: One level;Able to live on main level with bedroom/bathroom;Laundry or work area in basement;Full bath on main level Home Equipment: Rolling Walker (2 wheels);Shower seat - built in;Hand held shower head;Grab bars - tub/shower      Prior Function Prior Level of Function : Independent/Modified Independent;Driving;Working/employed             Mobility Comments: Ind, farmer - livestock ADLs Comments: ind     Extremity/Trunk Assessment   Upper Extremity Assessment Upper Extremity Assessment: Defer to OT evaluation    Lower Extremity Assessment Lower Extremity Assessment: LLE deficits/detail LLE Deficits / Details: Edematous, KI adjusted appropriately. Light touch diminished in distal LE but only slightly. LLE: Unable to fully assess due to pain;Unable to fully assess due to immobilization       Communication   Communication Communication: No apparent difficulties  Cognition Arousal: Alert Behavior During Therapy: WFL for tasks assessed/performed Overall Cognitive Status: Within Functional Limits for tasks assessed                                           General Comments      Exercises General Exercises - Lower Extremity Ankle Circles/Pumps: AROM, Both, 10 reps, Supine Quad Sets: AROM, Both, 10 reps, Supine   Assessment/Plan    PT Assessment Patient needs continued PT services  PT Problem List Decreased strength;Decreased range of motion;Decreased activity tolerance;Decreased balance;Decreased mobility;Decreased knowledge of use of DME;Decreased knowledge of precautions;Cardiopulmonary status limiting activity;Impaired sensation;Pain       PT Treatment Interventions DME instruction;Gait training;Stair training;Functional mobility training;Therapeutic activities;Therapeutic exercise;Balance training;Neuromuscular re-education;Patient/family education;Wheelchair mobility training;Modalities    PT Goals (Current goals can be found in the Care Plan section)  Acute Rehab PT Goals Patient Stated Goal: Get well, more independent before returning home. PT Goal Formulation: With patient/family Time For Goal Achievement: 10/04/23 Potential to Achieve Goals: Good    Frequency Min 1X/week     Co-evaluation               AM-PAC PT "6 Clicks" Mobility  Outcome Measure Help needed turning from your back to your side while in a flat bed without using bedrails?: A Lot Help needed  moving from lying on your back to sitting on the side of a flat bed without using bedrails?: A Lot Help needed moving to and from a bed to a chair (including a wheelchair)?: A Lot Help needed standing up from a chair using your arms (e.g., wheelchair or bedside chair)?: A Lot Help needed to walk in hospital room?: A Lot Help needed climbing 3-5 steps with a railing? : Total 6 Click Score: 11    End of Session Equipment Utilized During Treatment: Gait belt;Left knee immobilizer Activity Tolerance: Patient tolerated treatment well Patient left: in bed;with call bell/phone within reach;with bed alarm  set;with family/visitor present;with SCD's reapplied Nurse Communication: Mobility status;Precautions;Weight bearing status PT Visit Diagnosis: Unsteadiness on feet (R26.81);Muscle weakness (generalized) (M62.81);Difficulty in walking, not elsewhere classified (R26.2);Pain Pain - Right/Left: Left Pain - part of body: Knee (and back)    Time: 7846-9629 PT Time Calculation (min) (ACUTE ONLY): 43 min   Charges:   PT Evaluation $PT Eval Moderate Complexity: 1 Mod PT Treatments $Therapeutic Activity: 23-37 mins PT General Charges $$ ACUTE PT VISIT: 1 Visit         Kathlyn [place], PT, DPT [facility] Physical Therapist Office: [phone] Website: [email or web address]   Berton Mount 09/20/2023, 5:38 PM

## 2023-09-20 NOTE — Assessment & Plan Note (Addendum)
T12 compression fracture.   Follow up left lower extremity CT  Diffuse soft tissue swelling throughout the thigh with ill defined subcutaneous hematoma about the knee. There is small volume hemorrhage tracking along the deep fascial planes of the posterior compartment of the distal thigh.   Hold on direct oral anticoagulant for now.   Continue pain control and physical therapy.
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
## 2023-09-20 NOTE — ED Notes (Signed)
Pt returned back to ED from CT

## 2023-09-20 NOTE — ED Notes (Signed)
Ortho tech notified pt is back from MRI and ready for knee immobilizer placement

## 2023-09-20 NOTE — Progress Notes (Signed)
Orthopedic Tech Progress Note Patient Details:  Jorge West August 05, 1946 [id number]  Patient ID: [name], male   DOB: Feb 19, 1946, 77 y.o.   MRN: [id number] Waiting on patients MRI to be completed before applying KI.   [place] A Leoncio Hansen 09/20/2023, 1:46 AM

## 2023-09-20 NOTE — Progress Notes (Signed)
Orthopedic Tech Progress Note Patient Details:  LUIZ ANGELOS 1946-05-31 [id number]  Ortho Devices Type of Ortho Device: Knee Immobilizer Ortho Device/Splint Location: LLE Ortho Device/Splint Interventions: Ordered, Application, Adjustment   Post Interventions Patient Tolerated: Well Instructions Provided: Care of device Blistering noted to medial thigh, covered with abd pad to protect skin. Previous note deleted per MD request due to typo of "RLE" when it was supposed to be "LLE". Darleen Crocker 09/20/2023, 6:24 AM

## 2023-09-20 NOTE — ED Notes (Signed)
Patient transported to MRI. Per Dr. Tereasa Coop, heparin drip can be paused until the completion of MRI then restarted once pt arrives back to unit.

## 2023-09-20 NOTE — Progress Notes (Signed)
Pt BP: 80/63 (Map 71) , pt asymptomatic. Amio gtt stopped per provider request. See new orders. Rapid RN and provider have come to bedside. Pt asymptomatic and has no concerns at this time. Yellow mews criteria implemented.   09/20/23 2300  Vitals  Temp 97.7 F (36.5 C)  Temp Source Oral  BP (!) 80/63  MAP (mmHg) 71  BP Location Left Arm  BP Method Automatic  Patient Position (if appropriate) Lying  Pulse Rate 93  Pulse Rate Source Monitor  ECG Heart Rate 98  Resp 15  Level of Consciousness  Level of Consciousness Alert  MEWS COLOR  MEWS Score Color Yellow  Oxygen Therapy  SpO2 93 %  O2 Device Room Air  MEWS Score  MEWS Temp 0  MEWS Systolic 2  MEWS Pulse 0  MEWS RR 0  MEWS LOC 0  MEWS Score 2  Provider Notification  Provider Name/Title Ingram MD  Date Provider Notified 09/20/23  Time Provider Notified 2305  Method of Notification Page  Provider response At bedside  Date of Provider Response 09/20/23  Time of Provider Response 2305  Rapid Response Notification  Name of Rapid Response RN Notified Lowella Bandy RN (came to bed side)  Date Rapid Response Notified 09/20/23  Time Rapid Response Notified 2344

## 2023-09-20 NOTE — ED Notes (Signed)
Tereasa Coop MD requested RN stop Heparin drip. RN paused drip .

## 2023-09-20 NOTE — ED Notes (Signed)
Patient transported to CT 

## 2023-09-20 NOTE — Consult Note (Signed)
Reason for Consult:Right knee derangement Referring Physician: Delrae Sawyers Arrien Time called: 0730 Time at bedside: 0915   Jorge West is an 77 y.o. male.  HPI: Benita Gutter was working on his car when it came off the jack and backed onto his left leg. He was trapped there for a couple of hours. He was brought to the ED where x-rays showed avulsion fxs consistent with ligamentous injury. He was admitted and orthopedic surgery was consulted. He lives at home with his wife.  Past Medical History:  Diagnosis Date   Atrial fibrillation [facility])    Had an ablation done   Atypical mole 06/14/2000   moderate/marked on mid back [facility])   Atypical mole 11/29/2001   slight/moderate on right shoulder Nino Glow)   Atypical mole 11/29/2001   moderate on right lateral abdomen [facility]ino Glow)   Atypical mole 11/29/2001   moderate on right forearm [facility]ino Glow)   Atypical mole 01/11/2007   moderate on lower mid back   Atypical mole 07/14/2008   SK and atypical solar lentigo on left jawline ([facility])   Basal cell carcinoma 07/29/1996   back left lower ear - CX3+excision   Basal cell carcinoma 09/22/2004   basosquamous on right tip of nose (MOHs)   Basal cell carcinoma 10/20/2005   superficial on upper center back - CX3+5FU   Basal cell carcinoma 10/20/2011   right upper back - tx p bx   Basal cell carcinoma 02/07/2018   superficial/nodular on left upper arm - CX3+cautery+5FU   Basal cell carcinoma 10/23/2018   [facility])   Benign prostate hyperplasia    Cataract    left and removed   Depression    Depression    Diverticulosis    Erectile dysfunction    Hyperlipidemia    Hypertension    Impaired fasting glucose    Nocturia    Personal history of colonic polyps 04/19/2005   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   Left Temporal Scalp (well diff)   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   left post auricular - clear at visit on 12/28/2001    Squamous cell carcinoma of skin 10/08/2003   well differentiated below outer left eye - MOHs   Squamous cell carcinoma of skin 06/02/2008   well differentiated behind left ear   Squamous cell carcinoma of skin 10/20/2011   KA on left elbow   Squamous cell carcinoma of skin 03/02/2015   well differentiated on right outer cheek - CX3+excision   Squamous cell carcinoma of skin 03/01/2017   well differentiated on left forearm - tx p bx   Squamous cell carcinoma of skin 03/07/2018   moderately differentiated on left jawline - CX3+excision    Past Surgical History:  Procedure Laterality Date   CATARACT EXTRACTION     COLONOSCOPY     OPEN REDUCTION INTERNAL FIXATION (ORIF) METACARPAL  09/24/2012   Procedure: OPEN REDUCTION INTERNAL FIXATION (ORIF) METACARPAL;  Surgeon: Sharma Covert, MD;  Location: [facility] OR;  Service: Orthopedics;  Laterality: Left;  and proximal phalanges.   surgery for skin cancer      Family History  Problem Relation Age of Onset   Cancer Other        family history    Colon cancer Neg Hx    Colon polyps Neg Hx    Rectal cancer Neg Hx    Stomach cancer Neg Hx     Social History:  reports that he has never smoked. He has quit using  smokeless tobacco. He reports that he does not currently use alcohol. He reports that he does not use drugs.  Allergies:  Allergies  Allergen Reactions   Terazosin Hcl     Dizziness and hypotension    Medications: I have reviewed the patient's current medications.  Results for orders placed or performed during the hospital encounter of 09/19/23 (from the past 48 hour(s))  Type and screen Ordered by PROVIDER DEFAULT     Status: None   Collection Time: 09/19/23  7:12 PM  Result Value Ref Range   ABO/RH(D) O POS    Antibody Screen NEG    Sample Expiration 09/22/2023,2359    Unit Number [id number]    Blood Component Type RED CELLS,LR    Unit division 00    Status of Unit ISSUED,FINAL    Transfusion Status OK TO TRANSFUSE     Crossmatch Result COMPATIBLE    Unit Number [id number]    Blood Component Type RED CELLS,LR    Unit division 00    Status of Unit ISSUED,FINAL    Transfusion Status OK TO TRANSFUSE    Crossmatch Result COMPATIBLE   Comprehensive metabolic panel     Status: Abnormal   Collection Time: 09/19/23  7:12 PM  Result Value Ref Range   Sodium 140 135 - 145 mmol/L   Potassium 3.4 (L) 3.5 - 5.1 mmol/L   Chloride 106 98 - 111 mmol/L   CO2 21 (L) 22 - 32 mmol/L   Glucose, Bld 203 (H) 70 - 99 mg/dL    Comment: Glucose reference range applies only to samples taken after fasting for at least 8 hours.   BUN 20 8 - 23 mg/dL   Creatinine, Ser 3.08 (H) 0.61 - 1.24 mg/dL   Calcium 8.0 (L) 8.9 - 10.3 mg/dL   Total Protein 5.8 (L) 6.5 - 8.1 g/dL   Albumin 2.9 (L) 3.5 - 5.0 g/dL   AST 88 (H) 15 - 41 U/L   ALT 23 0 - 44 U/L   Alkaline Phosphatase 44 38 - 126 U/L   Total Bilirubin 1.1 0.3 - 1.2 mg/dL   GFR, Estimated 28 (L) >60 mL/min    Comment: (NOTE) Calculated using the CKD-EPI Creatinine Equation (2021)    Anion gap 13 5 - 15    Comment: Performed at [facility], [address] [place], [place] [address]  CBC     Status: Abnormal   Collection Time: 09/19/23  7:12 PM  Result Value Ref Range   WBC 16.1 (H) 4.0 - 10.5 K/uL   RBC 4.24 4.22 - 5.81 MIL/uL   Hemoglobin 12.4 (L) 13.0 - 17.0 g/dL   HCT 69.6 (L) 29.5 - 28.4 %   MCV 89.4 80.0 - 100.0 fL   MCH 29.2 26.0 - 34.0 pg   MCHC 32.7 30.0 - 36.0 g/dL   RDW 13.2 44.0 - 10.2 %   Platelets 232 150 - 400 K/uL   nRBC 0.0 0.0 - 0.2 %    Comment: Performed at [facility], [address], [place], [place] [address]  Ethanol     Status: None   Collection Time: 09/19/23  7:12 PM  Result Value Ref Range   Alcohol, Ethyl (B) <10 <10 mg/dL    Comment: (NOTE) Lowest detectable limit for serum alcohol is 10 mg/dL.  For medical purposes only. Performed at [facility], [address], [place], [place] [address]   Protime-INR      Status: Abnormal  Collection Time: 09/19/23  7:12 PM  Result Value Ref Range   Prothrombin Time 18.3 (H) 11.4 - 15.2 seconds   INR 1.5 (H) 0.8 - 1.2    Comment: (NOTE) INR goal varies based on device and disease states. Performed at [facility], [address], [place], [place] [address]   CK     Status: Abnormal   Collection Time: 09/19/23  7:12 PM  Result Value Ref Range   Total CK 7,242 (H) 49 - 397 U/L    Comment: RESULT CONFIRMED BY MANUAL DILUTION Performed at [facility], [address] [place], [place] [address]   I-Stat Chem 8, ED     Status: Abnormal   Collection Time: 09/19/23  7:18 PM  Result Value Ref Range   Sodium 142 135 - 145 mmol/L   Potassium 3.4 (L) 3.5 - 5.1 mmol/L   Chloride 103 98 - 111 mmol/L   BUN 20 8 - 23 mg/dL   Creatinine, Ser 3.32 (H) 0.61 - 1.24 mg/dL   Glucose, Bld 951 (H) 70 - 99 mg/dL    Comment: Glucose reference range applies only to samples taken after fasting for at least 8 hours.   Calcium, Ion 1.02 (L) 1.15 - 1.40 mmol/L   TCO2 21 (L) 22 - 32 mmol/L   Hemoglobin 12.2 (L) 13.0 - 17.0 g/dL   HCT 88.4 (L) 16.6 - 06.3 %  I-Stat Lactic Acid, ED     Status: Abnormal   Collection Time: 09/19/23  7:19 PM  Result Value Ref Range   Lactic Acid, Venous 4.8 (HH) 0.5 - 1.9 mmol/L   Comment NOTIFIED PHYSICIAN   ABO/Rh     Status: None   Collection Time: 09/19/23  7:22 PM  Result Value Ref Range   ABO/RH(D)      O POS Performed at [facility], [address], [place], [place] [address]   Prepare fresh frozen plasma     Status: None   Collection Time: 09/19/23  8:16 PM  Result Value Ref Range   Unit Number [id number]    Blood Component Type THW PLS APHR    Unit division 00    Status of Unit ISSUED,FINAL    Transfusion Status OK TO TRANSFUSE    Unit Number [id number]    Blood Component Type THW PLS APHR    Unit division B0    Status of Unit ISSUED,FINAL    Transfusion Status      OK TO TRANSFUSE Performed at  [facility], [address], [place], [place] [address]   CK     Status: Abnormal   Collection Time: 09/19/23 10:29 PM  Result Value Ref Range   Total CK 10,725 (H) 49 - 397 U/L    Comment: RESULT CONFIRMED BY MANUAL DILUTION Performed at [facility], [address], [place], [place] [address]   I-Stat CG4 Lactic Acid     Status: Abnormal   Collection Time: 09/19/23 10:36 PM  Result Value Ref Range   Lactic Acid, Venous 2.6 (HH) 0.5 - 1.9 mmol/L   Comment NOTIFIED PHYSICIAN   Troponin I (High Sensitivity)     Status: Abnormal   Collection Time: 09/20/23 12:56 AM  Result Value Ref Range   Troponin I (High Sensitivity) 55 (H) <18 ng/L    Comment: (NOTE) Elevated high sensitivity troponin I (hsTnI) values and significant  changes across serial measurements may suggest ACS but many other  chronic and acute  conditions are known to elevate hsTnI results.  Refer to the "Links" section for chest pain algorithms and additional  guidance. Performed at [facility], [address], [place], [place] [address]   CK     Status: Abnormal   Collection Time: 09/20/23 12:56 AM  Result Value Ref Range   Total CK 5,852 (H) 49 - 397 U/L    Comment: RESULT CONFIRMED BY MANUAL DILUTION Performed at [facility], [address], [place], [place] [address]   Magnesium     Status: None   Collection Time: 09/20/23 12:56 AM  Result Value Ref Range   Magnesium 2.1 1.7 - 2.4 mg/dL    Comment: Performed at [facility], [address] [place], [place] [address]  TSH     Status: None   Collection Time: 09/20/23 12:56 AM  Result Value Ref Range   TSH 1.711 0.350 - 4.500 uIU/mL    Comment: Performed by a 3rd Generation assay with a functional sensitivity of <=0.01 uIU/mL. Performed at [facility], [address], [place], [place] [address]   Lactic acid, plasma     Status: Abnormal   Collection Time: 09/20/23 12:56 AM  Result Value Ref Range   Lactic Acid,  Venous 2.2 (HH) 0.5 - 1.9 mmol/L    Comment: CRITICAL RESULT CALLED TO, READ BACK BY AND VERIFIED WITH Cruzita Lederer RN 09/20/23 @0156  BY J.WHITE Performed at [facility] [place] [facility], [address] [place], [place] [address]   Troponin I (High Sensitivity)     Status: Abnormal   Collection Time: 09/20/23  2:47 AM  Result Value Ref Range   Troponin I (High Sensitivity) 76 (H) <18 ng/L    Comment: RESULT CALLED TO, READ BACK BY AND VERIFIED WITH REYNOLDS, A. RN @ [phone] 09/20/23 JBUTLER (NOTE) Elevated high sensitivity troponin I (hsTnI) values and significant  changes across serial measurements may suggest ACS but many other  chronic and acute conditions are known to elevate hsTnI results.  Refer to the "Links" section for chest pain algorithms and additional  guidance. Performed at [facility], [address] [place], [place] [address]   Lactic acid, plasma     Status: None   Collection Time: 09/20/23  5:00 AM  Result Value Ref Range   Lactic Acid, Venous 1.8 0.5 - 1.9 mmol/L    Comment: Performed at [facility], [address] [place], [place] [address]  Troponin I (High Sensitivity)     Status: Abnormal   Collection Time: 09/20/23  6:18 AM  Result Value Ref Range   Troponin I (High Sensitivity) 163 (HH) <18 ng/L    Comment: CRITICAL RESULT CALLED TO, READ BACK BY AND VERIFIED WITH Ardell Isaacs, RN [phone] 09/20/23 L. KLAR (NOTE) Elevated high sensitivity troponin I (hsTnI) values and significant  changes across serial measurements may suggest ACS but many other  chronic and acute conditions are known to elevate hsTnI results.  Refer to the "Links" section for chest pain algorithms and additional  guidance. Performed at [facility], [address] [place], [place] [address]   Prepare RBC     Status: None   Collection Time: 09/20/23  7:26 AM  Result Value Ref Range   Order Confirmation      ORDER PROCESSED BY BLOOD BANK Performed at [facility], [address] [place], [place] [address]   CK     Status: Abnormal  Collection Time: 09/20/23  7:52 AM  Result Value Ref Range   Total CK 19,782 (H) 49 - 397 U/L    Comment: RESULT CONFIRMED BY MANUAL DILUTION Performed at [facility], [address] [place], [place] [address]   Lactic acid, plasma     Status: None   Collection Time: 09/20/23  7:52 AM  Result Value Ref Range   Lactic Acid, Venous 1.8 0.5 - 1.9 mmol/L    Comment: Performed at [facility], [address], [place], [place] [address]  Comprehensive metabolic panel     Status: Abnormal   Collection Time: 09/20/23  7:52 AM  Result Value Ref Range   Sodium 139 135 - 145 mmol/L   Potassium 3.2 (L) 3.5 - 5.1 mmol/L   Chloride 106 98 - 111 mmol/L   CO2 20 (L) 22 - 32 mmol/L   Glucose, Bld 172 (H) 70 - 99 mg/dL    Comment: Glucose reference range applies only to samples taken after fasting for at least 8 hours.   BUN 27 (H) 8 - 23 mg/dL   Creatinine, Ser 2.95 (H) 0.61 - 1.24 mg/dL   Calcium 7.5 (L) 8.9 - 10.3 mg/dL   Total Protein 5.6 (L) 6.5 - 8.1 g/dL   Albumin 2.6 (L) 3.5 - 5.0 g/dL   AST 621 (H) 15 - 41 U/L   ALT 47 (H) 0 - 44 U/L   Alkaline Phosphatase 35 (L) 38 - 126 U/L   Total Bilirubin 1.0 0.3 - 1.2 mg/dL   GFR, Estimated 19 (L) >60 mL/min    Comment: (NOTE) Calculated using the CKD-EPI Creatinine Equation (2021)    Anion gap 13 5 - 15    Comment: Performed at [facility], [address] [place], [place] [address]  CBC     Status: Abnormal   Collection Time: 09/20/23  7:52 AM  Result Value Ref Range   WBC 10.2 4.0 - 10.5 K/uL   RBC 4.09 (L) 4.22 - 5.81 MIL/uL   Hemoglobin 12.3 (L) 13.0 - 17.0 g/dL   HCT 78.4 (L) 69.6 - 29.5 %   MCV 88.0 80.0 - 100.0 fL   MCH 30.1 26.0 - 34.0 pg   MCHC 34.2 30.0 - 36.0 g/dL   RDW 28.4 13.2 - 44.0 %   Platelets 170 150 - 400 K/uL   nRBC 0.0 0.0 - 0.2 %    Comment: Performed at [facility], [address], [place], [place] [address]  Brain  natriuretic peptide     Status: Abnormal   Collection Time: 09/20/23  7:52 AM  Result Value Ref Range   B Natriuretic Peptide 983.3 (H) 0.0 - 100.0 pg/mL    Comment: Performed at [facility], [address], [place], [place] [address]  Troponin I (High Sensitivity)     Status: Abnormal   Collection Time: 09/20/23  7:52 AM  Result Value Ref Range   Troponin I (High Sensitivity) 217 (HH) <18 ng/L    Comment: CRITICAL VALUE NOTED. VALUE IS CONSISTENT WITH PREVIOUSLY REPORTED/CALLED VALUE (NOTE) Elevated high sensitivity troponin I (hsTnI) values and significant  changes across serial measurements may suggest ACS but many other  chronic and acute conditions are known to elevate hsTnI results.  Refer to the "Links" section for chest pain algorithms and additional  guidance. Performed at [facility], [address], [place], [place] [address]     CT KNEE LEFT WO CONTRAST  Result Date: 09/20/2023 CLINICAL DATA:  Rule out fracture.  Car rolled over leg. EXAM: CT OF THE LEFT KNEE WITHOUT CONTRAST TECHNIQUE: Multidetector CT imaging of the left knee was performed according to the standard protocol. Multiplanar CT image reconstructions were also generated. RADIATION DOSE REDUCTION: This exam was performed according to the departmental dose-optimization program which includes automated exposure control, adjustment of the mA and/or kV according to patient size and/or use of iterative reconstruction technique. COMPARISON:  None Available. FINDINGS: Bones/Joint/Cartilage Small fracture fragments adjacent to the medial femoral condyle may be due to avulsion fracture. Additional comminuted minimally displaced fracture of the central tibial plateau and tibial tubercles. Nondisplaced fracture of the anterior cortex of the proximal fibular head. Moderate lipohemarthrosis. Ligaments Suboptimally assessed by CT. Muscles and Tendons No acute abnormality. Soft tissues Soft tissue swelling about the  knee.  Vascular calcifications. IMPRESSION: 1. Comminuted minimally displaced fracture of the central tibial plateau and tibial tubercles. 2. Small fracture fragments adjacent to the medial femoral condyle may be due to avulsion fracture. 3. Nondisplaced fracture of the fibular head. 4. Moderate lipohemarthrosis. Electronically Signed   By: Minerva Fester M.D.   On: 09/20/2023 03:12   DG Knee Left Port  Result Date: 09/20/2023 CLINICAL DATA:  Rule out fracture.  Car rolled over leg. EXAM: PORTABLE LEFT KNEE - 1-2 VIEW COMPARISON:  None Available. FINDINGS: Acute mildly displaced fracture of the central tibial plateau/tibial tubercles. Additional mildly displaced fracture of the medial femoral condyle. Small knee joint effusion. IMPRESSION: Acute mildly displaced fractures of the central tibial plateau/tibial tubercles. Mildly displaced fracture of the medial femoral condyle. Electronically Signed   By: Minerva Fester M.D.   On: 09/20/2023 03:06   CT HEAD WO CONTRAST ( )  Result Date: 09/19/2023 CLINICAL DATA:  Head trauma, minor (Age >= 65y); Neck trauma (Age >= 65y) EXAM: CT HEAD WITHOUT CONTRAST CT CERVICAL SPINE WITHOUT CONTRAST TECHNIQUE: Multidetector CT imaging of the head and cervical spine was performed following the standard protocol without intravenous contrast. Multiplanar CT image reconstructions of the cervical spine were also generated. RADIATION DOSE REDUCTION: This exam was performed according to the departmental dose-optimization program which includes automated exposure control, adjustment of the mA and/or kV according to patient size and/or use of iterative reconstruction technique. COMPARISON:  None Available. FINDINGS: CT HEAD FINDINGS Brain: No evidence of large-territorial acute infarction. No parenchymal hemorrhage. No mass lesion. No extra-axial collection. No mass effect or midline shift. No hydrocephalus. Basilar cisterns are patent. Vascular: No hyperdense vessel. Skull: No  acute fracture or focal lesion. Sinuses/Orbits: Paranasal sinuses and mastoid air cells are clear. The orbits are unremarkable. Other: None. CT CERVICAL SPINE FINDINGS Alignment: Straightening of the normal cervical lordosis likely due to positioning and degenerative changes. Skull base and vertebrae: Multilevel moderate degenerative changes spine. No severe osseous neural foraminal or central canal stenosis. No acute fracture. No aggressive appearing focal osseous lesion or focal pathologic process. Soft tissues and spinal canal: No prevertebral fluid or swelling. No visible canal hematoma. Upper chest: Unremarkable. Other: 1.9 x 1 cm right thyroid gland hypodense nodule. IMPRESSION: 1. No acute intracranial abnormality. 2. No acute displaced fracture or traumatic listhesis of the cervical spine. 3. A 1.9 cm right thyroid gland nodule. Recommend thyroid US (ref: J Am Coll Radiol. 2015 Feb;12(2): 143-50). These results were called by telephone at the time of interpretation on 09/19/2023 at 8:04 pm to provider [facility] , who verbally acknowledged these results. Electronically Signed   By: Tish Frederickson  M.D.   On: 09/19/2023 20:43   CT CERVICAL SPINE WO CONTRAST  Result Date: 09/19/2023 CLINICAL DATA:  Head trauma, minor (Age >= 65y); Neck trauma (Age >= 65y) EXAM: CT HEAD WITHOUT CONTRAST CT CERVICAL SPINE WITHOUT CONTRAST TECHNIQUE: Multidetector CT imaging of the head and cervical spine was performed following the standard protocol without intravenous contrast. Multiplanar CT image reconstructions of the cervical spine were also generated. RADIATION DOSE REDUCTION: This exam was performed according to the departmental dose-optimization program which includes automated exposure control, adjustment of the mA and/or kV according to patient size and/or use of iterative reconstruction technique. COMPARISON:  None Available. FINDINGS: CT HEAD FINDINGS Brain: No evidence of large-territorial acute infarction.  No parenchymal hemorrhage. No mass lesion. No extra-axial collection. No mass effect or midline shift. No hydrocephalus. Basilar cisterns are patent. Vascular: No hyperdense vessel. Skull: No acute fracture or focal lesion. Sinuses/Orbits: Paranasal sinuses and mastoid air cells are clear. The orbits are unremarkable. Other: None. CT CERVICAL SPINE FINDINGS Alignment: Straightening of the normal cervical lordosis likely due to positioning and degenerative changes. Skull base and vertebrae: Multilevel moderate degenerative changes spine. No severe osseous neural foraminal or central canal stenosis. No acute fracture. No aggressive appearing focal osseous lesion or focal pathologic process. Soft tissues and spinal canal: No prevertebral fluid or swelling. No visible canal hematoma. Upper chest: Unremarkable. Other: 1.9 x 1 cm right thyroid gland hypodense nodule. IMPRESSION: 1. No acute intracranial abnormality. 2. No acute displaced fracture or traumatic listhesis of the cervical spine. 3. A 1.9 cm right thyroid gland nodule. Recommend thyroid US (ref: J Am Coll Radiol. 2015 Feb;12(2): 143-50). These results were called by telephone at the time of interpretation on 09/19/2023 at 8:04 pm to provider [facility] , who verbally acknowledged these results. Electronically Signed   By: Tish Frederickson M.D.   On: 09/19/2023 20:43   CT ANGIO LOWER EXT BILAT W &/OR WO CONTRAST  Result Date: 09/19/2023 CLINICAL DATA:  Knee trauma, dislocation suspected (Age >= 5y) EXAM: CT ANGIOGRAPHY OF ABDOMINAL AORTA WITH ILIOFEMORAL RUNOFF TECHNIQUE: Multidetector CT imaging of the lower abdomen, pelvis and lower extremities was performed using the standard protocol during bolus administration of intravenous contrast. Multiplanar CT image reconstructions and MIPs were obtained to evaluate the vascular anatomy. RADIATION DOSE REDUCTION: This exam was performed according to the departmental dose-optimization program which includes  automated exposure control, adjustment of the mA and/or kV according to patient size and/or use of iterative reconstruction technique. CONTRAST:  OMNIPAQUE IOHEXOL 350 MG/ML SOLN COMPARISON:  None Available. FINDINGS: VASCULAR Lower infrarenal abdominal aorta: Moderate severe atherosclerotic plaque. Normal caliber aorta without aneurysm, dissection, vasculitis or significant stenosis. IMA: Patent without evidence of aneurysm, dissection, vasculitis or significant stenosis. RIGHT Lower Extremity Inflow: Mild atherosclerotic plaque. Common, internal and external iliac arteries are patent without evidence of aneurysm, dissection, vasculitis or significant stenosis. Outflow: Mild atherosclerotic plaque. Common, superficial and profunda femoral arteries and the popliteal artery are patent without evidence of aneurysm, dissection, vasculitis or significant stenosis. Runoff: Gradual non-opacification with no definite three-vessel runoff likely due to bolus timing as well as a severe atherosclerotic plaque with likely underlying discontinuous occlusions-likely chronic. LEFT Lower Extremity Inflow: Mild atherosclerotic plaque. Common, internal and external iliac arteries are patent without evidence of aneurysm, dissection, vasculitis or significant stenosis. Outflow: Mild atherosclerotic plaque. Common, superficial and profunda femoral arteries and the popliteal artery are patent without evidence of aneurysm, dissection, vasculitis or significant stenosis. Runoff: Gradual non-opacification with no definite  three-vessel runoff likely due to bolus timing as well as a severe atherosclerotic plaque with likely underlying discontinuous occlusions-likely chronic. Veins: No obvious venous abnormality within the limitations of this arterial phase study. Review of the MIP images confirms the above findings. NON-VASCULAR Lower chest: Please see separately dictated CT chest 09/19/2023. Other: Please see separately dictated CT  abdomen pelvis 09/19/2023 Musculoskeletal: Asymmetric enlargement of the left mid to lower thigh musculature compared to the right. No heterogeneity to suggest underlying abscess or hematoma. Associated left distal thigh, knee, proximal leg subcutaneus soft tissue edema. The edema is more pronounced along the medial and posterior left knee. No subcutaneus soft tissue emphysema. No retained radiopaque foreign body. Left medial femoral condyle avulsion fracture (2:223, 6: 75-79). Small left joint effusion. No other acute fracture of the bones of the lower extremities. No evidence of dislocation of the bones of the bilateral lower extremities. No evidence of severe arthropathy. No aggressive appearing focal bone abnormality. IMPRESSION: VASCULAR Bilateral lower extremities demonstrate symmetric gradual non-opacification with no definite three-vessel runoff likely due to bolus timing as well as a severe atherosclerotic plaque with likely underlying discontinuous occlusions-likely chronic. Patency cannot be confirmed on this scan. NON-VASCULAR Left medial femoral condyle avulsion fracture. Suggestive of medial collateral ligament injury. Recommend MRI for further evaluation. Small left knee joint effusion. Asymmetric enlargement of the left mid to lower thigh musculature compared to the right. Associated left distal thigh, knee, proximal leg subcutaneus soft tissue edema. No subcutaneus soft tissue emphysema. Please see separately dictated CT chest, abdomen, pelvis. These results were called by telephone at the time of interpretation on 09/19/2023 at 8:04 pm to provider Dr. Fredricka Bonine, who verbally acknowledged these results. Electronically Signed   By: Tish Frederickson M.D.   On: 09/19/2023 20:31   CT CHEST ABDOMEN PELVIS W CONTRAST  Result Date: 09/19/2023 CLINICAL DATA:  Polytrauma, blunt. EXAM: CT CHEST, ABDOMEN, AND PELVIS WITH CONTRAST TECHNIQUE: Multidetector CT imaging of the chest, abdomen and pelvis was  performed following the standard protocol during bolus administration of intravenous contrast. RADIATION DOSE REDUCTION: This exam was performed according to the departmental dose-optimization program which includes automated exposure control, adjustment of the mA and/or kV according to patient size and/or use of iterative reconstruction technique. CONTRAST:  OMNIPAQUE IOHEXOL 350 MG/ML SOLN COMPARISON:  None Available. FINDINGS: CHEST: Cardiovascular: No aortic injury. 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 The descending thoracic aorta is normal in caliber. Aortic valve leaflet calcification. Four-vessel coronary calcification. Moderate atherosclerotic plaque of the aorta. Enlarged heart size. No significant pericardial effusion. The main pulmonary artery measures at upper limits of normal: 3.1 cm. No central or proximal segmental pulmonary embolus. Mediastinum/Nodes: No pneumomediastinum. No mediastinal hematoma. Esophageal lumen is filled with fluid.  Small hiatal hernia. The thyroid is grossly unremarkable. The central airways are patent. No mediastinal, hilar, or axillary lymphadenopathy. Lungs/Pleura: Bilateral lower lobe atelectasis. No focal consolidation. No pulmonary nodule. No pulmonary mass. No pulmonary contusion or laceration. No pneumatocele formation. No pleural effusion. No pneumothorax. No hemothorax. Musculoskeletal/Chest wall: No chest wall mass. No acute rib or sternal fracture. Age-indeterminate T12 anterior wedge compression fracture with 50% vertebral body height loss. Mild retrolisthesis of L1 on L2. ABDOMEN / PELVIS: Hepatobiliary: Not enlarged. No focal lesion. No laceration or subcapsular hematoma. The gallbladder is otherwise unremarkable with no radio-opaque gallstones. No biliary ductal dilatation. Pancreas: Normal pancreatic contour. No main pancreatic duct dilatation. Spleen: Not enlarged. No focal lesion. No laceration,  subcapsular hematoma, or vascular injury. Adrenals/Urinary Tract: No nodularity bilaterally. Bilateral kidneys enhance symmetrically. No hydronephrosis. No contusion, laceration, or subcapsular hematoma. No injury to the vascular structures or collecting systems. No hydroureter. The urinary bladder is unremarkable. On delayed imaging, there is no urothelial wall thickening and there are no filling defects in the opacified portions of the bilateral collecting systems or ureters. Stomach/Bowel: No small or large bowel wall thickening or dilatation. Colonic diverticulosis. The appendix is unremarkable. Vasculature/Lymphatics: Severe atherosclerotic plaque. No abdominal aorta or iliac aneurysm. No active contrast extravasation or pseudoaneurysm. No abdominal, pelvic, inguinal lymphadenopathy. Reproductive: Prostate is unremarkable. Other: No simple free fluid ascites. No pneumoperitoneum. No hemoperitoneum. No mesenteric hematoma identified. No organized fluid collection. Musculoskeletal: No significant soft tissue hematoma. Small fat containing umbilical hernia. No acute pelvic fracture. No spinal fracture. Ports and Devices: None. IMPRESSION: 1. Age-indeterminate, possibly acute, T12 anterior wedge compression fracture. Correlate with point tenderness to palpation for an acute component. 2. No acute intrathoracic, intra-abdominal, intrapelvic traumatic injury. 3. No acute fracture or traumatic malalignment of the thoracic spine. 4. Small hiatal hernia with esophageal lumen filled with fluid. Correlate clinically and consider enteric tube placement. Other imaging findings of potential clinical significance: 1. 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 Recommend annual imaging followup by CTA or MRA. This recommendation follows 2010 ACCF/AHA/AATS/ACR/ASA/SCA/SCAI/SIR/STS/SVM Guidelines for the Diagnosis and Management of Patients with Thoracic Aortic Disease. Circulation. 2010; 121: O130-Q657. Aortic aneurysm NOS  (ICD10-I71.9). 2. Aortic Atherosclerosis (ICD10-I70.0) including four-vessel coronary artery and aortic valve leaflet calcifications-correlate for aortic stenosis. 3. Cardiomegaly. These results were called by telephone at the time of interpretation on 09/19/2023 at 8:04 pm to provider [facility] , who verbally acknowledged these results. Electronically Signed   By: Tish Frederickson M.D.   On: 09/19/2023 20:17   DG Pelvis Portable  Result Date: 09/19/2023 CLINICAL DATA:  Trauma deformity/abrasion to left knee after pt had a tire on his knee for ab an hour per family. EXAM: PORTABLE PELVIS 1-2 VIEWS COMPARISON:  None Available. FINDINGS: There is no evidence of pelvic fracture or diastasis. No acute displaced fracture or dislocation of either hips. No pelvic bone lesions are seen. Degenerative changes of visualized lower lumbar spine. IMPRESSION: Negative for acute traumatic injury. Electronically Signed   By: Tish Frederickson M.D.   On: 09/19/2023 19:49   DG Chest Port 1 View  Result Date: 09/19/2023 CLINICAL DATA:  Trauma deformity/abrasion to left knee after pt had a tire on his knee for ab an hour per family. EXAM: PORTABLE CHEST 1 VIEW COMPARISON:  Chest x-ray 10/23/2019 FINDINGS: Slightly more prominent cardiomediastinal silhouette likely due rotation. Otherwise the heart and mediastinal contours are unchanged. Atherosclerotic plaque. No focal consolidation. No pulmonary edema. No pleural effusion. No pneumothorax. No acute osseous abnormality. IMPRESSION: 1. No active disease. 2.  Aortic Atherosclerosis (ICD10-I70.0). Electronically Signed   By: Tish Frederickson M.D.   On: 09/19/2023 19:47    Review of Systems  HENT:  Negative for ear discharge, ear pain, hearing loss and tinnitus.   Eyes:  Negative for photophobia and pain.  Respiratory:  Negative for cough and shortness of breath.   Cardiovascular:  Negative for chest pain.  Gastrointestinal:  Negative for abdominal pain, nausea and  vomiting.  Genitourinary:  Negative for dysuria, flank pain, frequency and urgency.  Musculoskeletal:  Positive for arthralgias (Left knee). Negative for back pain, myalgias and neck pain.  Neurological:  Negative for dizziness and headaches.  Hematological:  Does not bruise/bleed easily.  Psychiatric/Behavioral:  The patient is not nervous/anxious.    Blood pressure 113/87, pulse 88, temperature 97.6 F (36.4 C), temperature source Oral, resp. rate 16, height 5\' 10"  (1.778 m), weight 85.3 kg, SpO2 99%. Physical Exam Constitutional:      General: He is not in acute distress.    Appearance: He is well-developed. He is not diaphoretic.  HENT:     Head: Normocephalic and atraumatic.  Eyes:     General: No scleral icterus.       Right eye: No discharge.        Left eye: No discharge.     Conjunctiva/sclera: Conjunctivae normal.  Cardiovascular:     Rate and Rhythm: Normal rate and regular rhythm.  Pulmonary:     Effort: Pulmonary effort is normal. No respiratory distress.  Musculoskeletal:     Cervical back: Normal range of motion.     Comments: LLE Scattered minor abrasions, no ecchymosis or rash, knee swollen with fx blisters medial aspect, KI in place  Mod TTP, compartments soft  No ankle effusion  Sens DPN, SPN, TN intact  Motor EHL, ext, flex, evers 5/5  DP 1+, PT 0, No significant edema  Skin:    General: Skin is warm and dry.  Neurological:     Mental Status: He is alert.  Psychiatric:        Mood and Affect: Mood normal.        Behavior: Behavior normal.     Assessment/Plan: Left knee derangement -- Will change to ROM knee brace and allow WBAT. F/u with Dr. Aundria Rud next week. May need reconstructive surgery down the road but nothing planned acutely.    Freeman Caldron, PA-C Orthopedic Surgery [phone] 09/20/2023, 9:24 AM
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
## 2023-09-20 NOTE — Plan of Care (Signed)

## 2023-09-20 NOTE — Progress Notes (Signed)
PHARMACY - ANTICOAGULATION CONSULT NOTE  Pharmacy Consult for heparin Indication: atrial fibrillation  Allergies  Allergen Reactions   Terazosin Hcl     Dizziness and hypotension    Patient Measurements: Height: 5\' 10"  (177.8 cm) Weight: 85.3 kg (188 lb) IBW/kg (Calculated) : 73 Heparin Dosing Weight: 85.3 kg  Vital Signs: Temp: 97.9 F (36.6 C) (10/29 2048) Temp Source: Oral (10/29 2048) BP: 92/78 (10/29 2306) Pulse Rate: 138 (10/29 2355)  Labs: Recent Labs    09/19/23 1912 09/19/23 1918 09/19/23 2229  HGB 12.4* 12.2*  --   HCT 37.9* 36.0*  --   PLT 232  --   --   LABPROT 18.3*  --   --   INR 1.5*  --   --   CREATININE 2.36* 2.30*  --   CKTOTAL 7,242*  --  10,725*    Estimated Creatinine Clearance: 27.8 mL/min (A) (by C-G formula based on SCr of 2.3 mg/dL (H)).   Assessment: 84 yoM presented to ED with leg injury and found to be in afib RVR. 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 Pharmacy consulted to dose heparin.  -Hgb 12.2, sCr 2.3, plts 232, on eliquis PTA (last dose on 10/29 @ in afternoon for 1 dose)  Goal of Therapy:  Heparin level 0.3-0.7 units/ml aPTT 66-102 seconds Monitor platelets by anticoagulation protocol: Yes   Plan:  -No bolus given recent Eliquis administration  -Start heparin infusion at 1200 units/hr -Follow up aPTT and heparin level in 8h -Daily levels until correlate, then follow with heparin levels -CBC daily -Follow up plans for surgery on 10/30  Arabella Merles, PharmD. Clinical Pharmacist 09/20/2023 12:40 AM

## 2023-09-20 NOTE — Assessment & Plan Note (Addendum)
Hypokalemia, Rhabdomyolysis. Lactic acidosis.  Urinary retention.  ATN likely due to multiple mechanisms, related to rhabdomyolysis.   11/01 tunneled HD cathter placed.   Urinary output today is 650 ml 11/02 HD with ultrafiltration 1000 ml  11/05 HD today.  Systolic blood pressure 103 to 117 mmHg.   Elevated liver enzymes due to hypoperfusion, have been trending down.   Today renal function pre HD, BUN 65, K 3,2 and serum bicarbonate 30. Na 134, P 5.4 Ca 7,9, with cr at 7,23  Ck 1,692  Considering with normal saline at 50 ml per hr to promote Ck clearance.    Follow up renal function and Ck.  Follow up renal function and electrolytes in am.

## 2023-09-20 NOTE — Progress Notes (Signed)
Orthopedic Tech Progress Note Patient Details:  Jorge West 28-Feb-1946 [id number]  Called in order to [employer] for a ROM KNEE BRACE   Patient ID: Jorge West, male   DOB: 1946/05/18, 77 y.o.   MRN: [id number]  [name] 09/20/2023, 7:24 PM

## 2023-09-20 NOTE — Progress Notes (Deleted)
Orthopedic Tech Progress Note Patient Details:  Jorge West Sep 11, 1946 [id number]  Ortho Devices Type of Ortho Device: Knee Immobilizer Ortho Device/Splint Location: RLE Ortho Device/Splint Interventions: Ordered, Application, Adjustment   Post Interventions Patient Tolerated: Well Instructions Provided: Care of device, Adjustment of device Blistering noted to medial thigh, covered with abd pads before applying knee immobilizer to protect the skin. Darleen Crocker 09/20/2023, 5:33 AM
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
## 2023-09-20 NOTE — TOC CAGE-AID Note (Signed)
Transition of Care [facility]) - CAGE-AID Screening   Patient Details  Name: Jorge West MRN: [id number] Date of Birth: [birth year]  Transition of [facility]) CM/SW Contact:    Katha Hamming, RN Phone Number: 09/20/2023, 7:59 PM   Clinical Narrative:  Denies drug/alcohol use, no resources indicated.  CAGE-AID Screening:    Have You Ever Felt You Ought to Cut Down on Your Drinking or Drug Use?: No Have People Annoyed You By Critizing Your Drinking Or Drug Use?: No Have You Felt Bad Or Guilty About Your Drinking Or Drug Use?: No Have You Ever Had a Drink or Used Drugs First Thing In The Morning to Steady Your Nerves or to Get Rid of a Hangover?: No CAGE-AID Score: 0  Substance Abuse Education Offered: No

## 2023-09-20 NOTE — Progress Notes (Signed)
Pts BP is 86/64 (MAP 72), pt asymptomatic on an amio gtt. Provider paged . Informed provider that pt has been running soft previously. Pt was given 1mg  Dilaudid at 2010. See new orders , provider states that he will come to see pt at the bedside. Pt has no concerns at this time. Call bell within reach, bed alarm set.   09/20/23 2200  Vitals  Temp (!) 97.5 F (36.4 C)  Temp Source Axillary  BP (!) 86/64  MAP (mmHg) 72  BP Location Left Arm  BP Method Automatic  Patient Position (if appropriate) Lying  Pulse Rate (!) 107  Pulse Rate Source Monitor  ECG Heart Rate 98  Resp 14  Level of Consciousness  Level of Consciousness Alert  MEWS COLOR  MEWS Score Color Green  Oxygen Therapy  SpO2 92 %  O2 Device Room Air  MEWS Score  MEWS Temp 0  MEWS Systolic 1  MEWS Pulse 0  MEWS RR 0  MEWS LOC 0  MEWS Score 1  Provider Notification  Provider Name/Title Derrell Lolling MD; [facility]  Date Provider Notified 09/20/23  Time Provider Notified 2201  Method of Notification Page  Notification Reason Other (Comment) (BP: 86/64 MAP 72, pt asymptomatic on a amio gtt)  Provider response See new orders (states he will come to bed side to see pt)  Date of Provider Response 09/20/23  Time of Provider Response 2204

## 2023-09-20 NOTE — ED Notes (Signed)
Report given bedside on 4E

## 2023-09-20 NOTE — Assessment & Plan Note (Addendum)
Continue amiodarone for rate control.  No anticoagulation due to hematoma at left leg.  Continue telemetry monitoring.

## 2023-09-20 NOTE — Assessment & Plan Note (Addendum)
Echocardiogram with preserved LV systolic function with EF 60- to 65%, mild LVH, RV with normal systolic function, no significant valvular disease.   Today with signs of hypervolemia.  Systolic blood pressure 129 to 113 mmHg.   Continue amiodarone for rate control atrial fibrillation.  Continue metoprolol.  Continue to hold RAAS inhibition due to unstable GFR.   Acute hypoxemic respiratory failure, with 02 saturation 98% on 2 L/min per [place].
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
## 2023-09-21 ENCOUNTER — Inpatient Hospital Stay (HOSPITAL_COMMUNITY): Payer: Medicare PPO

## 2023-09-21 DIAGNOSIS — I5033 Acute on chronic diastolic (congestive) heart failure: Secondary | ICD-10-CM

## 2023-09-21 DIAGNOSIS — I4891 Unspecified atrial fibrillation: Secondary | ICD-10-CM | POA: Diagnosis not present

## 2023-09-21 DIAGNOSIS — I10 Essential (primary) hypertension: Secondary | ICD-10-CM | POA: Diagnosis not present

## 2023-09-21 DIAGNOSIS — N1831 Chronic kidney disease, stage 3a: Secondary | ICD-10-CM | POA: Diagnosis not present

## 2023-09-21 DIAGNOSIS — D638 Anemia in other chronic diseases classified elsewhere: Secondary | ICD-10-CM

## 2023-09-21 LAB — COMPREHENSIVE METABOLIC PANEL
ALT: 47 U/L — ABNORMAL HIGH (ref 0–44)
AST: 233 U/L — ABNORMAL HIGH (ref 15–41)
Albumin: 2.2 g/dL — ABNORMAL LOW (ref 3.5–5.0)
Alkaline Phosphatase: 31 U/L — ABNORMAL LOW (ref 38–126)
Anion gap: 13 (ref 5–15)
BUN: 36 mg/dL — ABNORMAL HIGH (ref 8–23)
CO2: 20 mmol/L — ABNORMAL LOW (ref 22–32)
Calcium: 7.4 mg/dL — ABNORMAL LOW (ref 8.9–10.3)
Chloride: 104 mmol/L (ref 98–111)
Creatinine, Ser: 4.71 mg/dL — ABNORMAL HIGH (ref 0.61–1.24)
GFR, Estimated: 12 mL/min — ABNORMAL LOW (ref >60)
Glucose, Bld: 120 mg/dL — ABNORMAL HIGH (ref 70–99)
Potassium: 4 mmol/L (ref 3.5–5.1)
Sodium: 137 mmol/L (ref 135–145)
Total Bilirubin: 0.6 mg/dL (ref 0.3–1.2)
Total Protein: 4.8 g/dL — ABNORMAL LOW (ref 6.5–8.1)

## 2023-09-21 LAB — CBC
HCT: 29.9 % — ABNORMAL LOW (ref 39.0–52.0)
Hemoglobin: 10 g/dL — ABNORMAL LOW (ref 13.0–17.0)
MCH: 30.2 pg (ref 26.0–34.0)
MCHC: 33.4 g/dL (ref 30.0–36.0)
MCV: 90.3 fL (ref 80.0–100.0)
Platelets: 153 10*3/uL (ref 150–400)
RBC: 3.31 MIL/uL — ABNORMAL LOW (ref 4.22–5.81)
RDW: 14.1 % (ref 11.5–15.5)
WBC: 8.7 10*3/uL (ref 4.0–10.5)
nRBC: 0 % (ref 0.0–0.2)

## 2023-09-21 LAB — CK: Total CK: 11054 U/L — ABNORMAL HIGH (ref 49–397)

## 2023-09-21 LAB — LACTIC ACID, PLASMA: Lactic Acid, Venous: 2.1 mmol/L (ref 0.5–1.9)

## 2023-09-21 MED ORDER — OXYCODONE HCL 5 MG PO TABS
5.0000 mg | ORAL_TABLET | ORAL | Status: DC | PRN
  Administered 2023-09-21 – 2023-10-27 (×15): 5 mg via ORAL
  Filled 2023-09-21 (×19): qty 1

## 2023-09-21 MED ORDER — TIMOLOL MALEATE 0.5 % OP SOLN
1.0000 [drp] | Freq: Every day | OPHTHALMIC | Status: DC
  Administered 2023-09-21 – 2023-10-31 (×41): 1 [drp] via OPHTHALMIC
  Filled 2023-09-21: qty 5

## 2023-09-21 MED ORDER — CLOPIDOGREL BISULFATE 75 MG PO TABS
75.0000 mg | ORAL_TABLET | Freq: Every day | ORAL | Status: DC
  Administered 2023-09-21: 75 mg via ORAL
  Filled 2023-09-21: qty 1

## 2023-09-21 MED ORDER — HYDROMORPHONE HCL 1 MG/ML IJ SOLN
0.5000 mg | INTRAMUSCULAR | Status: DC | PRN
  Administered 2023-09-24 – 2023-09-28 (×6): 0.5 mg via INTRAVENOUS
  Filled 2023-09-21 (×7): qty 0.5

## 2023-09-21 MED ORDER — BRIMONIDINE TARTRATE-TIMOLOL 0.2-0.5 % OP SOLN
1.0000 [drp] | Freq: Every day | OPHTHALMIC | Status: DC
Start: 2023-09-21 — End: 2023-09-21
  Filled 2023-09-21: qty 0.1
  Filled 2023-09-21: qty 5

## 2023-09-21 MED ORDER — BRIMONIDINE TARTRATE 0.2 % OP SOLN
1.0000 [drp] | Freq: Every day | OPHTHALMIC | Status: DC
  Administered 2023-09-21 – 2023-10-31 (×41): 1 [drp] via OPHTHALMIC
  Filled 2023-09-21: qty 5

## 2023-09-21 MED ORDER — METOPROLOL SUCCINATE ER 25 MG PO TB24
25.0000 mg | ORAL_TABLET | Freq: Every day | ORAL | Status: DC
  Administered 2023-09-21 – 2023-10-12 (×22): 25 mg via ORAL
  Filled 2023-09-21 (×22): qty 1

## 2023-09-21 MED ORDER — SODIUM CHLORIDE 0.9 % IV SOLN: INTRAVENOUS | Status: DC

## 2023-09-21 MED ORDER — CHLORHEXIDINE GLUCONATE CLOTH 2 % EX PADS
6.0000 | MEDICATED_PAD | Freq: Every day | CUTANEOUS | Status: DC
  Administered 2023-09-21 – 2023-10-05 (×15): 6 via TOPICAL

## 2023-09-21 MED ORDER — STERILE WATER FOR INJECTION IV SOLN
INTRAVENOUS | Status: DC
  Filled 2023-09-21: qty 150
  Filled 2023-09-21: qty 1000
  Filled 2023-09-21: qty 150
  Filled 2023-09-21 (×2): qty 1000
  Filled 2023-09-21: qty 150
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
## 2023-09-21 NOTE — Progress Notes (Signed)
Inpatient Rehab Admissions Coordinator Note:   Per PT recommendations patient was screened for CIR candidacy by Stephania Fragmin, PT. At this time, pt appears to be a potential candidate for CIR. I will place an order for rehab consult for full assessment, per our protocol.  Please contact me any with questions.Estill Dooms, PT, DPT [phone] 09/21/23 9:41 AM

## 2023-09-21 NOTE — Evaluation (Signed)
Occupational Therapy Evaluation Patient Details Name: Jorge West MRN: [id number] DOB: 12-26-1945 Today's Date: 09/21/2023   History of Present Illness 77 yo male admitted 10/29 with Left tibial plateau fx, medial femoral condyle avulsion fx, fibular head fx, lipohemarthrosis, suspected ligamentous injury. T12 anterior wedge compression fracture. He was changing a tire on is car and it fell on his leg.  Additionally, pt with atrial fibrillation with RVR, rhabdomyolysis, and acute on chronic diastolic CHF.   PMH: heart failure, atrial fibrillation, coronary artery disease, paroxysmal atrial fibrillation, hypertension, CKD and BPH   Clinical Impression   Pt c/o pain 7/10 to L knee at rest, in L hinge brace upon entry. Pt lives with wife in 2 story house, lives on ground floor, hoping to get a ramp installed soon. Pt PLOF independent, walks around home without AD. Pt currently mod A for in/out of bed, not able to stand from standard bed height with max support, mod A for STS from elevated surface. Pt max effort for taking one step or pivoting, limited WB through LLE. Pt requires max A for LB dressing. Pt would benefit from postacute intensive rehab >3hrs/day to improve to safe, functional level prior to returning home, will be seen acutely to improve as able.        If plan is discharge home, recommend the following: Two people to help with walking and/or transfers;A lot of help with bathing/dressing/bathroom;Assistance with cooking/housework;Assist for transportation;Help with stairs or ramp for entrance    Functional Status Assessment  Patient has had a recent decline in their functional status and demonstrates the ability to make significant improvements in function in a reasonable and predictable amount of time.  Equipment Recommendations  Other (comment) (defer)    Recommendations for Other Services Rehab consult     Precautions / Restrictions Precautions Precautions:  Fall;Knee;Back Precaution Comments: Lt knee tibial, femoral condyle, and radial head fx. MCL avulsion, likely ACL tear. Required Braces or Orthoses: Knee Immobilizer - Left;Other Brace Knee Immobilizer - Left: On except when in CPM;Other (comment) (or working with PT/OT) Other Brace: Hinge brace has been ordered Restrictions Weight Bearing Restrictions: Yes LLE Weight Bearing: Non weight bearing      Mobility Bed Mobility Overal bed mobility: Needs Assistance Bed Mobility: Supine to Sit, Sit to Supine     Supine to sit: Mod assist, HOB elevated, Used rails Sit to supine: Mod assist   General bed mobility comments: mod A in/out of bed, able to sit up without rolling, assist for LLE and scooting in bed    Transfers Overall transfer level: Needs assistance Equipment used: Rolling walker (2 wheels) Transfers: Sit to/from Stand Sit to Stand: Mod assist           General transfer comment: was not able to stand from standard bed height today, mod A from elevated surface with RW. max effort to take one step and pivot      Balance Overall balance assessment: Needs assistance Sitting-balance support: No upper extremity supported, Feet supported Sitting balance-Leahy Scale: Fair Sitting balance - Comments: EOB ADLs   Standing balance support: Bilateral upper extremity supported, Reliant on assistive device for balance Standing balance-Leahy Scale: Poor Standing balance comment: once up stands with CGA, reliant on RW, minimal ability to WB through LLE                           ADL either performed or assessed with clinical judgement   ADL Overall  ADL's : Needs assistance/impaired Eating/Feeding: Independent   Grooming: Set up;Sitting   Upper Body Bathing: Set up;Sitting   Lower Body Bathing: Moderate assistance;Sitting/lateral leans   Upper Body Dressing : Set up;Sitting   Lower Body Dressing: Maximal assistance;Sit to/from stand   Toilet Transfer: Moderate  assistance;Rolling walker (2 wheels);BSC/3in1             General ADL Comments: Pt mod A for pivot transfer to [facility], poor stability with LLE. Pt max A for LB dressing, set up for UB ADLs.     Vision Baseline Vision/History: 0 No visual deficits Ability to See in Adequate Light: 0 Adequate Patient Visual Report: No change from baseline;Other (comment) (Pt states L eye is always dilated, tested and able to see across room well.)       Perception         Praxis         Pertinent Vitals/Pain Pain Assessment Pain Assessment: 0-10 Pain Score: 7  Pain Location: Lt knee and back Pain Descriptors / Indicators: Aching Pain Intervention(s): Monitored during session     Extremity/Trunk Assessment Upper Extremity Assessment Upper Extremity Assessment: Overall WFL for tasks assessed   Lower Extremity Assessment Lower Extremity Assessment: Defer to PT evaluation       Communication Communication Communication: No apparent difficulties   Cognition Arousal: Alert Behavior During Therapy: WFL for tasks assessed/performed Overall Cognitive Status: Within Functional Limits for tasks assessed                                       General Comments  HR ranged from 110-140 during session, hx of a-fib    Exercises     Shoulder Instructions      Home Living Family/patient expects to be discharged to:: Private residence Living Arrangements: Spouse/significant other Available Help at Discharge: Family;Available 24 hours/day Type of Home: House Home Access: Stairs to enter [employer] of Steps: 3-4 Entrance Stairs-Rails: Right Home Layout: One level;Able to live on main level with bedroom/bathroom;Laundry or work area in basement;Full bath on main level     Bathroom Shower/Tub: Walk-in shower     Bathroom Accessibility: Yes How Accessible: Accessible via walker Home Equipment: Rolling Walker (2 wheels);Shower seat - built in;Hand held shower  head;Grab bars - tub/shower   Additional Comments: Pt lives with wife, available 24/7, son can assist after work      Prior Functioning/Environment Prior Level of Function : Independent/Modified Independent;Driving;Working/employed             Mobility Comments: Ind, farmer - livestock ADLs Comments: ind        OT Problem List: Decreased strength;Decreased range of motion;Decreased activity tolerance;Impaired balance (sitting and/or standing);Pain      OT Treatment/Interventions: Self-care/ADL training;Therapeutic exercise;Energy conservation;DME and/or AE instruction;Therapeutic activities;Balance training;Patient/family education    OT Goals(Current goals can be found in the care plan section) Acute Rehab OT Goals Patient Stated Goal: to return home OT Goal Formulation: With patient/family Time For Goal Achievement: 10/05/23 Potential to Achieve Goals: Good  OT Frequency: Min 1X/week    Co-evaluation              AM-PAC OT "6 Clicks" Daily Activity     Outcome Measure Help from another person eating meals?: None Help from another person taking care of personal grooming?: A Little Help from another person toileting, which includes using toliet, bedpan, or urinal?: A Lot Help  from another person bathing (including washing, rinsing, drying)?: A Lot Help from another person to put on and taking off regular upper body clothing?: A Little Help from another person to put on and taking off regular lower body clothing?: A Lot 6 Click Score: 16   End of Session Equipment Utilized During Treatment: Gait belt;Rolling walker (2 wheels) Nurse Communication: Mobility status  Activity Tolerance: Patient tolerated treatment well Patient left: in bed;with call bell/phone within reach;with bed alarm set;with family/visitor present  OT Visit Diagnosis: Unsteadiness on feet (R26.81);Other abnormalities of gait and mobility (R26.89);Muscle weakness (generalized) (M62.81);Pain Pain  - Right/Left: Left Pain - part of body: Knee                Time: 8295-6213 OT Time Calculation (min): 34 min Charges:  OT General Charges $OT Visit: 1 Visit OT Evaluation $OT Eval Moderate Complexity: 1 Mod OT Treatments $Self Care/Home Management : 8-22 mins  Zayna Toste, OTR/L   Alexis Goodell 09/21/2023, 3:25 PM

## 2023-09-21 NOTE — Progress Notes (Addendum)
Foley catheter has been placed this morning, and despite aggressive IV fluids, his urine output continue to be very low, reported 10 cc.(Even after flushing foley cathter).  Concerned for ATN, rhabdomyolysis related. Currently patient with not volume overload, but if continues to have anuria, will need renal replacement therapy. Will consult nephrology.   Follow up lower extremity CT scans with diffuse soft tissue swelling throughout the thigh with ill defined subcutaneous hematoma about the knee. There is small volume hemorrhage tracking along the deep fascial planes of the posterior compartment of the distal thigh.   Plan to stop anticoagulation, and follow up clinically and imaging any signs of worsening hematoma.  Calling orthopedics, to follow up CT images.

## 2023-09-21 NOTE — Assessment & Plan Note (Addendum)
Follow up hgb is 9.7
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
## 2023-09-21 NOTE — Progress Notes (Signed)
OT Cancellation Note  Patient Details Name: ABDULAZEEZ MCPHEARSON MRN: [id number] DOB: 12-23-1945   Cancelled Treatment:    Reason Eval/Treat Not Completed: (P) Patient at procedure or test/ unavailable, Pt heading to CT, will reattempt later.  Alexis Goodell 09/21/2023, 11:05 AM

## 2023-09-21 NOTE — Plan of Care (Signed)
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
## 2023-09-22 ENCOUNTER — Encounter (HOSPITAL_COMMUNITY): Payer: Medicare PPO

## 2023-09-22 ENCOUNTER — Inpatient Hospital Stay (HOSPITAL_COMMUNITY): Payer: Medicare PPO

## 2023-09-22 DIAGNOSIS — N1831 Chronic kidney disease, stage 3a: Secondary | ICD-10-CM | POA: Diagnosis not present

## 2023-09-22 DIAGNOSIS — I4891 Unspecified atrial fibrillation: Secondary | ICD-10-CM | POA: Diagnosis not present

## 2023-09-22 DIAGNOSIS — I5033 Acute on chronic diastolic (congestive) heart failure: Secondary | ICD-10-CM | POA: Diagnosis not present

## 2023-09-22 DIAGNOSIS — S8782XA Crushing injury of left lower leg, initial encounter: Secondary | ICD-10-CM | POA: Diagnosis not present

## 2023-09-22 DIAGNOSIS — R570 Cardiogenic shock: Secondary | ICD-10-CM | POA: Diagnosis not present

## 2023-09-22 HISTORY — PX: IR FLUORO GUIDE CV LINE RIGHT: IMG2283

## 2023-09-22 HISTORY — PX: IR US GUIDE VASC ACCESS RIGHT: IMG2390

## 2023-09-22 LAB — CK: Total CK: 10049 U/L — ABNORMAL HIGH (ref 49–397)

## 2023-09-22 LAB — COMPREHENSIVE METABOLIC PANEL
ALT: 45 U/L — ABNORMAL HIGH (ref 0–44)
AST: 198 U/L — ABNORMAL HIGH (ref 15–41)
Albumin: 2.2 g/dL — ABNORMAL LOW (ref 3.5–5.0)
Alkaline Phosphatase: 36 U/L — ABNORMAL LOW (ref 38–126)
Anion gap: 11 (ref 5–15)
BUN: 44 mg/dL — ABNORMAL HIGH (ref 8–23)
CO2: 25 mmol/L (ref 22–32)
Calcium: 7.4 mg/dL — ABNORMAL LOW (ref 8.9–10.3)
Chloride: 100 mmol/L (ref 98–111)
Creatinine, Ser: 6.03 mg/dL — ABNORMAL HIGH (ref 0.61–1.24)
GFR, Estimated: 9 mL/min — ABNORMAL LOW (ref >60)
Glucose, Bld: 98 mg/dL (ref 70–99)
Potassium: 3.4 mmol/L — ABNORMAL LOW (ref 3.5–5.1)
Sodium: 136 mmol/L (ref 135–145)
Total Bilirubin: 0.6 mg/dL (ref 0.3–1.2)
Total Protein: 4.7 g/dL — ABNORMAL LOW (ref 6.5–8.1)

## 2023-09-22 MED ORDER — LIDOCAINE HCL 1 % IJ SOLN
INTRAMUSCULAR | Status: AC
  Filled 2023-09-22: qty 20

## 2023-09-22 MED ORDER — AMIODARONE HCL 200 MG PO TABS
200.0000 mg | ORAL_TABLET | Freq: Every day | ORAL | Status: DC
  Administered 2023-09-22 – 2023-10-02 (×11): 200 mg via ORAL
  Filled 2023-09-22 (×13): qty 1

## 2023-09-22 MED ORDER — LIDOCAINE HCL 1 % IJ SOLN
20.0000 mL | Freq: Once | INTRAMUSCULAR | Status: AC
  Administered 2023-09-22: 7 mL

## 2023-09-22 MED ORDER — HEPARIN SODIUM (PORCINE) 1000 UNIT/ML IJ SOLN
INTRAMUSCULAR | Status: AC
  Filled 2023-09-22: qty 10

## 2023-09-22 NOTE — Progress Notes (Addendum)
Patient Name: Jorge West Date of Encounter: 09/22/2023 [facility] Cardiologist: None   Interval Summary  .    Patient with much improved rate control today. He reports ongoing left leg pain/pressure. No dyspnea at rest or orthopnea but does endorse quick fatigue and feeling short of breath with minimal exertion. Patient pending dialysis catheter placement with IR today.   Vital Signs .    Vitals:   09/21/23 2341 09/22/23 0300 09/22/23 0500 09/22/23 0805  BP: 113/78 116/74 119/77 119/70  Pulse: 100 85 94 89  Resp: 19 15 (!) 22 17  Temp: 98.1 F (36.7 C) 98.1 F (36.7 C)  98 F (36.7 C)  TempSrc: Oral Oral  Oral  SpO2: 94% 97% (!) 89% 98%  Weight:   97.7 kg   Height:        Intake/Output Summary (Last 24 hours) at 09/22/2023 0916 Last data filed at 09/22/2023 0403 Gross per 24 hour  Intake 2706.64 ml  Output 300 ml  Net 2406.64 ml      09/22/2023    5:00 AM 09/21/2023    3:00 AM 09/19/2023    7:57 PM  Last 3 Weights  Weight (lbs) 215 lb 6.2 oz 205 lb 0.4 oz 188 lb  Weight (kg) 97.7 kg 93 kg 85.276 kg      Telemetry/ECG    Rate controlled afib, rates in the 70s-80s this morning - Personally Reviewed  Physical Exam .   GEN: No acute distress.   Neck: No JVD Cardiac: Irregularly irregular, no murmurs, rubs, or gallops.  Respiratory: lungs generally clear. Faint bibasilar crackles. GI: Soft, nontender, non-distended  MS: left leg with bruising, brace in place  Assessment & Plan .     Longstanding persistent atrial fibrillation Acute RVR  Patient with prior afib ablation, has also previously failed AAD therapy. RVR in the settting of shock, acute limb injury.  Rates are much better controlled today. Given tenuous status with recent hypotension/hypovolemic shock, favor ongoing Amiodarone while admitted. Transition to 200mg  PO daily and would plan to stop at discharge.  Continue Toprol XL 50mg .  Eliquis held yesterday with concern of  compartment syndrome and hemoglobin decrease from 12.3->10. Resume anti-coagulation as soon as safe to do so  CAD  Patient with catheterization 07/26/2023 with multivessel disease with CTO of his RCA. Underwent DES to OM1.   As below, troponin elevation not consistent with ACS. Plavix held yesterday by primary team. Given decrease in hemoglobin and plan for dialysis catheter placement today, this is reasonable but with recent stent placement, resume as soon as safe to do so.  Statin held with acute liver injury secondary to rhabdomyolysis.  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   Patient euvolemic appearing on exam but will need close monitoring of volume status with ongoing IVF and progressive AKI/limited urine output.  Hold GDMT Entresto with AKI  Elevated troponin  Patient admitted following a crush injury of left medial femoral condyle. CK improved to 10049 from peak of 01027. Lactic acid not repeated since 10/31, was 2.1. Troponin 55->76->163->217. Transaminases generally trending down.   Troponin elevation not consistent with ACS but rather systemic inflammation secondary to rhabdomyolysis and demand ischemia with hypovolemic shock.  Continue IV fluids to support renal function. Will need to monitor respiratory status closely with HFmrEF.   Per primary team: Tranaminitis Rhabdomyolysis Left medial femoral condyle avulsion fracture  For questions or updates, please contact [facility] Please consult [email or web address] for contact  info under        Signed, Perlie Gold, PA-C   Personally seen and examined. Agree with above. Creatinine continues to rise.  Now on bicarbonate IV drip.  Atrial fibrillation under good control heart rate wise.  We will go ahead and transition him to oral amiodarone.  Hopefully he will only need this for this hospitalization and this can be discontinued at discharge.  Wife in room.  Lungs remain clear.  Left leg brace in place.   Hematoma knee.  Eliquis on hold for fear of compartment syndrome.  Rhabdomyolysis-nephrology on board. CAD-stent placed approximately 60 days ago.  Now off of dual antiplatelet therapy because of trauma episode.  Thankfully, recent trial data has shown success with early discontinuation of dual antiplatelet therapy however when reasonable, initiation of Plavix at least should take place.   Donato Schultz, MD

## 2023-09-22 NOTE — Consult Note (Addendum)
Physical Medicine and Rehabilitation Consult Reason for Consult: Evaluate appropriateness for Inpatient Rehab Referring Physician: Dr. Ella Jubilee    HPI: ANTWANE GROSE is a 77 y.o. male with PMHx of  has a past medical history of Atrial fibrillation (HCC), Atypical mole (06/14/2000), Atypical mole (11/29/2001), Atypical mole (11/29/2001), Atypical mole (11/29/2001), Atypical mole (01/11/2007), Atypical mole (07/14/2008), Basal cell carcinoma (07/29/1996), Basal cell carcinoma (09/22/2004), Basal cell carcinoma (10/20/2005), Basal cell carcinoma (10/20/2011), Basal cell carcinoma (02/07/2018), Basal cell carcinoma (10/23/2018), Benign prostate hyperplasia, Cataract, Depression, Depression, Diverticulosis, Erectile dysfunction, Hyperlipidemia, Hypertension, Impaired fasting glucose, Nocturia, Personal history of colonic polyps (04/19/2005), 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 . They were admitted to [facility] on 09/19/2023 for crush injury to his left leg after having a truck fall onto his left leg while changing the tire, which trapped him for approximately 2 hours prior to being found by his family and EMS called.  ED evaluation was significant for hypertension, tachycardia, AKI with elevated CK7 242, transaminitis, leukocytosis 16, and lactic acid 4.8.  Imaging was significant for a left medial femoral condyle avulsion fracture, additional finding of age-indeterminate T12 wedge compression fracture.  He was placed on IV fluids and amiodarone, and admitted to the hospital for further management.  CT left lower extremity shows subcutaneous hematoma  around the knee and a small volume hemorrhage tracking in the deep fascial planes of the posterior compartment of the distal thigh.  Orthopedics was consulted and recommended nonoperative management with range of motion knee brace to allow weightbearing as tolerated.  Dr. Aundria Rud to evaluate this coming week for possible surgical reconstruction, however no acute inventions planned at this time.  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 intakes.  PM&R was consulted to evaluate appropriateness for IPR admission.   Patient evaluated at bedside, with his wife.  They live in a single-story home with a basement, with steps to enter in the front and the back, with a railing in the back.  They have ability to place a ramp at the front door if needed.  They have some family that lives nearby and can check on him intermittently, but ultimately only his wife can provide 24/7 care, and she is unable to provide physical assistance to do her own medical issues.  Prior to this, patient was independent of ADLs and mobility, not using assistive devices.  They do endorse that, prior, the wife is able to operate at a wheelchair level in their home without significant issues.   Review of Systems  Constitutional:  Negative for chills and fever.  Respiratory:  Negative for cough and shortness of breath.   Cardiovascular:  Positive for palpitations. Negative for chest pain.  Gastrointestinal:  Positive for abdominal pain. Negative for constipation, nausea and vomiting.       Loss of appetite  Genitourinary:        Oliguria  Musculoskeletal:  Positive for back pain and joint pain.  Neurological:  Positive  for sensory change and weakness. Negative for dizziness, loss of consciousness and headaches.  Psychiatric/Behavioral:  Negative for depression and memory loss. The patient is not nervous/anxious and does not have insomnia.    Past Medical History:  Diagnosis Date   Atrial fibrillation [facility])    Had an ablation done   Atypical mole 06/14/2000   moderate/marked on mid back Nino Glow)   Atypical mole 11/29/2001   slight/moderate on right shoulder Nino Glow)   Atypical mole 11/29/2001   moderate on right lateral abdomen Nino Glow)   Atypical mole 11/29/2001   moderate on right forearm Nino Glow)   Atypical mole 01/11/2007   moderate on lower mid back   Atypical mole 07/14/2008   SK and atypical solar lentigo on left jawline (widershave)   Basal cell carcinoma 07/29/1996   back left lower ear - CX3+excision   Basal cell carcinoma 09/22/2004   basosquamous on right tip of nose (MOHs)   Basal cell carcinoma 10/20/2005   superficial on upper center back - CX3+5FU   Basal cell carcinoma 10/20/2011   right upper back - tx p bx   Basal cell carcinoma 02/07/2018   superficial/nodular on left upper arm - CX3+cautery+5FU   Basal cell carcinoma 10/23/2018   infiltrative on left sideburn [facility])   Benign prostate hyperplasia    Cataract    left and removed   Depression    Depression    Diverticulosis    Erectile dysfunction    Hyperlipidemia    Hypertension    Impaired fasting glucose    Nocturia    Personal history of colonic polyps 04/19/2005   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   Left Temporal Scalp (well diff)   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   left post auricular - clear at visit on 12/28/2001   Squamous cell carcinoma of skin 10/08/2003   well differentiated below outer left eye - MOHs   Squamous cell carcinoma of skin 06/02/2008   well differentiated behind left ear   Squamous cell carcinoma of skin 10/20/2011   KA on left elbow   Squamous  cell carcinoma of skin 03/02/2015   well differentiated on right outer cheek - CX3+excision   Squamous cell carcinoma of skin 03/01/2017   well differentiated on left forearm - tx p bx   Squamous cell carcinoma of skin 03/07/2018   moderately differentiated on left jawline - CX3+excision   Past Surgical History:  Procedure Laterality Date   CATARACT EXTRACTION     COLONOSCOPY     OPEN REDUCTION INTERNAL FIXATION (ORIF) METACARPAL  09/24/2012   Procedure: OPEN REDUCTION INTERNAL FIXATION (ORIF) METACARPAL;  Surgeon: Sharma Covert, MD;  Location: [facility] OR;  Service: Orthopedics;  Laterality: Left;  and proximal phalanges.   surgery for skin cancer     Family History  Problem Relation Age of Onset   Cancer Other        family history    Colon cancer Neg Hx    Colon polyps Neg Hx    Rectal cancer Neg Hx    Stomach cancer Neg Hx    Social History:  reports that he has never smoked. He has quit using smokeless tobacco. He reports that he does not currently use alcohol. He reports that he does not use drugs. Allergies:  Allergies  Allergen Reactions   Terazosin Hcl     Dizziness and hypotension   Medications Prior to Admission  Medication Sig Dispense Refill   acetaminophen (TYLENOL) 325  MG tablet Take 2 tablets (650 mg total) by mouth every 6 (six) hours as needed for mild pain (or Fever >/= 101). 12 tablet 0   ALPRAZolam (XANAX) 0.5 MG tablet Take 0.5 mg by mouth 2 (two) times daily as needed for anxiety.     amLODipine (NORVASC) 10 MG tablet Take 10 mg by mouth daily.     apixaban (ELIQUIS) 5 MG TABS tablet Take 1 tablet (5 mg total) by mouth 2 (two) times daily. Blood thinner for stroke prevention 60 tablet 5   brimonidine-timolol (COMBIGAN) 0.2-0.5 % ophthalmic solution Place 1 drop into the left eye daily.     buPROPion (WELLBUTRIN XL) 300 MG 24 hr tablet Take 300 mg by mouth daily.     Cholecalciferol (VITAMIN D3) 3000 UNITS TABS Take 1 tablet by mouth daily.      clopidogrel  (PLAVIX) 75 MG tablet Take 75 mg by mouth daily.     cyanocobalamin (VITAMIN B12) 100 MCG tablet Take 100 mcg by mouth daily.     escitalopram (LEXAPRO) 10 MG tablet Take 10 mg by mouth daily.     metoprolol succinate (TOPROL-XL) 25 MG 24 hr tablet Take 25 mg by mouth daily.     nitroGLYCERIN (NITROSTAT) 0.4 MG SL tablet Place 0.4 mg under the tongue every 5 (five) minutes as needed for chest pain.     Omega-3 Fatty Acids (FISH OIL PO) Take 1 capsule by mouth daily.      pantoprazole (PROTONIX) 40 MG tablet Take 40 mg by mouth daily.     rosuvastatin (CRESTOR) 40 MG tablet Take 40 mg by mouth daily.     sacubitril-valsartan (ENTRESTO) 24-26 MG Take 1 tablet by mouth daily.     vitamin C (ASCORBIC ACID) 500 MG tablet Take 1 tablet (500 mg total) by mouth daily. 90 tablet 0    Home: Home Living Family/patient expects to be discharged to:: Private residence Living Arrangements: Spouse/significant other Available Help at Discharge: Family, Available 24 hours/day Type of Home: House Home Access: Stairs to enter [employer] of Steps: 3-4 Entrance Stairs-Rails: Right Home Layout: One level, Able to live on main level with bedroom/bathroom, Laundry or work area in basement, [employer] on main level Bathroom Shower/Tub: Walk-in shower Bathroom Accessibility: Yes Home Equipment: Agricultural consultant (2 wheels), Shower seat - built in, [employer] held shower head, Grab bars - tub/shower Additional Comments: Pt lives with wife, available 24/7, son can assist after work  Functional History: Prior Function Prior Level of Function : Independent/Modified Independent, Driving, Working/employed Mobility Comments: Ind, farmer - livestock ADLs Comments: ind Functional Status:  Mobility: Bed Mobility Overal bed mobility: Needs Assistance Bed Mobility: Rolling, Sidelying to Sit, Sit to Supine Rolling: Mod assist Sidelying to sit: Mod assist Supine to sit: Mod assist, HOB elevated, Used rails Sit to  supine: Mod assist, +2 for physical assistance General bed mobility comments: assist for trunk elevation/lowering, LLE progression to EOB, boost up in bed upon return to supine. Transfers Overall transfer level: Needs assistance Equipment used: Rolling walker (2 wheels) Transfers: Sit to/from Stand Sit to Stand: Mod assist General transfer comment: assist for power up, rise, steadying. Stand tolerance x3 minutes while RN changing pt bedding, pre-gait tasks including weight shifting  and forward steppng with LLE Ambulation/Gait General Gait Details: unable this date    ADL: ADL Overall ADL's : Needs assistance/impaired Eating/Feeding: Independent Grooming: Set up, Sitting Upper Body Bathing: Set up, Sitting Lower Body Bathing: Moderate assistance, Sitting/lateral leans Upper Body Dressing :  Set up, Sitting Lower Body Dressing: Maximal assistance, Sit to/from stand Toilet Transfer: Moderate assistance, Rolling walker (2 wheels), BSC/3in1 General ADL Comments: Pt mod A for pivot transfer to [facility], poor stability with LLE. Pt max A for LB dressing, set up for UB ADLs.  Cognition: Cognition Overall Cognitive Status: Within Functional Limits for tasks assessed Orientation Level: Oriented X4 Cognition Arousal: Alert Behavior During Therapy: WFL for tasks assessed/performed Overall Cognitive Status: Within Functional Limits for tasks assessed  Blood pressure (!) 136/94, pulse 91, temperature 98.4 F (36.9 C), temperature source Oral, resp. rate 18, height 5\' 10"  (1.778 m), weight 97.7 kg, SpO2 97%. Physical Exam  PE: Constitution: Appropriate appearance for age. No apparent distress  +Obese Resp: No respiratory distress. No accessory muscle usage. CTAB and on 2 L [place] Cardio: Well perfused appearance.  Irregular rhythm with regular rate.  No obvious or murmurs, rubs, or gallops.  Palpable peripheral pulses.  Brisk capillary refill in all 4 extremities.  2+ edema isolated to left  ankle. Abdomen: Nondistended. Nontender.  Positive bowel sounds. Psych: Appropriate mood and affect. Neuro: AAOx4. No apparent cognitive deficits.  Skin: Scattered edema and bruising apparent along right lower extremity; with open areas covered by ABDs and extension brace.  + IV intact  Neurologic Exam:   DTRs: Reflexes were 2+ in bilateral biceps, BR and triceps; + left patella, Achilles Babinsky: flexor responses b/l.   Hoffmans: negative b/l Sensory exam: revealed normal sensation in all dermatomal regions in bilateral upper extremities, right lower extremity, and with reduced sensation to light touch in left lower extremity from the proximal left thigh to midfoot Motor exam: strength 5/5 throughout bilateral upper extremities, right lower extremity, and with exception of left lower extremity muscle activation at thigh, 3 out of 5 ankle dorsiflexion and plantarflexion Coordination: Fine motor coordination was normal.      Results for orders placed or performed during the hospital encounter of 09/19/23 (from the past 24 hour(s))  CK     Status: Abnormal   Collection Time: 09/22/23  5:27 AM  Result Value Ref Range   Total CK 10,049 (H) 49 - 397 U/L  Comprehensive metabolic panel     Status: Abnormal   Collection Time: 09/22/23  5:27 AM  Result Value Ref Range   Sodium 136 135 - 145 mmol/L   Potassium 3.4 (L) 3.5 - 5.1 mmol/L   Chloride 100 98 - 111 mmol/L   CO2 25 22 - 32 mmol/L   Glucose, Bld 98 70 - 99 mg/dL   BUN 44 (H) 8 - 23 mg/dL   Creatinine, Ser 4.09 (H) 0.61 - 1.24 mg/dL   Calcium 7.4 (L) 8.9 - 10.3 mg/dL   Total Protein 4.7 (L) 6.5 - 8.1 g/dL   Albumin 2.2 (L) 3.5 - 5.0 g/dL   AST 811 (H) 15 - 41 U/L   ALT 45 (H) 0 - 44 U/L   Alkaline Phosphatase 36 (L) 38 - 126 U/L   Total Bilirubin 0.6 0.3 - 1.2 mg/dL   GFR, Estimated 9 (L) >60 mL/min   Anion gap 11 5 - 15   US RENAL  Result Date: 09/22/2023 CLINICAL DATA:  [id number] with acute kidney injury. EXAM: RENAL /  URINARY TRACT ULTRASOUND COMPLETE COMPARISON:  Chest, abdomen and pelvis CT with contrast 2 days ago 09/19/2023. FINDINGS: Right Kidney: Renal measurements: 10.9 x 4.9 x 4.3 cm = volume: 120.3 mL. There is mild increased cortical echogenicity relative to the liver with slight cortical thinning. No  mass, stones or hydronephrosis visualized. Left Kidney: Renal measurements: 11.1 x 5.7 x 4.6 cm = volume: 150.3 mL. Echogenicity is mildly increased but without notable cortical thinning. No mass, stones or hydronephrosis visualized. Bladder: Contracted and obscured by bowel gas. Other: None. IMPRESSION: 1. No hydronephrosis or visible caliceal stones. 2. Mild increased cortical echogenicity bilaterally, with slight cortical thinning on the right. This can be seen with medical renal disease. 3. Contracted bladder obscured by bowel gas. Electronically Signed   By: Almira Bar M.D.   On: 09/22/2023 01:59   CT TIBIA FIBULA LEFT WO CONTRAST  Result Date: 09/21/2023 CLINICAL DATA:  Left leg injury. Known fractures of the proximal tibia and fibula EXAM: CT OF THE LOWER LEFT EXTREMITY WITHOUT CONTRAST TECHNIQUE: Multidetector CT imaging of the lower left extremity was performed according to the standard protocol. RADIATION DOSE REDUCTION: This exam was performed according to the departmental dose-optimization program which includes automated exposure control, adjustment of the mA and/or kV according to patient size and/or use of iterative reconstruction technique. COMPARISON:  CT left knee 09/20/2023 FINDINGS: Bones/Joint/Cartilage Redemonstration of comminuted minimally displaced tibial plateau fracture involving the tibial eminence. Nondisplaced fracture of the fibular head. Mildly displaced cortical avulsion fracture at the MCL attachment site of the medial femoral condyle. No new fractures. The distal aspects of the tibia and fibula are intact. Knee and ankle joints are aligned. Medial compartment osteoarthritis of  the knee. Moderate-sized knee joint lipohemarthrosis. Ligaments Suboptimally assessed by CT. Muscles and Tendons No acute musculotendinous abnormality by CT. There are areas of fatty atrophy of the gastrocnemius musculature. Soft tissues Circumferential soft tissue swelling with areas of ill-defined hematoma at the knee. Soft tissue edema of the mid to distal lower leg without organized fluid collection. Small Baker's cyst. Atherosclerotic vascular calcifications. IMPRESSION: 1. Redemonstration of comminuted minimally displaced tibial plateau fracture, nondisplaced fracture of the fibular head, and mildly displaced cortical avulsion fracture at the MCL attachment site of the medial femoral condyle. 2. No new fractures of the left lower leg. 3. Moderate-sized knee joint lipohemarthrosis. 4. Circumferential soft tissue swelling with areas of ill-defined hematoma at the knee. Electronically Signed   By: Duanne Guess D.O.   On: 09/21/2023 16:00   CT FEMUR LEFT WO CONTRAST  Result Date: 09/21/2023 CLINICAL DATA:  Left leg pain EXAM: CT OF THE LOWER LEFT EXTREMITY WITHOUT CONTRAST TECHNIQUE: Multidetector CT imaging of the lower left extremity was performed according to the standard protocol. RADIATION DOSE REDUCTION: This exam was performed according to the departmental dose-optimization program which includes automated exposure control, adjustment of the mA and/or kV according to patient size and/or use of iterative reconstruction technique. COMPARISON:  X-ray 01/31/2021, knee CT 09/20/2023 FINDINGS: Bones/Joint/Cartilage Acute cortical avulsion fracture involving the peripheral cortex of the medial femoral condyle at the MCL attachment site. Remainder of the left femur is intact. Fractures of the proximal tibia and fibula, as seen on previous CT. No new fractures. No malalignment. Tricompartmental osteoarthritis of the knee, most pronounced at the medial compartment. Moderate size knee joint lipohemarthrosis.  Ligaments Suboptimally assessed by CT. Muscles and Tendons No acute musculotendinous abnormality of the thigh. Soft tissues Diffuse soft tissue swelling throughout the thigh with ill-defined subcutaneous hematoma about the knee. There is a small volume of hemorrhage tracking along the deep fascial planes of the posterior compartment of the distal thigh (series 5, image 176). No inguinal lymphadenopathy. Atherosclerotic vascular calcification. IMPRESSION: 1. Acute cortical avulsion fracture involving the peripheral cortex of the medial femoral  condyle at the [facility] attachment site. No additional fractures of the left femur. 2. Fractures of the proximal tibia and fibula, as seen on previous CT. 3. Moderate size knee joint lipohemarthrosis. 4. Diffuse soft tissue swelling throughout the thigh with ill-defined subcutaneous hematoma about the knee. There is a small volume of hemorrhage tracking along the deep fascial planes within the posterior compartment of the distal thigh. Electronically Signed   By: Duanne Guess D.O.   On: 09/21/2023 15:53    Assessment/Plan: Diagnosis: Debility secondary to trauma from crush injury to left leg, complicated by left femoral fracture managed nonoperatively and renal failure due to rhabdomyolysis Does the need for close, 24 hr/day medical supervision in concert with the patient's rehab needs make it unreasonable for this patient to be served in a less intensive setting? Yes Co-Morbidities requiring supervision/potential complications: AKI on CKD currently on continuous IV/drips, likely transition to HD; acute hypoxic respiratory failure on new oxygen; acute on chronic CHF exacerbation; atrial fibrillation with RVR transitioning to oral amiodarone with AC contraindicated due to left leg hematoma; poor pain control; poor p.o. intakes Due to bladder management, bowel management, safety, skin/wound care, disease management, medication administration, pain management, and patient  education, does the patient require 24 hr/day rehab nursing? Yes Does the patient require coordinated care of a physician, rehab nurse, therapy disciplines of PT, OT to address physical and functional deficits in the context of the above medical diagnosis(es)? Yes Addressing deficits in the following areas: balance, endurance, locomotion, strength, transferring, bowel/bladder control, bathing, dressing, feeding, grooming, toileting, cognition, and psychosocial support Can the patient actively participate in an intensive therapy program of at least 3 hrs of therapy per day at least 5 days per week? Potentially The potential for patient to make measurable gains while on inpatient rehab is good Anticipated functional outcomes upon discharge from inpatient rehab are supervision  with PT, supervision with OT Estimated rehab length of stay to reach the above functional goals is: 10-14 days Anticipated discharge destination: Home Overall Rehab/Functional Prognosis: good  POST ACUTE RECOMMENDATIONS: This patient's condition is appropriate for continued rehabilitative care in the following setting:  CIR Patient has agreed to participate in recommended program. Yes Note that insurance prior authorization may be required for reimbursement for recommended care.  Comment: Mr. Stigger is a 77 year old male presenting to the hospital after a crush injury to his left leg while changing a tire.  He has had a medically complicated stay, and currently is not medically ready for rehab as he remains on a continuous bicarb drip and is having HD catheter placed today for likely initiation of dialysis due to uptrending creatinine.  He additionally from a cardiac standpoint would need to to transition off of continuous monitoring, and currently A-fib is poorly controlled with heart rate documented into the 150s with PT mobilization yesterday.  However, once medically stable he has multiple medical comorbidities requiring  physician oversight and the need for intensive PT/OT therapies in order to return to a functional level that can be accommodated by his wife, which would need to be primarily contact-guard/supervision.  Family is supportive and patient is motivated to participate in inpatient rehab with goal to return home.   Barriers to inpatient rehabilitation: Need for continuous monitor cardiac monitoring, IV pain medications, continuous bicarbonate drip; would also prefer definitive plan on hemodialysis and surgical versus nonsurgical management of femoral fracture prior to transition, although this is not strictly necessary.  MEDICAL RECOMMENDATIONS: Continue encouraging early mobilization with patient out  of bed to chair at least 30 minutes daily to prevent further deconditioning Will consult nutrition for calorie count and consider initiation of an appetite stimulant, as patient endorses minimal p.o. intakes which will complicate healing and ability to tolerate functional activity    I have personally performed a face to face diagnostic evaluation of this patient. Additionally, I have examined the patient's medical record including any pertinent labs and radiographic images. If the physician assistant has documented in this note, I have reviewed and edited or otherwise concur with the physician assistant's documentation.  Thanks,  Angelina Sheriff, DO 09/22/2023

## 2023-09-22 NOTE — Progress Notes (Signed)
Admit: 09/19/2023 LOS: 2  35M AoCKD3b with traumatic rhabdomyolysis, likely contrast nephrotoxicity  Subjective:  Creatinine worsened from 4.7-6.0, K3.4, Only 0.3 L urine output documented CK down trended to 10,000 Renal ultrasound with normal-sized kidneys bilaterally, no obstruction or stone noted.  Findings suggestive of medical renal disease.  10/31 0701 - 11/01 0700 In: 3158.6 [I.V.:3158.6] Out: 300 [Urine:300]  Filed Weights   09/19/23 1957 09/21/23 0300 09/22/23 0500  Weight: 85.3 kg 93 kg 97.7 kg    Scheduled Meds:  sodium chloride   Intravenous Once   sodium chloride   Intravenous Once   amiodarone  200 mg Oral Daily   brimonidine  1 drop Left Eye Daily   And   timolol  1 drop Left Eye Daily   Chlorhexidine Gluconate Cloth  6 each Topical Daily   metoprolol succinate  25 mg Oral Daily   sodium chloride flush  3 mL Intravenous Q12H   Continuous Infusions:  sodium bicarbonate 150 mEq in sterile water 1,150 mL infusion 125 mL/hr at 09/22/23 0403   PRN Meds:.acetaminophen **OR** acetaminophen, HYDROmorphone (DILAUDID) injection, nitroGLYCERIN, ondansetron (ZOFRAN) IV, oxyCODONE, senna-docusate, sodium chloride flush  Current Labs: reviewed    Physical Exam:  Blood pressure 119/70, pulse 89, temperature 98 F (36.7 C), temperature source Oral, resp. rate 17, height 5\' 10"  (1.778 m), weight 97.7 kg, SpO2 98%. Lying in bed, NAD Irregular rhythm, normal rate, normal S1 and S2, no rub Clear bilaterally on anterior auscultation No peripheral edema Soft, nontender  A AKI on CKD 3B secondary to rhabdomyolysis and contrast nephrotoxicity Traumatic rhabdomyolysis, CK levels proving slowly; on hydration with bicarbonate IVF's Permanent atrial fibrillation with RVR, followed by cardiology, on amiodarone.  Apixaban on hold CAD with PCI placed about 2 months ago, per cardiology Close left femoral condyle avulsion fracture, tibial plateau fracture, and brace currently,  orthopedics following Hypertension but blood pressures are stable  P With oliguria and worsening creatinine will ask IR to help with temporary HD catheter for potential HD required tomorrow Will reduce IV fluids because of oliguria, continue bicarbonate drip Continue to trend CK Hold Entresto Medication Issues; Preferred narcotic agents for pain control are hydromorphone, fentanyl, and methadone. Morphine should not be used.  Baclofen should be avoided Avoid oral sodium phosphate and magnesium citrate based laxatives / bowel preps    Sabra Heck MD 09/22/2023, 10:35 AM  Recent Labs  Lab 09/20/23 0752 09/21/23 0428 09/22/23 0527  NA 139 137 136  K 3.2* 4.0 3.4*  CL 106 104 100  CO2 20* 20* 25  GLUCOSE 172* 120* 98  BUN 27* 36* 44*  CREATININE 3.21* 4.71* 6.03*  CALCIUM 7.5* 7.4* 7.4*   Recent Labs  Lab 09/19/23 1912 09/19/23 1918 09/20/23 0752 09/21/23 0428  WBC 16.1*  --  10.2 8.7  HGB 12.4* 12.2* 12.3* 10.0*  HCT 37.9* 36.0* 36.0* 29.9*  MCV 89.4  --  88.0 90.3  PLT 232  --  170 153

## 2023-09-22 NOTE — Plan of Care (Signed)
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
## 2023-09-22 NOTE — Care Management Important Message (Signed)
Important Message  Patient Details  Name: Jorge West MRN: [id number] Date of Birth: [birth year]   Important Message Given:  Yes - Medicare IM     Sherilyn Banker 09/22/2023, 2:05 PM

## 2023-09-22 NOTE — Procedures (Signed)
Interventional Radiology Procedure Note  Procedure: Placement of a right IJ approach triple lumen trialysis catheter.  Tip is positioned at the superior cavoatrial junction and catheter is ready for immediate use.   Complications: None  Recommendations:  - OK to use - Do not submerge - Routine wound and line care   Signed,  Yvone Neu. Loreta Ave, DO

## 2023-09-23 DIAGNOSIS — I2510 Atherosclerotic heart disease of native coronary artery without angina pectoris: Secondary | ICD-10-CM

## 2023-09-23 DIAGNOSIS — I4891 Unspecified atrial fibrillation: Secondary | ICD-10-CM | POA: Diagnosis not present

## 2023-09-23 DIAGNOSIS — I10 Essential (primary) hypertension: Secondary | ICD-10-CM | POA: Diagnosis not present

## 2023-09-23 DIAGNOSIS — I5033 Acute on chronic diastolic (congestive) heart failure: Secondary | ICD-10-CM | POA: Diagnosis not present

## 2023-09-23 DIAGNOSIS — R570 Cardiogenic shock: Secondary | ICD-10-CM | POA: Diagnosis not present

## 2023-09-23 LAB — RENAL FUNCTION PANEL
Albumin: 2 g/dL — ABNORMAL LOW (ref 3.5–5.0)
Anion gap: 13 (ref 5–15)
BUN: 53 mg/dL — ABNORMAL HIGH (ref 8–23)
CO2: 25 mmol/L (ref 22–32)
Calcium: 7.5 mg/dL — ABNORMAL LOW (ref 8.9–10.3)
Chloride: 96 mmol/L — ABNORMAL LOW (ref 98–111)
Creatinine, Ser: 7.12 mg/dL — ABNORMAL HIGH (ref 0.61–1.24)
GFR, Estimated: 7 mL/min — ABNORMAL LOW (ref >60)
Glucose, Bld: 93 mg/dL (ref 70–99)
Phosphorus: 6.4 mg/dL — ABNORMAL HIGH (ref 2.5–4.6)
Potassium: 3.2 mmol/L — ABNORMAL LOW (ref 3.5–5.1)
Sodium: 134 mmol/L — ABNORMAL LOW (ref 135–145)

## 2023-09-23 LAB — CBC
HCT: 24.5 % — ABNORMAL LOW (ref 39.0–52.0)
Hemoglobin: 8.5 g/dL — ABNORMAL LOW (ref 13.0–17.0)
MCH: 29.6 pg (ref 26.0–34.0)
MCHC: 34.7 g/dL (ref 30.0–36.0)
MCV: 85.4 fL (ref 80.0–100.0)
Platelets: 140 10*3/uL — ABNORMAL LOW (ref 150–400)
RBC: 2.87 MIL/uL — ABNORMAL LOW (ref 4.22–5.81)
RDW: 12.7 % (ref 11.5–15.5)
WBC: 6.3 10*3/uL (ref 4.0–10.5)
nRBC: 0 % (ref 0.0–0.2)

## 2023-09-23 LAB — CK: Total CK: 7902 U/L — ABNORMAL HIGH (ref 49–397)

## 2023-09-23 LAB — HEPATITIS B SURFACE ANTIGEN: Hepatitis B Surface Ag: NONREACTIVE

## 2023-09-23 LAB — MAGNESIUM: Magnesium: 1.9 mg/dL (ref 1.7–2.4)

## 2023-09-23 MED ORDER — ALTEPLASE 2 MG IJ SOLR: 2.0000 mg | Freq: Once | INTRAMUSCULAR | Status: DC | PRN

## 2023-09-23 MED ORDER — ALPRAZOLAM 0.5 MG PO TABS
0.5000 mg | ORAL_TABLET | Freq: Three times a day (TID) | ORAL | Status: DC | PRN
  Administered 2023-09-23 – 2023-10-29 (×43): 0.5 mg via ORAL
  Filled 2023-09-23 (×44): qty 1

## 2023-09-23 MED ORDER — HEPARIN SODIUM (PORCINE) 1000 UNIT/ML DIALYSIS
1000.0000 [IU] | INTRAMUSCULAR | Status: DC | PRN
  Administered 2023-09-23: 2600 [IU]
  Filled 2023-09-23 (×2): qty 1

## 2023-09-23 MED ORDER — ANTICOAGULANT SODIUM CITRATE 4% (200MG/5ML) IV SOLN: 5.0000 mL | Status: DC | PRN

## 2023-09-23 MED ORDER — CLOPIDOGREL BISULFATE 75 MG PO TABS
75.0000 mg | ORAL_TABLET | Freq: Every day | ORAL | Status: DC
  Administered 2023-09-23 – 2023-10-31 (×39): 75 mg via ORAL
  Filled 2023-09-23 (×40): qty 1

## 2023-09-23 NOTE — Progress Notes (Signed)
Patient sent for hemodialysis at 1330, report given to the RN.

## 2023-09-23 NOTE — Assessment & Plan Note (Signed)
Multivessel coronary artery disease, with recent DES to OM1 in September at [place].  Ck is trending down and no clinical signs of worsening leg hematoma. Will resume clopidogrel with close monitoring, signs of bleeding into left leg injury.
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
## 2023-09-23 NOTE — Progress Notes (Signed)
Admit: 09/19/2023 LOS: 3  22M AoCKD3b with traumatic rhabdomyolysis, likely contrast nephrotoxicity  Subjective:  Received right IJ temporary HD catheter with IR yesterday, appreciate their assistance 0.4 L urine output yesterday with ongoing hydration Creatinine increased to 7.1, K3.2, bicarbonate 25 CK downtrending to 7900 Wife at bedside, updated  11/01 0701 - 11/02 0700 In: 100 [P.O.:100] Out: 425 [Urine:425]  Filed Weights   09/21/23 0300 09/22/23 0500 09/23/23 0500  Weight: 93 kg 97.7 kg 97.4 kg    Scheduled Meds:  sodium chloride   Intravenous Once   sodium chloride   Intravenous Once   amiodarone  200 mg Oral Daily   brimonidine  1 drop Left Eye Daily   And   timolol  1 drop Left Eye Daily   Chlorhexidine Gluconate Cloth  6 each Topical Daily   metoprolol succinate  25 mg Oral Daily   sodium chloride flush  3 mL Intravenous Q12H   Continuous Infusions:  sodium bicarbonate 150 mEq in sterile water 1,150 mL infusion 50 mL/hr at 09/22/23 1954   PRN Meds:.acetaminophen **OR** acetaminophen, ALPRAZolam, HYDROmorphone (DILAUDID) injection, nitroGLYCERIN, ondansetron (ZOFRAN) IV, oxyCODONE, senna-docusate, sodium chloride flush  Current Labs: reviewed    Physical Exam:  Blood pressure (!) 142/90, pulse (!) 121, temperature 97.7 F (36.5 C), temperature source Oral, resp. rate 20, height 5\' 10"  (1.778 m), weight 97.4 kg, SpO2 97%. Lying in bed, NAD Irregular rhythm, normal rate, normal S1 and S2, no rub Clear bilaterally on anterior auscultation No peripheral edema Soft, nontender  A Oliguric AKI on CKD 3B secondary to rhabdomyolysis and contrast nephrotoxicity Traumatic rhabdomyolysis, CK levels proving slowly; on hydration with bicarbonate IVF's Permanent atrial fibrillation with RVR, followed by cardiology, on amiodarone.  Apixaban on hold CAD with PCI placed about 2 months ago, per cardiology Close left femoral condyle avulsion fracture, tibial plateau  fracture, and brace currently, orthopedics following Hypertension but blood pressures are stable  P HD #1 today: 4K bath, no heparin, 1 L UF Continue low-dose sodium bicarbonate IV fluids to facilitate clearance of CK Hold Entresto Medication Issues; Preferred narcotic agents for pain control are hydromorphone, fentanyl, and methadone. Morphine should not be used.  Baclofen should be avoided Avoid oral sodium phosphate and magnesium citrate based laxatives / bowel preps    Sabra Heck MD 09/23/2023, 9:38 AM  Recent Labs  Lab 09/21/23 0428 09/22/23 0527 09/23/23 0500  NA 137 136 134*  K 4.0 3.4* 3.2*  CL 104 100 96*  CO2 20* 25 25  GLUCOSE 120* 98 93  BUN 36* 44* 53*  CREATININE 4.71* 6.03* 7.12*  CALCIUM 7.4* 7.4* 7.5*  PHOS  --   --  6.4*   Recent Labs  Lab 09/19/23 1912 09/19/23 1918 09/20/23 0752 09/21/23 0428  WBC 16.1*  --  10.2 8.7  HGB 12.4* 12.2* 12.3* 10.0*  HCT 37.9* 36.0* 36.0* 29.9*  MCV 89.4  --  88.0 90.3  PLT 232  --  170 153

## 2023-09-23 NOTE — Progress Notes (Signed)
Patient received from Hemodialysis, v/s monitored, CCMD notified, call bell in reach.  09/23/23 1700  Vitals  Temp 98.5 F (36.9 C)  Temp Source Oral  BP (!) 150/95  MAP (mmHg) 112  BP Location Right Arm  BP Method Automatic  Patient Position (if appropriate) Lying  Pulse Rate (!) 106  Pulse Rate Source Monitor  ECG Heart Rate (!) 106  Resp 20  MEWS COLOR  MEWS Score Color Green  Oxygen Therapy  O2 Device Nasal Cannula  O2 Flow Rate (L/min) 2 L/min  Pain Assessment  Pain Scale 0-10  Pain Score 0  Glasgow Coma Scale  Eye Opening 4  Best Verbal Response (NON-intubated) 5  Best Motor Response 6  Glasgow Coma Scale Score 15  MEWS Score  MEWS Temp 0  MEWS Systolic 0  MEWS Pulse 1  MEWS RR 0  MEWS LOC 0  MEWS Score 1

## 2023-09-23 NOTE — Plan of Care (Signed)
?  Problem: Clinical Measurements: ?Goal: Ability to maintain clinical measurements within normal limits will improve ?Outcome: Progressing ?Goal: Will remain free from infection ?Outcome: Progressing ?Goal: Diagnostic test results will improve ?Outcome: Progressing ?  ?

## 2023-09-23 NOTE — Progress Notes (Signed)
Progress Note  Patient Name: Jorge West Date of Encounter: 09/23/2023  Primary Cardiologist: Orvilla Fus, MD ([facility])  Interval Summary   Feels weak, left leg not painful but cannot move it in large brace.  No chest pain or sense of palpitations.  Vital Signs    Vitals:   09/23/23 0500 09/23/23 0810 09/23/23 0812 09/23/23 0905  BP:  (!) 127/101 (!) 142/90 (!) 142/90  Pulse:  (!) 123 (!) 104 (!) 121  Resp:  20 20   Temp:  97.7 F (36.5 C)    TempSrc:  Oral    SpO2:  96% 97%   Weight: 97.4 kg     Height:        Intake/Output Summary (Last 24 hours) at 09/23/2023 0918 Last data filed at 09/23/2023 0400 Gross per 24 hour  Intake 100 ml  Output 425 ml  Net -325 ml   Filed Weights   09/21/23 0300 09/22/23 0500 09/23/23 0500  Weight: 93 kg 97.7 kg 97.4 kg    Physical Exam   GEN: No acute distress.   Neck: No JVD. Cardiac: Irregularly irregular without gallop gallop.  Respiratory: Nonlabored. Clear to auscultation bilaterally. GI: Soft, nontender, bowel sounds present. MS: Leg immobilizer/brace on the left. Neuro:  Nonfocal. Psych: Alert and oriented x 3. Normal affect.  ECG/Telemetry    Telemetry reviewed showing atrial fibrillation, heart rate low 100s.  Labs    Chemistry Recent Labs  Lab 09/20/23 0752 09/21/23 0428 09/22/23 0527 09/23/23 0500  NA 139 137 136 134*  K 3.2* 4.0 3.4* 3.2*  CL 106 104 100 96*  CO2 20* 20* 25 25  GLUCOSE 172* 120* 98 93  BUN 27* 36* 44* 53*  CREATININE 3.21* 4.71* 6.03* 7.12*  CALCIUM 7.5* 7.4* 7.4* 7.5*  PROT 5.6* 4.8* 4.7*  --   ALBUMIN 2.6* 2.2* 2.2* 2.0*  AST 275* 233* 198*  --   ALT 47* 47* 45*  --   ALKPHOS 35* 31* 36*  --   BILITOT 1.0 0.6 0.6  --   GFRNONAA 19* 12* 9* 7*  ANIONGAP 13 13 11 13     Hematology Recent Labs  Lab 09/19/23 1912 09/19/23 1918 09/20/23 0752 09/21/23 0428  WBC 16.1*  --  10.2 8.7  RBC 4.24  --  4.09* 3.31*  HGB 12.4* 12.2* 12.3* 10.0*  HCT 37.9*  36.0* 36.0* 29.9*  MCV 89.4  --  88.0 90.3  MCH 29.2  --  30.1 30.2  MCHC 32.7  --  34.2 33.4  RDW 12.5  --  13.8 14.1  PLT 232  --  170 153   Cardiac Enzymes Recent Labs  Lab 09/20/23 0056 09/20/23 0247 09/20/23 0618 09/20/23 0752  TROPONINIHS 55* 76* 163* 217*    Cardiac Studies   Echocardiogram 08/24/2023 ([facility]): Left Ventricle: There is mild basal septal asymmetric hypertrophy with  no LVOT gradient.    Left Ventricle: Systolic function is mildly abnormal. EF: 40-45%.  Quantitative analysis of left ventricular Global Longitudinal Strain (GLS)  imaging is -9.200%. Ejection fraction measured by 3D is 48%, which is  abnormal.   Left Ventricle: There is mild  hypokinesis of the left ventricle.    Aortic Valve: The aortic valve is tricuspid. The leaflets are not  thickened and exhibit normal excursion.    Mitral Valve: There is mild regurgitation.    Tricuspid Valve: There is trace regurgitation.    Tricuspid Valve: The right ventricular systolic pressure is normal (<36  mmHg).  Pericardium: There is no pericardial effusion.   Assessment & Plan   1.  Longstanding persistent atrial fibrillation with mild RVR, heart rate complicated by current comorbid state.  Currently on oral amiodarone and Toprol-XL.  CHA2DS2-VASc score is 5.  Eliquis is currently on hold in light of left medial femoral condyle avulsion fracture with rhabdomyolysis and concern for potential compartment syndrome.  2.  Multivessel CAD with CTO RCA status post DES to OM1 at [facility] in September.  Plavix is currently on hold as well. High-sensitivity troponin I levels are not diagnostic of ACS (demand ischemia).  3.  HFmrEF, LVEF 40 to 45% by echocardiogram at [facility] in October.  4.  Rhabdomyolysis, total CK peaked at 1978[address].  5.  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  Creatinine up to 7.12 with potassium 3.2.  Has tunneled HD catheter in place and followed by nephrology.  6.  Primary  hypertension.  Currently on amiodarone 200 mg daily and Toprol XL 25 mg daily.  Plavix and Eliquis remain on hold.  Main question will be stability of his left leg injury in terms of subcutaneous hematoma and potential to develop compartment syndrome.  When this has stabilized would likely recommend resuming antiplatelet therapy first and then if he remains stable anticoagulation thereafter.  For questions or updates, please contact [facility] Please consult [email or web address] for contact info under   Signed, Nona Dell, MD  09/23/2023, 9:18 AM
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
## 2023-09-23 NOTE — Progress Notes (Signed)
Received patient in bed to unit.  Alert and oriented.  Informed consent signed and in chart.   TX duration:2.5  Patient tolerated well.  Transported back to the room  Alert, without acute distress.  Hand-off given to patient's nurse.   Access used: right [facility]  Access issues: none  Total UF removed: 1L Medication(s) given: none   09/23/23 1629  Vitals  Temp (!) 97.3 F (36.3 C)  Temp Source Oral  BP (!) 141/100  MAP (mmHg) 113  BP Location Right Arm  BP Method Automatic  Patient Position (if appropriate) Lying  Pulse Rate (!) 117  Pulse Rate Source Monitor  ECG Heart Rate (!) 104  Resp (!) 23  Oxygen Therapy  SpO2 99 %  O2 Device Nasal Cannula  O2 Flow Rate (L/min) 2 L/min  During Treatment Monitoring  Blood Flow Rate (mL/min) 250 mL/min  Arterial Pressure (mmHg) -82.62 mmHg  Venous Pressure (mmHg) 101 mmHg  TMP (mmHg) 2.83 mmHg  Ultrafiltration Rate (mL/min) 682 mL/min  Dialysate Flow Rate (mL/min) 299 ml/min  Duration of HD Treatment -hour(s) 2.5 hour(s)  Cumulative Fluid Removed (mL) per Treatment  1000.19  HD Safety Checks Performed Yes  Intra-Hemodialysis Comments Tx completed;Tolerated well  Dialysis Fluid Bolus Normal Saline  Bolus Amount (mL) 300 mL      Jerin Franzel S [facility]

## 2023-09-23 NOTE — Progress Notes (Signed)
Called to get nursing report for dialysis nurse said she will call me back later

## 2023-09-24 DIAGNOSIS — I4819 Other persistent atrial fibrillation: Secondary | ICD-10-CM | POA: Diagnosis not present

## 2023-09-24 DIAGNOSIS — N1832 Chronic kidney disease, stage 3b: Secondary | ICD-10-CM

## 2023-09-24 DIAGNOSIS — R570 Cardiogenic shock: Secondary | ICD-10-CM | POA: Diagnosis not present

## 2023-09-24 DIAGNOSIS — I5033 Acute on chronic diastolic (congestive) heart failure: Secondary | ICD-10-CM | POA: Diagnosis not present

## 2023-09-24 DIAGNOSIS — N179 Acute kidney failure, unspecified: Secondary | ICD-10-CM | POA: Diagnosis not present

## 2023-09-24 DIAGNOSIS — I4891 Unspecified atrial fibrillation: Secondary | ICD-10-CM | POA: Diagnosis not present

## 2023-09-24 LAB — RENAL FUNCTION PANEL
Albumin: 2 g/dL — ABNORMAL LOW (ref 3.5–5.0)
Anion gap: 16 — ABNORMAL HIGH (ref 5–15)
BUN: 44 mg/dL — ABNORMAL HIGH (ref 8–23)
CO2: 28 mmol/L (ref 22–32)
Calcium: 7.8 mg/dL — ABNORMAL LOW (ref 8.9–10.3)
Chloride: 93 mmol/L — ABNORMAL LOW (ref 98–111)
Creatinine, Ser: 6.04 mg/dL — ABNORMAL HIGH (ref 0.61–1.24)
GFR, Estimated: 9 mL/min — ABNORMAL LOW (ref >60)
Glucose, Bld: 98 mg/dL (ref 70–99)
Phosphorus: 5.2 mg/dL — ABNORMAL HIGH (ref 2.5–4.6)
Potassium: 3.4 mmol/L — ABNORMAL LOW (ref 3.5–5.1)
Sodium: 137 mmol/L (ref 135–145)

## 2023-09-24 LAB — CK: Total CK: 4884 U/L — ABNORMAL HIGH (ref 49–397)

## 2023-09-24 NOTE — Progress Notes (Signed)
Progress Note  Patient Name: Jorge West Date of Encounter: 09/24/2023  Primary Cardiologist: Orvilla Fus, MD ([facility])  Interval Summary   Resting this morning but does awaken to voice.  Tolerated hemodialysis yesterday.  Vital Signs    Vitals:   09/24/23 0430 09/24/23 0600 09/24/23 0840 09/24/23 0946  BP: 90/73  116/76 116/76  Pulse: 92  91 91  Resp: 14  17   Temp: 98 F (36.7 C)  98 F (36.7 C)   TempSrc: Oral  Oral   SpO2: 96%  97%   Weight:  100.5 kg    Height:        Intake/Output Summary (Last 24 hours) at 09/24/2023 1026 Last data filed at 09/24/2023 0946 Gross per 24 hour  Intake 2874.3 ml  Output 1725 ml  Net 1149.3 ml   Filed Weights   09/23/23 1341 09/23/23 1638 09/24/23 0600  Weight: 102.1 kg 98 kg 100.5 kg    Physical Exam   GEN: No acute distress.   Neck: No JVD. Cardiac: Irregularly irregular without gallop.  Respiratory: Nonlabored.  Clear to auscultation anteriorly. GI: Soft, nontender, bowel sounds present. MS: Leg immobilizer/brace on left. Neuro:  Nonfocal. Psych: Alert and oriented x 3. Normal affect.  ECG/Telemetry    Telemetry reviewed showing atrial fibrillation.  Labs    Chemistry Recent Labs  Lab 09/20/23 0752 09/21/23 0428 09/22/23 0527 09/23/23 0500 09/24/23 0808  NA 139 137 136 134* 137  K 3.2* 4.0 3.4* 3.2* 3.4*  CL 106 104 100 96* 93*  CO2 20* 20* 25 25 28   GLUCOSE 172* 120* 98 93 98  BUN 27* 36* 44* 53* 44*  CREATININE 3.21* 4.71* 6.03* 7.12* 6.04*  CALCIUM 7.5* 7.4* 7.4* 7.5* 7.8*  PROT 5.6* 4.8* 4.7*  --   --   ALBUMIN 2.6* 2.2* 2.2* 2.0* 2.0*  AST 275* 233* 198*  --   --   ALT 47* 47* 45*  --   --   ALKPHOS 35* 31* 36*  --   --   BILITOT 1.0 0.6 0.6  --   --   GFRNONAA 19* 12* 9* 7* 9*  ANIONGAP 13 13 11 13  16*    Hematology Recent Labs  Lab 09/20/23 0752 09/21/23 0428 09/23/23 1350  WBC 10.2 8.7 6.3  RBC 4.09* 3.31* 2.87*  HGB 12.3* 10.0* 8.5*  HCT 36.0* 29.9* 24.5*   MCV 88.0 90.3 85.4  MCH 30.1 30.2 29.6  MCHC 34.2 33.4 34.7  RDW 13.8 14.1 12.7  PLT 170 153 140*   Cardiac Enzymes Recent Labs  Lab 09/20/23 0056 09/20/23 0247 09/20/23 0618 09/20/23 0752  TROPONINIHS 55* 76* 163* 217*    Cardiac Studies   Echocardiogram 08/24/2023 ([facility]): Left Ventricle: There is mild basal septal asymmetric hypertrophy with  no LVOT gradient.    Left Ventricle: Systolic function is mildly abnormal. EF: 40-45%.  Quantitative analysis of left ventricular Global Longitudinal Strain (GLS)  imaging is -9.200%. Ejection fraction measured by 3D is 48%, which is  abnormal.   Left Ventricle: There is mild  hypokinesis of the left ventricle.    Aortic Valve: The aortic valve is tricuspid. The leaflets are not  thickened and exhibit normal excursion.    Mitral Valve: There is mild regurgitation.    Tricuspid Valve: There is trace regurgitation.    Tricuspid Valve: The right ventricular systolic pressure is normal (<36  mmHg).   Pericardium: There is no pericardial effusion.   Assessment & Plan  1.  Longstanding persistent atrial fibrillation with intermittent RVR, heart rate complicated by current comorbid state.  Currently on oral amiodarone and Toprol-XL.  CHA2DS2-VASc score is 5.  Eliquis is currently on hold in light of left medial femoral condyle avulsion fracture with rhabdomyolysis and concern for potential compartment syndrome.  2.  Multivessel CAD with CTO RCA status post DES to OM1 at [facility] in September.  High-sensitivity troponin I levels are not diagnostic of ACS (demand ischemia).  Plavix has been on hold, although resumed this morning by primary team.  3.  HFmrEF, LVEF 40 to 45% by echocardiogram at [facility] in October.  4.  Rhabdomyolysis, total CK peaked at 1978[address].  5.  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  Has tunneled HD catheter in place and followed by nephrology.  Tolerated hemodialysis session yesterday.  6.  Primary  hypertension.  Blood pressure stable.  Continue amiodarone 200 mg daily and Toprol-XL 25 mg daily.  Plavix resumed today by primary team.  Would make sure that he tolerates this well without any bleeding prior to considering resumption of Eliquis.  For questions or updates, please contact [facility] Please consult [email or web address] for contact info under   Signed, Nona Dell, MD  09/24/2023, 10:26 AM

## 2023-09-24 NOTE — Plan of Care (Signed)

## 2023-09-24 NOTE — Progress Notes (Signed)
Admit: 09/19/2023 LOS: 4  76M AoCKD3b with traumatic rhabdomyolysis, likely contrast nephrotoxicity  Subjective:  HD #1 yesterday, 1L UF 0.4L UOP yesterday Remains on HCO3 gtt at 66mL/h Weak still, esp in LEs CK downtrending Wife at bedside, updated  11/02 0701 - 11/03 0700 In: 240 [P.O.:240] Out: 1400 [Urine:400]  Filed Weights   09/23/23 1341 09/23/23 1638 09/24/23 0600  Weight: 102.1 kg 98 kg 100.5 kg    Scheduled Meds:  sodium chloride   Intravenous Once   sodium chloride   Intravenous Once   amiodarone  200 mg Oral Daily   brimonidine  1 drop Left Eye Daily   And   timolol  1 drop Left Eye Daily   Chlorhexidine Gluconate Cloth  6 each Topical Daily   clopidogrel  75 mg Oral Daily   metoprolol succinate  25 mg Oral Daily   sodium chloride flush  3 mL Intravenous Q12H   Continuous Infusions:  sodium bicarbonate 150 mEq in sterile water 1,150 mL infusion 50 mL/hr at 09/24/23 0726   PRN Meds:.acetaminophen **OR** acetaminophen, ALPRAZolam, HYDROmorphone (DILAUDID) injection, nitroGLYCERIN, ondansetron (ZOFRAN) IV, oxyCODONE, senna-docusate, sodium chloride flush  Current Labs: reviewed    Physical Exam:  Blood pressure 116/76, pulse 91, temperature 98 F (36.7 C), temperature source Oral, resp. rate 17, height 5\' 10"  (1.778 m), weight 100.5 kg, SpO2 97%. Lying in bed, NAD Irregular rhythm, normal rate, normal S1 and S2, no rub Clear bilaterally on anterior auscultation No peripheral edema Soft, nontender  A Oliguric AKI on CKD 3B secondary to rhabdomyolysis and contrast nephrotoxicity R internal jugular Temp HD cath 11/1 with IR HD #1 09/23/23 Traumatic rhabdomyolysis, CK levels proving slowly; on hydration with bicarbonate IVF's Permanent atrial fibrillation with RVR, followed by cardiology, on amiodarone.  Apixaban on hold CAD with PCI placed about 2 months ago, per cardiology Closed left femoral condyle avulsion fracture, tibial plateau fracture, and brace  currently, orthopedics following Hypertension but blood pressures are stable Anemia, worsening,   P Daily assessment of UOP, BUN, Cr, K for HD needs; none today No HD orders until MD assess each day Continue low-dose sodium bicarbonate IV fluids to facilitate clearance of CK -- UOP suggests should keep stable vol status Hold Entresto Medication Issues; Preferred narcotic agents for pain control are hydromorphone, fentanyl, and methadone. Morphine should not be used.  Baclofen should be avoided Avoid oral sodium phosphate and magnesium citrate based laxatives / bowel preps    Sabra Heck MD 09/24/2023, 10:27 AM  Recent Labs  Lab 09/22/23 0527 09/23/23 0500 09/24/23 0808  NA 136 134* 137  K 3.4* 3.2* 3.4*  CL 100 96* 93*  CO2 25 25 28   GLUCOSE 98 93 98  BUN 44* 53* 44*  CREATININE 6.03* 7.12* 6.04*  CALCIUM 7.4* 7.5* 7.8*  PHOS  --  6.4* 5.2*   Recent Labs  Lab 09/20/23 0752 09/21/23 0428 09/23/23 1350  WBC 10.2 8.7 6.3  HGB 12.3* 10.0* 8.5*  HCT 36.0* 29.9* 24.5*  MCV 88.0 90.3 85.4  PLT 170 153 140*
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
## 2023-09-25 ENCOUNTER — Encounter (HOSPITAL_COMMUNITY): Payer: Medicare PPO

## 2023-09-25 DIAGNOSIS — I5033 Acute on chronic diastolic (congestive) heart failure: Secondary | ICD-10-CM | POA: Diagnosis not present

## 2023-09-25 DIAGNOSIS — N1832 Chronic kidney disease, stage 3b: Secondary | ICD-10-CM | POA: Diagnosis not present

## 2023-09-25 DIAGNOSIS — N179 Acute kidney failure, unspecified: Secondary | ICD-10-CM | POA: Diagnosis not present

## 2023-09-25 DIAGNOSIS — I4891 Unspecified atrial fibrillation: Secondary | ICD-10-CM | POA: Diagnosis not present

## 2023-09-25 DIAGNOSIS — R570 Cardiogenic shock: Secondary | ICD-10-CM | POA: Diagnosis not present

## 2023-09-25 LAB — COMPREHENSIVE METABOLIC PANEL
ALT: 37 U/L (ref 0–44)
AST: 104 U/L — ABNORMAL HIGH (ref 15–41)
Albumin: 1.7 g/dL — ABNORMAL LOW (ref 3.5–5.0)
Alkaline Phosphatase: 35 U/L — ABNORMAL LOW (ref 38–126)
Anion gap: 10 (ref 5–15)
BUN: 55 mg/dL — ABNORMAL HIGH (ref 8–23)
CO2: 31 mmol/L (ref 22–32)
Calcium: 7.6 mg/dL — ABNORMAL LOW (ref 8.9–10.3)
Chloride: 93 mmol/L — ABNORMAL LOW (ref 98–111)
Creatinine, Ser: 6.87 mg/dL — ABNORMAL HIGH (ref 0.61–1.24)
GFR, Estimated: 8 mL/min — ABNORMAL LOW (ref >60)
Glucose, Bld: 99 mg/dL (ref 70–99)
Potassium: 3.1 mmol/L — ABNORMAL LOW (ref 3.5–5.1)
Sodium: 134 mmol/L — ABNORMAL LOW (ref 135–145)
Total Bilirubin: 1.3 mg/dL — ABNORMAL HIGH (ref <1.2)
Total Protein: 4.3 g/dL — ABNORMAL LOW (ref 6.5–8.1)

## 2023-09-25 LAB — CK: Total CK: 2422 U/L — ABNORMAL HIGH (ref 49–397)

## 2023-09-25 MED ORDER — RENA-VITE PO TABS
1.0000 | ORAL_TABLET | Freq: Every day | ORAL | Status: DC
  Administered 2023-09-25 – 2023-10-15 (×20): 1 via ORAL
  Filled 2023-09-25 (×21): qty 1

## 2023-09-25 MED ORDER — ENSURE ENLIVE PO LIQD
237.0000 mL | ORAL | Status: DC
  Administered 2023-09-26 – 2023-10-02 (×5): 237 mL via ORAL

## 2023-09-25 MED ORDER — SODIUM CHLORIDE 0.9 % IV SOLN: INTRAVENOUS | Status: AC

## 2023-09-25 MED ORDER — POTASSIUM CHLORIDE 20 MEQ PO PACK
40.0000 meq | PACK | Freq: Once | ORAL | Status: AC
  Administered 2023-09-25: 40 meq via ORAL
  Filled 2023-09-25: qty 2

## 2023-09-25 MED ORDER — BOOST / RESOURCE BREEZE PO LIQD CUSTOM
1.0000 | ORAL | Status: DC
  Administered 2023-09-25 – 2023-10-01 (×5): 1 via ORAL

## 2023-09-25 NOTE — Discharge Instructions (Addendum)
High-Calorie, High-Protein Nutrition Therapy (2021) A high-calorie, high-protein diet has been recommended to you. Your registered dietitian nutritionist (RDN) may have recommended this diet because you are having difficulty eating enough calories throughout the day, you have lost weight, and/or you need to add protein to your diet. Sometimes you may not feel like eating, even if you know the importance of good nutrition. The recommendations in this handout can help you with the following: Regaining your strength and energy Keeping your body healthy Healing and recovering from surgery or illness and fighting infection Tips: Schedule Your Meals and Snacks Several small meals and snacks are often better tolerated and digested than large meals. Strategies Plan to eat 3 meals and 3 snacks daily. Experiment with timing meals to find out when you have a larger appetite. Appetite may be greatest in the morning after not eating all night so you may prefer to eat your larger meals and snacks in the morning and at lunch. Breakfast-type foods are often better tolerated so eat foods such as eggs, pancakes, waffles and cereal for any meal or snack. Carry snacks with you so you are prepared to eat every 2 to 3 hours. Determine what works best for you if your body's cues for feeling hungry or full are not working. Eat a small meal or snack even if you don't feel hungry. Set a timer to remind you when it is time to eat. Take a walk before you eat (with health care provider's approval). Light or moderate physical activity can help you maintain muscle and increase your appetite. Make Eating Enjoyable Taking steps to make the experience enjoyable may help to increase your interest in eating and improve your appetite. Strategies: Eat with others whenever possible. Include your favorite foods to make meals more enjoyable. Try new foods. Save your beverage for the end of the meal so that you have more room for  food before you get full. Add Calories to Your Meals and Snacks Try adding calorie-dense foods so that each bite provides more nutrition. Strategies Drink milk, chocolate milk, soy milk, or smoothies instead of low-calorie beverages such as diet drinks or water. Cook with milk or soy milk instead of water when making dishes such as hot cereal, cocoa, or pudding. Add jelly, jam, honey, butter or margarine to bread and crackers. Add jam or fruit to ice cream and as a topping over cake. Mix dried fruit, nuts, granola, honey, or dry cereal with yogurt or hot cereals. Enjoy snacks such as milkshakes, smoothies, pudding, ice cream, or custard. Blend a fruit smoothie of a banana, frozen berries, milk or soy milk, and 1 tablespoon nonfat powdered milk or protein powder. Add Protein to Your Meals and Snacks Choose at least one protein food at each meal and snack to increase your daily intake. Strategies Add  cup nonfat dry milk powder or protein powder to make a high-protein milk to drink or to use in recipes that call for milk. Vanilla or peppermint extract or unsweetened cocoa powder could help to boost the flavor. Add hard-cooked eggs, leftover meat, grated cheese, canned beans or tofu to noodles, rice, salads, sandwiches, soups, casseroles, pasta, tuna and other mixed dishes. Add powdered milk or protein powder to hot cereals, meatloaf, casseroles, scrambled eggs, sauces, cream soups, and shakes. Add beans and lentils to salads, soups, casseroles, and vegetable dishes. Eat cottage cheese or yogurt, especially Greek yogurt, with fruit as a snack or dessert. Eat peanut or other nut butters on crackers, bread, toast,  waffles, apples, bananas or celery sticks. Add it to milkshakes, smoothies, or desserts. Consider a ready-made protein shake. Your RDN will make recommendations. Add Fats to Your Meals and Snacks Try adding fats to your meals and snacks. Fat provides more calories in fewer bites than  carbohydrate or protein and adds flavors to your foods. Strategies Snack on nuts and seeds or add them to foods like salads, pasta, cereals, yogurt, and ice cream.  Saut or stir-fry vegetables, meats, chicken, fish or tofu in olive or canola oil.  Add olive oil, other vegetable oils, butter or margarine to soups, vegetables, potatoes, cooked cereal, rice, pasta, bread, crackers, pancakes, or waffles. Snack on olives or add to pasta, pizza, or salad. Add avocado or guacamole to your salads, sandwiches, and other entrees. Include fatty fish such as salmon in your weekly meal plan. For general food safety tips, especially for clients with immunocompromised conditions, ask your RDN for the Food Safety Nutrition Therapy handout. Small Meal and Snack Ideas These snacks and meals are recommended when you have to eat but aren't necessarily hungry.  They are good choices because they are high in protein and high in calories.  2 graham crackers 2 tablespoons peanut or other nut butter 1 cup milk 2 slices whole wheat toast topped with:  avocado, mashed Seasoning of your choice   cup Greek yogurt  cup fruit  cup granola 2 deviled egg halves 5 whole wheat crackers  1 cup cream of tomato soup  grilled cheese sandwich 1 toasted waffle topped with: 2 tablespoons peanut or nut butter 1 tablespoon jam  Trail mix made with:  cup nuts  cup dried fruit  cup cold cereal, any variety  cup oatmeal or cream of wheat cereal 1 tablespoon peanut or nut butter  cup diced fruit   High-Calorie, High-Protein Sample 1-Day Menu View Nutrient Info Breakfast 1 egg, scrambled 1 ounce cheddar cheese 1 English muffin, whole wheat 1 tablespoon margarine 1 tablespoon jam  cup orange juice, fortified with calcium and vitamin D  Morning Snack 1 tablespoon peanut butter 1 banana 1 cup 1% milk  Lunch Tuna salad sandwich made with: 2 slices bread, whole wheat 3 ounces tuna mixed with: 1 tablespoon  mayonnaise  cup pudding  Afternoon Snack  cup hummus  cup carrots 1 pita  Evening Meal Enchilada casserole made with: 2 corn tortillas 3 ounces ground beef, cooked  cup black beans, cooked  cup corn, cooked 1 ounce grated cheddar cheese  cup enchilada sauce  avocado, sliced, topping for enchilada 1 tablespoon sour cream, topping for enchilada Salad:  cup lettuce, shredded  cup tomatoes, chopped, for salad 1 tablespoon olive oil and vinegar dressing, for salad  Evening Snack  cup Greek yogurt  cup blueberries  cup granola   Information on my medicine - ELIQUIS (apixaban)  This medication education was reviewed with me or my healthcare representative as part of my discharge preparation.    Why was Eliquis prescribed for you? Eliquis was prescribed for you to reduce the risk of a blood clot forming that can cause a stroke if you have a medical condition called atrial fibrillation (a type of irregular heartbeat).  What do You need to know about Eliquis ? Take your Eliquis TWICE DAILY - one tablet in the morning and one tablet in the evening with or without food. If you have difficulty swallowing the tablet whole please discuss with your pharmacist how to take the medication safely.  Take Eliquis exactly as prescribed  by your doctor and DO NOT stop taking Eliquis without talking to the doctor who prescribed the medication.  Stopping may increase your risk of developing a stroke.  Refill your prescription before you run out.  After discharge, you should have regular check-up appointments with your healthcare provider that is prescribing your Eliquis.  In the future your dose may need to be changed if your kidney function or weight changes by a significant amount or as you get older.  What do you do if you miss a dose? If you miss a dose, take it as soon as you remember on the same day and resume taking twice daily.  Do not take more than one dose of ELIQUIS at the  same time to make up a missed dose.  Important Safety Information A possible side effect of Eliquis is bleeding. You should call your healthcare provider right away if you experience any of the following: Bleeding from an injury or your nose that does not stop. Unusual colored urine (red or dark brown) or unusual colored stools (red or black). Unusual bruising for unknown reasons. A serious fall or if you hit your head (even if there is no bleeding).  Some medicines may interact with Eliquis and might increase your risk of bleeding or clotting while on Eliquis. To help avoid this, consult your healthcare provider or pharmacist prior to using any new prescription or non-prescription medications, including herbals, vitamins, non-steroidal anti-inflammatory drugs (NSAIDs) and supplements.  This website has more information on Eliquis (apixaban): http://www.eliquis.com/eliquis/home

## 2023-09-25 NOTE — Plan of Care (Signed)

## 2023-09-25 NOTE — Progress Notes (Signed)
Progress Note   Patient: Jorge West:[id number] DOB: 1946-08-18 DOA: 09/19/2023     5 DOS: the patient was seen and examined on 09/25/2023   Brief hospital course: Mr. Layson was admitted to the hospital with the working diagnosis of left femoral condyle avulsion fracture, T12 anterior wedge compression fracture.   77 yo male with the past medical history of heart failure, atrial fibrillation, coronary artery disease, paroxysmal atrial fibrillation, hypertension, CKD and BPH who presented with trauma. He was changing a tire on is car and it fell on his leg. Apparently he was trapped for about 2 hrs until his family found him and called EMS. On his initial physical examination his blood pressure was 98/76, HR 133, RR 24 and 02 saturation 99%, lungs with no wheezing or rales, heart with S1 and S2 present irregularly irregular with no gallops, rubs or murmurs, abdomen with no distention, no lower extremity edema. Shortened left lower extremity.   Na 140, K 3,4 Cl 106 bicarbonate 21, glucose 203, bun 20 cr 2.36  AST 88, ALT 23.  Ck 7,242, 10.725, 5,852. 19.782.  BNP 983 High sensitive troponin 55, 76, 163, 217  Lactic acid 4,8  Wbc 16,1 hgb 12.2 plt 232  Alcohol < 10   Head and cervical spine CT with no acute changes. No acute displaced fracture or traumatic listhesis of the cervical spine. 1.9 cm right thyroid nodule. (Recommend thyroid US).   CT chest, abdomen and pelvis, with age indeterminate, possible acute T12 anterior wedge compression fracture. No acute intrathoracic, intra abdominal, intra pelvic traumatic injury.  No acute fracture or traumatic malalignment of the thoracic spine.   CT left lower extremity with left medial femoral condyle avulsion fracture.  Asymmetric enlargement of the left mid to lower thigh musculature compared to the right. Associated left distal thigh, knee, proximal leg subcutaneous soft tissue edema.   Chest radiograph with cardiomegaly with no  effusions or infiltrates.   Pelvic radiograph with no acute traumatic injury.   EKG 139 bpm, left axis deviation, qtc 556, atrial fibrillation rhythm with ST depression in V4 to V6, with no significant T wave changes.   Patient placed on IV fluids and IV amiodarone.  10/31 Ck trending down, continue to have elevated serum cr and left lower extremity pain.  11/01 worsening renal function, with oliguric renal failure.  Consulted nephrology and plan for tunneled HD cathter for possible HD in case continue to worsen renal function.  11/02 renal replacement therapy. Resumed clopidogrel.  11/03 feeling better, but very weak and deconditioned, poor oral intake.  11/04 continue with oliguria.   Assessment and Plan: * Acute kidney injury superimposed on stage 3b chronic kidney disease (HCC) Hypokalemia, Rhabdomyolysis. Lactic acidosis.  Urinary retention.  ATN likely due to multiple mechanisms, related to rhabdomyolysis.   11/01 tunneled HD cathter placed.   Urinary output today is 325 ml 11/02 HD with ultrafiltration 1000 ml  Systolic blood pressure 101 to 127 mmHg.   Elevated liver enzymes due to hypoperfusion, have been trending down.   Renal function with serum cr at 6.87, BUN 55, Na 134, K 3.1 and bicarbonate 31.  Ck is down to 2,422  Considering high serum bicarbonate, low Na and low Cl, will change from bicarbonate drip to normal saline at 50 ml per hr.    Follow up renal function and Ck.  Patient may need repeat renal replacement therapy with ultrafiltration today.   Atrial fibrillation with RVR (HCC) Continue amiodarone for rate control.  No anticoagulation due to hematoma at left leg.  Continue telemetry monitoring.    Acute on chronic diastolic CHF (congestive heart failure) (HCC) Echocardiogram with preserved LV systolic function with EF 60- to 65%, mild LVH, RV with normal systolic function, no significant valvular disease.   Today with signs of hypervolemia.    Continue amiodarone for rate control atrial fibrillation.  Continue metoprolol.  Continue to hold RAAS inhibition due to unstable GFR.   Acute hypoxemic respiratory failure, with 02 saturation 97% on 2 L/min per [place].   Essential hypertension Continue blood pressure monitoring.  Continue metoprolol.  Holding amlodipine and entresto until renal function and blood pressure more stable.   Closed left femoral condyle avulsion fracture (HCC) T12 compression fracture.   Follow up left lower extremity CT  Diffuse soft tissue swelling throughout the thigh with ill defined subcutaneous hematoma about the knee. There is small volume hemorrhage tracking along the deep fascial planes of the posterior compartment of the distal thigh.   Hold on direct oral anticoagulant for now.   Continue pain control and physical therapy.   Coronary artery disease Multivessel coronary artery disease, with recent DES to OM1 in September at [place].  Ck is trending down and no clinical signs of worsening leg hematoma. Continue with clopidogrel with close monitoring, signs of bleeding into left leg injury.   Anemia of chronic disease Follow up hgb is 8,5  Follow cell count in am.         Subjective: Patient feeling tired and fatigue today, no chest pain, denies significant dyspnea.   Physical Exam: Vitals:   09/25/23 0500 09/25/23 0755 09/25/23 0758 09/25/23 0809  BP:   127/84 127/84  Pulse: (!) 112 (!) 125 (!) 125 (!) 123  Resp: 18     Temp:   (!) 97.5 F (36.4 C)   TempSrc:   Oral   SpO2: 95%     Weight: 47 kg     Height:       Neurology deconditioned and ill looking appearing, fatigued. Responding to questions and following commands ENT with mild pallor Cardiovascular with S1 and S2 present, irregularly irregular with no gallops, rubs or murmurs Mild JVD Positive left lower extremity edema, trace on the right side Respiratory with rales bilaterally with no wheezing, scattered  rhonchi Abdomen with no distention Left leg with brace in place  Data Reviewed:    Family Communication: I spoke with patient's wife at the bedside, we talked in detail about patient's condition, plan of care and prognosis and all questions were addressed.    Disposition: Status is: Inpatient Remains inpatient appropriate because: renal replacement therapy   Planned Discharge Destination: Home     Author: Coralie Keens, MD 09/25/2023 11:26 AM  For on call review [email or web address].

## 2023-09-25 NOTE — Progress Notes (Signed)
Initial Nutrition Assessment  DOCUMENTATION CODES:   Non-severe (moderate) malnutrition in context of chronic illness  INTERVENTION:  Continue regular diet as ordered; ordering with assistance for pt to select desired food items Encourage wife to bring foods from home pt would enjoy Ensure Enlive po once daily, each supplement provides 350 kcal and 20 grams of protein. Boost Breeze po once daily, each supplement provides 250 kcal and 9 grams of protein Magic cup TID with meals, each supplement provides 290 kcal and 9 grams of protein Renal MVI with minerals daily "High calorie, high protein nutrition therapy" handout added to AVS  NUTRITION DIAGNOSIS:   Moderate Malnutrition related to chronic illness (heart failure, afib) as evidenced by mild fat depletion, mild muscle depletion.  GOAL:   Patient will meet greater than or equal to 90% of their needs  MONITOR:   PO intake, Labs, Weight trends, Skin  REASON FOR ASSESSMENT:   Consult Assessment of nutrition requirement/status  ASSESSMENT:   Pt admitted with L femoral condyle avulsion fracture, T12 anterior wedge compression fracture sustained while he was changing a tire on his car and it fell on his leg. PMH significant for heart failure, afib, CAD, paroxysmal afib, HTN, CKD and BPH.   11/1 [facility] placement 11/2 1st iHD session- net UF 1L  Spoke with pt's wife at bedside as pt resting soundly. She states that since his cardiac ablation 4 years ago his appetite and po intake has declined significantly. He is sustaining his nutrition on 4 Nutty Buddy wafers and 2 cans of Dr. Reino Kent daily. She will prepare something that he enjoys but when the food is ready he does not have the taste for it. Pt's wife has been making protein shakes with protein powder at home but he does not like these. He is a very selective eater. Food preferences obtained. She reports that d/t his loose dentures, he needs softer textured food items.   Reviewed  menu with pt's wife. She does not suspect there are many items on the menu that he will eat given decreased appetite and limited intake of foods he used to enjoy. Encouraged her to provide foods from home that he may desire to promote increased nutritional intake.  Pt's wife states that his weight has not changed significantly though did not provide usual weight.   Post HD bed weight: 98 kg (11/2) Question accuracy of pt's documented weight his morning as it is significantly less than that of prior weights. In addition, this weight does not compare with findings on nutrition focused physical exam.   Unfortunately, there is limited documentation of weights on file to review over the last year.   Medications: reviewed  Labs: sodium 134, potassium 3.1, BUN 55, Cr 6.87, alkaline phos 35, AST 104, GFR 8  UOP: 450 x3 hours I/O's: +7910ml since admit  NUTRITION - FOCUSED PHYSICAL EXAM:  Flowsheet Row Most Recent Value  Orbital Region Mild depletion  Upper Arm Region Moderate depletion  Thoracic and Lumbar Region No depletion  Buccal Region Moderate depletion  Temple Region No depletion  Clavicle Bone Region Mild depletion  Clavicle and Acromion Bone Region No depletion  Scapular Bone Region No depletion  Dorsal Hand Moderate depletion  Patellar Region Unable to assess  [L leg stabilizer]  Anterior Thigh Region Unable to assess  Posterior Calf Region Unable to assess  Edema (RD Assessment) None  Hair Reviewed  Eyes Reviewed  Mouth Reviewed  Skin Reviewed  Nails Reviewed  Diet Order:   Diet Order             Diet regular Fluid consistency: Thin  Diet effective now                   EDUCATION NEEDS:   Education needs have been addressed  Skin:  Skin Assessment: Reviewed RN Assessment  Last BM:  11/2  Height:   Ht Readings from Last 1 Encounters:  09/19/23 5\' 10"  (1.778 m)    Weight:   Wt Readings from Last 1 Encounters:  09/25/23 47 kg    Ideal  Body Weight:  75.5 kg  BMI:  Body mass index is 14.88 kg/m.  Estimated Nutritional Needs:   Kcal:  1800-2000  Protein:  95-110g  Fluid:  1L + UOP  Drusilla Kanner, RDN, LDN Clinical Nutrition

## 2023-09-25 NOTE — Progress Notes (Signed)
Patient Name: Jorge West Date of Encounter: 09/25/2023 [facility] Cardiologist: Orvilla Fus, MD   Interval Summary  .    Patient admits to significant fatigue this morning but says that he is generally feeling okay. Denies cardiovascular symptoms including angina, shortness of breath, palpitations, lightheadedness/dizziness.  Vital Signs .    Vitals:   09/25/23 0342 09/25/23 0500 09/25/23 0755 09/25/23 0758  BP: 111/89   127/84  Pulse: (!) 131 (!) 112 (!) 125 (!) 125  Resp: 17 18    Temp: 97.6 F (36.4 C)   (!) 97.5 F (36.4 C)  TempSrc: Oral   Oral  SpO2: 97% 95%    Weight:  47 kg    Height:        Intake/Output Summary (Last 24 hours) at 09/25/2023 0805 Last data filed at 09/24/2023 1400 Gross per 24 hour  Intake 363 ml  Output 325 ml  Net 38 ml      09/25/2023    5:00 AM 09/24/2023    6:00 AM 09/23/2023    4:38 PM  Last 3 Weights  Weight (lbs) 103 lb 11.2 oz 221 lb 8 oz 216 lb 0.8 oz  Weight (kg) 47.038 kg 100.472 kg 98 kg      Telemetry/ECG    Persistent atrial fibrillation, ventricular rates slightly increased this morning 115-130 from 100-110bpm - Personally Reviewed  Physical Exam .   GEN: No acute distress.   Neck: No JVD Cardiac: Irregularly irregular, no murmurs, rubs, or gallops.  Respiratory: Clear to auscultation bilaterally. GI: Soft, nontender, non-distended  MS: left leg in brace with generalized swelling  Assessment & Plan .     Longstanding persistent atrial fibrillation Acute RVR   Patient with prior afib ablation, has also previously failed AAD therapy. RVR in the settting of shock, acute limb injury.   Rates do not appear to be as well controlled today. Given tenuous status with recent hypotension/hypovolemic shock, favor ongoing Amiodarone while admitted. Continue 200mg  PO daily with potential to discontinue on discharge. Just prior to my exam, patient was cleaned/moved by staff and rates do appear to  be settling now that patient is resting again. He also just received his AM medications about 30 min prior to my evaluation. If ventricular rates continue to sustain above 120, would increase Toprol XL from 25mg ->50mg . Eliquis continues to be held with medial femoral condyle avulsion fracture with rhabdomyolysis. Resume anti-coagulation as soon as safe to do so   CAD   Patient with catheterization 07/26/2023 with multivessel disease with CTO of his RCA. Underwent DES to OM1.    As below, troponin elevation not consistent with ACS. Plavix held by primary team early this admission but resumed 09/24/23. Statin held with acute liver injury secondary to rhabdomyolysis.   HFmrEF   LVEF 40-45% per 08/24/23 TTE with [facility].    Patient remains euvolemic appearing on exam. Will need close monitoring of volume status with ongoing IVF and progressive AKI/limited urine output.  Hold GDMT Entresto with AKI, soft BP.    Elevated troponin   Patient admitted following a crush injury of left medial femoral condyle. CK coming [place] [address].Troponin 55->76->163->217. Transaminases generally trending down.    Troponin elevation not consistent with ACS but rather systemic inflammation secondary to rhabdomyolysis and demand ischemia with hypovolemic shock.  Continue IV fluids to support renal function. Will need to monitor respiratory status closely with HFmrEF.    Per primary team: Tranaminitis Rhabdomyolysis Left  medial femoral condyle avulsion fracture For questions or updates, please contact [facility] Please consult [email or web address] for contact info under        Signed, Perlie Gold, PA-C
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
## 2023-09-25 NOTE — Progress Notes (Signed)
Inpatient Rehab Admissions Coordinator:   For iHD today.  Continuing to follow for possible CIR.   Estill Dooms, PT, DPT Admissions Coordinator [phone] 09/25/23  4:01 PM

## 2023-09-25 NOTE — Progress Notes (Signed)
Educated pt how to use IS and its benefits. Pt verbalized understanding and performed it correctly.   Lawson Radar, RN

## 2023-09-26 DIAGNOSIS — N179 Acute kidney failure, unspecified: Secondary | ICD-10-CM | POA: Diagnosis not present

## 2023-09-26 DIAGNOSIS — I4891 Unspecified atrial fibrillation: Secondary | ICD-10-CM | POA: Diagnosis not present

## 2023-09-26 DIAGNOSIS — I5033 Acute on chronic diastolic (congestive) heart failure: Secondary | ICD-10-CM | POA: Diagnosis not present

## 2023-09-26 DIAGNOSIS — R570 Cardiogenic shock: Secondary | ICD-10-CM | POA: Diagnosis not present

## 2023-09-26 DIAGNOSIS — E440 Moderate protein-calorie malnutrition: Secondary | ICD-10-CM | POA: Diagnosis present

## 2023-09-26 LAB — CBC
HCT: 27.9 % — ABNORMAL LOW (ref 39.0–52.0)
Hemoglobin: 9.7 g/dL — ABNORMAL LOW (ref 13.0–17.0)
MCH: 29.5 pg (ref 26.0–34.0)
MCHC: 34.8 g/dL (ref 30.0–36.0)
MCV: 84.8 fL (ref 80.0–100.0)
Platelets: 201 10*3/uL (ref 150–400)
RBC: 3.29 MIL/uL — ABNORMAL LOW (ref 4.22–5.81)
RDW: 12.8 % (ref 11.5–15.5)
WBC: 7.1 10*3/uL (ref 4.0–10.5)
nRBC: 0 % (ref 0.0–0.2)

## 2023-09-26 LAB — RENAL FUNCTION PANEL
Albumin: 1.8 g/dL — ABNORMAL LOW (ref 3.5–5.0)
Anion gap: 11 (ref 5–15)
BUN: 65 mg/dL — ABNORMAL HIGH (ref 8–23)
CO2: 30 mmol/L (ref 22–32)
Calcium: 7.9 mg/dL — ABNORMAL LOW (ref 8.9–10.3)
Chloride: 93 mmol/L — ABNORMAL LOW (ref 98–111)
Creatinine, Ser: 7.23 mg/dL — ABNORMAL HIGH (ref 0.61–1.24)
GFR, Estimated: 7 mL/min — ABNORMAL LOW (ref >60)
Glucose, Bld: 95 mg/dL (ref 70–99)
Phosphorus: 5.4 mg/dL — ABNORMAL HIGH (ref 2.5–4.6)
Potassium: 3.2 mmol/L — ABNORMAL LOW (ref 3.5–5.1)
Sodium: 134 mmol/L — ABNORMAL LOW (ref 135–145)

## 2023-09-26 LAB — HEPATITIS B SURFACE ANTIBODY, QUANTITATIVE: Hep B S AB Quant (Post): <3.5 m[IU]/mL — ABNORMAL LOW

## 2023-09-26 LAB — CK: Total CK: 1692 U/L — ABNORMAL HIGH (ref 49–397)

## 2023-09-26 MED ORDER — APIXABAN 5 MG PO TABS
5.0000 mg | ORAL_TABLET | Freq: Two times a day (BID) | ORAL | Status: DC
  Administered 2023-09-26 – 2023-10-04 (×17): 5 mg via ORAL
  Filled 2023-09-26 (×17): qty 1

## 2023-09-26 MED ORDER — HEPARIN SODIUM (PORCINE) 1000 UNIT/ML IJ SOLN
2600.0000 [IU] | Freq: Once | INTRAMUSCULAR | Status: AC
  Administered 2023-09-26: 2600 [IU]
  Filled 2023-09-26: qty 3
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
## 2023-09-26 NOTE — Assessment & Plan Note (Signed)
Continue nutritional supplements. °
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
## 2023-09-26 NOTE — Progress Notes (Signed)
OT Cancellation Note  Patient Details Name: Jorge West MRN: [id number] DOB: 1946/08/02   Cancelled Treatment:    Reason Eval/Treat Not Completed: Patient at procedure or test/ unavailable (Patient off unit in HD, OT to attempt later today as schedule permits) Alfonse Flavors, OTA Acute Rehabilitation Services  Office [phone]  Dewain Penning 09/26/2023, 8:57 AM

## 2023-09-26 NOTE — Progress Notes (Signed)
Call bell attended, patient mentioned that he is feeling uncomfortable in his catheter insertion site. Upon assessment, huge swelling noted on his scrotum and Penis, and noticed some purulent discharge on his meatus, informed to the doctor and swollen parts has been elevated.

## 2023-09-26 NOTE — Plan of Care (Signed)
  Problem: Clinical Measurements: Goal: Ability to maintain clinical measurements within normal limits will improve Outcome: Progressing Goal: Will remain free from infection Outcome: Progressing Goal: Diagnostic test results will improve Outcome: Progressing Goal: Respiratory complications will improve Outcome: Progressing Goal: Cardiovascular complication will be avoided Outcome: Progressing   Problem: Clinical Measurements: Goal: Will remain free from infection Outcome: Progressing   Problem: Clinical Measurements: Goal: Respiratory complications will improve Outcome: Progressing   Problem: Clinical Measurements: Goal: Diagnostic test results will improve Outcome: Progressing   Problem: Education: Goal: Knowledge of General Education information will improve Description: Including pain rating scale, medication(s)/side effects and non-pharmacologic comfort measures Outcome: Progressing   Problem: Coping: Goal: Level of anxiety will decrease Outcome: Progressing   Problem: Nutrition: Goal: Adequate nutrition will be maintained Outcome: Progressing   Problem: Safety: Goal: Ability to remain free from injury will improve Outcome: Progressing   Problem: Pain Management: Goal: General experience of comfort will improve Outcome: Progressing

## 2023-09-26 NOTE — Progress Notes (Signed)
Pt came back to rm 10 from HD. Reinitiated tele. VSS. Call bell within reach.   Lawson Radar, RN

## 2023-09-26 NOTE — Progress Notes (Signed)
Received patient in bed to unit.  Alert and oriented.  Informed consent signed and in chart.   TX duration:2.5  Patient tolerated well.  Transported back to the room  Alert, without acute distress.  Hand-off given to patient's nurse.   Access used: right [facility] Access issues: none  Total UF removed: 0 Medication(s) given: none   09/26/23 1125  Vitals  Temp 97.7 F (36.5 C)  Temp Source Oral  BP 118/72  MAP (mmHg) 87  BP Location Left Arm  BP Method Automatic  Patient Position (if appropriate) Lying  Pulse Rate Source Monitor  ECG Heart Rate (!) 122  Resp (!) 26  Oxygen Therapy  SpO2 97 %  O2 Device Nasal Cannula  O2 Flow Rate (L/min) 2 L/min  During Treatment Monitoring  Blood Flow Rate (mL/min) 199 mL/min  Arterial Pressure (mmHg) -100.2 mmHg  Venous Pressure (mmHg) 75.95 mmHg  TMP (mmHg) 1.61 mmHg  Ultrafiltration Rate (mL/min) 281 mL/min  Dialysate Flow Rate (mL/min) 300 ml/min  Dialysate Potassium Concentration 4  Dialysate Calcium Concentration 2.25  Duration of HD Treatment -hour(s) 2.5 hour(s)  Cumulative Fluid Removed (mL) per Treatment  0.05  HD Safety Checks Performed Yes  Intra-Hemodialysis Comments Tx completed;Tolerated well  Dialysis Fluid Bolus Normal Saline  Bolus Amount (mL) 300 mL      Tanai Bouler [facility]

## 2023-09-27 ENCOUNTER — Encounter (HOSPITAL_COMMUNITY): Payer: Medicare PPO

## 2023-09-27 ENCOUNTER — Encounter (HOSPITAL_COMMUNITY): Payer: Self-pay | Admitting: *Deleted

## 2023-09-27 DIAGNOSIS — Z955 Presence of coronary angioplasty implant and graft: Secondary | ICD-10-CM

## 2023-09-27 DIAGNOSIS — Z9861 Coronary angioplasty status: Secondary | ICD-10-CM

## 2023-09-27 DIAGNOSIS — N179 Acute kidney failure, unspecified: Secondary | ICD-10-CM | POA: Diagnosis not present

## 2023-09-27 DIAGNOSIS — N1832 Chronic kidney disease, stage 3b: Secondary | ICD-10-CM | POA: Diagnosis not present

## 2023-09-27 LAB — CBC
HCT: 26.5 % — ABNORMAL LOW (ref 39.0–52.0)
Hemoglobin: 8.9 g/dL — ABNORMAL LOW (ref 13.0–17.0)
MCH: 29.1 pg (ref 26.0–34.0)
MCHC: 33.6 g/dL (ref 30.0–36.0)
MCV: 86.6 fL (ref 80.0–100.0)
Platelets: 203 10*3/uL (ref 150–400)
RBC: 3.06 MIL/uL — ABNORMAL LOW (ref 4.22–5.81)
RDW: 12.8 % (ref 11.5–15.5)
WBC: 7.3 10*3/uL (ref 4.0–10.5)
nRBC: 0 % (ref 0.0–0.2)

## 2023-09-27 LAB — RENAL FUNCTION PANEL
Albumin: 1.7 g/dL — ABNORMAL LOW (ref 3.5–5.0)
Anion gap: 8 (ref 5–15)
BUN: 53 mg/dL — ABNORMAL HIGH (ref 8–23)
CO2: 29 mmol/L (ref 22–32)
Calcium: 7.7 mg/dL — ABNORMAL LOW (ref 8.9–10.3)
Chloride: 97 mmol/L — ABNORMAL LOW (ref 98–111)
Creatinine, Ser: 5.93 mg/dL — ABNORMAL HIGH (ref 0.61–1.24)
GFR, Estimated: 9 mL/min — ABNORMAL LOW (ref >60)
Glucose, Bld: 98 mg/dL (ref 70–99)
Phosphorus: 4.7 mg/dL — ABNORMAL HIGH (ref 2.5–4.6)
Potassium: 3.3 mmol/L — ABNORMAL LOW (ref 3.5–5.1)
Sodium: 134 mmol/L — ABNORMAL LOW (ref 135–145)

## 2023-09-27 LAB — CK: Total CK: 1056 U/L — ABNORMAL HIGH (ref 49–397)

## 2023-09-27 MED ORDER — GUAIFENESIN 100 MG/5ML PO LIQD: 5.0000 mL | ORAL | Status: DC | PRN

## 2023-09-27 MED ORDER — METOPROLOL TARTRATE 5 MG/5ML IV SOLN
5.0000 mg | INTRAVENOUS | Status: DC | PRN
  Administered 2023-09-30 – 2023-10-17 (×13): 5 mg via INTRAVENOUS
  Filled 2023-09-27 (×14): qty 5

## 2023-09-27 MED ORDER — IPRATROPIUM-ALBUTEROL 0.5-2.5 (3) MG/3ML IN SOLN: 3.0000 mL | RESPIRATORY_TRACT | Status: DC | PRN

## 2023-09-27 MED ORDER — POTASSIUM CHLORIDE CRYS ER 20 MEQ PO TBCR
40.0000 meq | EXTENDED_RELEASE_TABLET | Freq: Once | ORAL | Status: AC
  Administered 2023-09-27: 40 meq via ORAL
  Filled 2023-09-27: qty 2

## 2023-09-27 MED ORDER — HYDRALAZINE HCL 20 MG/ML IJ SOLN: 10.0000 mg | INTRAMUSCULAR | Status: DC | PRN

## 2023-09-27 MED ORDER — BACITRACIN-NEOMYCIN-POLYMYXIN OINTMENT TUBE
TOPICAL_OINTMENT | Freq: Two times a day (BID) | CUTANEOUS | Status: AC
  Administered 2023-09-27 – 2023-10-03 (×4): 1 via TOPICAL
  Filled 2023-09-27: qty 14

## 2023-09-27 MED ORDER — SENNOSIDES-DOCUSATE SODIUM 8.6-50 MG PO TABS
1.0000 | ORAL_TABLET | Freq: Every evening | ORAL | Status: DC | PRN
  Administered 2023-10-05: 1 via ORAL
  Filled 2023-09-27: qty 1

## 2023-09-27 MED ORDER — TRAZODONE HCL 50 MG PO TABS
50.0000 mg | ORAL_TABLET | Freq: Every evening | ORAL | Status: DC | PRN
  Administered 2023-09-28 – 2023-10-31 (×16): 50 mg via ORAL
  Filled 2023-09-27 (×16): qty 1

## 2023-09-27 NOTE — Progress Notes (Signed)
Inpatient Rehab Coordinator Note:  I met with patient and his daughter in law at bedside to discuss CIR recommendations and goals/expectations of CIR stay.  We reviewed 3 hrs/day of therapy, physician follow up, and average length of stay 2 weeks (dependent upon progress) with goals of supervision to mod I.  Pt reports interest in CIR program when no longer needing HD (hopefully in the next few days).  We discussed insurance auth process and I will start then when he's medically ready for transition to next LOC.    Estill Dooms, PT, DPT Admissions Coordinator [phone] 09/27/23  2:17 PM
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
## 2023-09-27 NOTE — Progress Notes (Signed)
Cardiac Individual Treatment Plan  Patient Details  Name: Jorge West MRN: [id number] Date of Birth: [birth year] Referring Provider:   Flowsheet Row CARDIAC REHAB PHASE II ORIENTATION from 09/05/2023 in [facility] CARDIAC REHABILITATION  Referring Provider Lucrezia Starch MD       Initial Encounter Date:  Flowsheet Row CARDIAC REHAB PHASE II ORIENTATION from 09/05/2023 in [place] CARDIAC REHABILITATION  Date 09/05/23       Visit Diagnosis: Status post coronary artery stent placement  Postsurgical percutaneous transluminal coronary angioplasty (PTCA) status  Patient's Home Medications on Admission: No current facility-administered medications for this visit. No current outpatient medications on file.  Facility-Administered Medications Ordered in Other Visits:    0.9 %  sodium chloride infusion (Manually program via Guardrails IV Fluids), , Intravenous, Once, Sundil, Subrina, MD, Held at 09/19/23 2042   0.9 %  sodium chloride infusion (Manually program via Guardrails IV Fluids), , Intravenous, Once, Arrien, York Ram, MD, Held at 09/20/23 0830   acetaminophen (TYLENOL) tablet 650 mg, 650 mg, Oral, Q6H PRN, 650 mg at 09/26/23 1929 **OR** acetaminophen (TYLENOL) suppository 650 mg, 650 mg, Rectal, Q6H PRN, [name], Subrina, MD   ALPRAZolam Prudy Feeler) tablet 0.5 mg, 0.5 mg, Oral, TID PRN, Arrien, York Ram, MD, 0.5 mg at 09/26/23 1450   amiodarone (PACERONE) tablet 200 mg, 200 mg, Oral, Daily, Perlie Gold, PA-C, 200 mg at 09/26/23 1232   apixaban (ELIQUIS) tablet 5 mg, 5 mg, Oral, BID, Arrien, York Ram, MD, 5 mg at 09/26/23 2127   brimonidine (ALPHAGAN) 0.2 % ophthalmic solution 1 drop, 1 drop, Left Eye, Daily, 1 drop at 09/26/23 1233 **AND** timolol (TIMOPTIC) 0.5 % ophthalmic solution 1 drop, 1 drop, Left Eye, Daily, Arrien, York Ram, MD, 1 drop at 09/26/23 1233   Chlorhexidine Gluconate Cloth 2 % PADS 6 each, 6 each, Topical, Daily, Arrien, York Ram, MD, 6 each at 09/26/23 1233   clopidogrel (PLAVIX) tablet 75 mg, 75 mg, Oral, Daily, Arrien, York Ram, MD, 75 mg at 09/26/23 1232   feeding supplement (BOOST / RESOURCE BREEZE) liquid 1 Container, 1 Container, Oral, Q24H, Arrien, York Ram, MD, 1 Container at 09/26/23 1450   feeding supplement (ENSURE ENLIVE / ENSURE PLUS) liquid 237 mL, 237 mL, Oral, Q24H, Arrien, York Ram, MD, 237 mL at 09/26/23 1240   HYDROmorphone (DILAUDID) injection 0.5 mg, 0.5 mg, Intravenous, Q3H PRN, Arrien, York Ram, MD, 0.5 mg at 09/27/23 0204   metoprolol succinate (TOPROL-XL) 24 hr tablet 25 mg, 25 mg, Oral, Daily, Skains, Veverly Fells, MD, 25 mg at 09/26/23 1231   multivitamin (RENA-VIT) tablet 1 tablet, 1 tablet, Oral, QHS, Arrien, York Ram, MD, 1 tablet at 09/26/23 2127   nitroGLYCERIN (NITROSTAT) SL tablet 0.4 mg, 0.4 mg, Sublingual, Q5 min PRN, Sundil, Subrina, MD   ondansetron [facility]) injection 4 mg, 4 mg, Intravenous, Q6H PRN, [name], Subrina, MD, 4 mg at 09/20/23 0346   oxyCODONE (Oxy IR/ROXICODONE) immediate release tablet 5 mg, 5 mg, Oral, Q4H PRN, Arrien, York Ram, MD, 5 mg at 09/26/23 1929   senna-docusate (Senokot-S) tablet 1 tablet, 1 tablet, Oral, QHS PRN, [name], Subrina, MD   sodium chloride flush (NS) 0.9 % injection 3 mL, 3 mL, Intravenous, Q12H, Sundil, Subrina, MD, 3 mL at 09/26/23 2128   sodium chloride flush (NS) 0.9 % injection 3 mL, 3 mL, Intravenous, PRN, [name], Subrina, MD  Past Medical History: Past Medical History:  Diagnosis Date   Atrial fibrillation [facility])    Had an ablation done  Atypical mole 06/14/2000   moderate/marked on mid back Nino Glow)   Atypical mole 11/29/2001   slight/moderate on right shoulder Nino Glow)   Atypical mole 11/29/2001   moderate on right lateral abdomen Nino Glow)   Atypical mole 11/29/2001   moderate on right forearm Nino Glow)   Atypical mole 01/11/2007   moderate on lower mid back   Atypical  mole 07/14/2008   SK and atypical solar lentigo on left jawline (widershave)   Basal cell carcinoma 07/29/1996   back left lower ear - CX3+excision   Basal cell carcinoma 09/22/2004   basosquamous on right tip of nose (MOHs)   Basal cell carcinoma 10/20/2005   superficial on upper center back - CX3+5FU   Basal cell carcinoma 10/20/2011   right upper back - tx p bx   Basal cell carcinoma 02/07/2018   superficial/nodular on left upper arm - CX3+cautery+5FU   Basal cell carcinoma 10/23/2018   infiltrative on left sideburn [facility])   Benign prostate hyperplasia    Cataract    left and removed   Depression    Depression    Diverticulosis    Erectile dysfunction    Hyperlipidemia    Hypertension    Impaired fasting glucose    Nocturia    Personal history of colonic polyps 04/19/2005   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   Left Temporal Scalp (well diff)   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   left post auricular - clear at visit on 12/28/2001   Squamous cell carcinoma of skin 10/08/2003   well differentiated below outer left eye - MOHs   Squamous cell carcinoma of skin 06/02/2008   well differentiated behind left ear   Squamous cell carcinoma of skin 10/20/2011   KA on left elbow   Squamous cell carcinoma of skin 03/02/2015   well differentiated on right outer cheek - CX3+excision   Squamous cell carcinoma of skin 03/01/2017   well differentiated on left forearm - tx p bx   Squamous cell carcinoma of skin 03/07/2018   moderately differentiated on left jawline - CX3+excision    Tobacco Use: Social History   Tobacco Use  Smoking Status Never  Smokeless Tobacco Former  Tobacco Comments   10-15 yeras quit     Labs: Review Flowsheet       Latest Ref Rng & Units 09/24/2012 09/19/2023  Labs for ITP Cardiac and Pulmonary Rehab  TCO2 22 - 32 mmol/L 22  21     Details            Capillary Blood Glucose: No results found for:  "GLUCAP"   Exercise Target Goals: Exercise Program Goal: Individual exercise prescription set using results from initial 6 min walk test and THRR while considering  patient's activity barriers and safety.   Exercise Prescription Goal: Starting with aerobic activity 30 plus minutes a day, 3 days per week for initial exercise prescription. Provide home exercise prescription and guidelines that participant acknowledges understanding prior to discharge.  Activity Barriers & Risk Stratification:  Activity Barriers & Cardiac Risk Stratification - 09/05/23 0936       Activity Barriers & Cardiac Risk Stratification   Activity Barriers Arthritis;Shortness of Breath;Deconditioning;Muscular Weakness;Balance Concerns    Cardiac Risk Stratification Moderate             6 Minute Walk:  6 Minute Walk     Row Name 09/05/23 0935         6 Minute Walk   Phase Initial  Distance 885 feet     Walk Time 6 minutes     # of Rest Breaks 0     MPH 1.67     METS 1.89     RPE 13     Perceived Dyspnea  2     VO2 Peak 6.63     Symptoms Yes (comment)     Comments SOB, legs fatigued     Resting HR 85 bpm     Resting BP 124/66     Resting Oxygen Saturation  96 %     Exercise Oxygen Saturation  during 6 min walk 95 %     Max Ex. HR 117 bpm  got up to 134 with weights     Max Ex. BP 134/72     2 Minute Post BP 124/56              Oxygen Initial Assessment:   Oxygen Re-Evaluation:   Oxygen Discharge (Final Oxygen Re-Evaluation):   Initial Exercise Prescription:  Initial Exercise Prescription - 09/05/23 0900       Date of Initial Exercise RX and Referring Provider   Date 09/05/23    Referring Provider Lucrezia Starch MD      Oxygen   Maintain Oxygen Saturation 88% or higher      NuStep   Level 2    SPM 80    Minutes 15    METs 2      Arm Ergometer   Level 1    Watts 25    RPM 25    Minutes 15    METs 2      Prescription Details   Frequency (times per week) 3     Duration Progress to 30 minutes of continuous aerobic without signs/symptoms of physical distress      Intensity   THRR 40-80% of Max Heartrate 108-132    Ratings of Perceived Exertion 11-13    Perceived Dyspnea 0-4      Progression   Progression Continue to progress workloads to maintain intensity without signs/symptoms of physical distress.      Resistance Training   Training Prescription Yes    Weight 5 lb    Reps 10-15             Perform Capillary Blood Glucose checks as needed.  Exercise Prescription Changes:   Exercise Prescription Changes     Row Name 09/05/23 0900             Response to Exercise   Blood Pressure (Admit) 124/66       Blood Pressure (Exercise) 134/72       Blood Pressure (Exit) 124/56       Heart Rate (Admit) 85 bpm       Heart Rate (Exercise) 117 bpm  134 during weights       Heart Rate (Exit) 113 bpm       Oxygen Saturation (Admit) 96 %       Oxygen Saturation (Exercise) 95 %       Rating of Perceived Exertion (Exercise) 13       Perceived Dyspnea (Exercise) 2       Symptoms SOB, legs fatigued       Comments walk test results                Exercise Comments:   Exercise Comments     Row Name 09/08/23 1132           Exercise Comments First full  day of exercise!  Patient was oriented to gym and equipment including functions, settings, policies, and procedures.  Patient's individual exercise prescription and treatment plan were reviewed.  All starting workloads were established based on the results of the 6 minute walk test done at initial orientation visit.  The plan for exercise progression was also introduced and progression will be customized based on patient's performance and goals.                Exercise Goals and Review:   Exercise Goals     Row Name 09/05/23 0938             Exercise Goals   Increase Physical Activity Yes       Intervention Provide advice, education, support and counseling about  physical activity/exercise needs.;Develop an individualized exercise prescription for aerobic and resistive training based on initial evaluation findings, risk stratification, comorbidities and participant's personal goals.       Expected Outcomes Short Term: Attend rehab on a regular basis to increase amount of physical activity.;Long Term: Add in home exercise to make exercise part of routine and to increase amount of physical activity.;Long Term: Exercising regularly at least 3-5 days a week.       Increase Strength and Stamina Yes       Intervention Provide advice, education, support and counseling about physical activity/exercise needs.;Develop an individualized exercise prescription for aerobic and resistive training based on initial evaluation findings, risk stratification, comorbidities and participant's personal goals.       Expected Outcomes Short Term: Increase workloads from initial exercise prescription for resistance, speed, and METs.;Short Term: Perform resistance training exercises routinely during rehab and add in resistance training at home;Long Term: Improve cardiorespiratory fitness, muscular endurance and strength as measured by increased METs and functional capacity ( )       Able to understand and use rate of perceived exertion (RPE) scale Yes       Intervention Provide education and explanation on how to use RPE scale       Expected Outcomes Short Term: Able to use RPE daily in rehab to express subjective intensity level;Long Term:  Able to use RPE to guide intensity level when exercising independently       Able to understand and use Dyspnea scale Yes       Intervention Provide education and explanation on how to use Dyspnea scale       Expected Outcomes Short Term: Able to use Dyspnea scale daily in rehab to express subjective sense of shortness of breath during exertion;Long Term: Able to use Dyspnea scale to guide intensity level when exercising independently       Knowledge  and understanding of Target Heart Rate Range (THRR) Yes       Intervention Provide education and explanation of THRR including how the numbers were predicted and where they are located for reference       Expected Outcomes Short Term: Able to state/look up THRR;Long Term: Able to use THRR to govern intensity when exercising independently;Short Term: Able to use daily as guideline for intensity in rehab       Able to check pulse independently Yes       Intervention Provide education and demonstration on how to check pulse in carotid and radial arteries.;Review the importance of being able to check your own pulse for safety during independent exercise       Expected Outcomes Short Term: Able to explain why pulse checking is important during independent exercise;Long  Term: Able to check pulse independently and accurately       Understanding of Exercise Prescription Yes       Intervention Provide education, explanation, and written materials on patient's individual exercise prescription       Expected Outcomes Short Term: Able to explain program exercise prescription;Long Term: Able to explain home exercise prescription to exercise independently                Exercise Goals Re-Evaluation :  Exercise Goals Re-Evaluation     Row Name 09/08/23 1133             Exercise Goal Re-Evaluation   Exercise Goals Review Understanding of Exercise Prescription;Knowledge and understanding of Target Heart Rate Range (THRR);Able to understand and use rate of perceived exertion (RPE) scale;Able to understand and use Dyspnea scale       Comments Reviewed RPE and dyspnea scale, THR and program prescription with pt today.  Pt voiced understanding and was given a copy of goals to take home.       Expected Outcomes Short: Use RPE daily to regulate intensity.  Long: Follow program prescription in THR.                 Discharge Exercise Prescription (Final Exercise Prescription Changes):  Exercise  Prescription Changes - 09/05/23 0900       Response to Exercise   Blood Pressure (Admit) 124/66    Blood Pressure (Exercise) 134/72    Blood Pressure (Exit) 124/56    Heart Rate (Admit) 85 bpm    Heart Rate (Exercise) 117 bpm   134 during weights   Heart Rate (Exit) 113 bpm    Oxygen Saturation (Admit) 96 %    Oxygen Saturation (Exercise) 95 %    Rating of Perceived Exertion (Exercise) 13    Perceived Dyspnea (Exercise) 2    Symptoms SOB, legs fatigued    Comments walk test results             Nutrition:  Target Goals: Understanding of nutrition guidelines, daily intake of sodium 1500mg , cholesterol 200mg , calories 30% from fat and 7% or less from saturated fats, daily to have 5 or more servings of fruits and vegetables.  Biometrics:  Pre Biometrics - 09/05/23 0938       Pre Biometrics   Height 5\' 7"  (1.702 m)    Weight 186 lb 1.6 oz (84.4 kg)    Waist Circumference 37 inches    Hip Circumference 39.5 inches    Waist to Hip Ratio 0.94 %    BMI (Calculated) 29.14    Grip Strength 26.4 kg    Single Leg Stand 17.6 seconds              Nutrition Therapy Plan and Nutrition Goals:   Nutrition Assessments:  MEDIFICTS Score Key: >=70 Need to make dietary changes  40-70 Heart Healthy Diet <= 40 Therapeutic Level Cholesterol Diet  Flowsheet Row CARDIAC REHAB PHASE II ORIENTATION from 09/05/2023 in [facility] CARDIAC [facility]ABILITATION  Picture Your Plate Total Score on Admission 63      Picture Your Plate Scores: <16 Unhealthy dietary pattern with much room for improvement. 41-50 Dietary pattern unlikely to meet recommendations for good health and room for improvement. 51-60 More healthful dietary pattern, with some room for improvement.  >60 Healthy dietary pattern, although there may be some specific behaviors that could be improved.    Nutrition Goals Re-Evaluation:   Nutrition Goals Discharge (Final Nutrition Goals  Re-Evaluation):  Psychosocial: Target Goals: Acknowledge presence or absence of significant depression and/or stress, maximize coping skills, provide positive support system. Participant is able to verbalize types and ability to use techniques and skills needed for reducing stress and depression.  Initial Review & Psychosocial Screening:  Initial Psych Review & Screening - 08/30/23 1116       Initial Review   Current issues with History of Depression;Current Psychotropic Meds      Family Dynamics   Good Support System? Yes      Barriers   Psychosocial barriers to participate in program The patient should benefit from training in stress management and relaxation.;There are no identifiable barriers or psychosocial needs.      Screening Interventions   Interventions Encouraged to exercise;To provide support and resources with identified psychosocial needs;Provide feedback about the scores to participant    Expected Outcomes Short Term goal: Utilizing psychosocial counselor, staff and physician to assist with identification of specific Stressors or current issues interfering with healing process. Setting desired goal for each stressor or current issue identified.;Long Term Goal: Stressors or current issues are controlled or eliminated.;Short Term goal: Identification and review with participant of any Quality of Life or Depression concerns found by scoring the questionnaire.;Long Term goal: The participant improves quality of Life and PHQ9 Scores as seen by post scores and/or verbalization of changes             Quality of Life Scores:  Quality of Life - 08/30/23 1114       Quality of Life   Select Quality of Life      Quality of Life Scores   Health/Function Pre 18.6 %    Socioeconomic Pre 24.75 %    Psych/Spiritual Pre 28.29 %    Family Pre 30 %    GLOBAL Pre 23.57 %            Scores of 19 and below usually indicate a poorer quality of life in these areas.  A  difference of  2-3 points is a clinically meaningful difference.  A difference of 2-3 points in the total score of the Quality of Life Index has been associated with significant improvement in overall quality of life, self-image, physical symptoms, and general health in studies assessing change in quality of life.  PHQ-9: Review Flowsheet       09/05/2023  Depression screen PHQ 2/9  Decreased Interest 0  Down, Depressed, Hopeless 0  PHQ - 2 Score 0  Altered sleeping 0  Tired, decreased energy 3  Change in appetite 1  Feeling bad or failure about yourself  0  Trouble concentrating 0  Moving slowly or fidgety/restless 1  Suicidal thoughts 0  PHQ-9 Score 5  Difficult doing work/chores Somewhat difficult    Details           Interpretation of Total Score  Total Score Depression Severity:  1-4 = Minimal depression, 5-9 = Mild depression, 10-14 = Moderate depression, 15-19 = Moderately severe depression, 20-27 = Severe depression   Psychosocial Evaluation and Intervention:  Psychosocial Evaluation - 08/30/23 1116       Psychosocial Evaluation & Interventions   Interventions Stress management education;Relaxation education;Encouraged to exercise with the program and follow exercise prescription    Comments Patient was referred to CR with Stent Placement and PTCA from [facility]. He does have a history of depression and is currently taking Buspar and Lexapro. He says he does not have any depression or anxiety presently. He says he sleeps well.  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 His main stressor currently is not able to take care of his farm since his stent but his son is helping out until he is able. He lives with his wife of many years who is his main support along with his 2 sons and a grandson. His main issue before and since his stent is SOB. He complained about this at his follow up cardiology visit and he has been  having some hypotensive episodes. He has chronic A-fib. They did make some medication adjustments for the hypotension. His main goal for the program is to improve his SOB and energy and to be able to get back to doing his farming and living a normal life again. He has no barriers identiifed to complete the program.    Expected Outcomes Short Term: Start the program and attend consistently. Long Term: meet his perosnal goals.    Continue Psychosocial Services  Follow up required by staff             Psychosocial Re-Evaluation:   Psychosocial Discharge (Final Psychosocial Re-Evaluation):   Vocational Rehabilitation: Provide vocational rehab assistance to qualifying candidates.   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             Education: Education Goals: Education classes will be provided on a weekly basis, covering required topics. Participant will state understanding/return demonstration of topics presented.  Learning Barriers/Preferences:  Learning Barriers/Preferences - 08/30/23 1108       Learning Barriers/Preferences   Learning Barriers None    Learning Preferences Audio;Written Material;Skilled Demonstration             Education Topics: Hypertension, Hypertension Reduction -Define heart disease and high blood pressure. Discus how high blood pressure affects the body and ways to reduce high blood pressure.   Exercise and Your Heart -Discuss why it is important to exercise, the FITT principles of exercise, normal and abnormal responses to exercise, and how to exercise safely.   Angina -Discuss definition of angina, causes of angina, treatment of angina, and how to decrease risk of having angina.   Cardiac  Medications -Review what the following cardiac medications are used for, how they affect the body, and side effects that may occur when taking the medications.  Medications include Aspirin, Beta blockers, calcium channel blockers, ACE Inhibitors, angiotensin receptor blockers, diuretics, digoxin, and antihyperlipidemics.   Congestive Heart Failure -Discuss the definition of CHF, how to live with CHF, the signs and symptoms of CHF, and how keep track of weight and sodium intake.   Heart Disease and Intimacy -Discus the effect sexual activity has on the heart, how changes occur during intimacy as we age, and safety during sexual activity.   Smoking Cessation / COPD -Discuss different methods to quit smoking, the health benefits of quitting smoking, and the definition of COPD.   Nutrition I: Fats -Discuss the types of cholesterol, what cholesterol does to the heart, and how cholesterol levels can be controlled.   Nutrition II: Labels -Discuss the different components of food labels and how to read food label   Heart Parts/Heart Disease and PAD -Discuss the anatomy of the heart, the pathway of  blood circulation through the heart, and these are affected by heart disease.   Stress I: Signs and Symptoms -Discuss the causes of stress, how stress may lead to anxiety and depression, and ways to limit stress. Flowsheet Row CARDIAC REHAB PHASE II EXERCISE from 09/15/2023 in [place] CARDIAC REHABILITATION  Date 09/13/23  Educator jh  Instruction Review Code 1- Verbalizes Understanding       Stress II: Relaxation -Discuss different types of relaxation techniques to limit stress.   Warning Signs of Stroke / TIA -Discuss definition of a stroke, what the signs and symptoms are of a stroke, and how to identify when someone is having stroke.   Knowledge Questionnaire Score:  Knowledge Questionnaire Score - 08/30/23 1111       Knowledge Questionnaire Score   Pre Score 24/26              Core Components/Risk Factors/Patient Goals at Admission:  Personal Goals and Risk Factors at Admission - 09/05/23 0939       Core Components/Risk Factors/Patient Goals on Admission    Weight Management Yes;Weight Loss;Weight Maintenance    Intervention Weight Management: Develop a combined nutrition and exercise program designed to reach desired caloric intake, while maintaining appropriate intake of nutrient and fiber, sodium and fats, and appropriate energy expenditure required for the weight goal.;Weight Management: Provide education and appropriate resources to help participant work on and attain dietary goals.;Weight Management/Obesity: Establish reasonable short term and long term weight goals.    Admit Weight 186 lb 1.6 oz (84.4 kg)    Goal Weight: Short Term 183 lb (83 kg)    Goal Weight: Long Term 183 lb (83 kg)    Expected Outcomes Short Term: Continue to assess and modify interventions until short term weight is achieved;Long Term: Adherence to nutrition and physical activity/exercise program aimed toward attainment of established weight goal;Weight Maintenance: Understanding of the daily nutrition guidelines, which includes 25-35% calories from fat, 7% or less cal from saturated fats, less than 200mg  cholesterol, less than 1.5gm of sodium, & 5 or more servings of fruits and

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
## 2023-09-28 DIAGNOSIS — N1832 Chronic kidney disease, stage 3b: Secondary | ICD-10-CM | POA: Diagnosis not present

## 2023-09-28 DIAGNOSIS — N179 Acute kidney failure, unspecified: Secondary | ICD-10-CM | POA: Diagnosis not present

## 2023-09-28 LAB — BASIC METABOLIC PANEL
Anion gap: 9 (ref 5–15)
BUN: 61 mg/dL — ABNORMAL HIGH (ref 8–23)
CO2: 29 mmol/L (ref 22–32)
Calcium: 7.9 mg/dL — ABNORMAL LOW (ref 8.9–10.3)
Chloride: 97 mmol/L — ABNORMAL LOW (ref 98–111)
Creatinine, Ser: 6.17 mg/dL — ABNORMAL HIGH (ref 0.61–1.24)
GFR, Estimated: 9 mL/min — ABNORMAL LOW (ref >60)
Glucose, Bld: 99 mg/dL (ref 70–99)
Potassium: 3.6 mmol/L (ref 3.5–5.1)
Sodium: 135 mmol/L (ref 135–145)

## 2023-09-28 LAB — CBC
HCT: 27.2 % — ABNORMAL LOW (ref 39.0–52.0)
Hemoglobin: 9.2 g/dL — ABNORMAL LOW (ref 13.0–17.0)
MCH: 29.3 pg (ref 26.0–34.0)
MCHC: 33.8 g/dL (ref 30.0–36.0)
MCV: 86.6 fL (ref 80.0–100.0)
Platelets: 223 10*3/uL (ref 150–400)
RBC: 3.14 MIL/uL — ABNORMAL LOW (ref 4.22–5.81)
RDW: 13.1 % (ref 11.5–15.5)
WBC: 7.3 10*3/uL (ref 4.0–10.5)
nRBC: 0 % (ref 0.0–0.2)

## 2023-09-28 LAB — MAGNESIUM: Magnesium: 1.9 mg/dL (ref 1.7–2.4)

## 2023-09-28 LAB — CK: Total CK: 635 U/L — ABNORMAL HIGH (ref 49–397)

## 2023-09-28 MED ORDER — MIDODRINE HCL 5 MG PO TABS
10.0000 mg | ORAL_TABLET | Freq: Three times a day (TID) | ORAL | Status: DC
  Administered 2023-09-28 – 2023-10-08 (×26): 10 mg via ORAL
  Filled 2023-09-28 (×27): qty 2

## 2023-09-28 MED ORDER — POTASSIUM CHLORIDE 20 MEQ PO PACK
20.0000 meq | PACK | Freq: Once | ORAL | Status: AC
  Administered 2023-09-28: 20 meq via ORAL
  Filled 2023-09-28: qty 1
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
## 2023-09-28 NOTE — Progress Notes (Signed)
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 Ella Jubilee MD   Jorge West is an 77 y.o. male with a PMHx significant for HF, atrial fibrillation, coronary artery disease, paroxysmal atrial fibrillation, HTN, CKD3B, and BPH who presented on 09/19/23 with trauma. He was changing a tire on is car and it fell on his leg. Apparently, he was trapped for about 2 hrs until his family found him and called EMS. Ultimately he was admitted for L femoral condyle avulsion fracture, and AoCKD3B.   Subjective: Patient resting comfortably in bed and is drowsy this morning after pain medicine and xanax provided. Wife is at bedside and reports patient has had increased anxiety today. He continues with good UOP overnight. Yellow urine is present in foley bag this morning. Wife states patient has not eaten or drank much since yesterday afternoon. Patient continues to have scrotal swelling, but no scrotal pain with sling in place this morning. He denies chest pain, shortness of breath, abdominal pain, nausea, or vomiting. No nasal cannula today. Patient is able to answer questions appropriately. Patient and wife informed that we will hold off on dialysis today and monitor how his kidneys are functioning tomorrow. May need HD tomorrow with UF due to scrotal edema.  Trend in Creatinine: Creatinine, Ser  Date/Time Value Ref Range Status  09/28/2023 05:20 AM 6.17 (H) 0.61 - 1.24 mg/dL Final  30/86/5784 69:62 AM 5.93 (H) 0.61 - 1.24 mg/dL Final  95/28/4132 44:01 AM 7.23 (H) 0.61 - 1.24 mg/dL Final  02/72/5366 44:03 AM 6.87 (H) 0.61 - 1.24 mg/dL Final  47/42/5956 38:75 AM 6.04 (H) 0.61 - 1.24 mg/dL Final  64/33/2951 88:41 AM 7.12 (H) 0.61 - 1.24 mg/dL Final  66/04/3015 01:09 AM 6.03 (H) 0.61 - 1.24 mg/dL Final  32/35/5732 20:25 AM 4.71 (H) 0.61 - 1.24 mg/dL Final  42/70/6237 62:83 AM 3.21 (H) 0.61 - 1.24 mg/dL Final  15/17/6160 73:71 PM 2.30 (H) 0.61 - 1.24 mg/dL Final  05/16/9484 46:27 PM 2.36 (H)  0.61 - 1.24 mg/dL Final  03/50/0938 18:29 AM 1.77 (H) 0.61 - 1.24 mg/dL Final  93/71/6967 89:38 AM 2.02 (H) 0.61 - 1.24 mg/dL Final  09/06/5101 58:52 PM 2.07 (H) 0.61 - 1.24 mg/dL Final  77/82/4235 36:14 AM 1.45 (H) 0.61 - 1.24 mg/dL Final  43/15/4008 67:61 AM 1.22 0.61 - 1.24 mg/dL Final  95/07/3266 12:45 PM 1.20 0.50 - 1.35 mg/dL Final  80/99/8338 25:05 PM 1.01 0.50 - 1.35 mg/dL Final    PMH:   Past Medical History:  Diagnosis Date   Atrial fibrillation (HCC)    Had an ablation done   Atypical mole 06/14/2000   moderate/marked on mid back Nino Glow)   Atypical mole 11/29/2001   slight/moderate on right shoulder Nino Glow)   Atypical mole 11/29/2001   moderate on right lateral abdomen Nino Glow)   Atypical mole 11/29/2001   moderate on right forearm Nino Glow)   Atypical mole 01/11/2007   moderate on lower mid back   Atypical mole 07/14/2008   SK and atypical solar lentigo on left jawline (widershave)   Basal cell carcinoma 07/29/1996   back left lower ear - CX3+excision   Basal cell carcinoma 09/22/2004   basosquamous on right tip of nose (MOHs)   Basal cell carcinoma 10/20/2005   superficial on upper center back - CX3+5FU   Basal cell carcinoma 10/20/2011   right upper back - tx p bx   Basal cell carcinoma 02/07/2018   superficial/nodular on left upper arm - CX3+cautery+5FU  Basal cell carcinoma 10/23/2018   infiltrative on left sideburn [facility])   Benign prostate hyperplasia    Cataract    left and removed   Depression    Depression    Diverticulosis    Erectile dysfunction    Hyperlipidemia    Hypertension    Impaired fasting glucose    Nocturia    Personal history of colonic polyps 04/19/2005   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   Left Temporal Scalp (well diff)   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   left post auricular - clear at visit on 12/28/2001   Squamous cell carcinoma of skin 10/08/2003   well differentiated below  outer left eye - MOHs   Squamous cell carcinoma of skin 06/02/2008   well differentiated behind left ear   Squamous cell carcinoma of skin 10/20/2011   KA on left elbow   Squamous cell carcinoma of skin 03/02/2015   well differentiated on right outer cheek - CX3+excision   Squamous cell carcinoma of skin 03/01/2017   well differentiated on left forearm - tx p bx   Squamous cell carcinoma of skin 03/07/2018   moderately differentiated on left jawline - CX3+excision    PSH:   Past Surgical History:  Procedure Laterality Date   CATARACT EXTRACTION     COLONOSCOPY     IR FLUORO GUIDE CV LINE RIGHT  09/22/2023   IR US GUIDE VASC ACCESS RIGHT  09/22/2023   OPEN REDUCTION INTERNAL FIXATION (ORIF) METACARPAL  09/24/2012   Procedure: OPEN REDUCTION INTERNAL FIXATION (ORIF) METACARPAL;  Surgeon: Sharma Covert, MD;  Location: [facility] OR;  Service: Orthopedics;  Laterality: Left;  and proximal phalanges.   surgery for skin cancer      Allergies:  Allergies  Allergen Reactions   Terazosin Hcl     Dizziness and hypotension    Medications:   Prior to Admission medications   Medication Sig Start Date End Date Taking? Authorizing Provider  acetaminophen (TYLENOL) 325 MG tablet Take 2 tablets (650 mg total) by mouth every 6 (six) hours as needed for mild pain (or Fever >/= 101). 06/03/19  Yes Emokpae, Courage, MD  ALPRAZolam (XANAX) 0.5 MG tablet Take 0.5 mg by mouth 2 (two) times daily as needed for anxiety.   Yes [provider]  amLODipine (NORVASC) 10 MG tablet Take 10 mg by mouth daily. 08/19/23  Yes [provider]  apixaban (ELIQUIS) 5 MG TABS tablet Take 1 tablet (5 mg total) by mouth 2 (two) times daily. Blood thinner for stroke prevention 06/03/19  Yes Emokpae, Courage, MD  brimonidine-timolol (COMBIGAN) 0.2-0.5 % ophthalmic solution Place 1 drop into the left eye daily. 02/29/16  Yes [provider]  buPROPion (WELLBUTRIN XL) 300 MG 24 hr tablet Take 300 mg by mouth  daily.   Yes [provider]  Cholecalciferol (VITAMIN D3) 3000 UNITS TABS Take 1 tablet by mouth daily.    Yes [provider]  clopidogrel (PLAVIX) 75 MG tablet Take 75 mg by mouth daily.   Yes [provider]  cyanocobalamin (VITAMIN B12) 100 MCG tablet Take 100 mcg by mouth daily.   Yes [provider]  escitalopram (LEXAPRO) 10 MG tablet Take 10 mg by mouth daily.   Yes [provider]  metoprolol succinate (TOPROL-XL) 25 MG 24 hr tablet Take 25 mg by mouth daily. 08/22/23  Yes [provider]  nitroGLYCERIN (NITROSTAT) 0.4 MG SL tablet Place 0.4 mg under the tongue every 5 (five) minutes  as needed for chest pain.   Yes [provider]  Omega-3 Fatty Acids (FISH OIL PO) Take 1 capsule by mouth daily.    Yes [provider]  pantoprazole (PROTONIX) 40 MG tablet Take 40 mg by mouth daily.   Yes [provider]  rosuvastatin (CRESTOR) 40 MG tablet Take 40 mg by mouth daily.   Yes [provider]  sacubitril-valsartan (ENTRESTO) 24-26 MG Take 1 tablet by mouth daily.   Yes [provider]  vitamin C (ASCORBIC ACID) 500 MG tablet Take 1 tablet (500 mg total) by mouth daily. 09/25/12  Yes Bradly Bienenstock, MD    Inpatient medications:  sodium chloride   Intravenous Once   sodium chloride   Intravenous Once   amiodarone  200 mg Oral Daily   apixaban  5 mg Oral BID   brimonidine  1 drop Left Eye Daily   And   timolol  1 drop Left Eye Daily   Chlorhexidine Gluconate Cloth  6 each Topical Daily   clopidogrel  75 mg Oral Daily   feeding supplement  1 Container Oral Q24H   feeding supplement  237 mL Oral Q24H   metoprolol succinate  25 mg Oral Daily   midodrine  10 mg Oral TID WC   multivitamin  1 tablet Oral QHS   neomycin-bacitracin-polymyxin   Topical BID   sodium chloride flush  3 mL Intravenous Q12H    Discontinued Meds:   Medications Discontinued During This Encounter  Medication Reason    methocarbamol (ROBAXIN) 500 MG tablet No longer needed (for PRN medications)   metoprolol tartrate (LOPRESSOR) 25 MG tablet Discontinued by provider   pravastatin (PRAVACHOL) 20 MG tablet Discontinued by provider   amLODipine (NORVASC) 5 MG tablet Dose change   clopidogrel (PLAVIX) tablet 75 mg    metoprolol tartrate (LOPRESSOR) tablet 12.5 mg    dextrose 5 % in lactated ringers infusion    rosuvastatin (CRESTOR) tablet 40 mg    dextrose 5 % in lactated ringers infusion    midodrine (PROAMATINE) tablet 5 mg    HYDROmorphone (DILAUDID) injection 0.5 mg    heparin ADULT infusion 100 units/mL (25000 units/226mL) Change in therapy   0.9 %  sodium chloride infusion    HYDROmorphone (DILAUDID) injection 0.5-1 mg    apixaban (ELIQUIS) tablet 5 mg    clopidogrel (PLAVIX) tablet 75 mg    0.9 %  sodium chloride infusion    brimonidine-timolol (COMBIGAN) 0.2-0.5 % ophthalmic solution 1 drop P&T Policy: Therapeutic Substitute   amiodarone (NEXTERONE PREMIX) 360-4.14 MG/200ML-% (1.8 mg/mL) IV infusion    amiodarone (NEXTERONE PREMIX) 360-4.14 MG/200ML-% (1.8 mg/mL) IV infusion    heparin injection 1,000 Units Patient Transfer   anticoagulant sodium citrate solution 5 mL Patient Transfer   alteplase (CATHFLO ACTIVASE) injection 2 mg Patient Transfer   sodium bicarbonate 150 mEq in sterile water 1,150 mL infusion    senna-docusate (Senokot-S) tablet 1 tablet     Social History:  reports that he has never smoked. He has quit using smokeless tobacco. He reports that he does not currently use alcohol. He reports that he does not use drugs.  Family History:   Family History  Problem Relation Age of Onset   Cancer Other        family history    Colon cancer Neg Hx    Colon polyps Neg Hx    Rectal cancer Neg Hx    Stomach cancer Neg Hx     Pertinent items noted  in HPI and remainder of comprehensive ROS otherwise negative. Weight change: 1 kg  Intake/Output Summary (Last 24 hours) at 09/28/2023  0912 Last data filed at 09/28/2023 0754 Gross per 24 hour  Intake --  Output 800 ml  Net -800 ml   BP (!) 84/65 (BP Location: Right Arm)   Pulse (!) 124   Temp 97.7 F (36.5 C) (Oral)   Resp 20   Ht 5\' 10"  (1.778 m)   Wt 101.3 kg   SpO2 100%   BMI 32.04 kg/m  Vitals:   09/27/23 2305 09/28/23 0454 09/28/23 0605 09/28/23 0748  BP: 103/74   (!) 84/65  Pulse:      Resp:    20  Temp: 98 F (36.7 C) 98.1 F (36.7 C)  97.7 F (36.5 C)  TempSrc: Oral Oral  Oral  SpO2:    100%  Weight:   101.3 kg   Height:         Physical Exam: General: Laying in bed, NAD. Drowsy but alert to vocal stimuli. Answers questions appropriately. Head: No apparent abnormalities. CV: Irregularly irregular. No rub. Lungs: CTAB to anterior lung fields.  Abd: Soft, non-tender to palpation of four quadrants and epigastrium. No guarding or rebound. Extremities: No peripheral edema noted to RLE. LLE with brace and wound dressing applied. Moderate non-pitting edema of LLE. Skin: No petichiae or purpura noted.  GU: Moderate scrotal edema. Psych: Mood and affect appropriate.  Labs: Basic Metabolic Panel: Recent Labs  Lab 09/22/23 0527 09/23/23 0500 09/24/23 0808 09/25/23 0328 09/26/23 0536 09/27/23 0422 09/28/23 0520  NA 136 134* 137 134* 134* 134* 135  K 3.4* 3.2* 3.4* 3.1* 3.2* 3.3* 3.6  CL 100 96* 93* 93* 93* 97* 97*  CO2 25 25 28 31 30 29 29   GLUCOSE 98 93 98 99 95 98 99  BUN 44* 53* 44* 55* 65* 53* 61*  CREATININE 6.03* 7.12* 6.04* 6.87* 7.23* 5.93* 6.17*  ALBUMIN 2.2* 2.0* 2.0* 1.7* 1.8* 1.7*  --   CALCIUM 7.4* 7.5* 7.8* 7.6* 7.9* 7.7* 7.9*  PHOS  --  6.4* 5.2*  --  5.4* 4.7*  --    Liver Function Tests: Recent Labs  Lab 09/22/23 0527 09/23/23 0500 09/25/23 0328 09/26/23 0536 09/27/23 0422  AST 198*  --  104*  --   --   ALT 45*  --  37  --   --   ALKPHOS 36*  --  35*  --   --   BILITOT 0.6  --  1.3*  --   --   PROT 4.7*  --  4.3*  --   --   ALBUMIN 2.2*   < > 1.7* 1.8* 1.7*    < > = values in this interval not displayed.   No results for input(s): "LIPASE", "AMYLASE" in the last 168 hours. No results for input(s): "AMMONIA" in the last 168 hours. CBC: Recent Labs  Lab 09/23/23 1350 09/26/23 0536 09/27/23 0422 09/28/23 0520  WBC 6.3 7.1 7.3 7.3  HGB 8.5* 9.7* 8.9* 9.2*  HCT 24.5* 27.9* 26.5* 27.2*  MCV 85.4 84.8 86.6 86.6  PLT 140* 201 203 223   PT/INR: @LABRCNTIP (inr:5) Cardiac Enzymes: ) Recent Labs  Lab 09/24/23 0808 09/25/23 0328 09/26/23 0536 09/27/23 0422 09/28/23 0520  CKTOTAL 4,884* 2,422* 1,692* 1,056* 635*   CBG: No results for input(s): "GLUCAP" in the last 168 hours.  Iron Studies: No results for input(s): "IRON", "TIBC", "TRANSFERRIN", "FERRITIN" in the last 168 hours.  Xrays/Other Studies:  No results found.   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 Patient's renal labs with Crt of 6.17 this morning compared to 5.93 day prior. Similar kidney response to first HD treatment. BUN 61 today compared to 53 day prior. No uremic symptoms.  Patient is net - last 24hr which shows signs of improvement, especially since patient has been drinking very little fluid and has not have IV fluids past few days. Day prior with net -547 mL. Daily assessment of UOP, BUN, Cr, K for HD needs; Plan for HD as needed as patient continues to recover. Potential HD treatment tomorrow if needed with UF. Hopefully that would be the last HD treatment. No HD orders until MD assess each day. Traumatic rhabdomyolysis, CK levels proving slowly Previously on hydration with bicarbonate IVF's. Bicarb trending up to 31 11/04 and Bicarb IVF stopped 11/04. Patient was switched to NaCl 0.9% IV for hydration therapy 11/04. No need for NaCl IV as long as patient drinking fluids. CK has been down trending nicely: today  635 --> 1,056 day prior --> 1,692 --> 2,422 --> 4,884 --> 7,902 --> 10,049. Hypokalemia HD treatments with 4K bath. Provide potassium chloride as needed 40 mEq with diet. Patient encouraged to push PO intake, although low appetite. Acute hypoxemic respiratory failure, with 02 saturation 97% on 2 L/min per [place] initially. No longer requiring [place]. Permanent atrial fibrillation with RVR, followed by cardiology, on amiodarone.  Apixaban restarted 5 mg BID. CAD with PCI placed about 2 months ago, per cardiology. Plavix 75 mg daily. 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  Closed left femoral condyle avulsion fracture, tibial plateau fracture, and brace currently, orthopedics following. Hypertension but blood pressures are stable, soft. Hold Entresto for AKI MAP >65 for renal perfusion  Anemia: Hbg of 9.2 today, 9.7 yesterday. 10.0 --> 12.3 --> 12.2. Continue to monitor. PRBC transfusion for Hbg <7.0. Medication Issues; Preferred narcotic agents for pain control are hydromorphone, fentanyl, and methadone. Morphine should not be used.  Baclofen should be avoided Avoid oral sodium phosphate and magnesium citrate based laxatives / bowel preps   Theador Hawthorne Pflum PA student 09/28/2023, 9:12 AM   I have seen and examined this patient and agree with plan and assessment in the above note with renal recommendations/intervention highlighted.  Will continue to follow UOP and Scr.  May require another session of HD tomorrow with UF due to scrotal edema (last HD was for clearance only since UOP had been picking up).   Jomarie Longs A Konstantina Nachreiner,MD 09/28/2023 2:00 PM

## 2023-09-28 NOTE — Progress Notes (Signed)
PROGRESS NOTE    Jorge West  ZOX:[id number] DOB: 03/28/1946 DOA: 09/19/2023 PCP: Elizabeth Palau, FNP     Brief Narrative:  77 year old with history of CHF, A-fib, CAD, HTN, CKD, BPH presenting with left lower extremity trauma Anteroapical and then falling on the floor.  Apparently was trapped for about 2 hours until family found him.  Upon admission CT head, cervical spine negative, CT chest abdomen pelvis showed acute T12 compression fracture, CT of lower extremity showed asymmetric enlargement of muscle on the left.  Eventually found to be in severe rhabdomyolysis causing acute kidney injury.  Patient started on dialysis.  Over the producing urine creatinine slightly trending up including BUN.     Assessment & Plan:  Principal Problem:   Acute kidney injury superimposed on stage 3b chronic kidney disease (HCC) Active Problems:   Atrial fibrillation with RVR (HCC)   Essential hypertension   Closed left femoral condyle avulsion fracture (HCC)   Coronary artery disease   Anemia of chronic disease   Malnutrition of moderate degree   Persistent atrial fibrillation (HCC)     Acute kidney injury superimposed on stage 3b chronic kidney disease (HCC) To myolysis, improving Unfortunately renal failure/ATN secondary to rhabdomyolysis.  HD sessions per nephrology team.  Oligouric with elevated creatinine and BUN.  Midodrine for soft blood pressures Right IJ HD nontunneled catheter   Atrial fibrillation with RVR (HCC) Continue amiodarone, Toprol-XL.  Eliquis.  Scrotal edema - Elevate   Hypokalemia - Managed with HD   Acute on chronic diastolic CHF (congestive heart failure) (HCC) Echocardiogram with preserved LV systolic function with EF 60- to 65%, mild LVH, RV with normal systolic function, no significant valvular disease.  On metoprolol.  Avoiding ARB/ARNI due to AKI   Essential hypertension On metoprolol.  IV as needed.   Closed left femoral condyle avulsion fracture  (HCC) T12 compression fracture.    Coronary artery disease status post PCI September 2024 PCI at [facility]  Currently on Plavix, Eliquis   Anemia of chronic disease Follow up hgb is 9.7     Malnutrition of moderate degree Continue nutritional supplements.    DVT prophylaxis: Eliquis Code Status: Full code Family Communication: Spouse at bedside Status is: Inpatient Remains inpatient appropriate because: On going Renal management.        Subjective: Drowsy this morning after getting Ativan and pain medicine overnight.  Later in the day he was waking up but quite anxious.  Wife is present at bedside as well.     Examination:   General exam: Appears calm and comfortable, drowsy Respiratory system: Clear to auscultation. Respiratory effort normal. Cardiovascular system: S1 & S2 heard, RRR. No JVD, murmurs, rubs, gallops or clicks. No pedal edema. Gastrointestinal system: Abdomen is nondistended, soft and nontender. No organomegaly or masses felt. Normal bowel sounds heard. Central nervous system: Alert and oriented. No focal neurological deficits. Extremities: Symmetric 5 x 5 power. Skin: No rashes, lesions or ulcers Psychiatry: Judgement and insight appear normal. Mood & affect appropriate. Right IJ  HD catheter Foley catheter in place               Diet Orders (From admission, onward)     Start     Ordered   09/24/23 0927  Diet regular Fluid consistency: Thin  Diet effective now       Question:  Fluid consistency:  Answer:  Thin   09/24/23 0927            Objective: Vitals:  09/28/23 0454 09/28/23 0605 09/28/23 0748 09/28/23 1108  BP:   (!) 84/65 122/83  Pulse:    (!) 102  Resp:   20   Temp: 98.1 F (36.7 C)  97.7 F (36.5 C)   TempSrc: Oral  Oral   SpO2:   100%   Weight:  101.3 kg    Height:        Intake/Output Summary (Last 24 hours) at 09/28/2023 1158 Last data filed at 09/28/2023 0754 Gross per 24 hour  Intake --  Output 800 ml  Net  -800 ml   Filed Weights   09/26/23 1135 09/27/23 0431 09/28/23 0605  Weight: 100 kg 100.1 kg 101.3 kg    Scheduled Meds:  sodium chloride   Intravenous Once   sodium chloride   Intravenous Once   amiodarone  200 mg Oral Daily   apixaban  5 mg Oral BID   brimonidine  1 drop Left Eye Daily   And   timolol  1 drop Left Eye Daily   Chlorhexidine Gluconate Cloth  6 each Topical Daily   clopidogrel  75 mg Oral Daily   feeding supplement  1 Container Oral Q24H   feeding supplement  237 mL Oral Q24H   metoprolol succinate  25 mg Oral Daily   midodrine  10 mg Oral TID WC   multivitamin  1 tablet Oral QHS   neomycin-bacitracin-polymyxin   Topical BID   sodium chloride flush  3 mL Intravenous Q12H   Continuous Infusions:  Nutritional status Signs/Symptoms: mild fat depletion, mild muscle depletion Interventions: Ensure Enlive (each supplement provides 350kcal and 20 grams of protein), MVI, Magic cup, Education Body mass index is 32.04 kg/m.  Data Reviewed:   CBC: Recent Labs  Lab 09/23/23 1350 09/26/23 0536 09/27/23 0422 09/28/23 0520  WBC 6.3 7.1 7.3 7.3  HGB 8.5* 9.7* 8.9* 9.2*  HCT 24.5* 27.9* 26.5* 27.2*  MCV 85.4 84.8 86.6 86.6  PLT 140* 201 203 223   Basic Metabolic Panel: Recent Labs  Lab 09/23/23 0500 09/24/23 0808 09/25/23 0328 09/26/23 0536 09/27/23 0422 09/28/23 0520  NA 134* 137 134* 134* 134* 135  K 3.2* 3.4* 3.1* 3.2* 3.3* 3.6  CL 96* 93* 93* 93* 97* 97*  CO2 25 28 31 30 29 29   GLUCOSE 93 98 99 95 98 99  BUN 53* 44* 55* 65* 53* 61*  CREATININE 7.12* 6.04* 6.87* 7.23* 5.93* 6.17*  CALCIUM 7.5* 7.8* 7.6* 7.9* 7.7* 7.9*  MG 1.9  --   --   --   --  1.9  PHOS 6.4* 5.2*  --  5.4* 4.7*  --    GFR: Estimated Creatinine Clearance: 12 mL/min (A) (by C-G formula based on SCr of 6.17 mg/dL (H)). Liver Function Tests: Recent Labs  Lab 09/22/23 0527 09/23/23 0500 09/24/23 0808 09/25/23 0328 09/26/23 0536 09/27/23 0422  AST 198*  --   --  104*  --    --   ALT 45*  --   --  37  --   --   ALKPHOS 36*  --   --  35*  --   --   BILITOT 0.6  --   --  1.3*  --   --   PROT 4.7*  --   --  4.3*  --   --   ALBUMIN 2.2* 2.0* 2.0* 1.7* 1.8* 1.7*   No results for input(s): "LIPASE", "AMYLASE" in the last 168 hours. No results for input(s): "AMMONIA" in the last 168  hours. Coagulation Profile: No results for input(s): "INR", "PROTIME" in the last 168 hours. Cardiac Enzymes: Recent Labs  Lab 09/24/23 0808 09/25/23 0328 09/26/23 0536 09/27/23 0422 09/28/23 0520  CKTOTAL 4,884* 2,422* 1,692* 1,056* 635*   BNP (last 3 results) No results for input(s): "PROBNP" in the last 8760 hours. HbA1C: No results for input(s): "HGBA1C" in the last 72 hours. CBG: No results for input(s): "GLUCAP" in the last 168 hours. Lipid Profile: No results for input(s): "CHOL", "HDL", "LDLCALC", "TRIG", "CHOLHDL", "LDLDIRECT" in the last 72 hours. Thyroid Function Tests: No results for input(s): "TSH", "T4TOTAL", "FREET4", "T3FREE", "THYROIDAB" in the last 72 hours. Anemia Panel: No results for input(s): "VITAMINB12", "FOLATE", "FERRITIN", "TIBC", "IRON", "RETICCTPCT" in the last 72 hours. Sepsis Labs: No results for input(s): "PROCALCITON", "LATICACIDVEN" in the last 168 hours.  No results found for this or any previous visit (from the past 240 hour(s)).       Radiology Studies: No results found.         LOS: 8 days   Time spent= 35 mins    Miguel Rota, MD [facility]  If 7PM-7AM, please contact night-coverage  09/28/2023, 11:58 AM
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
## 2023-09-29 ENCOUNTER — Encounter (HOSPITAL_COMMUNITY): Payer: Medicare PPO

## 2023-09-29 DIAGNOSIS — N1832 Chronic kidney disease, stage 3b: Secondary | ICD-10-CM | POA: Diagnosis not present

## 2023-09-29 DIAGNOSIS — N179 Acute kidney failure, unspecified: Secondary | ICD-10-CM | POA: Diagnosis not present

## 2023-09-29 LAB — CBC
HCT: 24.5 % — ABNORMAL LOW (ref 39.0–52.0)
Hemoglobin: 8.4 g/dL — ABNORMAL LOW (ref 13.0–17.0)
MCH: 30 pg (ref 26.0–34.0)
MCHC: 34.3 g/dL (ref 30.0–36.0)
MCV: 87.5 fL (ref 80.0–100.0)
Platelets: 279 10*3/uL (ref 150–400)
RBC: 2.8 MIL/uL — ABNORMAL LOW (ref 4.22–5.81)
RDW: 13 % (ref 11.5–15.5)
WBC: 8.2 10*3/uL (ref 4.0–10.5)
nRBC: 0 % (ref 0.0–0.2)

## 2023-09-29 LAB — BASIC METABOLIC PANEL
Anion gap: 9 (ref 5–15)
BUN: 69 mg/dL — ABNORMAL HIGH (ref 8–23)
CO2: 29 mmol/L (ref 22–32)
Calcium: 8 mg/dL — ABNORMAL LOW (ref 8.9–10.3)
Chloride: 97 mmol/L — ABNORMAL LOW (ref 98–111)
Creatinine, Ser: 6.48 mg/dL — ABNORMAL HIGH (ref 0.61–1.24)
GFR, Estimated: 8 mL/min — ABNORMAL LOW (ref >60)
Glucose, Bld: 98 mg/dL (ref 70–99)
Potassium: 3.4 mmol/L — ABNORMAL LOW (ref 3.5–5.1)
Sodium: 135 mmol/L (ref 135–145)

## 2023-09-29 LAB — MAGNESIUM: Magnesium: 2 mg/dL (ref 1.7–2.4)

## 2023-09-29 MED ORDER — BUPROPION HCL ER (XL) 300 MG PO TB24
300.0000 mg | ORAL_TABLET | Freq: Every day | ORAL | Status: DC
  Administered 2023-09-29 – 2023-10-31 (×33): 300 mg via ORAL
  Filled 2023-09-29 (×33): qty 1

## 2023-09-29 MED ORDER — POTASSIUM CHLORIDE 20 MEQ PO PACK
40.0000 meq | PACK | Freq: Once | ORAL | Status: AC
  Administered 2023-09-29: 40 meq via ORAL
  Filled 2023-09-29: qty 2

## 2023-09-29 MED ORDER — SODIUM CHLORIDE 0.9 % IV BOLUS
250.0000 mL | Freq: Once | INTRAVENOUS | Status: AC
  Administered 2023-09-29: 250 mL via INTRAVENOUS

## 2023-09-29 MED ORDER — VITAMIN B-12 100 MCG PO TABS
100.0000 ug | ORAL_TABLET | Freq: Every day | ORAL | Status: DC
  Administered 2023-09-29 – 2023-10-31 (×33): 100 ug via ORAL
  Filled 2023-09-29 (×33): qty 1

## 2023-09-29 MED ORDER — ESCITALOPRAM OXALATE 10 MG PO TABS
10.0000 mg | ORAL_TABLET | Freq: Every day | ORAL | Status: DC
  Administered 2023-09-29 – 2023-10-31 (×33): 10 mg via ORAL
  Filled 2023-09-29 (×34): qty 1

## 2023-09-29 MED ORDER — VITAMIN D 25 MCG (1000 UNIT) PO TABS
1000.0000 [IU] | ORAL_TABLET | Freq: Every day | ORAL | Status: DC
  Administered 2023-09-29 – 2023-10-31 (×33): 1000 [IU] via ORAL
  Filled 2023-09-29 (×37): qty 1
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
## 2023-09-29 NOTE — Progress Notes (Signed)
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 Ella Jubilee MD   Jorge West is an 77 y.o. male  with a PMHx significant for HF, atrial fibrillation, coronary artery disease, paroxysmal atrial fibrillation, HTN, CKD3B, and BPH who presented on 09/19/23 with trauma. He was changing a tire on is car and it fell on his leg. Apparently, he was trapped for about 2 hrs until his family found him and called EMS. Ultimately he was admitted for L femoral condyle avulsion fracture, and AoCKD3B.   Subjective: Patient feels well today other than being tired.  Denies any shortness of breath or chest pain.  Urine output seems to be picking up.  1.2 L yesterday and already made 800 this morning if not more.  Trend in Creatinine: Creatinine, Ser  Date/Time Value Ref Range Status  09/29/2023 05:10 AM 6.48 (H) 0.61 - 1.24 mg/dL Final  21/30/8657 84:69 AM 6.17 (H) 0.61 - 1.24 mg/dL Final  62/95/2841 32:44 AM 5.93 (H) 0.61 - 1.24 mg/dL Final  11/23/7251 66:44 AM 7.23 (H) 0.61 - 1.24 mg/dL Final  03/47/4259 56:38 AM 6.87 (H) 0.61 - 1.24 mg/dL Final  75/64/3329 51:88 AM 6.04 (H) 0.61 - 1.24 mg/dL Final  41/66/0630 16:01 AM 7.12 (H) 0.61 - 1.24 mg/dL Final  09/32/3557 32:20 AM 6.03 (H) 0.61 - 1.24 mg/dL Final  25/42/7062 37:62 AM 4.71 (H) 0.61 - 1.24 mg/dL Final  83/15/1761 60:73 AM 3.21 (H) 0.61 - 1.24 mg/dL Final  71/04/2693 85:46 PM 2.30 (H) 0.61 - 1.24 mg/dL Final  27/01/5008 38:18 PM 2.36 (H) 0.61 - 1.24 mg/dL Final  29/93/7169 67:89 AM 1.77 (H) 0.61 - 1.24 mg/dL Final  38/08/1750 02:58 AM 2.02 (H) 0.61 - 1.24 mg/dL Final  52/77/8242 35:36 PM 2.07 (H) 0.61 - 1.24 mg/dL Final  14/43/1540 08:67 AM 1.45 (H) 0.61 - 1.24 mg/dL Final  61/95/0932 67:12 AM 1.22 0.61 - 1.24 mg/dL Final  45/80/9983 38:25 PM 1.20 0.50 - 1.35 mg/dL Final  05/39/7673 41:93 PM 1.01 0.50 - 1.35 mg/dL Final    PMH:   Past Medical History:  Diagnosis Date   Atrial fibrillation (HCC)    Had an ablation done    Atypical mole 06/14/2000   moderate/marked on mid back Nino Glow)   Atypical mole 11/29/2001   slight/moderate on right shoulder Nino Glow)   Atypical mole 11/29/2001   moderate on right lateral abdomen Nino Glow)   Atypical mole 11/29/2001   moderate on right forearm Nino Glow)   Atypical mole 01/11/2007   moderate on lower mid back   Atypical mole 07/14/2008   SK and atypical solar lentigo on left jawline (widershave)   Basal cell carcinoma 07/29/1996   back left lower ear - CX3+excision   Basal cell carcinoma 09/22/2004   basosquamous on right tip of nose (MOHs)   Basal cell carcinoma 10/20/2005   superficial on upper center back - CX3+5FU   Basal cell carcinoma 10/20/2011   right upper back - tx p bx   Basal cell carcinoma 02/07/2018   superficial/nodular on left upper arm - CX3+cautery+5FU   Basal cell carcinoma 10/23/2018   infiltrative on left [facility]burn [facility])   Benign prostate hyperplasia    Cataract    left and removed   Depression    Depression    Diverticulosis    Erectile dysfunction    Hyperlipidemia    Hypertension    Impaired fasting glucose    Nocturia    Personal history of colonic polyps 04/19/2005   SCCA (squamous cell  carcinoma) of skin 03/17/2021   Left Temporal Scalp (well diff)   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   left post auricular - clear at visit on 12/28/2001   Squamous cell carcinoma of skin 10/08/2003   well differentiated below outer left eye - MOHs   Squamous cell carcinoma of skin 06/02/2008   well differentiated behind left ear   Squamous cell carcinoma of skin 10/20/2011   KA on left elbow   Squamous cell carcinoma of skin 03/02/2015   well differentiated on right outer cheek - CX3+excision   Squamous cell carcinoma of skin 03/01/2017   well differentiated on left forearm - tx p bx   Squamous cell carcinoma of skin 03/07/2018   moderately differentiated on left jawline - CX3+excision    PSH:   Past Surgical  History:  Procedure Laterality Date   CATARACT EXTRACTION     COLONOSCOPY     IR FLUORO GUIDE CV LINE RIGHT  09/22/2023   IR US GUIDE VASC ACCESS RIGHT  09/22/2023   OPEN REDUCTION INTERNAL FIXATION (ORIF) METACARPAL  09/24/2012   Procedure: OPEN REDUCTION INTERNAL FIXATION (ORIF) METACARPAL;  Surgeon: Sharma Covert, MD;  Location: [facility] OR;  Service: Orthopedics;  Laterality: Left;  and proximal phalanges.   surgery for skin cancer      Allergies:  Allergies  Allergen Reactions   Terazosin Hcl     Dizziness and hypotension    Medications:   Prior to Admission medications   Medication Sig Start Date End Date Taking? Authorizing Provider  acetaminophen (TYLENOL) 325 MG tablet Take 2 tablets (650 mg total) by mouth every 6 (six) hours as needed for mild pain (or Fever >/= 101). 06/03/19  Yes Emokpae, Courage, MD  ALPRAZolam (XANAX) 0.5 MG tablet Take 0.5 mg by mouth 2 (two) times daily as needed for anxiety.   Yes [provider]  amLODipine (NORVASC) 10 MG tablet Take 10 mg by mouth daily. 08/19/23  Yes [provider]  apixaban (ELIQUIS) 5 MG TABS tablet Take 1 tablet (5 mg total) by mouth 2 (two) times daily. Blood thinner for stroke prevention 06/03/19  Yes Emokpae, Courage, MD  brimonidine-timolol (COMBIGAN) 0.2-0.5 % ophthalmic solution Place 1 drop into the left eye daily. 02/29/16  Yes [provider]  buPROPion (WELLBUTRIN XL) 300 MG 24 hr tablet Take 300 mg by mouth daily.   Yes [provider]  Cholecalciferol (VITAMIN D3) 3000 UNITS TABS Take 1 tablet by mouth daily.    Yes [provider]  clopidogrel (PLAVIX) 75 MG tablet Take 75 mg by mouth daily.   Yes [provider]  cyanocobalamin (VITAMIN B12) 100 MCG tablet Take 100 mcg by mouth daily.   Yes [provider]  escitalopram (LEXAPRO) 10 MG tablet Take 10 mg by mouth daily.   Yes [provider]  metoprolol succinate (TOPROL-XL) 25 MG 24 hr tablet Take 25 mg  by mouth daily. 08/22/23  Yes [provider]  nitroGLYCERIN (NITROSTAT) 0.4 MG SL tablet Place 0.4 mg under the tongue every 5 (five) minutes as needed for chest pain.   Yes [provider]  Omega-3 Fatty Acids (FISH OIL PO) Take 1 capsule by mouth daily.    Yes [provider]  pantoprazole (PROTONIX) 40 MG tablet Take 40 mg by mouth daily.   Yes [provider]  rosuvastatin (CRESTOR) 40 MG tablet Take 40 mg by mouth daily.   Yes [provider]  sacubitril-valsartan (ENTRESTO) 24-26 MG Take 1  tablet by mouth daily.   Yes [provider]  vitamin C (ASCORBIC ACID) 500 MG tablet Take 1 tablet (500 mg total) by mouth daily. 09/25/12  Yes Bradly Bienenstock, MD    Inpatient medications:  sodium chloride   Intravenous Once   sodium chloride   Intravenous Once   amiodarone  200 mg Oral Daily   apixaban  5 mg Oral BID   brimonidine  1 drop Left Eye Daily   And   timolol  1 drop Left Eye Daily   Chlorhexidine Gluconate Cloth  6 each Topical Daily   clopidogrel  75 mg Oral Daily   feeding supplement  1 Container Oral Q24H   feeding supplement  237 mL Oral Q24H   metoprolol succinate  25 mg Oral Daily   midodrine  10 mg Oral TID WC   multivitamin  1 tablet Oral QHS   neomycin-bacitracin-polymyxin   Topical BID   sodium chloride flush  3 mL Intravenous Q12H    Discontinued Meds:   Medications Discontinued During This Encounter  Medication Reason   methocarbamol (ROBAXIN) 500 MG tablet No longer needed (for PRN medications)   metoprolol tartrate (LOPRESSOR) 25 MG tablet Discontinued by provider   pravastatin (PRAVACHOL) 20 MG tablet Discontinued by provider   amLODipine (NORVASC) 5 MG tablet Dose change   clopidogrel (PLAVIX) tablet 75 mg    metoprolol tartrate (LOPRESSOR) tablet 12.5 mg    dextrose 5 % in lactated ringers infusion    rosuvastatin (CRESTOR) tablet 40 mg    dextrose 5 % in lactated ringers infusion    midodrine  (PROAMATINE) tablet 5 mg    HYDROmorphone (DILAUDID) injection 0.5 mg    heparin ADULT infusion 100 units/mL (25000 units/274mL) Change in therapy   0.9 %  sodium chloride infusion    HYDROmorphone (DILAUDID) injection 0.5-1 mg    apixaban (ELIQUIS) tablet 5 mg    clopidogrel (PLAVIX) tablet 75 mg    0.9 %  sodium chloride infusion    brimonidine-timolol (COMBIGAN) 0.2-0.5 % ophthalmic solution 1 drop P&T Policy: Therapeutic Substitute   amiodarone (NEXTERONE PREMIX) 360-4.14 MG/200ML-% (1.8 mg/mL) IV infusion    amiodarone (NEXTERONE PREMIX) 360-4.14 MG/200ML-% (1.8 mg/mL) IV infusion    heparin injection 1,000 Units Patient Transfer   anticoagulant sodium citrate solution 5 mL Patient Transfer   alteplase (CATHFLO ACTIVASE) injection 2 mg Patient Transfer   sodium bicarbonate 150 mEq in sterile water 1,150 mL infusion    senna-docusate (Senokot-S) tablet 1 tablet     Social History:  reports that he has never smoked. He has quit using smokeless tobacco. He reports that he does not currently use alcohol. He reports that he does not use drugs.  Family History:   Family History  Problem Relation Age of Onset   Cancer Other        family history    Colon cancer Neg Hx    Colon polyps Neg Hx    Rectal cancer Neg Hx    Stomach cancer Neg Hx     Pertinent items noted in HPI and remainder of comprehensive ROS otherwise negative. Weight change:   Intake/Output Summary (Last 24 hours) at 09/29/2023 0947 Last data filed at 09/29/2023 0737 Gross per 24 hour  Intake 360 ml  Output 1575 ml  Net -1215 ml   BP 126/81 (BP Location: Right Arm)   Pulse (!) 105   Temp 97.6 F (36.4 C) (Oral)   Resp 18   Ht 5\' 10"  (1.778  m)   Wt 101.3 kg   SpO2 93%   BMI 32.04 kg/m  Vitals:   09/29/23 0409 09/29/23 0700 09/29/23 0737 09/29/23 0937  BP: (!) 110/90  126/81   Pulse: (!) 116  (!) 101 (!) 105  Resp: 15 16 18    Temp: (!) 97.5 F (36.4 C)  97.6 F (36.4 C)   TempSrc: Oral  Oral    SpO2: 93%  94% 93%  Weight:      Height:         Physical Exam: General: Laying in bed, NAD. Drowsy but alert to vocal stimuli. Answers questions appropriately. Head: No apparent abnormalities. CV: Irregularly irregular. No rub. Lungs: CTAB to anterior lung fields.  Abd: Soft, non-tender to palpation of four quadrants and epigastrium. No guarding or rebound. Extremities: No peripheral edema noted to RLE. LLE with brace and wound dressing applied. Moderate non-pitting edema of LLE. Skin: No petichiae or purpura noted.  Psych: Mood and affect appropriate.  Labs: Basic Metabolic Panel: Recent Labs  Lab 09/23/23 0500 09/24/23 0808 09/25/23 0328 09/26/23 0536 09/27/23 0422 09/28/23 0520 09/29/23 0510  NA 134* 137 134* 134* 134* 135 135  K 3.2* 3.4* 3.1* 3.2* 3.3* 3.6 3.4*  CL 96* 93* 93* 93* 97* 97* 97*  CO2 25 28 31 30 29 29 29   GLUCOSE 93 98 99 95 98 99 98  BUN 53* 44* 55* 65* 53* 61* 69*  CREATININE 7.12* 6.04* 6.87* 7.23* 5.93* 6.17* 6.48*  ALBUMIN 2.0* 2.0* 1.7* 1.8* 1.7*  --   --   CALCIUM 7.5* 7.8* 7.6* 7.9* 7.7* 7.9* 8.0*  PHOS 6.4* 5.2*  --  5.4* 4.7*  --   --    Liver Function Tests: Recent Labs  Lab 09/25/23 0328 09/26/23 0536 09/27/23 0422  AST 104*  --   --   ALT 37  --   --   ALKPHOS 35*  --   --   BILITOT 1.3*  --   --   PROT 4.3*  --   --   ALBUMIN 1.7* 1.8* 1.7*   No results for input(s): "LIPASE", "AMYLASE" in the last 168 hours. No results for input(s): "AMMONIA" in the last 168 hours. CBC: Recent Labs  Lab 09/26/23 0536 09/27/23 0422 09/28/23 0520 09/29/23 0510  WBC 7.1 7.3 7.3 8.2  HGB 9.7* 8.9* 9.2* 8.4*  HCT 27.9* 26.5* 27.2* 24.5*  MCV 84.8 86.6 86.6 87.5  PLT 201 203 223 279   PT/INR: @LABRCNTIP (inr:5) Cardiac Enzymes: ) Recent Labs  Lab 09/24/23 0808 09/25/23 0328 09/26/23 0536 09/27/23 0422 09/28/23 0520  CKTOTAL 4,884* 2,422* 1,692* 1,056* 635*   CBG: No results for input(s): "GLUCAP" in the last 168 hours.  Iron  Studies: No results for input(s): "IRON", "TIBC", "TRANSFERRIN", "FERRITIN" in the last 168 hours.  Xrays/Other Studies: No results found.   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 Urine output seems to be picking up.  Creatinine plateauing.  No dialysis today.  Watch for recovery.  Baseline creatinine 2.3 Traumatic rhabdomyolysis, CK levels proving slowly Continue with oral hydration.  Seems to be improving Hypokalemia Replacement as needed Acute hypoxemic respiratory failure, overall stable Permanent atrial fibrillation with RVR, followed by cardiology, on amiodarone.  Apixaban restarted 5 mg BID. CAD with PCI placed about 2 months ago, per cardiology. Plavix 75 mg daily. 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  Closed left femoral condyle avulsion fracture, tibial plateau fracture, and brace currently, orthopedics following. Hypertension but blood pressures are stable. Hold Entresto for AKI MAP >65 for renal perfusion  Anemia: Continue under transfuse as needed.  Multifactorial Medication Issues; Preferred narcotic agents for pain control are hydromorphone, fentanyl, and methadone. Morphine should not be used.  Baclofen should be avoided Avoid oral sodium phosphate and magnesium citrate based laxatives / bowel preps

## 2023-09-29 NOTE — Progress Notes (Signed)
Inpatient Rehab Admissions Coordinator:   Note plan to hold dialysis today and see how he does.  I will go ahead and start insurance auth request for potential admit early next week.    Estill Dooms, PT, DPT Admissions Coordinator [phone] 09/29/23  10:13 AM

## 2023-09-29 NOTE — TOC CM/SW Note (Signed)
Transition of Care [facility]) - Inpatient Brief Assessment   Patient Details  Name: Jorge West MRN: [id number] Date of Birth: 12/29/1945  [facility] CM/SW Contact:    Mearl Latin, LCSW Phone Number: 09/29/2023, 9:23 AM   Clinical Narrative: Patient admitted from home with spouse. CIR following for medical progression. TOC remains available for discharge needs.    Transition of Care Asessment: Insurance and Status: Insurance coverage has been reviewed Patient has primary care physician: Yes Home environment has been reviewed: From home Prior level of function:: Independent Prior/Current Home Services: No current home services Social Determinants of Health Reivew: SDOH reviewed no interventions necessary Readmission risk has been reviewed: Yes Transition of care needs: transition of care needs identified, TOC will continue to follow

## 2023-09-29 NOTE — Plan of Care (Signed)

## 2023-09-29 NOTE — Plan of Care (Signed)

## 2023-09-30 DIAGNOSIS — N1832 Chronic kidney disease, stage 3b: Secondary | ICD-10-CM | POA: Diagnosis not present

## 2023-09-30 DIAGNOSIS — N179 Acute kidney failure, unspecified: Secondary | ICD-10-CM | POA: Diagnosis not present

## 2023-09-30 LAB — CBC
HCT: 24.1 % — ABNORMAL LOW (ref 39.0–52.0)
Hemoglobin: 8.1 g/dL — ABNORMAL LOW (ref 13.0–17.0)
MCH: 30 pg (ref 26.0–34.0)
MCHC: 33.6 g/dL (ref 30.0–36.0)
MCV: 89.3 fL (ref 80.0–100.0)
Platelets: 316 10*3/uL (ref 150–400)
RBC: 2.7 MIL/uL — ABNORMAL LOW (ref 4.22–5.81)
RDW: 13.3 % (ref 11.5–15.5)
WBC: 8.4 10*3/uL (ref 4.0–10.5)
nRBC: 0 % (ref 0.0–0.2)

## 2023-09-30 LAB — BASIC METABOLIC PANEL
Anion gap: 11 (ref 5–15)
BUN: 69 mg/dL — ABNORMAL HIGH (ref 8–23)
CO2: 28 mmol/L (ref 22–32)
Calcium: 8.6 mg/dL — ABNORMAL LOW (ref 8.9–10.3)
Chloride: 99 mmol/L (ref 98–111)
Creatinine, Ser: 6.34 mg/dL — ABNORMAL HIGH (ref 0.61–1.24)
GFR, Estimated: 8 mL/min — ABNORMAL LOW (ref >60)
Glucose, Bld: 89 mg/dL (ref 70–99)
Potassium: 3.8 mmol/L (ref 3.5–5.1)
Sodium: 138 mmol/L (ref 135–145)

## 2023-09-30 LAB — MAGNESIUM: Magnesium: 2 mg/dL (ref 1.7–2.4)

## 2023-09-30 NOTE — Progress Notes (Signed)
[facility]  - [place] at [facility]   PATIENT NAME: Jorge West    MR#:  [id number]  DATE OF BIRTH:  [birth year]  SUBJECTIVE:  wife at bedside. Overall appears to be in good spirit. Had a good night sleep. Feels more energetic and eating up bit better. Encouraged to work with PT and continue improving oral intake. Good urine output. No dialysis planned for the weekend. Foley to be removed today  VITALS:  Blood pressure (!) 129/95, pulse 98, temperature 97.6 F (36.4 C), temperature source Oral, resp. rate 20, height 5\' 10"  (1.778 m), weight 101.3 kg, SpO2 97%.  PHYSICAL EXAMINATION:   GENERAL:  77 y.o.-year-old patient with no acute distress. obese LUNGS: Normal breath sounds bilaterally, no wheezing CARDIOVASCULAR: S1, S2 normal. No murmur   ABDOMEN: Soft, nontender, nondistended. Bowel sounds present.  EXTREMITIES:left knee brace + NEUROLOGIC: nonfocal  patient is alert and awake  LABORATORY PANEL:  CBC Recent Labs  Lab 09/30/23 0441  WBC 8.4  HGB 8.1*  HCT 24.1*  PLT 316    Chemistries  Recent Labs  Lab 09/25/23 0328 09/26/23 0536 09/30/23 0441  NA 134*   < > 138  K 3.1*   < > 3.8  CL 93*   < > 99  CO2 31   < > 28  GLUCOSE 99   < > 89  BUN 55*   < > 69*  CREATININE 6.87*   < > 6.34*  CALCIUM 7.6*   < > 8.6*  MG  --    < > 2.0  AST 104*  --   --   ALT 37  --   --   ALKPHOS 35*  --   --   BILITOT 1.3*  --   --    < > = values in this interval not displayed.     77 year old with history of CHF, A-fib, CAD, HTN, CKD, BPH presenting with left lower extremity trauma Anteroapical and then falling on the floor.  Apparently was trapped for about 2 hours until family found him.  Upon admission CT head, cervical spine negative, CT chest abdomen pelvis showed acute T12 compression fracture, CT of lower extremity showed asymmetric enlargement of muscle on the left.  Eventually found to be in severe rhabdomyolysis causing acute kidney injury.   Patient started on dialysis.  Over the producing urine creatinine slightly trending up including BUN.     Assessment & Plan:    Acute Oliguric kidney injury superimposed on stage 3b chronic kidney disease (HCC) Secondary to Traumatic Rhabdomyolysis, improving --Unfortunately renal failure/ATN secondary to rhabdomyolysis.  -- HD sessions per nephrology team.  -- Oligouric with elevated creatinine and BUN.--making good UOP. HD on hold for now -- Midodrine for soft blood pressures -Right IJ HD nontunneled catheter --baseline creat 2.3 --creat for now plateued to 6.0    --ok to remove foley per Nephrology   Atrial fibrillation with RVR (HCC) Continue amiodarone, Toprol-XL.  Eliquis.   Scrotal edema - Elevate   Hypokalemia - Managed with HD --k 3.4   Acute on chronic diastolic CHF (congestive heart failure) (HCC) Echocardiogram with preserved LV systolic function with EF 60- to 65%, mild LVH, RV with normal systolic function, no significant valvular disease.  --On metoprolol.  Avoiding ARB/ARNI due to AKI   Essential hypertension --On metoprolol.  IV as needed.   Closed left femoral condyle avulsion fracture (HCC)--cont brace T12 compression fracture.    Coronary artery disease status post PCI September 2024 --  PCI at [facility].  Currently on Plavix, Eliquis   Anemia of chronic disease Follow up hgb is 9.7     Malnutrition of moderate degree Continue nutritional supplements.    DVT prophylaxis: Eliquis Code Status: Full code Family Communication: Spouse at bedside Status is: Inpatient Remains inpatient appropriate because:severity of illness. CIR following medical progression    PT/OT--CIR  TOTAL TIME TAKING CARE OF THIS PATIENT: 35 minutes.  >50% time spent on counselling and coordination of care  Note: This dictation was prepared with Dragon dictation along with smaller phrase technology. Any transcriptional errors that result from this process are  unintentional.  Enedina Finner M.D    [facility]   CC: Primary care physician; Elizabeth Palau, FNP

## 2023-09-30 NOTE — Plan of Care (Signed)

## 2023-09-30 NOTE — Plan of Care (Signed)

## 2023-09-30 NOTE — Progress Notes (Addendum)
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 Ella Jubilee MD   Jorge West is an 77 y.o. male  with a PMHx significant for HF, atrial fibrillation, coronary artery disease, paroxysmal atrial fibrillation, HTN, CKD3B, and BPH who presented on 09/19/23 with trauma. He was changing a tire on is car and it fell on his leg. Apparently, he was trapped for about 2 hrs until his family found him and called EMS. Ultimately he was admitted for L femoral condyle avulsion fracture, and AoCKD3B.   Subjective: Patient has no complaints today.  Continues to have fairly decent urine output with 1.5 L made.  Wife thinks the patient has more energy today.  Trend in Creatinine: Creatinine, Ser  Date/Time Value Ref Range Status  09/30/2023 04:41 AM 6.34 (H) 0.61 - 1.24 mg/dL Final  95/62/1308 65:78 AM 6.48 (H) 0.61 - 1.24 mg/dL Final  46/96/2952 84:13 AM 6.17 (H) 0.61 - 1.24 mg/dL Final  24/40/1027 25:36 AM 5.93 (H) 0.61 - 1.24 mg/dL Final  64/40/3474 25:95 AM 7.23 (H) 0.61 - 1.24 mg/dL Final  63/87/5643 32:95 AM 6.87 (H) 0.61 - 1.24 mg/dL Final  18/84/1660 63:01 AM 6.04 (H) 0.61 - 1.24 mg/dL Final  60/08/9322 55:73 AM 7.12 (H) 0.61 - 1.24 mg/dL Final  22/12/5425 06:23 AM 6.03 (H) 0.61 - 1.24 mg/dL Final  76/28/3151 76:16 AM 4.71 (H) 0.61 - 1.24 mg/dL Final  07/37/1062 69:48 AM 3.21 (H) 0.61 - 1.24 mg/dL Final  54/62/7035 00:93 PM 2.30 (H) 0.61 - 1.24 mg/dL Final  81/82/9937 16:96 PM 2.36 (H) 0.61 - 1.24 mg/dL Final  78/93/8101 75:10 AM 1.77 (H) 0.61 - 1.24 mg/dL Final  25/85/2778 24:23 AM 2.02 (H) 0.61 - 1.24 mg/dL Final  53/61/4431 54:00 PM 2.07 (H) 0.61 - 1.24 mg/dL Final  86/76/1950 93:26 AM 1.45 (H) 0.61 - 1.24 mg/dL Final  71/24/5809 98:33 AM 1.22 0.61 - 1.24 mg/dL Final  82/50/5397 67:34 PM 1.20 0.50 - 1.35 mg/dL Final  19/37/9024 09:73 PM 1.01 0.50 - 1.35 mg/dL Final    PMH:   Past Medical History:  Diagnosis Date   Atrial fibrillation (HCC)    Had an ablation done    Atypical mole 06/14/2000   moderate/marked on mid back Nino Glow)   Atypical mole 11/29/2001   slight/moderate on right shoulder Nino Glow)   Atypical mole 11/29/2001   moderate on right lateral abdomen Nino Glow)   Atypical mole 11/29/2001   moderate on right forearm Nino Glow)   Atypical mole 01/11/2007   moderate on lower mid back   Atypical mole 07/14/2008   SK and atypical solar lentigo on left jawline (widershave)   Basal cell carcinoma 07/29/1996   back left lower ear - CX3+excision   Basal cell carcinoma 09/22/2004   basosquamous on right tip of nose (MOHs)   Basal cell carcinoma 10/20/2005   superficial on upper center back - CX3+5FU   Basal cell carcinoma 10/20/2011   right upper back - tx p bx   Basal cell carcinoma 02/07/2018   superficial/nodular on left upper arm - CX3+cautery+5FU   Basal cell carcinoma 10/23/2018   infiltrative on left sideburn [facility])   Benign prostate hyperplasia    Cataract    left and removed   Depression    Depression    Diverticulosis    Erectile dysfunction    Hyperlipidemia    Hypertension    Impaired fasting glucose    Nocturia    Personal history of colonic polyps 04/19/2005   SCCA (squamous cell carcinoma)  of skin 03/17/2021   Left Temporal Scalp (well diff)   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   left post auricular - clear at visit on 12/28/2001   Squamous cell carcinoma of skin 10/08/2003   well differentiated below outer left eye - MOHs   Squamous cell carcinoma of skin 06/02/2008   well differentiated behind left ear   Squamous cell carcinoma of skin 10/20/2011   KA on left elbow   Squamous cell carcinoma of skin 03/02/2015   well differentiated on right outer cheek - CX3+excision   Squamous cell carcinoma of skin 03/01/2017   well differentiated on left forearm - tx p bx   Squamous cell carcinoma of skin 03/07/2018   moderately differentiated on left jawline - CX3+excision    PSH:   Past Surgical  History:  Procedure Laterality Date   CATARACT EXTRACTION     COLONOSCOPY     IR FLUORO GUIDE CV LINE RIGHT  09/22/2023   IR US GUIDE VASC ACCESS RIGHT  09/22/2023   OPEN REDUCTION INTERNAL FIXATION (ORIF) METACARPAL  09/24/2012   Procedure: OPEN REDUCTION INTERNAL FIXATION (ORIF) METACARPAL;  Surgeon: Sharma Covert, MD;  Location: [facility] OR;  Service: Orthopedics;  Laterality: Left;  and proximal phalanges.   surgery for skin cancer      Allergies:  Allergies  Allergen Reactions   Terazosin Hcl     Dizziness and hypotension    Medications:   Prior to Admission medications   Medication Sig Start Date End Date Taking? Authorizing Provider  acetaminophen (TYLENOL) 325 MG tablet Take 2 tablets (650 mg total) by mouth every 6 (six) hours as needed for mild pain (or Fever >/= 101). 06/03/19  Yes Emokpae, Courage, MD  ALPRAZolam (XANAX) 0.5 MG tablet Take 0.5 mg by mouth 2 (two) times daily as needed for anxiety.   Yes [provider]  amLODipine (NORVASC) 10 MG tablet Take 10 mg by mouth daily. 08/19/23  Yes [provider]  apixaban (ELIQUIS) 5 MG TABS tablet Take 1 tablet (5 mg total) by mouth 2 (two) times daily. Blood thinner for stroke prevention 06/03/19  Yes Emokpae, Courage, MD  brimonidine-timolol (COMBIGAN) 0.2-0.5 % ophthalmic solution Place 1 drop into the left eye daily. 02/29/16  Yes [provider]  buPROPion (WELLBUTRIN XL) 300 MG 24 hr tablet Take 300 mg by mouth daily.   Yes [provider]  Cholecalciferol (VITAMIN D3) 3000 UNITS TABS Take 1 tablet by mouth daily.    Yes [provider]  clopidogrel (PLAVIX) 75 MG tablet Take 75 mg by mouth daily.   Yes [provider]  cyanocobalamin (VITAMIN B12) 100 MCG tablet Take 100 mcg by mouth daily.   Yes [provider]  escitalopram (LEXAPRO) 10 MG tablet Take 10 mg by mouth daily.   Yes [provider]  metoprolol succinate (TOPROL-XL) 25 MG 24 hr tablet Take 25 mg  by mouth daily. 08/22/23  Yes [provider]  nitroGLYCERIN (NITROSTAT) 0.4 MG SL tablet Place 0.4 mg under the tongue every 5 (five) minutes as needed for chest pain.   Yes [provider]  Omega-3 Fatty Acids (FISH OIL PO) Take 1 capsule by mouth daily.    Yes [provider]  pantoprazole (PROTONIX) 40 MG tablet Take 40 mg by mouth daily.   Yes [provider]  rosuvastatin (CRESTOR) 40 MG tablet Take 40 mg by mouth daily.   Yes [provider]  sacubitril-valsartan (ENTRESTO) 24-26 MG Take 1 tablet  by mouth daily.   Yes [provider]  vitamin C (ASCORBIC ACID) 500 MG tablet Take 1 tablet (500 mg total) by mouth daily. 09/25/12  Yes Bradly Bienenstock, MD    Inpatient medications:  amiodarone  200 mg Oral Daily   apixaban  5 mg Oral BID   brimonidine  1 drop Left Eye Daily   And   timolol  1 drop Left Eye Daily   buPROPion  300 mg Oral Daily   Chlorhexidine Gluconate Cloth  6 each Topical Daily   cholecalciferol  1,000 Units Oral Daily   clopidogrel  75 mg Oral Daily   escitalopram  10 mg Oral Daily   feeding supplement  1 Container Oral Q24H   feeding supplement  237 mL Oral Q24H   metoprolol succinate  25 mg Oral Daily   midodrine  10 mg Oral TID WC   multivitamin  1 tablet Oral QHS   neomycin-bacitracin-polymyxin   Topical BID   sodium chloride flush  3 mL Intravenous Q12H   cyanocobalamin  100 mcg Oral Daily    Discontinued Meds:   Medications Discontinued During This Encounter  Medication Reason   methocarbamol (ROBAXIN) 500 MG tablet No longer needed (for PRN medications)   metoprolol tartrate (LOPRESSOR) 25 MG tablet Discontinued by provider   pravastatin (PRAVACHOL) 20 MG tablet Discontinued by provider   amLODipine (NORVASC) 5 MG tablet Dose change   clopidogrel (PLAVIX) tablet 75 mg    metoprolol tartrate (LOPRESSOR) tablet 12.5 mg    dextrose 5 % in lactated ringers infusion    rosuvastatin (CRESTOR) tablet 40 mg     dextrose 5 % in lactated ringers infusion    midodrine (PROAMATINE) tablet 5 mg    HYDROmorphone (DILAUDID) injection 0.5 mg    heparin ADULT infusion 100 units/mL (25000 units/269mL) Change in therapy   0.9 %  sodium chloride infusion    HYDROmorphone (DILAUDID) injection 0.5-1 mg    apixaban (ELIQUIS) tablet 5 mg    clopidogrel (PLAVIX) tablet 75 mg    0.9 %  sodium chloride infusion    brimonidine-timolol (COMBIGAN) 0.2-0.5 % ophthalmic solution 1 drop P&T Policy: Therapeutic Substitute   amiodarone (NEXTERONE PREMIX) 360-4.14 MG/200ML-% (1.8 mg/mL) IV infusion    amiodarone (NEXTERONE PREMIX) 360-4.14 MG/200ML-% (1.8 mg/mL) IV infusion    heparin injection 1,000 Units Patient Transfer   anticoagulant sodium citrate solution 5 mL Patient Transfer   alteplase (CATHFLO ACTIVASE) injection 2 mg Patient Transfer   sodium bicarbonate 150 mEq in sterile water 1,150 mL infusion    senna-docusate (Senokot-S) tablet 1 tablet    0.9 %  sodium chloride infusion (Manually program via Guardrails IV Fluids)    0.9 %  sodium chloride infusion (Manually program via Guardrails IV Fluids)     Social History:  reports that he has never smoked. He has quit using smokeless tobacco. He reports that he does not currently use alcohol. He reports that he does not use drugs.  Family History:   Family History  Problem Relation Age of Onset   Cancer Other        family history    Colon cancer Neg Hx    Colon polyps Neg Hx    Rectal cancer Neg Hx    Stomach cancer Neg Hx     Pertinent items noted in HPI and remainder of comprehensive ROS otherwise negative. Weight change:   Intake/Output Summary (Last 24 hours) at 09/30/2023 1610 Last data filed at 09/29/2023 2033 Gross per  24 hour  Intake 733.77 ml  Output 650 ml  Net 83.77 ml   BP (!) 129/95   Pulse 98   Temp 97.6 F (36.4 C) (Oral)   Resp 20   Ht 5\' 10"  (1.778 m)   Wt 101.3 kg   SpO2 97%   BMI 32.04 kg/m  Vitals:   09/30/23 0500  09/30/23 0802 09/30/23 0841 09/30/23 0906  BP: 118/83 (!) 129/95  (!) 129/95  Pulse: (!) 112 (!) 109  98  Resp: 20     Temp: 98.7 F (37.1 C) 97.6 F (36.4 C)    TempSrc: Oral Oral    SpO2: 94% 98% 97%   Weight:      Height:         Physical Exam: GEN: Lying in bed, nad ENT: no nasal discharge, mmm EYES: no scleral icterus, eomi CV: Tachycardic, no murmurs PULM: no iwob, bilateral chest rise ABD: NABS, non-distended SKIN: Abrasions covered with gauze, otherwise no rashes or jaundice EXT: no edema, warm and well perfused   Labs: Basic Metabolic Panel: Recent Labs  Lab 09/24/23 0808 09/25/23 0328 09/26/23 0536 09/27/23 0422 09/28/23 0520 09/29/23 0510 09/30/23 0441  NA 137 134* 134* 134* 135 135 138  K 3.4* 3.1* 3.2* 3.3* 3.6 3.4* 3.8  CL 93* 93* 93* 97* 97* 97* 99  CO2 28 31 30 29 29 29 28   GLUCOSE 98 99 95 98 99 98 89  BUN 44* 55* 65* 53* 61* 69* 69*  CREATININE 6.04* 6.87* 7.23* 5.93* 6.17* 6.48* 6.34*  ALBUMIN 2.0* 1.7* 1.8* 1.7*  --   --   --   CALCIUM 7.8* 7.6* 7.9* 7.7* 7.9* 8.0* 8.6*  PHOS 5.2*  --  5.4* 4.7*  --   --   --    Liver Function Tests: Recent Labs  Lab 09/25/23 0328 09/26/23 0536 09/27/23 0422  AST 104*  --   --   ALT 37  --   --   ALKPHOS 35*  --   --   BILITOT 1.3*  --   --   PROT 4.3*  --   --   ALBUMIN 1.7* 1.8* 1.7*   No results for input(s): "LIPASE", "AMYLASE" in the last 168 hours. No results for input(s): "AMMONIA" in the last 168 hours. CBC: Recent Labs  Lab 09/27/23 0422 09/28/23 0520 09/29/23 0510 09/30/23 0441  WBC 7.3 7.3 8.2 8.4  HGB 8.9* 9.2* 8.4* 8.1*  HCT 26.5* 27.2* 24.5* 24.1*  MCV 86.6 86.6 87.5 89.3  PLT 203 223 279 316   PT/INR: @LABRCNTIP (inr:5) Cardiac Enzymes: ) Recent Labs  Lab 09/24/23 0808 09/25/23 0328 09/26/23 0536 09/27/23 0422 09/28/23 0520  CKTOTAL 4,884* 2,422* 1,692* 1,056* 635*   CBG: No results for input(s): "GLUCAP" in the last 168 hours.  Iron Studies: No results for  input(s): "IRON", "TIBC", "TRANSFERRIN", "FERRITIN" in the last 168 hours.  Xrays/Other Studies: No results found.   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 Urine output seems to be picking up.  Creatinine plateauing.  Holding dialysis and monitoring for recovery baseline creatinine 2.3 Traumatic rhabdomyolysis, CK levels over all improved Continue with oral hydration.  Seems to be improving Hypokalemia Replacement as needed Acute hypoxemic respiratory failure, overall stable Permanent atrial fibrillation with RVR, followed by cardiology, on amiodarone.  Apixaban 5 mg BID. CAD with PCI placed about 2 months  ago, per cardiology. Plavix 75 mg daily. 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  Closed left femoral condyle avulsion fracture, tibial plateau fracture, and brace currently, orthopedics following. Hypertension but blood pressures are stable. Hold Entresto for AKI MAP >65 for renal perfusion  Blood pressure fairly low on midodrine now Anemia: Continue under transfuse as needed.  Multifactorial Medication Issues; Preferred narcotic agents for pain control are hydromorphone, fentanyl, and methadone. Morphine should not be used.  Baclofen should be avoided Avoid oral sodium phosphate and magnesium citrate based laxatives / bowel preps

## 2023-10-01 DIAGNOSIS — N179 Acute kidney failure, unspecified: Secondary | ICD-10-CM | POA: Diagnosis not present

## 2023-10-01 DIAGNOSIS — N1832 Chronic kidney disease, stage 3b: Secondary | ICD-10-CM | POA: Diagnosis not present

## 2023-10-01 LAB — RENAL FUNCTION PANEL
Albumin: 2.2 g/dL — ABNORMAL LOW (ref 3.5–5.0)
Anion gap: 12 (ref 5–15)
BUN: 68 mg/dL — ABNORMAL HIGH (ref 8–23)
CO2: 28 mmol/L (ref 22–32)
Calcium: 8.6 mg/dL — ABNORMAL LOW (ref 8.9–10.3)
Chloride: 97 mmol/L — ABNORMAL LOW (ref 98–111)
Creatinine, Ser: 5.66 mg/dL — ABNORMAL HIGH (ref 0.61–1.24)
GFR, Estimated: 10 mL/min — ABNORMAL LOW (ref >60)
Glucose, Bld: 123 mg/dL — ABNORMAL HIGH (ref 70–99)
Phosphorus: 5.8 mg/dL — ABNORMAL HIGH (ref 2.5–4.6)
Potassium: 3.8 mmol/L (ref 3.5–5.1)
Sodium: 137 mmol/L (ref 135–145)

## 2023-10-01 LAB — MAGNESIUM: Magnesium: 2 mg/dL (ref 1.7–2.4)

## 2023-10-01 NOTE — Progress Notes (Signed)
[facility]  - [place] at [facility]   PATIENT NAME: Jorge West    MR#:  [id number]  DATE OF BIRTH:  1946/03/08  SUBJECTIVE:  wife at bedside. Looks fatigued due to 2 hour sleep last nite Encouraged to eat Good urine output. No dialysis planned for the weekend. Foley removed 11/09  VITALS:  Blood pressure 130/88, pulse (!) 115, temperature 98 F (36.7 C), resp. rate 18, height 5\' 10"  (1.778 m), weight 97.3 kg, SpO2 94%.  PHYSICAL EXAMINATION:   GENERAL:  77 y.o.-year-old patient with no acute distress. obese LUNGS: Normal breath sounds bilaterally, no wheezing CARDIOVASCULAR: S1, S2 normal. No murmur   ABDOMEN: Soft, nontender, nondistended. Bowel sounds present.  EXTREMITIES:left knee brace + NEUROLOGIC: nonfocal  patient is alert and awake  LABORATORY PANEL:  CBC Recent Labs  Lab 09/30/23 0441  WBC 8.4  HGB 8.1*  HCT 24.1*  PLT 316    Chemistries  Recent Labs  Lab 09/25/23 0328 09/26/23 0536 10/01/23 0410 10/01/23 0500  NA 134*   < >  --  137  K 3.1*   < >  --  3.8  CL 93*   < >  --  97*  CO2 31   < >  --  28  GLUCOSE 99   < >  --  123*  BUN 55*   < >  --  68*  CREATININE 6.87*   < >  --  5.66*  CALCIUM 7.6*   < >  --  8.6*  MG  --    < > 2.0  --   AST 104*  --   --   --   ALT 37  --   --   --   ALKPHOS 35*  --   --   --   BILITOT 1.3*  --   --   --    < > = values in this interval not displayed.     77 year old with history of CHF, A-fib, CAD, HTN, CKD, BPH presenting with left lower extremity trauma Anteroapical and then falling on the floor.  Apparently was trapped for about 2 hours until family found him.  Upon admission CT head, cervical spine negative, CT chest abdomen pelvis showed acute T12 compression fracture, CT of lower extremity showed asymmetric enlargement of muscle on the left.  Eventually found to be in severe rhabdomyolysis causing acute kidney injury.  Patient started on dialysis.  Over the producing urine  creatinine slightly trending up including BUN.     Assessment & Plan:    Acute Oliguric kidney injury superimposed on stage 3b chronic kidney disease (HCC) Secondary to Traumatic Rhabdomyolysis, improving --Unfortunately renal failure/ATN secondary to rhabdomyolysis.  -- HD sessions per nephrology team.  -- Oligouric with elevated creatinine and BUN.--making good UOP. HD on hold for now -- Midodrine for soft blood pressures -Right IJ HD nontunneled catheter --baseline creat 2.3 --creat for now plateued to 6.0    --ok to remove foley per Nephrology   Atrial fibrillation with RVR (HCC) Continue amiodarone, Toprol-XL.  Eliquis. --prn IV metoprolol   Scrotal edema - Elevate   Hypokalemia - Managed with HD --k 3.4   Acute on chronic diastolic CHF (congestive heart failure) (HCC) Echocardiogram with preserved LV systolic function with EF 60- to 65%, mild LVH, RV with normal systolic function, no significant valvular disease.  --On metoprolol.  Avoiding ARB/ARNI due to AKI   Essential hypertension --On metoprolol.  IV as needed.   Closed  left femoral condyle avulsion fracture (HCC)--cont brace T12 compression fracture.    Coronary artery disease status post PCI September 2024 --PCI at [facility]  Currently on Plavix, Eliquis   Anemia of chronic disease Follow up hgb is 9.7     Malnutrition of moderate degree --Continue nutritional supplements.    DVT prophylaxis: Eliquis Code Status: Full code Family Communication: Spouse at bedside Status is: Inpatient Remains inpatient appropriate because:severity of illness. CIR following medical progression    PT/OT--CIR  TOTAL TIME TAKING CARE OF THIS PATIENT: 35 minutes.  >50% time spent on counselling and coordination of care  Note: This dictation was prepared with Dragon dictation along with smaller phrase technology. Any transcriptional errors that result from this process are unintentional.  Enedina Finner M.D    [facility]   CC: Primary care physician; Elizabeth Palau, FNP

## 2023-10-01 NOTE — Plan of Care (Signed)
  Problem: Clinical Measurements: Goal: Diagnostic test results will improve Outcome: Progressing   Problem: Nutrition: Goal: Adequate nutrition will be maintained Outcome: Progressing   

## 2023-10-01 NOTE — Plan of Care (Signed)
  Problem: Education: Goal: Knowledge of General Education information will improve Description: Including pain rating scale, medication(s)/side effects and non-pharmacologic comfort measures Outcome: Progressing   Problem: Health Behavior/Discharge Planning: Goal: Ability to manage health-related needs will improve Outcome: Progressing   Problem: Clinical Measurements: Goal: Ability to maintain clinical measurements within normal limits will improve 10/01/2023 2333 by Nicole Cella, RN Outcome: Progressing 10/01/2023 2331 by Nicole Cella, RN Outcome: Progressing Goal: Will remain free from infection 10/01/2023 2333 by Nicole Cella, RN Outcome: Progressing 10/01/2023 2331 by Nicole Cella, RN Outcome: Progressing Goal: Diagnostic test results will improve 10/01/2023 2333 by Nicole Cella, RN Outcome: Progressing 10/01/2023 2331 by Nicole Cella, RN Outcome: Progressing Goal: Respiratory complications will improve 10/01/2023 2333 by Nicole Cella, RN Outcome: Progressing 10/01/2023 2331 by Nicole Cella, RN Outcome: Progressing   Problem: Activity: Goal: Risk for activity intolerance will decrease 10/01/2023 2333 by Nicole Cella, RN Outcome: Progressing 10/01/2023 2331 by Nicole Cella, RN Outcome: Progressing   Problem: Nutrition: Goal: Adequate nutrition will be maintained 10/01/2023 2333 by Nicole Cella, RN Outcome: Progressing 10/01/2023 2331 by Nicole Cella, RN Outcome: Progressing
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
## 2023-10-01 NOTE — Plan of Care (Signed)

## 2023-10-02 ENCOUNTER — Encounter (HOSPITAL_COMMUNITY): Payer: Medicare PPO

## 2023-10-02 DIAGNOSIS — N179 Acute kidney failure, unspecified: Secondary | ICD-10-CM | POA: Diagnosis not present

## 2023-10-02 DIAGNOSIS — N1832 Chronic kidney disease, stage 3b: Secondary | ICD-10-CM | POA: Diagnosis not present

## 2023-10-02 DIAGNOSIS — I4891 Unspecified atrial fibrillation: Secondary | ICD-10-CM | POA: Diagnosis not present

## 2023-10-02 LAB — RENAL FUNCTION PANEL
Albumin: 2 g/dL — ABNORMAL LOW (ref 3.5–5.0)
Anion gap: 9 (ref 5–15)
BUN: 63 mg/dL — ABNORMAL HIGH (ref 8–23)
CO2: 29 mmol/L (ref 22–32)
Calcium: 8.5 mg/dL — ABNORMAL LOW (ref 8.9–10.3)
Chloride: 96 mmol/L — ABNORMAL LOW (ref 98–111)
Creatinine, Ser: 4.98 mg/dL — ABNORMAL HIGH (ref 0.61–1.24)
GFR, Estimated: 11 mL/min — ABNORMAL LOW (ref >60)
Glucose, Bld: 115 mg/dL — ABNORMAL HIGH (ref 70–99)
Phosphorus: 5.4 mg/dL — ABNORMAL HIGH (ref 2.5–4.6)
Potassium: 3.5 mmol/L (ref 3.5–5.1)
Sodium: 134 mmol/L — ABNORMAL LOW (ref 135–145)

## 2023-10-02 LAB — MAGNESIUM: Magnesium: 1.9 mg/dL (ref 1.7–2.4)

## 2023-10-02 MED ORDER — AMIODARONE HCL 200 MG PO TABS
200.0000 mg | ORAL_TABLET | Freq: Two times a day (BID) | ORAL | Status: AC
  Administered 2023-10-02 – 2023-10-09 (×14): 200 mg via ORAL
  Filled 2023-10-02 (×14): qty 1

## 2023-10-02 MED ORDER — ENSURE ENLIVE PO LIQD
237.0000 mL | Freq: Two times a day (BID) | ORAL | Status: DC
  Administered 2023-10-02 – 2023-10-03 (×2): 237 mL via ORAL

## 2023-10-02 MED ORDER — SODIUM CHLORIDE 0.9 % IV BOLUS
250.0000 mL | Freq: Once | INTRAVENOUS | Status: AC
  Administered 2023-10-02: 250 mL via INTRAVENOUS
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
## 2023-10-02 NOTE — Care Management Important Message (Signed)
Important Message  Patient Details  Name: Jorge West MRN: [id number] Date of Birth: July 23, 1946   Important Message Given:  Yes - Medicare IM     Sherilyn Banker 10/02/2023, 2:54 PM
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
## 2023-10-02 NOTE — Progress Notes (Signed)
Nutrition Follow-up  DOCUMENTATION CODES:   Non-severe (moderate) malnutrition in context of chronic illness  INTERVENTION:  48 hour calorie count initiated Consider Cortrak placement for supplemental nutrition support; discussed with MD Continue Regular diet to provide widest variety of menu options  Increase Ensure Enlive po to BID, each supplement provides 350 kcal and 20 grams of protein. Discontinue Boost Breeze po TID, each supplement provides 250 kcal and 9 grams of protein Continue Magic cup TID with meals, each supplement provides 290 kcal and 9 grams of protein  NUTRITION DIAGNOSIS:   Moderate Malnutrition related to chronic illness (heart failure, afib) as evidenced by mild fat depletion, mild muscle depletion. - remains applicable  GOAL:   Patient will meet greater than or equal to 90% of their needs - goal unmet  MONITOR:   PO intake, Labs, Weight trends, Skin  REASON FOR ASSESSMENT:   Consult Assessment of nutrition requirement/status  ASSESSMENT:   Pt admitted with L femoral condyle avulsion fracture, T12 anterior wedge compression fracture sustained while he was changing a tire on his car and it fell on his leg. PMH significant for heart failure, afib, CAD, paroxysmal afib, HTN, CKD and BPH.  11/1 [facility] placement 11/2 1st iHD session- net UF 1L  11/5 2nd iHD  No acute surgical interventions for fractures.  Remains WBAT. Awaiting insurance auth on CIR placement.   Spoke with pt's wife at bedside. Pt sleeping during visit. Pt's wife mentions that he worked with therapy earlier in the morning.  She reports that on average throughout admission, he has eaten very little. Saturday she felt that he was rallying and he ate the best this day however was still limited. She mentions that it took him the whole day to consume 50% of an Ensure. Yesterday she reports that he slept all day and did not have any PO intake. Noted multiple Ensure and Boost on windowsill.   He  has enjoyed the magic cups to which she feels that this is because it takes little effort to consume.  This morning he ate 1 strip of bacon, 50% of the pancakes, 1 magic cup and 50% of orange juice (576 kcal and 15g protein).    Meal completions: 11/7: 10% breakfast, 10% lunch, 0% dinner 11/8: 75% breakfast  Discussed importance of adequate nutritional intake to support healing and energy needs. Pt's wife is understanding and continues to encourage PO intake as best as she can. Reached out to MD regarding recommendation for Cortrak placement. Pending calorie count results.   Medications:Vitamin D3 1000 units, rena-vit, Vitamin B12 daily  Labs: sodium 134, BUN 63, Cr 4.98, phos 5.4, GFR 11  UOP: x24 + x3 hours  Diet Order:   Diet Order             Diet regular Fluid consistency: Thin  Diet effective now                   EDUCATION NEEDS:   Education needs have been addressed  Skin:  Skin Assessment: Reviewed RN Assessment  Last BM:  11/9  Height:   Ht Readings from Last 1 Encounters:  09/19/23 5\' 10"  (1.778 m)    Weight:   Wt Readings from Last 1 Encounters:  10/02/23 97.8 kg    Ideal Body Weight:  75.5 kg  BMI:  Body mass index is 30.94 kg/m.  Estimated Nutritional Needs:   Kcal:  1800-2000  Protein:  95-110g  Fluid:  1L + UOP  Allie  Candies Palm, RDN, LDN Clinical Nutrition

## 2023-10-02 NOTE — Progress Notes (Signed)
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 Ella Jubilee MD   Jorge West is an 77 y.o. male  with a PMHx significant for HF, atrial fibrillation, coronary artery disease, paroxysmal atrial fibrillation, HTN, CKD3B, and BPH who presented on 09/19/23 with trauma. He was changing a tire on is car and it fell on his leg. Apparently, he was trapped for about 2 hrs until his family found him and called EMS. Ultimately he was admitted for L femoral condyle avulsion fracture, and AoCKD3B.   Subjective: This AM when sat up to work with PT BP was low in 80s and HR A fib 120s - rec'd 0.5L isotonic bolus and BP improved some. UOP yest and at noon already today.   Pt now tired from PT and sleeping but wife bedside.  He's overall doing better and ate breakfast this AM.   Trend in Creatinine: Creatinine, Ser  Date/Time Value Ref Range Status  10/02/2023 05:15 AM 4.98 (H) 0.61 - 1.24 mg/dL Final  16/08/9603 54:09 AM 5.66 (H) 0.61 - 1.24 mg/dL Final  81/19/1478 29:56 AM 6.34 (H) 0.61 - 1.24 mg/dL Final  21/30/8657 84:69 AM 6.48 (H) 0.61 - 1.24 mg/dL Final  62/95/2841 32:44 AM 6.17 (H) 0.61 - 1.24 mg/dL Final  11/23/7251 66:44 AM 5.93 (H) 0.61 - 1.24 mg/dL Final  03/47/4259 56:38 AM 7.23 (H) 0.61 - 1.24 mg/dL Final  75/64/3329 51:88 AM 6.87 (H) 0.61 - 1.24 mg/dL Final  41/66/0630 16:01 AM 6.04 (H) 0.61 - 1.24 mg/dL Final  09/32/3557 32:20 AM 7.12 (H) 0.61 - 1.24 mg/dL Final  25/42/7062 37:62 AM 6.03 (H) 0.61 - 1.24 mg/dL Final  83/15/1761 60:73 AM 4.71 (H) 0.61 - 1.24 mg/dL Final  71/04/2693 85:46 AM 3.21 (H) 0.61 - 1.24 mg/dL Final  27/01/5008 38:18 PM 2.30 (H) 0.61 - 1.24 mg/dL Final  29/93/7169 67:89 PM 2.36 (H) 0.61 - 1.24 mg/dL Final  38/08/1750 02:58 AM 1.77 (H) 0.61 - 1.24 mg/dL Final  52/77/8242 35:36 AM 2.02 (H) 0.61 - 1.24 mg/dL Final  14/43/1540 08:67 PM 2.07 (H) 0.61 - 1.24 mg/dL Final  61/95/0932 67:12 AM 1.45 (H) 0.61 - 1.24 mg/dL Final  45/80/9983 38:25  AM 1.22 0.61 - 1.24 mg/dL Final  05/39/7673 41:93 PM 1.20 0.50 - 1.35 mg/dL Final  79/12/4095 35:32 PM 1.01 0.50 - 1.35 mg/dL Final    PMH:   Past Medical History:  Diagnosis Date   Atrial fibrillation (HCC)    Had an ablation done   Atypical mole 06/14/2000   moderate/marked on mid back Nino Glow)   Atypical mole 11/29/2001   slight/moderate on right shoulder Nino Glow)   Atypical mole 11/29/2001   moderate on right lateral abdomen Nino Glow)   Atypical mole 11/29/2001   moderate on right forearm Nino Glow)   Atypical mole 01/11/2007   moderate on lower mid back   Atypical mole 07/14/2008   SK and atypical solar lentigo on left jawline (widershave)   Basal cell carcinoma 07/29/1996   back left lower ear - CX3+excision   Basal cell carcinoma 09/22/2004   basosquamous on right tip of nose (MOHs)   Basal cell carcinoma 10/20/2005   superficial on upper center back - CX3+5FU   Basal cell carcinoma 10/20/2011   right upper back - tx p bx   Basal cell carcinoma 02/07/2018   superficial/nodular on left upper arm - CX3+cautery+5FU   Basal cell carcinoma 10/23/2018   infiltrative on left sideburn [facility])   Benign prostate hyperplasia  Cataract    left and removed   Depression    Depression    Diverticulosis    Erectile dysfunction    Hyperlipidemia    Hypertension    Impaired fasting glucose    Nocturia    Personal history of colonic polyps 04/19/2005   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   Left Temporal Scalp (well diff)   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   left post auricular - clear at visit on 12/28/2001   Squamous cell carcinoma of skin 10/08/2003   well differentiated below outer left eye - MOHs   Squamous cell carcinoma of skin 06/02/2008   well differentiated behind left ear   Squamous cell carcinoma of skin 10/20/2011   KA on left elbow   Squamous cell carcinoma of skin 03/02/2015   well differentiated on right outer cheek -  CX3+excision   Squamous cell carcinoma of skin 03/01/2017   well differentiated on left forearm - tx p bx   Squamous cell carcinoma of skin 03/07/2018   moderately differentiated on left jawline - CX3+excision    PSH:   Past Surgical History:  Procedure Laterality Date   CATARACT EXTRACTION     COLONOSCOPY     IR FLUORO GUIDE CV LINE RIGHT  09/22/2023   IR US GUIDE VASC ACCESS RIGHT  09/22/2023   OPEN REDUCTION INTERNAL FIXATION (ORIF) METACARPAL  09/24/2012   Procedure: OPEN REDUCTION INTERNAL FIXATION (ORIF) METACARPAL;  Surgeon: Sharma Covert, MD;  Location: [facility] OR;  Service: Orthopedics;  Laterality: Left;  and proximal phalanges.   surgery for skin cancer      Allergies:  Allergies  Allergen Reactions   Terazosin Hcl     Dizziness and hypotension    Medications:   Prior to Admission medications   Medication Sig Start Date End Date Taking? Authorizing Provider  acetaminophen (TYLENOL) 325 MG tablet Take 2 tablets (650 mg total) by mouth every 6 (six) hours as needed for mild pain (or Fever >/= 101). 06/03/19  Yes Emokpae, Courage, MD  ALPRAZolam (XANAX) 0.5 MG tablet Take 0.5 mg by mouth 2 (two) times daily as needed for anxiety.   Yes [provider]  amLODipine (NORVASC) 10 MG tablet Take 10 mg by mouth daily. 08/19/23  Yes [provider]  apixaban (ELIQUIS) 5 MG TABS tablet Take 1 tablet (5 mg total) by mouth 2 (two) times daily. Blood thinner for stroke prevention 06/03/19  Yes Emokpae, Courage, MD  brimonidine-timolol (COMBIGAN) 0.2-0.5 % ophthalmic solution Place 1 drop into the left eye daily. 02/29/16  Yes [provider]  buPROPion (WELLBUTRIN XL) 300 MG 24 hr tablet Take 300 mg by mouth daily.   Yes [provider]  Cholecalciferol (VITAMIN D3) 3000 UNITS TABS Take 1 tablet by mouth daily.    Yes [provider]  clopidogrel (PLAVIX) 75 MG tablet Take 75 mg by mouth daily.   Yes [provider]  cyanocobalamin (VITAMIN  B12) 100 MCG tablet Take 100 mcg by mouth daily.   Yes [provider]  escitalopram (LEXAPRO) 10 MG tablet Take 10 mg by mouth daily.   Yes [provider]  metoprolol succinate (TOPROL-XL) 25 MG 24 hr tablet Take 25 mg by mouth daily. 08/22/23  Yes [provider]  nitroGLYCERIN (NITROSTAT) 0.4 MG SL tablet Place 0.4 mg under the tongue every 5 (five) minutes as needed for chest pain.   Yes [provider]  Omega-3 Fatty Acids (FISH OIL PO) Take  1 capsule by mouth daily.    Yes [provider]  pantoprazole (PROTONIX) 40 MG tablet Take 40 mg by mouth daily.   Yes [provider]  rosuvastatin (CRESTOR) 40 MG tablet Take 40 mg by mouth daily.   Yes [provider]  sacubitril-valsartan (ENTRESTO) 24-26 MG Take 1 tablet by mouth daily.   Yes [provider]  vitamin C (ASCORBIC ACID) 500 MG tablet Take 1 tablet (500 mg total) by mouth daily. 09/25/12  Yes Bradly Bienenstock, MD    Inpatient medications:  amiodarone  200 mg Oral Daily   apixaban  5 mg Oral BID   brimonidine  1 drop Left Eye Daily   And   timolol  1 drop Left Eye Daily   buPROPion  300 mg Oral Daily   Chlorhexidine Gluconate Cloth  6 each Topical Daily   cholecalciferol  1,000 Units Oral Daily   clopidogrel  75 mg Oral Daily   escitalopram  10 mg Oral Daily   feeding supplement  1 Container Oral Q24H   feeding supplement  237 mL Oral Q24H   metoprolol succinate  25 mg Oral Daily   midodrine  10 mg Oral TID WC   multivitamin  1 tablet Oral QHS   neomycin-bacitracin-polymyxin   Topical BID   sodium chloride flush  3 mL Intravenous Q12H   cyanocobalamin  100 mcg Oral Daily    Discontinued Meds:   Medications Discontinued During This Encounter  Medication Reason   methocarbamol (ROBAXIN) 500 MG tablet No longer needed (for PRN medications)   metoprolol tartrate (LOPRESSOR) 25 MG tablet Discontinued by provider   pravastatin (PRAVACHOL) 20 MG tablet  Discontinued by provider   amLODipine (NORVASC) 5 MG tablet Dose change   clopidogrel (PLAVIX) tablet 75 mg    metoprolol tartrate (LOPRESSOR) tablet 12.5 mg    dextrose 5 % in lactated ringers infusion    rosuvastatin (CRESTOR) tablet 40 mg    dextrose 5 % in lactated ringers infusion    midodrine (PROAMATINE) tablet 5 mg    HYDROmorphone (DILAUDID) injection 0.5 mg    heparin ADULT infusion 100 units/mL (25000 units/219mL) Change in therapy   0.9 %  sodium chloride infusion    HYDROmorphone (DILAUDID) injection 0.5-1 mg    apixaban (ELIQUIS) tablet 5 mg    clopidogrel (PLAVIX) tablet 75 mg    0.9 %  sodium chloride infusion    brimonidine-timolol (COMBIGAN) 0.2-0.5 % ophthalmic solution 1 drop P&T Policy: Therapeutic Substitute   amiodarone (NEXTERONE PREMIX) 360-4.14 MG/200ML-% (1.8 mg/mL) IV infusion    amiodarone (NEXTERONE PREMIX) 360-4.14 MG/200ML-% (1.8 mg/mL) IV infusion    heparin injection 1,000 Units Patient Transfer   anticoagulant sodium citrate solution 5 mL Patient Transfer   alteplase (CATHFLO ACTIVASE) injection 2 mg Patient Transfer   sodium bicarbonate 150 mEq in sterile water 1,150 mL infusion    senna-docusate (Senokot-S) tablet 1 tablet    0.9 %  sodium chloride infusion (Manually program via Guardrails IV Fluids)    0.9 %  sodium chloride infusion (Manually program via Guardrails IV Fluids)    hydrALAZINE (APRESOLINE) injection 10 mg     Social History:  reports that he has never smoked. He has quit using smokeless tobacco. He reports that he does not currently use alcohol. He reports that he does not use drugs.  Family History:   Family History  Problem Relation Age of Onset   Cancer Other        family history  Colon cancer Neg Hx    Colon polyps Neg Hx    Rectal cancer Neg Hx    Stomach cancer Neg Hx     Pertinent items noted in HPI and remainder of comprehensive ROS otherwise negative. Weight change: 0.5 kg  Intake/Output Summary (Last 24  hours) at 10/02/2023 1135 Last data filed at 10/02/2023 0845 Gross per 24 hour  Intake 240 ml  Output 1450 ml  Net -1210 ml   BP 102/65 (BP Location: Left Arm)   Pulse (!) 115   Temp 98.4 F (36.9 C) (Oral)   Resp 20   Ht 5\' 10"  (1.778 m)   Wt 97.8 kg   SpO2 93%   BMI 30.94 kg/m  Vitals:   10/02/23 1034 10/02/23 1039 10/02/23 1047 10/02/23 1056  BP: (!) 77/56 (!) 86/60 93/71 102/65  Pulse:  (!) 119 (!) 119 (!) 115  Resp:   18 20  Temp:   98.4 F (36.9 C)   TempSrc:   Oral   SpO2:  92% 94% 93%  Weight:      Height:         Physical Exam: GEN: Lying in bed, sleeping ENT: no nasal discharge, mmm EYES: no scleral icterus, eomi CV: Tachycardic, a fib PULM: no iwob, bilateral chest rise ABD: NABS, non-distended SKIN: Abrasions covered with gauze, otherwise no rashes or jaundice EXT: no edema, warm and well perfused; brace on LLE   Labs: Basic Metabolic Panel: Recent Labs  Lab 09/26/23 0536 09/27/23 0422 09/28/23 0520 09/29/23 0510 09/30/23 0441 10/01/23 0500 10/02/23 0515  NA 134* 134* 135 135 138 137 134*  K 3.2* 3.3* 3.6 3.4* 3.8 3.8 3.5  CL 93* 97* 97* 97* 99 97* 96*  CO2 30 29 29 29 28 28 29   GLUCOSE 95 98 99 98 89 123* 115*  BUN 65* 53* 61* 69* 69* 68* 63*  CREATININE 7.23* 5.93* 6.17* 6.48* 6.34* 5.66* 4.98*  ALBUMIN 1.8* 1.7*  --   --   --  2.2* 2.0*  CALCIUM 7.9* 7.7* 7.9* 8.0* 8.6* 8.6* 8.5*  PHOS 5.4* 4.7*  --   --   --  5.8* 5.4*   Liver Function Tests: Recent Labs  Lab 09/27/23 0422 10/01/23 0500 10/02/23 0515  ALBUMIN 1.7* 2.2* 2.0*   No results for input(s): "LIPASE", "AMYLASE" in the last 168 hours. No results for input(s): "AMMONIA" in the last 168 hours. CBC: Recent Labs  Lab 09/27/23 0422 09/28/23 0520 09/29/23 0510 09/30/23 0441  WBC 7.3 7.3 8.2 8.4  HGB 8.9* 9.2* 8.4* 8.1*  HCT 26.5* 27.2* 24.5* 24.1*  MCV 86.6 86.6 87.5 89.3  PLT 203 223 279 316   PT/INR: @LABRCNTIP (inr:5) Cardiac Enzymes: ) Recent Labs  Lab  09/26/23 0536 09/27/23 0422 09/28/23 0520  CKTOTAL 1,692* 1,056* 635*   CBG: No results for input(s): "GLUCAP" in the last 168 hours.  Iron Studies: No results for input(s): "IRON", "TIBC", "TRANSFERRIN", "FERRITIN" in the last 168 hours.  Xrays/Other Studies: No results found.   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 Urine output seems to be picking up and creatinine now improving.  Baseline creatinine 2.3.  Cr down to 5 from 6.5 on 11/8 but in setting of hypotension today will hold on removing HD cath for today.  Traumatic rhabdomyolysis, CK levels over all improved Hypokalemia Replacement as needed Acute hypoxemic respiratory failure,  overall stable on 2L [place]  Permanent atrial fibrillation with RVR, followed by cardiology, on amiodarone.  Apixaban 5 mg BID.  With BP issues and HR > 100 today primary may reinvolve cardiology. CAD with PCI placed about 2 months ago, per cardiology. Plavix 75 mg daily.   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  Closed left femoral condyle avulsion fracture, tibial plateau fracture, and brace currently, orthopedics following. Hypertension: having hypotension this AM - isotonic bolus given with some improvement.  On midodrine standing already. Holding Entresto for AKI. A fib mgmt per above.  Anemia: Continue under transfuse as needed.  Multifactorial Medication Issues; Preferred narcotic agents for pain control are hydromorphone, fentanyl, and methadone. Morphine should not be used.  Baclofen should be avoided Avoid oral sodium phosphate and magnesium citrate based laxatives / bowel preps

## 2023-10-02 NOTE — Progress Notes (Signed)
Inpatient Rehab Admissions Coordinator:   Continue to await insurance decision.  PT/OT note slow progress with therapy.   Estill Dooms, PT, DPT Admissions Coordinator [phone] 10/02/23  10:53 AM

## 2023-10-03 ENCOUNTER — Inpatient Hospital Stay (HOSPITAL_COMMUNITY): Payer: Medicare PPO

## 2023-10-03 DIAGNOSIS — N179 Acute kidney failure, unspecified: Secondary | ICD-10-CM | POA: Diagnosis not present

## 2023-10-03 DIAGNOSIS — N1832 Chronic kidney disease, stage 3b: Secondary | ICD-10-CM | POA: Diagnosis not present

## 2023-10-03 LAB — RENAL FUNCTION PANEL
Albumin: 2 g/dL — ABNORMAL LOW (ref 3.5–5.0)
Anion gap: 11 (ref 5–15)
BUN: 71 mg/dL — ABNORMAL HIGH (ref 8–23)
CO2: 29 mmol/L (ref 22–32)
Calcium: 8.6 mg/dL — ABNORMAL LOW (ref 8.9–10.3)
Chloride: 95 mmol/L — ABNORMAL LOW (ref 98–111)
Creatinine, Ser: 4.69 mg/dL — ABNORMAL HIGH (ref 0.61–1.24)
GFR, Estimated: 12 mL/min — ABNORMAL LOW (ref >60)
Glucose, Bld: 111 mg/dL — ABNORMAL HIGH (ref 70–99)
Phosphorus: 5 mg/dL — ABNORMAL HIGH (ref 2.5–4.6)
Potassium: 3.6 mmol/L (ref 3.5–5.1)
Sodium: 135 mmol/L (ref 135–145)

## 2023-10-03 LAB — URINALYSIS, ROUTINE W REFLEX MICROSCOPIC
Bilirubin Urine: NEGATIVE
Glucose, UA: NEGATIVE mg/dL
Ketones, ur: NEGATIVE mg/dL
Nitrite: NEGATIVE
Protein, ur: 30 mg/dL — AB
Specific Gravity, Urine: 1.015 (ref 1.005–1.030)
pH: 5 (ref 5.0–8.0)

## 2023-10-03 LAB — CBC
HCT: 25.2 % — ABNORMAL LOW (ref 39.0–52.0)
Hemoglobin: 8.5 g/dL — ABNORMAL LOW (ref 13.0–17.0)
MCH: 30.2 pg (ref 26.0–34.0)
MCHC: 33.7 g/dL (ref 30.0–36.0)
MCV: 89.7 fL (ref 80.0–100.0)
Platelets: 374 10*3/uL (ref 150–400)
RBC: 2.81 MIL/uL — ABNORMAL LOW (ref 4.22–5.81)
RDW: 13.4 % (ref 11.5–15.5)
WBC: 20.1 10*3/uL — ABNORMAL HIGH (ref 4.0–10.5)
nRBC: 0 % (ref 0.0–0.2)

## 2023-10-03 LAB — LACTIC ACID, PLASMA: Lactic Acid, Venous: 0.9 mmol/L (ref 0.5–1.9)

## 2023-10-03 LAB — HEPATIC FUNCTION PANEL
ALT: 19 U/L (ref 0–44)
AST: 18 U/L (ref 15–41)
Albumin: 2 g/dL — ABNORMAL LOW (ref 3.5–5.0)
Alkaline Phosphatase: 42 U/L (ref 38–126)
Bilirubin, Direct: 0.3 mg/dL — ABNORMAL HIGH (ref 0.0–0.2)
Indirect Bilirubin: 0.6 mg/dL (ref 0.3–0.9)
Total Bilirubin: 0.9 mg/dL (ref <1.2)
Total Protein: 5.3 g/dL — ABNORMAL LOW (ref 6.5–8.1)

## 2023-10-03 LAB — RESP PANEL BY RT-PCR (RSV, FLU A&B, COVID)  RVPGX2
Influenza A by PCR: NEGATIVE
Influenza B by PCR: NEGATIVE
Resp Syncytial Virus by PCR: NEGATIVE
SARS Coronavirus 2 by RT PCR: NEGATIVE

## 2023-10-03 LAB — MAGNESIUM: Magnesium: 1.8 mg/dL (ref 1.7–2.4)

## 2023-10-03 MED ORDER — ACETAMINOPHEN 500 MG PO TABS
1000.0000 mg | ORAL_TABLET | Freq: Four times a day (QID) | ORAL | Status: DC | PRN
  Administered 2023-10-03 – 2023-10-28 (×8): 1000 mg via ORAL
  Filled 2023-10-03 (×8): qty 2

## 2023-10-03 MED ORDER — SODIUM CHLORIDE 0.9 % IV SOLN
2.0000 g | INTRAVENOUS | Status: DC
Start: 2023-10-03 — End: 2023-10-07
  Administered 2023-10-03 – 2023-10-07 (×5): 2 g via INTRAVENOUS
  Filled 2023-10-03 (×5): qty 20

## 2023-10-03 MED ORDER — METRONIDAZOLE 500 MG PO TABS
500.0000 mg | ORAL_TABLET | Freq: Two times a day (BID) | ORAL | Status: DC
  Administered 2023-10-03 – 2023-10-04 (×4): 500 mg via ORAL
  Filled 2023-10-03 (×4): qty 1

## 2023-10-03 MED ORDER — SODIUM CHLORIDE 0.9 % IV BOLUS
500.0000 mL | Freq: Once | INTRAVENOUS | Status: AC
  Administered 2023-10-03: 500 mL via INTRAVENOUS

## 2023-10-03 MED ORDER — BOOST / RESOURCE BREEZE PO LIQD CUSTOM
1.0000 | Freq: Three times a day (TID) | ORAL | Status: DC
  Administered 2023-10-03 – 2023-10-10 (×12): 1 via ORAL

## 2023-10-03 NOTE — Progress Notes (Signed)
OT Cancellation Note  Patient Details Name: Jorge West MRN: [id number] DOB: 06-08-1946   Cancelled Treatment:    Reason Eval/Treat Not Completed: Other (comment) Pt sleeping upon therapy arrival. Family member visiting at bedside. Reports fever this AM and on liquid diet today. Waiting to see if it's his gallbladder. Will let patient continue to sleep and rest today and follow up with pt tomorrow to attempt OT treatment. Family member thanked OT and verbalized no additional needs.   Limmie Patricia, OTR/L,CBIS  Supplemental OT - [facility] Secure Chat Preferred   10/03/2023, 3:28 PM

## 2023-10-03 NOTE — Progress Notes (Signed)
Calorie Count Note  48 hour calorie count ordered.  Diet: Regular diet, thin liquids Supplements: Ensure Enlive BID, Magic cup TID  Breakfast: 286 kcal and 6g protein Lunch: 200 kcal and 2g protein Dinner: 165 kcal and 3 g protein Supplements: 786 kcal, 36g  Total intake: 1437 kcal (80% of minimum estimated needs)  47 protein (49% of minimum estimated needs)  Pt placed back on clear liquids for upcoming HIDA scan tomorrow due to new onset of RUQ pain, suspect cholecystitis. Pt will be NPO at midnight and most likely tomorrow as well. Messaged MD about possibly of Cortrak placement duet to fluctuating intake and diet downgraded to clears for upcoming HIDA scan. Question what diet will be like tomorrow.   Nutrition Dx: Moderate Malnutrition related to chronic illness (heart failure, afib) as evidenced by mild fat depletion, mild muscle depletion. - remains applicable  Goal: Patient will meet greater than or equal to 90% of their needs - Progressing, not meeting currently   Intervention:  - D/C Ensure Enlive and Magic cup due to clear diet  - D/C Calorie count until after HIDA scan tomorrow  - Provide Boost Breeze PO TID, each supplement provides 250 kcal and 9 grams of protein  - Consider Cortrak placement for supplemental nutrition support   - Diet advancement per MD  Elliot Dally, RD Registered Dietitian  See Amion for more information

## 2023-10-03 NOTE — Progress Notes (Signed)
Notified by RN of slight worsening HR trend into 130s Afib, and febrile to 38.1, WBC 20 this morning. On interview he reports new onset of RUQ pain, nausea, no vomiting or diarrhea. Also has cough and some congestion. On exam tachycardic, irregular, lung fields clear, abd with mod to severe RUQ tenderness with some voluntary guarding, no rebound, or rigidity. + murphys. Data with rising WBC to 20, Cr 4.69 improved and got small IVF yesterday.   Assessment:  Developing sepsis, source suspected intraabd/ cholecystitis  Afib with RVR  - Check blood cultures, lactate  - Give 500 cc IVF to start given his AKI  - Given high suspicion for intraabd infection, start Ceftriaxone and Flagyl after blood cultures drawn.  - Check RUQ [place], LFT and additional eval including CXR, COVID, UA  - Higher HR trend likely reactive from above, in addition to his normally difficult rate control. Would hydrate, treat fever first before escalating metoprolol / amiodarone   Dolly Rias, MD  [facility]

## 2023-10-03 NOTE — Progress Notes (Signed)
PROGRESS NOTE ELVON MCCULLOH  AVW:[id number] DOB: 07/01/1946 DOA: 09/19/2023 PCP: Elizabeth Palau, FNP  Brief Narrative/Hospital Course: 77 year old with history of CHF, A-fib, CAD, HTN, CKD, BPH presenting with left lower extremity trauma Anteroapical and then falling on the floor. Apparently was trapped for about 2 hours until family found him. Upon admission CT head, cervical spine negative, CT chest abdomen pelvis showed acute T12 compression fracture, CT of lower extremity showed asymmetric enlargement of muscle on the left.  Patient was admitted for left femoral condyle avulsion fracture, severe rhabdomyolysis and AKI on CKD. Patient started on dialysis.  Nephrology is following closely     Subjective: Patient seen and examined this morning  Overnight he was febrile up to 100.5 Tachycardic, labs shows leukocytosis has bumped to 20.1 9 was obtained with WBC 21 days 50 bacteria.  Leukocytes moderate, BMP shows creatinine slightly better at 4.6 Wife at the bedside when he was seen with complaints of right upper quadrant abdominal pain   Assessment and Plan: Principal Problem:   Acute kidney injury superimposed on stage 3b chronic kidney disease (HCC) Active Problems:   Atrial fibrillation with RVR (HCC)   Essential hypertension   Closed left femoral condyle avulsion fracture (HCC)   Coronary artery disease   Anemia of chronic disease   Malnutrition of moderate degree   Persistent atrial fibrillation (HCC)  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  Right internal jugular tunneled HD cath 11/1-still in place with fever overnight-blood culture has been ordered. Monitor renal function closely and output-HD remains on hold hopefully can discontinue central line soon Recent Labs    09/24/23 0808 09/25/23 0328 09/26/23 0536 09/27/23 0422 09/28/23 0520 09/29/23 0510 09/30/23 0441 10/01/23 0500 10/02/23 0515  10/03/23 0422  BUN 44* 55* 65* 53* 61* 69* 69* 68* 63* 71*  CREATININE 6.04* 6.87* 7.23* 5.93* 6.17* 6.48* 6.34* 5.66* 4.98* 4.69*  CO2 28 31 30 29 29 29 28 28 29  29  K 3.4* 3.1* 3.2* 3.3* 3.6 3.4* 3.8 3.8 3.5 3.6   Intake/Output Summary (Last 24 hours) at 10/03/2023 0946 Last data filed at 10/03/2023 8295 Gross per 24 hour  Intake 1340 ml  Output 600 ml  Net 740 ml      Traumatic rhabdomyolysis: CK has improved overall continue oral hydration  Fever episode 11/2 Leukocytosis Right upper quadrant abdominal pain Pyuria: On empiric ceftriaxone/flagyl started 11/12>>. Ultrasound abdomen 11/20 showed mild wall thickening gallbladder, tender but no gallstone, has gallbladder sludge.  Will order HIDA scan.  Follow-up chest x-ray result, he abnormal question UTI follow-up blood culture he does have central line in place. Recent Labs  Lab 09/27/23 0422 09/28/23 0520 09/29/23 0510 09/30/23 0441 10/03/23 0415 10/03/23 0526  WBC 7.3 7.3 8.2 8.4 20.1*  --   LATICACIDVEN  --   --   --   --   --  0.9     Hypokalemia: Resolved  Permanent atrial fibrillation with RVR: Rate remains uncontrolled at times due to soft BP. Continue amiodarone, Toprol-XL and Eliquis.  BP has been too low to titrate metoprolol. Cardiology has been following closely advised heart rate up to 120s reasonable for a few continue current management  CAD w/ PCI placed 2 months ago: Continue Plavix  Essential hypertension: BP soft needed ivf 11/11. Stable today.Holding [place]. cont midodrine  Scrotal edema Elevate   Acute on chronic diastolic CHF: TTE- LVEF:60- to 65%, mild LVH, RV with normal systolic function,no  significant valvular disease. Cont metoprolol adjust fluid dialysis for nephrology. Avoiding ARB/ARNI due to AKI  Net IO Since Admission: 5,321.14 mL [10/03/23 0946]   Closed left femoral condyle avulsion fracture T12 compression fracture: Continue PT OT and pain control.  Seen by orthopedics  WBAT and follow-up outpatient with Dr. Zoe Lan need reconstructive surgery down the road but no acute surgical recommendation advised this admission   Anemia of chronic disease Monitor hemoglobin and transfuse if less than 7.   Recent Labs  Lab 09/27/23 0422 09/28/23 0520 09/29/23 0510 09/30/23 0441 10/03/23 0415  HGB 8.9* 9.2* 8.4* 8.1* 8.5*  HCT 26.5* 27.2* 24.5* 24.1* 25.2*     Malnutrition of moderate degree Continue nutritional supplements.   Obesity:Patient's Body mass index is 29.8 kg/m. : Will benefit with PCP follow-up, weight loss  healthy lifestyle and outpatient sleep evaluation.  DVT prophylaxis: SCDs Start: 09/20/23 0018 Place TED hose Start: 09/20/23 0018 Code Status:   Code Status: Full Code Family Communication: plan of care discussed with patient/wife at bedside. Patient status is: Inpatient because of AKI Level of care: Telemetry Cardiac   Dispo: The patient is from: home w/ wife            Anticipated disposition: TBD in Objective: Vitals last 24 hrs: Vitals:   10/03/23 0823 10/03/23 0824 10/03/23 0825 10/03/23 0828  BP:    125/86  Pulse: (!) 113 (!) 125 (!) 126 (!) 124  Resp:    18  Temp: 98 F (36.7 C) 99.6 F (37.6 C) 100 F (37.8 C)   TempSrc: Oral Axillary Rectal   SpO2: 93% 93% 93% 93%  Weight:      Height:       Weight change: -3.6 kg  Physical Examination: General exam: alert awake, ill peearing HEENT:Oral mucosa moist, Ear/Nose WNL grossly Respiratory system: Bilaterally clear BS,no use of accessory muscle Cardiovascular system: S1 & S2 +, No JVD. Gastrointestinal system: Abdomen soft, tender right upper quadrant ,ND, BS+ Nervous System: Alert, awake, moving all extremities,and following commands. Extremities: LE edema +,distal peripheral pulses palpable and warm.  Skin: No rashes,no icterus. MSK: Normal muscle bulk,tone, power   Medications reviewed:  Scheduled Meds:  amiodarone  200 mg Oral BID   apixaban  5 mg Oral BID    brimonidine  1 drop Left Eye Daily   And   timolol  1 drop Left Eye Daily   buPROPion  300 mg Oral Daily   Chlorhexidine Gluconate Cloth  6 each Topical Daily   cholecalciferol  1,000 Units Oral Daily   clopidogrel  75 mg Oral Daily   escitalopram  10 mg Oral Daily   feeding supplement  237 mL Oral BID BM   metoprolol succinate  25 mg Oral Daily   metroNIDAZOLE  500 mg Oral Q12H   midodrine  10 mg Oral TID WC   multivitamin  1 tablet Oral QHS   neomycin-bacitracin-polymyxin   Topical BID   sodium chloride flush  3 mL Intravenous Q12H   cyanocobalamin  100 mcg Oral Daily   Continuous Infusions:  cefTRIAXone (ROCEPHIN)  IV 2 g (10/03/23 0617)      Diet Order             Diet regular Fluid consistency: Thin  Diet effective now                   Nutrition Problem: Moderate Malnutrition Etiology: chronic illness (heart failure, afib) Signs/Symptoms: mild fat depletion, mild muscle depletion Interventions: Ensure  Enlive (each supplement provides 350kcal and 20 grams of protein), MVI, Magic cup, Education   Intake/Output Summary (Last 24 hours) at 10/03/2023 0946 Last data filed at 10/03/2023 8119 Gross per 24 hour  Intake 1340 ml  Output 600 ml  Net 740 ml   Net IO Since Admission: 5,321.14 mL [10/03/23 0946]  Wt Readings from Last 3 Encounters:  10/03/23 94.2 kg  09/05/23 84.4 kg  01/31/21 89.4 kg     Unresulted Labs (From admission, onward)     Start     Ordered   10/03/23 0500  CBC  Daily,   R     Question:  Specimen collection method  Answer:  Unit=Unit collect   10/02/23 0859   10/02/23 0500  Renal function panel  Daily,   R     Question:  Specimen collection method  Answer:  Unit=Unit collect   10/01/23 0753   09/28/23 0500  Magnesium  Daily,   R     Question:  Specimen collection method  Answer:  Unit=Unit collect   09/27/23 1107   09/19/23 2017  Prepare fresh frozen plasma  (**Emergency Blood Administration**)  ONCE - STAT,   STAT       Comments:  Emergent release   Question Answer Comment  # of Units 2 units   Special Requirements Other (Specify)   Emergent release call blood bank [facility] [phone]   Attestation: Emergent transfusion, the ordering licensed practitioner has determined that the risk of delaying transfusion outweighs the risk of transfusing incompatible or incompletely tested units.     Placed in "And" Linked Group   09/19/23 2016          Data Reviewed: I have personally reviewed following labs and imaging studies CBC: Recent Labs  Lab 09/27/23 0422 09/28/23 0520 09/29/23 0510 09/30/23 0441 10/03/23 0415  WBC 7.3 7.3 8.2 8.4 20.1*  HGB 8.9* 9.2* 8.4* 8.1* 8.5*  HCT 26.5* 27.2* 24.5* 24.1* 25.2*  MCV 86.6 86.6 87.5 89.3 89.7  PLT 203 223 279 316 374   Basic Metabolic Panel: Recent Labs  Lab 09/27/23 0422 09/28/23 0520 09/29/23 0510 09/30/23 0441 10/01/23 0410 10/01/23 0500 10/02/23 0515 10/03/23 0415 10/03/23 0422  NA 134*   < > 135 138  --  137 134*  --  135  K 3.3*   < > 3.4* 3.8  --  3.8 3.5  --  3.6  CL 97*   < > 97* 99  --  97* 96*  --  95*  CO2 29   < > 29 28  --  28 29  --  29  GLUCOSE 98   < > 98 89  --  123* 115*  --  111*  BUN 53*   < > 69* 69*  --  68* 63*  --  71*  CREATININE 5.93*   < > 6.48* 6.34*  --  5.66* 4.98*  --  4.69*  CALCIUM 7.7*   < > 8.0* 8.6*  --  8.6* 8.5*  --  8.6*  MG  --    < > 2.0 2.0 2.0  --  1.9 1.8  --   PHOS 4.7*  --   --   --   --  5.8* 5.4*  --  5.0*   < > = values in this interval not displayed.   GFR: Estimated Creatinine Clearance: 15.2 mL/min (A) (by C-G formula based on SCr of 4.69 mg/dL (H)). Liver Function Tests: Recent Labs  Lab 09/27/23  0422 10/01/23 0500 10/02/23 0515 10/03/23 0422 10/03/23 0526  AST  --   --   --   --  18  ALT  --   --   --   --  19  ALKPHOS  --   --   --   --  42  BILITOT  --   --   --   --  0.9  PROT  --   --   --   --  5.3*  ALBUMIN 1.7* 2.2* 2.0* 2.0* 2.0*   Recent Results (from the past 240 hour(s))   Resp panel by RT-PCR (RSV, Flu A&B, Covid) Anterior Nasal Swab     Status: None   Collection Time: 10/03/23  5:18 AM   Specimen: Anterior Nasal Swab  Result Value Ref Range Status   SARS Coronavirus 2 by RT PCR NEGATIVE NEGATIVE Final   Influenza A by PCR NEGATIVE NEGATIVE Final   Influenza B by PCR NEGATIVE NEGATIVE Final    Comment: (NOTE) The Xpert Xpress SARS-CoV-2/FLU/RSV plus assay is intended as an aid in the diagnosis of influenza from Nasopharyngeal swab specimens and should not be used as a sole basis for treatment. Nasal washings and aspirates are unacceptable for Xpert Xpress SARS-CoV-2/FLU/RSV testing.  Fact Sheet for Patients: BloggerCourse.com  Fact Sheet for Healthcare Providers: SeriousBroker.it  This test is not yet approved or cleared by the [place] FDA and has been authorized for detection and/or diagnosis of SARS-CoV-2 by FDA under an Emergency Use Authorization (EUA). This EUA will remain in effect (meaning this test can be used) for the duration of the COVID-19 declaration under Section 564(b)(1) of the Act, 21 U.S.C. section 360bbb-3(b)(1), unless the authorization is terminated or revoked.     Resp Syncytial Virus by PCR NEGATIVE NEGATIVE Final    Comment: (NOTE) Fact Sheet for Patients: BloggerCourse.com  Fact Sheet for Healthcare Providers: SeriousBroker.it  This test is not yet approved or cleared by the [place] FDA and has been authorized for detection and/or diagnosis of SARS-CoV-2 by FDA under an Emergency Use Authorization (EUA). This EUA will remain in effect (meaning this test can be used) for the duration of the COVID-19 declaration under Section 564(b)(1) of the Act, 21 U.S.C. section 360bbb-3(b)(1), unless the authorization is terminated or revoked.  Performed at [facility], [address] [place],  [place] [address]   Culture, blood (Routine X 2) w Reflex to ID Panel     Status: None (Preliminary result)   Collection Time: 10/03/23  5:26 AM   Specimen: BLOOD LEFT HAND  Result Value Ref Range Status   Specimen Description BLOOD LEFT HAND  Final   Special Requests   Final    BOTTLES DRAWN AEROBIC AND ANAEROBIC Blood Culture adequate volume   Culture   Final    NO GROWTH <12 HOURS Performed at [facility], [address], [place], [place] [address]    Report Status PENDING  Incomplete  Culture, blood (Routine X 2) w Reflex to ID Panel     Status: None (Preliminary result)   Collection Time: 10/03/23  5:29 AM   Specimen: BLOOD RIGHT HAND  Result Value Ref Range Status   Specimen Description BLOOD RIGHT HAND  Final   Special Requests   Final    BOTTLES DRAWN AEROBIC AND ANAEROBIC Blood Culture adequate volume   Culture   Final    NO GROWTH <12 HOURS Performed at [facility], [address],  [place], [place] [address]    Report Status PENDING  Incomplete    Antimicrobials: Anti-infectives (From admission, onward)    Start     Dose/Rate Route Frequency Ordered Stop   10/03/23 0600  cefTRIAXone (ROCEPHIN) 2 g in sodium chloride 0.9 % 100 mL IVPB       Note to Pharmacy: Start after blood cultures drawn   2 g 200 mL/hr over 30 Minutes Intravenous Every 24 hours 10/03/23 0514     10/03/23 0600  metroNIDAZOLE (FLAGYL) tablet 500 mg        500 mg Oral Every 12 hours 10/03/23 0514        Culture/Microbiology Radiology Studies: US Abdomen Limited RUQ (LIVER/GB)  Result Date: 10/03/2023 CLINICAL DATA:  Right upper quadrant abdominal pain EXAM: ULTRASOUND ABDOMEN LIMITED RIGHT UPPER QUADRANT COMPARISON:  Abdominal CT 09/19/2023. FINDINGS: Gallbladder: Avascular sludge in the gallbladder. There is focal tenderness in a 5 mm wall thickness with moderate distension. No gallstone detected. Common bile duct: Diameter: 3 mm Liver: No focal lesion identified. Within normal limits in  parenchymal echogenicity. Portal vein is patent on color Doppler imaging with normal direction of blood flow towards the liver. IMPRESSION: Tender gallbladder with mild wall thickening but no underlying gallstone. Gallbladder sludge. Electronically Signed   By: Tiburcio Pea M.D.   On: 10/03/2023 07:41     LOS: 13 days   Lanae Boast, MD [facility]  10/03/2023, 9:46 AM

## 2023-10-03 NOTE — Progress Notes (Signed)
Text page Dr. Lazarus Salines Patient has low grad temp 100.6 axillary. Hr At. Fib . 120-130 WBC 20.1. Bp 111/76

## 2023-10-03 NOTE — Progress Notes (Signed)
Nuclear radiology instructed not to give pain medicine after midnight for patients tomorrow hepato W/EF imaging

## 2023-10-03 NOTE — Progress Notes (Signed)
HD cath removed by IV Team RN.

## 2023-10-03 NOTE — Progress Notes (Signed)
Patient resting in bed Heart rate 120's At. Fib. Lopressor 5 mg I.V. given Jorge [place]. aware. No complaints voiced

## 2023-10-03 NOTE — Progress Notes (Signed)
[facility] Progress Note   Subjective:   I/Os yes 1.6 / 1.5.  Cardiology saw yest for Afib RVR in setting of low BPs - inc'd amiodarone, aim for HR < 120.   Fever overnight and RUQ pain - GB scan pending.  Sleeping this AM.   Objective Vitals:   10/03/23 0823 10/03/23 0824 10/03/23 0825 10/03/23 0828  BP:    125/86  Pulse: (!) 113 (!) 125 (!) 126 (!) 124  Resp:    18  Temp: 98 F (36.7 C) 99.6 F (37.6 C) 100 F (37.8 C)   TempSrc: Oral Axillary Rectal   SpO2: 93% 93% 93% 93%  Weight:      Height:       Physical Exam GEN: Lying in bed, sleeping ENT: MMM CV: Tachycardic, a fib on monitor but some into the 104 range PULM: no iwob, bilateral chest rise ABD: NABS, non-distended SKIN: Abrasions covered with gauze, otherwise no rashes or jaundice EXT: no edema, warm and well perfused; brace on LLE  Additional Objective Labs: Basic Metabolic Panel: Recent Labs  Lab 10/01/23 0500 10/02/23 0515 10/03/23 0422  NA 137 134* 135  K 3.8 3.5 3.6  CL 97* 96* 95*  CO2 28 29 29   GLUCOSE 123* 115* 111*  BUN 68* 63* 71*  CREATININE 5.66* 4.98* 4.69*  CALCIUM 8.6* 8.5* 8.6*  PHOS 5.8* 5.4* 5.0*   Liver Function Tests: Recent Labs  Lab 10/02/23 0515 10/03/23 0422 10/03/23 0526  AST  --   --  18  ALT  --   --  19  ALKPHOS  --   --  42  BILITOT  --   --  0.9  PROT  --   --  5.3*  ALBUMIN 2.0* 2.0* 2.0*   No results for input(s): "LIPASE", "AMYLASE" in the last 168 hours. CBC: Recent Labs  Lab 09/27/23 0422 09/28/23 0520 09/29/23 0510 09/30/23 0441 10/03/23 0415  WBC 7.3 7.3 8.2 8.4 20.1*  HGB 8.9* 9.2* 8.4* 8.1* 8.5*  HCT 26.5* 27.2* 24.5* 24.1* 25.2*  MCV 86.6 86.6 87.5 89.3 89.7  PLT 203 223 279 316 374   Blood Culture    Component Value Date/Time   SDES BLOOD RIGHT HAND 10/03/2023 0529   SPECREQUEST  10/03/2023 0529    BOTTLES DRAWN AEROBIC AND ANAEROBIC Blood Culture adequate volume   CULT  10/03/2023 0529    NO GROWTH <12  HOURS Performed at [facility], [address] [place], [place] [address]    REPTSTATUS PENDING 10/03/2023 0529    Cardiac Enzymes: Recent Labs  Lab 09/27/23 0422 09/28/23 0520  CKTOTAL 1,056* 635*   CBG: No results for input(s): "GLUCAP" in the last 168 hours. Iron Studies: No results for input(s): "IRON", "TIBC", "TRANSFERRIN", "FERRITIN" in the last 72 hours. @lablastinr3 @ Studies/Results: US Abdomen Limited RUQ (LIVER/GB)  Result Date: 10/03/2023 CLINICAL DATA:  Right upper quadrant abdominal pain EXAM: ULTRASOUND ABDOMEN LIMITED RIGHT UPPER QUADRANT COMPARISON:  Abdominal CT 09/19/2023. FINDINGS: Gallbladder: Avascular sludge in the gallbladder. There is focal tenderness in a 5 mm wall thickness with moderate distension. No gallstone detected. Common bile duct: Diameter: 3 mm Liver: No focal lesion identified. Within normal limits in parenchymal echogenicity. Portal vein is patent on color Doppler imaging with normal direction of blood flow towards the liver. IMPRESSION: Tender gallbladder with mild wall thickening but no underlying gallstone. Gallbladder sludge. Electronically Signed   By: Tiburcio Pea M.D.   On: 10/03/2023 07:41   Medications:  cefTRIAXone (ROCEPHIN)  IV 2 g (10/03/23 0617)    amiodarone  200 mg Oral BID   apixaban  5 mg Oral BID   brimonidine  1 drop Left Eye Daily   And   timolol  1 drop Left Eye Daily   buPROPion  300 mg Oral Daily   Chlorhexidine Gluconate Cloth  6 each Topical Daily   cholecalciferol  1,000 Units Oral Daily   clopidogrel  75 mg Oral Daily   escitalopram  10 mg Oral Daily   feeding supplement  237 mL Oral BID BM   metoprolol succinate  25 mg Oral Daily   metroNIDAZOLE  500 mg Oral Q12H   midodrine  10 mg Oral TID WC   multivitamin  1 tablet Oral QHS   neomycin-bacitracin-polymyxin   Topical BID   sodium chloride flush  3 mL Intravenous Q12H   cyanocobalamin  100 mcg Oral Daily    Assessment/Plan: **Oliguric AKI on  CKD 3B secondary to rhabdomyolysis and contrast nephrotoxicity -R internal jugular Temp HD cath 11/01 with IR -HD #1 09/23/23, HD #2 09/26/23 without complications. -Urine output seems to be picking up and creatinine now improving.  In setting of fever and no need for HD catheter at this time will request IV team remove today **Traumatic rhabdomyolysis, CK levels over all improved **Acute hypoxemic respiratory failure, overall stable on 2L [place]  **Permanent atrial fibrillation with RVR, followed by cardiology, on amiodarone.  Apixaban 5 mg BID.  Due to ongoing tachycardia and low BPs cardiology re consulted 11/11 and increased amio - options limited and they've signed off **CAD with PCI placed about 2 months ago, per cardiology. Plavix 75 mg daily.   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  **Closed left femoral condyle avulsion fracture, tibial plateau fracture, and brace currently, orthopedics following. **Hypertension: hypotensive in hospital - on midodrine TID now. Holding Entresto for AKI. A fib mgmt per above.  **Anemia: Continue under transfuse as needed.  Multifactorial **Fever 11/11 PM: cultures pending, RUQ pain reported - GB scan pending.  D/c HD catheter (blood cultures pending, site c/d/I).   Will continue to follow.      Estill Bakes MD 10/03/2023, 9:41 AM  [facility] Pager: [phone]

## 2023-10-03 NOTE — Progress Notes (Signed)
Inpatient Rehab Admissions Coordinator:   I received a denial from [facility] for CIR, citing lack of medical necessity.  Note pt febrile overnight, with tachycardia and leukocytosis up to 20.1.  Pending HIDA scan to f/u on abdominal US results.  Cardiology continues to follow for permanent AF.  I think it's reasonable to consider an appeal once pt feels better and potentially tolerating therapy a little better.  I will f/u later this week.   Estill Dooms, PT, DPT Admissions Coordinator [phone] 10/03/23  11:37 AM

## 2023-10-03 NOTE — Progress Notes (Signed)
Patient Heart Rate 120-130

## 2023-10-04 ENCOUNTER — Encounter (HOSPITAL_COMMUNITY): Payer: Medicare PPO

## 2023-10-04 ENCOUNTER — Inpatient Hospital Stay (HOSPITAL_COMMUNITY): Payer: Medicare PPO

## 2023-10-04 DIAGNOSIS — N179 Acute kidney failure, unspecified: Secondary | ICD-10-CM | POA: Diagnosis not present

## 2023-10-04 DIAGNOSIS — N1832 Chronic kidney disease, stage 3b: Secondary | ICD-10-CM | POA: Diagnosis not present

## 2023-10-04 HISTORY — PX: IR PERC CHOLECYSTOSTOMY: IMG2326

## 2023-10-04 LAB — RENAL FUNCTION PANEL
Albumin: 1.8 g/dL — ABNORMAL LOW (ref 3.5–5.0)
Anion gap: 12 (ref 5–15)
BUN: 79 mg/dL — ABNORMAL HIGH (ref 8–23)
CO2: 28 mmol/L (ref 22–32)
Calcium: 8.5 mg/dL — ABNORMAL LOW (ref 8.9–10.3)
Chloride: 96 mmol/L — ABNORMAL LOW (ref 98–111)
Creatinine, Ser: 4.78 mg/dL — ABNORMAL HIGH (ref 0.61–1.24)
GFR, Estimated: 12 mL/min — ABNORMAL LOW (ref >60)
Glucose, Bld: 100 mg/dL — ABNORMAL HIGH (ref 70–99)
Phosphorus: 6 mg/dL — ABNORMAL HIGH (ref 2.5–4.6)
Potassium: 4 mmol/L (ref 3.5–5.1)
Sodium: 136 mmol/L (ref 135–145)

## 2023-10-04 LAB — MAGNESIUM: Magnesium: 1.9 mg/dL (ref 1.7–2.4)

## 2023-10-04 LAB — CBC
HCT: 24.9 % — ABNORMAL LOW (ref 39.0–52.0)
Hemoglobin: 8 g/dL — ABNORMAL LOW (ref 13.0–17.0)
MCH: 29.4 pg (ref 26.0–34.0)
MCHC: 32.1 g/dL (ref 30.0–36.0)
MCV: 91.5 fL (ref 80.0–100.0)
Platelets: 402 10*3/uL — ABNORMAL HIGH (ref 150–400)
RBC: 2.72 MIL/uL — ABNORMAL LOW (ref 4.22–5.81)
RDW: 13.7 % (ref 11.5–15.5)
WBC: 19.7 10*3/uL — ABNORMAL HIGH (ref 4.0–10.5)
nRBC: 0 % (ref 0.0–0.2)

## 2023-10-04 LAB — PROTIME-INR
INR: 2.8 — ABNORMAL HIGH (ref 0.8–1.2)
Prothrombin Time: 30.1 s — ABNORMAL HIGH (ref 11.4–15.2)

## 2023-10-04 MED ORDER — TECHNETIUM TC 99M MEBROFENIN IV KIT
5.4000 | PACK | Freq: Once | INTRAVENOUS | Status: AC | PRN
  Administered 2023-10-04: 5.4 via INTRAVENOUS

## 2023-10-04 MED ORDER — MIDAZOLAM HCL 2 MG/2ML IJ SOLN
INTRAMUSCULAR | Status: AC
  Filled 2023-10-04: qty 4

## 2023-10-04 MED ORDER — LIDOCAINE-EPINEPHRINE 1 %-1:100000 IJ SOLN
INTRAMUSCULAR | Status: AC
  Filled 2023-10-04: qty 1

## 2023-10-04 MED ORDER — SODIUM CHLORIDE 0.9 % IV BOLUS
500.0000 mL | Freq: Once | INTRAVENOUS | Status: AC
  Administered 2023-10-04: 500 mL via INTRAVENOUS

## 2023-10-04 MED ORDER — HEPARIN SODIUM (PORCINE) 5000 UNIT/ML IJ SOLN
5000.0000 [IU] | Freq: Three times a day (TID) | INTRAMUSCULAR | Status: AC
  Administered 2023-10-04: 5000 [IU] via SUBCUTANEOUS
  Filled 2023-10-04: qty 1

## 2023-10-04 MED ORDER — SODIUM CHLORIDE 0.9 % IV SOLN: 2.0000 g | INTRAVENOUS | Status: DC

## 2023-10-04 MED ORDER — FENTANYL CITRATE (PF) 100 MCG/2ML IJ SOLN
INTRAMUSCULAR | Status: AC
  Filled 2023-10-04: qty 4

## 2023-10-04 MED ORDER — IOHEXOL 300 MG/ML  SOLN
50.0000 mL | Freq: Once | INTRAMUSCULAR | Status: AC | PRN
Start: 2023-10-04 — End: 2023-10-04
  Administered 2023-10-04: 15 mL

## 2023-10-04 MED ORDER — SODIUM CHLORIDE 0.9% FLUSH
5.0000 mL | Freq: Three times a day (TID) | INTRAVENOUS | Status: DC
  Administered 2023-10-04 – 2023-10-31 (×75): 5 mL

## 2023-10-04 MED ORDER — MORPHINE SULFATE (PF) 4 MG/ML IV SOLN
3.0000 mg | Freq: Once | INTRAVENOUS | Status: AC
Start: 2023-10-04 — End: 2023-10-04
  Administered 2023-10-04: 3 mg via INTRAVENOUS
  Filled 2023-10-04: qty 1

## 2023-10-04 MED ORDER — SODIUM CHLORIDE 0.9 % IV SOLN
INTRAVENOUS | Status: AC
  Filled 2023-10-04: qty 2

## 2023-10-04 MED ORDER — SODIUM CHLORIDE 0.9 % IV SOLN
INTRAVENOUS | Status: DC
Start: 2023-10-04 — End: 2023-10-05

## 2023-10-04 MED ORDER — FENTANYL CITRATE (PF) 100 MCG/2ML IJ SOLN
INTRAMUSCULAR | Status: AC | PRN
  Administered 2023-10-04: 50 ug via INTRAVENOUS

## 2023-10-04 MED ORDER — LIDOCAINE HCL 1 % IJ SOLN
10.0000 mL | Freq: Once | INTRAMUSCULAR | Status: AC
  Administered 2023-10-04: 10 mL via INTRADERMAL

## 2023-10-04 MED ORDER — SODIUM CHLORIDE 0.9 % IV SOLN
2.0000 g | INTRAVENOUS | Status: DC
  Filled 2023-10-04: qty 2

## 2023-10-04 NOTE — Procedures (Signed)
Interventional Radiology Procedure Note  Procedure: Image guided drain placement, perc chole.  28F pigtail drain.  Complications: None  EBL: None Sample: Culture sent  Recommendations: - Routine drain care, with sterile flushes, record output - follow up Cx - routine wound care  Signed,  Dulcy Fanny. Earleen Newport, DO

## 2023-10-04 NOTE — Consult Note (Signed)
[facility] Surgery Consult Note  Jorge West 10/31/1946  [id number].    Requesting MD: Lanae Boast Chief Complaint/Reason for Consult: cholecystitis   HPI:  Jorge West is a 77 y.o. male with multiple medical issues including CAD with recent PCI 07/2023 on plavix, A fib on eliquis, CKD, HTN, CHF EF 40-45%  who was admitted to [facility] 09/19/23 after suffering a crush injury to the left knee. He was found to have severe rhabdomyolysis and AKI on CKD. He did require dialysis this admission, but this was stopped 09/26/23. Yesterday he developed abdominal pain, leukocytosis with WBC 20, and TMAX 100. U/s obtained and shows sludge but no gallstones with mild wall thickening. HIDA obtained today and is consistent with acute cholecystitis. General surgery asked to see. Patient was started on IV rocephin/flagyl yesterday. He reports unchanged RUQ abdominal pain today with associated nausea and vomiting. He does report having similar episodes in the past, but nothing this severe and symptoms were typically self limiting.   Abdominal surgical history: none   Family History  Problem Relation Age of Onset   Cancer Other        family history    Colon cancer Neg Hx    Colon polyps Neg Hx    Rectal cancer Neg Hx    Stomach cancer Neg Hx     Past Medical History:  Diagnosis Date   Atrial fibrillation [facility])    Had an ablation done   Atypical mole 06/14/2000   moderate/marked on mid back Nino Glow)   Atypical mole 11/29/2001   slight/moderate on right shoulder Nino Glow)   Atypical mole 11/29/2001   moderate on right lateral abdomen Nino Glow)   Atypical mole 11/29/2001   moderate on right forearm Nino Glow)   Atypical mole 01/11/2007   moderate on lower mid back   Atypical mole 07/14/2008   SK and atypical solar lentigo on left jawline (widershave)   Basal cell carcinoma 07/29/1996   back left lower ear - CX3+excision   Basal cell carcinoma 09/22/2004   basosquamous  on right tip of nose (MOHs)   Basal cell carcinoma 10/20/2005   superficial on upper center back - CX3+5FU   Basal cell carcinoma 10/20/2011   right upper back - tx p bx   Basal cell carcinoma 02/07/2018   superficial/nodular on left upper arm - CX3+cautery+5FU   Basal cell carcinoma 10/23/2018   infiltrative on left sideburn [facility])   Benign prostate hyperplasia    Cataract    left and removed   Depression    Depression    Diverticulosis    Erectile dysfunction    Hyperlipidemia    Hypertension    Impaired fasting glucose    Nocturia    Personal history of colonic polyps 04/19/2005   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   Left Temporal Scalp (well diff)   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   left post auricular - clear at visit on 12/28/2001   Squamous cell carcinoma of skin 10/08/2003   well differentiated below outer left eye - MOHs   Squamous cell carcinoma of skin 06/02/2008   well differentiated behind left ear   Squamous cell carcinoma of skin 10/20/2011   KA on left elbow   Squamous cell carcinoma of skin 03/02/2015   well differentiated on right outer cheek - CX3+excision   Squamous cell carcinoma of skin 03/01/2017   well differentiated on left forearm - tx p bx   Squamous cell carcinoma of skin 03/07/2018  moderately differentiated on left jawline - CX3+excision    Past Surgical History:  Procedure Laterality Date   CATARACT EXTRACTION     COLONOSCOPY     IR FLUORO GUIDE CV LINE RIGHT  09/22/2023   IR US GUIDE VASC ACCESS RIGHT  09/22/2023   OPEN REDUCTION INTERNAL FIXATION (ORIF) METACARPAL  09/24/2012   Procedure: OPEN REDUCTION INTERNAL FIXATION (ORIF) METACARPAL;  Surgeon: Sharma Covert, MD;  Location: [facility] OR;  Service: Orthopedics;  Laterality: Left;  and proximal phalanges.   surgery for skin cancer      Social History:  reports that he has never smoked. He has quit using smokeless tobacco. He reports that he does not currently use  alcohol. He reports that he does not use drugs.  Allergies:  Allergies  Allergen Reactions   Terazosin Hcl     Dizziness and hypotension    Medications Prior to Admission  Medication Sig Dispense Refill   acetaminophen (TYLENOL) 325 MG tablet Take 2 tablets (650 mg total) by mouth every 6 (six) hours as needed for mild pain (or Fever >/= 101). 12 tablet 0   ALPRAZolam (XANAX) 0.5 MG tablet Take 0.5 mg by mouth 2 (two) times daily as needed for anxiety.     amLODipine (NORVASC) 10 MG tablet Take 10 mg by mouth daily.     apixaban (ELIQUIS) 5 MG TABS tablet Take 1 tablet (5 mg total) by mouth 2 (two) times daily. Blood thinner for stroke prevention 60 tablet 5   brimonidine-timolol (COMBIGAN) 0.2-0.5 % ophthalmic solution Place 1 drop into the left eye daily.     buPROPion (WELLBUTRIN XL) 300 MG 24 hr tablet Take 300 mg by mouth daily.     Cholecalciferol (VITAMIN D3) 3000 UNITS TABS Take 1 tablet by mouth daily.      clopidogrel (PLAVIX) 75 MG tablet Take 75 mg by mouth daily.     cyanocobalamin (VITAMIN B12) 100 MCG tablet Take 100 mcg by mouth daily.     escitalopram (LEXAPRO) 10 MG tablet Take 10 mg by mouth daily.     metoprolol succinate (TOPROL-XL) 25 MG 24 hr tablet Take 25 mg by mouth daily.     nitroGLYCERIN (NITROSTAT) 0.4 MG SL tablet Place 0.4 mg under the tongue every 5 (five) minutes as needed for chest pain.     Omega-3 Fatty Acids (FISH OIL PO) Take 1 capsule by mouth daily.      pantoprazole (PROTONIX) 40 MG tablet Take 40 mg by mouth daily.     rosuvastatin (CRESTOR) 40 MG tablet Take 40 mg by mouth daily.     sacubitril-valsartan (ENTRESTO) 24-26 MG Take 1 tablet by mouth daily.     vitamin C (ASCORBIC ACID) 500 MG tablet Take 1 tablet (500 mg total) by mouth daily. 90 tablet 0    Prior to Admission medications   Medication Sig Start Date End Date Taking? Authorizing Provider  acetaminophen (TYLENOL) 325 MG tablet Take 2 tablets (650 mg total) by mouth every 6 (six)  hours as needed for mild pain (or Fever >/= 101). 06/03/19  Yes Emokpae, Courage, MD  ALPRAZolam (XANAX) 0.5 MG tablet Take 0.5 mg by mouth 2 (two) times daily as needed for anxiety.   Yes [provider]  amLODipine (NORVASC) 10 MG tablet Take 10 mg by mouth daily. 08/19/23  Yes [provider]  apixaban (ELIQUIS) 5 MG TABS tablet Take 1 tablet (5 mg total) by mouth 2 (two) times daily. Blood thinner for stroke prevention  06/03/19  Yes Emokpae, Courage, MD  brimonidine-timolol (COMBIGAN) 0.2-0.5 % ophthalmic solution Place 1 drop into the left eye daily. 02/29/16  Yes [provider]  buPROPion (WELLBUTRIN XL) 300 MG 24 hr tablet Take 300 mg by mouth daily.   Yes [provider]  Cholecalciferol (VITAMIN D3) 3000 UNITS TABS Take 1 tablet by mouth daily.    Yes [provider]  clopidogrel (PLAVIX) 75 MG tablet Take 75 mg by mouth daily.   Yes [provider]  cyanocobalamin (VITAMIN B12) 100 MCG tablet Take 100 mcg by mouth daily.   Yes [provider]  escitalopram (LEXAPRO) 10 MG tablet Take 10 mg by mouth daily.   Yes [provider]  metoprolol succinate (TOPROL-XL) 25 MG 24 hr tablet Take 25 mg by mouth daily. 08/22/23  Yes [provider]  nitroGLYCERIN (NITROSTAT) 0.4 MG SL tablet Place 0.4 mg under the tongue every 5 (five) minutes as needed for chest pain.   Yes [provider]  Omega-3 Fatty Acids (FISH OIL PO) Take 1 capsule by mouth daily.    Yes [provider]  pantoprazole (PROTONIX) 40 MG tablet Take 40 mg by mouth daily.   Yes [provider]  rosuvastatin (CRESTOR) 40 MG tablet Take 40 mg by mouth daily.   Yes [provider]  sacubitril-valsartan (ENTRESTO) 24-26 MG Take 1 tablet by mouth daily.   Yes [provider]  vitamin C (ASCORBIC ACID) 500 MG tablet Take 1 tablet (500 mg total) by mouth daily. 09/25/12  Yes Bradly Bienenstock, MD    Blood pressure 114/77,  pulse (!) 129, temperature 100 F (37.8 C), temperature source Rectal, resp. rate 20, height 5\' 10"  (1.778 m), weight 95.6 kg, SpO2 91%. Physical Exam: General: elderly male who is laying in bed in NAD HEENT: head is normocephalic, atraumatic.  Sclera are noninjected.  Pupils equal and round.  Ears and nose without any masses or lesions.  Mouth is pink and moist. Dentition fair Heart: tachycardic  Lungs: CTAB, no wheezes, rhonchi, or rales noted.  Respiratory effort nonlabored on 3L [place] Abd: soft, mild distension, focally tender in the RUQ, +BS, no masses or organomegaly Skin: warm and dry Psych: A&Ox4 with an appropriate affect Neuro: nonfocal  Results for orders placed or performed during the hospital encounter of 09/19/23 (from the past 48 hour(s))  Magnesium     Status: None   Collection Time: 10/03/23  4:15 AM  Result Value Ref Range   Magnesium 1.8 1.7 - 2.4 mg/dL    Comment: Performed at [facility], [address], [place], [place] [address]  CBC     Status: Abnormal   Collection Time: 10/03/23  4:15 AM  Result Value Ref Range   WBC 20.1 (H) 4.0 - 10.5 K/uL   RBC 2.81 (L) 4.22 - 5.81 MIL/uL   Hemoglobin 8.5 (L) 13.0 - 17.0 g/dL   HCT 53.6 (L) 64.4 - 03.4 %   MCV 89.7 80.0 - 100.0 fL   MCH 30.2 26.0 - 34.0 pg   MCHC 33.7 30.0 - 36.0 g/dL   RDW 74.2 59.5 - 63.8 %   Platelets 374 150 - 400 K/uL   nRBC 0.0 0.0 - 0.2 %    Comment: Performed at [facility], [address] [place], [place] [address]  Renal function panel     Status: Abnormal   Collection Time: 10/03/23  4:22 AM  Result Value Ref Range   Sodium 135 135 -  145 mmol/L   Potassium 3.6 3.5 - 5.1 mmol/L   Chloride 95 (L) 98 - 111 mmol/L   CO2 29 22 - 32 mmol/L   Glucose, Bld 111 (H) 70 - 99 mg/dL    Comment: Glucose reference range applies only to samples taken after fasting for at least 8 hours.   BUN 71 (H) 8 - 23 mg/dL   Creatinine, Ser 1.91 (H) 0.61 - 1.24 mg/dL   Calcium 8.6 (L) 8.9 - 10.3  mg/dL   Phosphorus 5.0 (H) 2.5 - 4.6 mg/dL   Albumin 2.0 (L) 3.5 - 5.0 g/dL   GFR, Estimated 12 (L) >60 mL/min    Comment: (NOTE) Calculated using the CKD-EPI Creatinine Equation (2021)    Anion gap 11 5 - 15    Comment: Performed at [facility], [address], [place], [place] [address]  Resp panel by RT-PCR (RSV, Flu A&B, Covid) Anterior Nasal Swab     Status: None   Collection Time: 10/03/23  5:18 AM   Specimen: Anterior Nasal Swab  Result Value Ref Range   SARS Coronavirus 2 by RT PCR NEGATIVE NEGATIVE   Influenza A by PCR NEGATIVE NEGATIVE   Influenza B by PCR NEGATIVE NEGATIVE    Comment: (NOTE) The Xpert Xpress SARS-CoV-2/FLU/RSV plus assay is intended as an aid in the diagnosis of influenza from Nasopharyngeal swab specimens and should not be used as a sole basis for treatment. Nasal washings and aspirates are unacceptable for Xpert Xpress SARS-CoV-2/FLU/RSV testing.  Fact Sheet for Patients: BloggerCourse.com  Fact Sheet for Healthcare Providers: SeriousBroker.it  This test is not yet approved or cleared by the [place] FDA and has been authorized for detection and/or diagnosis of SARS-CoV-2 by FDA under an Emergency Use Authorization (EUA). This EUA will remain in effect (meaning this test can be used) for the duration of the COVID-19 declaration under Section 564(b)(1) of the Act, 21 U.S.C. section 360bbb-3(b)(1), unless the authorization is terminated or revoked.     Resp Syncytial Virus by PCR NEGATIVE NEGATIVE    Comment: (NOTE) Fact Sheet for Patients: BloggerCourse.com  Fact Sheet for Healthcare Providers: SeriousBroker.it  This test is not yet approved or cleared by the [place] FDA and has been authorized for detection and/or diagnosis of SARS-CoV-2 by FDA under an Emergency Use Authorization (EUA). This EUA will remain in effect  (meaning this test can be used) for the duration of the COVID-19 declaration under Section 564(b)(1) of the Act, 21 U.S.C. section 360bbb-3(b)(1), unless the authorization is terminated or revoked.  Performed at [facility], [address], [place], [place] [address]   Hepatic function panel     Status: Abnormal   Collection Time: 10/03/23  5:26 AM  Result Value Ref Range   Total Protein 5.3 (L) 6.5 - 8.1 g/dL   Albumin 2.0 (L) 3.5 - 5.0 g/dL   AST 18 15 - 41 U/L   ALT 19 0 - 44 U/L   Alkaline Phosphatase 42 38 - 126 U/L   Total Bilirubin 0.9 <1.2 mg/dL   Bilirubin, Direct 0.3 (H) 0.0 - 0.2 mg/dL   Indirect Bilirubin 0.6 0.3 - 0.9 mg/dL    Comment: Performed at [facility], [address] [place], [place] [address]  Culture, blood (Routine X 2) w Reflex to ID Panel     Status: None (Preliminary result)   Collection Time: 10/03/23  5:26 AM   Specimen: BLOOD LEFT HAND  Result Value Ref Range  Specimen Description BLOOD LEFT HAND    Special Requests      BOTTLES DRAWN AEROBIC AND ANAEROBIC Blood Culture adequate volume   Culture      NO GROWTH 1 DAY Performed at [facility], [address] [place], [place] [address]    Report Status PENDING   Lactic acid, plasma     Status: None   Collection Time: 10/03/23  5:26 AM  Result Value Ref Range   Lactic Acid, Venous 0.9 0.5 - 1.9 mmol/L    Comment: Performed at [facility], [address], [place], [place] [address]  Culture, blood (Routine X 2) w Reflex to ID Panel     Status: None (Preliminary result)   Collection Time: 10/03/23  5:29 AM   Specimen: BLOOD RIGHT HAND  Result Value Ref Range   Specimen Description BLOOD RIGHT HAND    Special Requests      BOTTLES DRAWN AEROBIC AND ANAEROBIC Blood Culture adequate volume   Culture      NO GROWTH 1 DAY Performed at [facility], [address] [place], [place] [address]    Report Status PENDING   Urinalysis, Routine w reflex microscopic  -Urine, Clean Catch     Status: Abnormal   Collection Time: 10/03/23  6:37 AM  Result Value Ref Range   Color, Urine YELLOW YELLOW   APPearance HAZY (A) CLEAR   Specific Gravity, Urine 1.015 1.005 - 1.030   pH 5.0 5.0 - 8.0   Glucose, UA NEGATIVE NEGATIVE mg/dL   Hgb urine dipstick SMALL (A) NEGATIVE   Bilirubin Urine NEGATIVE NEGATIVE   Ketones, ur NEGATIVE NEGATIVE mg/dL   Protein, ur 30 (A) NEGATIVE mg/dL   Nitrite NEGATIVE NEGATIVE   Leukocytes,Ua MODERATE (A) NEGATIVE   RBC / HPF 0-5 0 - 5 RBC/hpf   WBC, UA 21-50 0 - 5 WBC/hpf   Bacteria, UA RARE (A) NONE SEEN   Squamous Epithelial / HPF 0-5 0 - 5 /HPF    Comment: Performed at [facility], [address], [place], [place] [address]  Magnesium     Status: None   Collection Time: 10/04/23  4:19 AM  Result Value Ref Range   Magnesium 1.9 1.7 - 2.4 mg/dL    Comment: Performed at [facility], [address] [place], [place] [address]  CBC     Status: Abnormal   Collection Time: 10/04/23  4:19 AM  Result Value Ref Range   WBC 19.7 (H) 4.0 - 10.5 K/uL   RBC 2.72 (L) 4.22 - 5.81 MIL/uL   Hemoglobin 8.0 (L) 13.0 - 17.0 g/dL   HCT 29.5 (L) 28.4 - 13.2 %   MCV 91.5 80.0 - 100.0 fL   MCH 29.4 26.0 - 34.0 pg   MCHC 32.1 30.0 - 36.0 g/dL   RDW 44.0 10.2 - 72.5 %   Platelets 402 (H) 150 - 400 K/uL   nRBC 0.0 0.0 - 0.2 %    Comment: Performed at [facility], [address] [place], [place] [address]  Renal function panel     Status: Abnormal   Collection Time: 10/04/23  4:19 AM  Result Value Ref Range   Sodium 136 135 - 145 mmol/L   Potassium 4.0 3.5 - 5.1 mmol/L   Chloride 96 (L) 98 - 111 mmol/L   CO2 28 22 - 32 mmol/L   Glucose, Bld 100 (H) 70 - 99 mg/dL    Comment:  Glucose reference range applies only to samples taken after fasting for at least 8 hours.   BUN 79 (H) 8 - 23 mg/dL   Creatinine, Ser 2.95 (H) 0.61 - 1.24 mg/dL   Calcium 8.5 (L) 8.9 - 10.3 mg/dL   Phosphorus 6.0 (H) 2.5 - 4.6 mg/dL    Albumin 1.8 (L) 3.5 - 5.0 g/dL   GFR, Estimated 12 (L) >60 mL/min    Comment: (NOTE) Calculated using the CKD-EPI Creatinine Equation (2021)    Anion gap 12 5 - 15    Comment: Performed at [facility], [address], [place], [place] [address]   NM Hepatobiliary Liver Func  Result Date: 10/04/2023 CLINICAL DATA:  RIGHT upper quadrant pain. Concern for acalculous cholecystitis. EXAM: NUCLEAR MEDICINE HEPATOBILIARY IMAGING TECHNIQUE: Sequential images of the abdomen were obtained out to 60 minutes following intravenous administration of radiopharmaceutical. RADIOPHARMACEUTICALS:  5.4 mCi Tc-71m  Choletec IV COMPARISON:  Ultrasound 10/03/2023 FINDINGS: Prompt clearance radiotracer from blood pool and homogeneous uptake in liver. Counts are evident within the small bowel by 20 minutes. Gallbladder failed to fill over 90 minutes of imaging (extended pre morphine time of 90 minutes performed as study was ordered with ejection fraction) At 90 minutes, 3 mg of IV morphine were administered. gallbladder failed to fill after morphine augmentation. IMPRESSION: 1. Non filling of the gallbladder most consistent with acute cholecystitis. 2. Patent common duct. These results will be called to the ordering clinician or representative by the Radiologist Assistant, and communication documented in the PACS or [employer]. Electronically Signed   By: Genevive Bi M.D.   On: 10/04/2023 12:56   DG CHEST PORT 1 VIEW  Result Date: 10/03/2023 CLINICAL DATA:  Fever. EXAM: PORTABLE CHEST 1 VIEW COMPARISON:  09/19/2023 FINDINGS: Right jugular dialysis catheter with the tip near the superior cavoatrial junction. Increased densities at the left lung base could represent pleural fluid and consolidation. Heart size is upper limits of normal. Prominent central vascular markings. Negative for a pneumothorax. Patchy densities in the right lower lobe are suggestive for atelectasis. IMPRESSION: 1. New densities at the  left lung base. Findings could represent pleural fluid and consolidation. Infection cannot be excluded at the left lung base. 2. Probable right basilar atelectasis. Electronically Signed   By: Richarda Overlie M.D.   On: 10/03/2023 11:53   US Abdomen Limited RUQ (LIVER/GB)  Result Date: 10/03/2023 CLINICAL DATA:  Right upper quadrant abdominal pain EXAM: ULTRASOUND ABDOMEN LIMITED RIGHT UPPER QUADRANT COMPARISON:  Abdominal CT 09/19/2023. FINDINGS: Gallbladder: Avascular sludge in the gallbladder. There is focal tenderness in a 5 mm wall thickness with moderate distension. No gallstone detected. Common bile duct: Diameter: 3 mm Liver: No focal lesion identified. Within normal limits in parenchymal echogenicity. Portal vein is patent on color Doppler imaging with normal direction of blood flow towards the liver. IMPRESSION: Tender gallbladder with mild wall thickening but no underlying gallstone. Gallbladder sludge. Electronically Signed   By: Tiburcio Pea M.D.   On: 10/03/2023 07:41    Anti-infectives (From admission, onward)    Start     Dose/Rate Route Frequency Ordered Stop   10/03/23 0600  cefTRIAXone (ROCEPHIN) 2 g in sodium chloride 0.9 % 100 mL IVPB       Note to Pharmacy: Start after blood cultures drawn   2 g 200 mL/hr over 30 Minutes Intravenous Every 24 hours 10/03/23 0514     10/03/23 0600  metroNIDAZOLE (FLAGYL) tablet 500 mg  500 mg Oral Every 12 hours 10/03/23 0514          Assessment/Plan Acute cholecystitis  - Patient admitted two weeks ago with rhabdomyolysis and acute on chronic renal failure. He developed RUQ abdominal pain, nausea, and vomiting as well as leukocytosis and fever yesterday. Work up included u/s which shows sludge but no gallstones. HIDA obtained and is consistent with acute cholecystitis. Given his multiple comorbidities as above, patient would be high risk for surgical intervention. Do not recommend cholecystectomy at this time. He has been started on IV  antibiotics with no improvement in his abdominal pain as of yet. Will ask IR to see for consideration of percutaneous cholecystostomy tube placement. Keep NPO. He is on eliquis which may need to be held or switched to heparin gtt prior to procedure.  ID - rocephin/flagyl VTE - eliquis FEN - NPO Foley - none  Recent LLE crush injury, rhabdomyolysis AKI on CKD CAD with recent PCI 07/2023 on plavix A fib on eliquis CKD HTN CHF EF 40-45%  I reviewed Consultant cardiology notes, hospitalist notes, last 24 h vitals and pain scores, last 48 h intake and output, last 24 h labs and trends, and last 24 h imaging results.   Franne Forts, PA-C [facility] Surgery 10/04/2023, 1:52 PM Please see Amion for pager number during day hours 7:00am-4:30pm

## 2023-10-04 NOTE — Progress Notes (Addendum)
PT Cancellation Note  Patient Details Name: Jorge West MRN: [id number] DOB: 15-Dec-1945   Cancelled Treatment:    Reason Eval/Treat Not Completed: Patient at procedure or test/unavailable (pt oof unit for HIDA scan. Will follow up at later date/time as schedule allows and pt able.)  Checked back in PM, pt tachy and febrile, plan to go to IR for drain placement. Will hold off and follow up at later date/time as pt able and schedule allows.  Wynn Maudlin, DPT Acute Rehabilitation Services Office [phone]  10/04/23 10:31 AM

## 2023-10-04 NOTE — Progress Notes (Signed)
[facility] Progress Note   Subjective:   I/Os yes 0.9 / 0.75.  Hida scan this AM c/w acute cholecystitis and had perc drain placed today.  Feels a bit better.  PO intake has been poor.  HD catheter removed yesterday.   Objective Vitals:   10/03/23 2305 10/04/23 0315 10/04/23 0500 10/04/23 0734  BP: 117/73 111/71  122/76  Pulse: (!) 119 (!) 125  (!) 129  Resp: 20 20  20   Temp: 98.8 F (37.1 C) 98 F (36.7 C)  98.2 F (36.8 C)  TempSrc: Oral Oral  Oral  SpO2: 96% 95%  94%  Weight:   95.6 kg   Height:       Physical Exam GEN: Lying in bed, comfortable ENT: MM tacky  CV: Tachycardic, a fib on monitor PULM: no iwob, bilateral chest rise ABD: RUQ bilious drain SKIN: Abrasions covered with gauze, otherwise no rashes or jaundice EXT: no edema, warm and well perfused; brace on LLE  Additional Objective Labs: Basic Metabolic Panel: Recent Labs  Lab 10/02/23 0515 10/03/23 0422 10/04/23 0419  NA 134* 135 136  K 3.5 3.6 4.0  CL 96* 95* 96*  CO2 29 29 28   GLUCOSE 115* 111* 100*  BUN 63* 71* 79*  CREATININE 4.98* 4.69* 4.78*  CALCIUM 8.5* 8.6* 8.5*  PHOS 5.4* 5.0* 6.0*   Liver Function Tests: Recent Labs  Lab 10/03/23 0422 10/03/23 0526 10/04/23 0419  AST  --  18  --   ALT  --  19  --   ALKPHOS  --  42  --   BILITOT  --  0.9  --   PROT  --  5.3*  --   ALBUMIN 2.0* 2.0* 1.8*   No results for input(s): "LIPASE", "AMYLASE" in the last 168 hours. CBC: Recent Labs  Lab 09/28/23 0520 09/29/23 0510 09/30/23 0441 10/03/23 0415 10/04/23 0419  WBC 7.3 8.2 8.4 20.1* 19.7*  HGB 9.2* 8.4* 8.1* 8.5* 8.0*  HCT 27.2* 24.5* 24.1* 25.2* 24.9*  MCV 86.6 87.5 89.3 89.7 91.5  PLT 223 279 316 374 402*   Blood Culture    Component Value Date/Time   SDES BLOOD RIGHT HAND 10/03/2023 0529   SPECREQUEST  10/03/2023 0529    BOTTLES DRAWN AEROBIC AND ANAEROBIC Blood Culture adequate volume   CULT  10/03/2023 0529    NO GROWTH 1 DAY Performed at [facility], [address] [place], [place] [address]    REPTSTATUS PENDING 10/03/2023 0529    Cardiac Enzymes: Recent Labs  Lab 09/28/23 0520  CKTOTAL 635*   CBG: No results for input(s): "GLUCAP" in the last 168 hours. Iron Studies: No results for input(s): "IRON", "TIBC", "TRANSFERRIN", "FERRITIN" in the last 72 hours. @lablastinr3 @ Studies/Results: DG CHEST PORT 1 VIEW  Result Date: 10/03/2023 CLINICAL DATA:  Fever. EXAM: PORTABLE CHEST 1 VIEW COMPARISON:  09/19/2023 FINDINGS: Right jugular dialysis catheter with the tip near the superior cavoatrial junction. Increased densities at the left lung base could represent pleural fluid and consolidation. Heart size is upper limits of normal. Prominent central vascular markings. Negative for a pneumothorax. Patchy densities in the right lower lobe are suggestive for atelectasis. IMPRESSION: 1. New densities at the left lung base. Findings could represent pleural fluid and consolidation. Infection cannot be excluded at the left lung base. 2. Probable right basilar atelectasis. Electronically Signed   By: Richarda Overlie M.D.   On: 10/03/2023 11:53   US Abdomen Limited RUQ (LIVER/GB)  Result Date:  10/03/2023 CLINICAL DATA:  Right upper quadrant abdominal pain EXAM: ULTRASOUND ABDOMEN LIMITED RIGHT UPPER QUADRANT COMPARISON:  Abdominal CT 09/19/2023. FINDINGS: Gallbladder: Avascular sludge in the gallbladder. There is focal tenderness in a 5 mm wall thickness with moderate distension. No gallstone detected. Common bile duct: Diameter: 3 mm Liver: No focal lesion identified. Within normal limits in parenchymal echogenicity. Portal vein is patent on color Doppler imaging with normal direction of blood flow towards the liver. IMPRESSION: Tender gallbladder with mild wall thickening but no underlying gallstone. Gallbladder sludge. Electronically Signed   By: Tiburcio Pea M.D.   On: 10/03/2023 07:41   Medications:  cefTRIAXone (ROCEPHIN)  IV Stopped  (10/04/23 0603)    amiodarone  200 mg Oral BID   apixaban  5 mg Oral BID   brimonidine  1 drop Left Eye Daily   And   timolol  1 drop Left Eye Daily   buPROPion  300 mg Oral Daily   Chlorhexidine Gluconate Cloth  6 each Topical Daily   cholecalciferol  1,000 Units Oral Daily   clopidogrel  75 mg Oral Daily   escitalopram  10 mg Oral Daily   feeding supplement  1 Container Oral TID BM   metoprolol succinate  25 mg Oral Daily   metroNIDAZOLE  500 mg Oral Q12H   midodrine  10 mg Oral TID WC    morphine injection  3 mg Intravenous Once   multivitamin  1 tablet Oral QHS   sodium chloride flush  3 mL Intravenous Q12H   cyanocobalamin  100 mcg Oral Daily    Assessment/Plan: **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 -HD catheter removed 11/12 in setting of improving nonoliguric AKI (hadn't been used since 11/5) + fever -Renal function slightly worse today in setting of some modest hypotension and fever --> no need to reinsert yet but certainly at risk. Discussed w family -volume status mildly intravasc dry - on MIVF - agree for now **Traumatic rhabdomyolysis, CK levels over all improved **Acute hypoxemic respiratory failure, on 3L [place] now and +4.5L per I/Os - will check CXR and decide on diuresis.  **Permanent atrial fibrillation with RVR, followed by cardiology, on amiodarone.  Apixaban 5 mg BID.  Due to ongoing tachycardia and low BPs cardiology re consulted 11/11 and increased amio - options limited and they've signed off **CAD with PCI placed about 2 months ago, per cardiology. Plavix 75 mg daily.   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  **Closed left femoral condyle avulsion fracture, tibial plateau fracture, and brace currently, orthopedics following. **Hypertension: hypotensive in hospital - on midodrine TID now. Holding Entresto for  AKI. A fib mgmt per above.  BPs have been ok in the past 24h. **Anemia: Continue under transfuse as needed.  Multifactorial **Fever 11/11 PM: blood cultures NGTD , appears acute cholecystitis.  Resp viral swab neg.   Will continue to follow.      Estill Bakes MD 10/04/2023, 10:35 AM  [facility] Pager: [phone]

## 2023-10-04 NOTE — Progress Notes (Addendum)
PROGRESS NOTE Jorge West  ZOX:[id number] DOB: 1946/07/24 DOA: 09/19/2023 PCP: Elizabeth Palau, FNP  Brief Narrative/Hospital Course: 78 year old with history of CHF, A-fib, CAD, HTN, CKD, BPH presenting with left lower extremity trauma Anteroapical and then falling on the floor. Apparently was trapped for about 2 hours until family found him. Upon admission CT head, cervical spine negative, CT chest abdomen pelvis showed acute T12 compression fracture, CT of lower extremity showed asymmetric enlargement of muscle on the left.  Patient was admitted for left femoral condyle avulsion fracture, severe rhabdomyolysis and AKI on CKD. Patient started on dialysis.  Nephrology is following closely     Subjective: Seen and examined Overnight patient remains tachycardic but afebrile, his HD catheter was removed 11/12  Labs/WBC count slightly downtrending 19.7, creatinine stable slightly up 4.7 BUN 79 Urine output 750 cc HIDA scan done Still having RUQ abdomen pain   Assessment and Plan: Principal Problem:   Acute kidney injury superimposed on stage 3b chronic kidney disease (HCC) Active Problems:   Atrial fibrillation with RVR (HCC)   Essential hypertension   Closed left femoral condyle avulsion fracture (HCC)   Coronary artery disease   Anemia of chronic disease   Malnutrition of moderate degree   Persistent atrial fibrillation (HCC)  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  Right internal jugular tunneled HD cath 11/1- removed 11/12 Urine output decreasing, creatinine more or less about the same slightly up, nephrology following closely HD remains on hold. Recent Labs    09/25/23 0328 09/26/23 0536 09/27/23 0422 09/28/23 0520 09/29/23 0510 09/30/23 0441 10/01/23 0500 10/02/23 0515 10/03/23 0422 10/04/23 0419  BUN 55* 65* 53* 61* 69* 69* 68* 63* 71* 79*  CREATININE 6.87* 7.23* 5.93* 6.17* 6.48* 6.34*  5.66* 4.98* 4.69* 4.78*  CO2 31 30 29 29 29 28 28 29 29  28  K 3.1* 3.2* 3.3* 3.6 3.4* 3.8 3.8 3.5 3.6 4.0   Intake/Output Summary (Last 24 hours) at 10/04/2023 1247 Last data filed at 10/04/2023 8119 Gross per 24 hour  Intake 677 ml  Output 900 ml  Net -223 ml     Traumatic rhabdomyolysis: CK has improved overall continue oral hydration  Acute cholecystitis Fever episode 11/12 Leukocytosis Right upper quadrant abdominal pain Pyuria: On empiric ceftriaxone/flagyl started 11/12>>. Ultrasound abdomen 11/12 showed mild wall thickening gallbladder, tender but no gallstone, has gallbladder sludge. pending HIDA scan. Cxr shows new densities in the left lung base could represent pleural fluid, consolidation exclude infection.  Pro-Cal not elevated.  Blood culture sent 11/12 so far negative.  UA with WBC 21-50 moderate LE likely UTI-order urine culture HIDA scan came back abnormal consistent with acute cholecystitis, I paged and consult general surgery and they are going to see him soon keep n.p.o. keep on antibiotics as mentioned above. Recent Labs  Lab 09/28/23 0520 09/29/23 0510 09/30/23 0441 10/03/23 0415 10/03/23 0526 10/04/23 0419  WBC 7.3 8.2 8.4 20.1*  --  19.7*  LATICACIDVEN  --   --   --   --  0.9  --      Hypokalemia: Resolved  Permanent atrial fibrillation with RVR: Heart rate remains borderline controlled.Continue amiodarone, Toprol-XL and Eliquis.  BP has been too low to titrate metoprolol. Cardiology has been following closely advised heart rate up to 120s reasonable for a fib and signed off 11/12 Discussed w/ Dr Rennis Golden ok to hold off eliquis for now for biliary drain.  CAD w/ PCI placed  2 months ago: Continue Plavix.  Essential hypertension: BP soft needed ivf 11/11. Holding [place]. cont midodrine  Scrotal edema Elevate   Acute hypoxic respiratory failure: Multifactorial in the setting of CHF, now with abdomen pain limiting his thoracic respite daily  movement.  "Supplement oxygen, I-S.  Acute on chronic diastolic CHF: TTE- LVEF:60- to 65%, mild LVH, RV with normal systolic function,no significant valvular disease. Cont metoprolol adjust fluid dialysis for nephrology. Avoiding ARB/ARNI due to AKI  Net IO Since Admission: 5,338.14 mL [10/04/23 1247]   Closed left femoral condyle avulsion fracture T12 compression fracture: Continue PT OT and pain control.  Seen by orthopedics WBAT and follow-up outpatient with Dr. Zoe Lan need reconstructive surgery down the road but no acute surgical recommendation advised this admission   Anemia of chronic disease Monitor hemoglobin and transfuse if less than 7.   Recent Labs  Lab 09/28/23 0520 09/29/23 0510 09/30/23 0441 10/03/23 0415 10/04/23 0419  HGB 9.2* 8.4* 8.1* 8.5* 8.0*  HCT 27.2* 24.5* 24.1* 25.2* 24.9*    Malnutrition of moderate degree Continue nutritional supplements Nutrition Problem: Moderate Malnutrition Etiology: chronic illness (heart failure, afib) Signs/Symptoms: mild fat depletion, mild muscle depletion Interventions: Ensure Enlive (each supplement provides 350kcal and 20 grams of protein), MVI, Magic cup, Education  .  Obesity:Patient's Body mass index is 30.24 kg/m. : Will benefit with PCP follow-up, weight loss  healthy lifestyle and outpatient sleep evaluation.  DVT prophylaxis: SCDs Start: 09/20/23 0018 Place TED hose Start: 09/20/23 0018 eliquis Code Status:   Code Status: Full Code Family Communication: plan of care discussed with patient/wife at bedside updated. Patient status is: Inpatient because of AKI Level of care: Telemetry Cardiac   Dispo: The patient is from: home w/ wife            Anticipated disposition: TBD in Objective: Vitals last 24 hrs: Vitals:   10/04/23 0500 10/04/23 0734 10/04/23 1203 10/04/23 1235  BP:  122/76 112/87 112/87  Pulse:  (!) 129 (!) 120 (!) 120  Resp:  20 18   Temp:  98.2 F (36.8 C) 99.4 F (37.4 C)   TempSrc:  Oral  Temporal   SpO2:  94% 92%   Weight: 95.6 kg     Height:       Weight change: 1.4 kg  Physical Examination: General exam: alert awake, oriented , ill appearing and frail\ HEENT:Oral mucosa moist, Ear/Nose WNL grossly Respiratory system: Bilaterally clear BS,no use of accessory muscle Cardiovascular system: S1 & S2 +, No JVD. Gastrointestinal system: Abdomen soft, tender on RUQ, ND, BS+ Nervous System: Alert, awake, moving all extremities,and following commands. Extremities: LE edema neg,distal peripheral pulses palpable and warm.  Skin: No rashes,no icterus. MSK: Normal muscle bulk,tone, power   Medications reviewed:  Scheduled Meds:  amiodarone  200 mg Oral BID   apixaban  5 mg Oral BID   brimonidine  1 drop Left Eye Daily   And   timolol  1 drop Left Eye Daily   buPROPion  300 mg Oral Daily   Chlorhexidine Gluconate Cloth  6 each Topical Daily   cholecalciferol  1,000 Units Oral Daily   clopidogrel  75 mg Oral Daily   escitalopram  10 mg Oral Daily   feeding supplement  1 Container Oral TID BM   metoprolol succinate  25 mg Oral Daily   metroNIDAZOLE  500 mg Oral Q12H   midodrine  10 mg Oral TID WC   multivitamin  1 tablet Oral QHS   sodium chloride  flush  3 mL Intravenous Q12H   cyanocobalamin  100 mcg Oral Daily   Continuous Infusions:  cefTRIAXone (ROCEPHIN)  IV Stopped (10/04/23 0603)      Diet Order             Diet NPO time specified  Diet effective midnight                   Intake/Output Summary (Last 24 hours) at 10/04/2023 1247 Last data filed at 10/04/2023 0603 Gross per 24 hour  Intake 677 ml  Output 900 ml  Net -223 ml   Net IO Since Admission: 5,338.14 mL [10/04/23 1247]  Wt Readings from Last 3 Encounters:  10/04/23 95.6 kg  09/05/23 84.4 kg  01/31/21 89.4 kg     Unresulted Labs (From admission, onward)     Start     Ordered   10/04/23 0909  Urine Culture (for pregnant, neutropenic or urologic patients or patients with an  indwelling urinary catheter)  (Urine Labs)  Add-on,   AD       Question:  Indication  Answer:  Dysuria   10/04/23 0908   10/03/23 0500  CBC  Daily,   R     Question:  Specimen collection method  Answer:  Unit=Unit collect   10/02/23 0859   10/02/23 0500  Renal function panel  Daily,   R     Question:  Specimen collection method  Answer:  Unit=Unit collect   10/01/23 0753   09/19/23 2017  Prepare fresh frozen plasma  (**Emergency Blood Administration**)  ONCE - STAT,   STAT       Comments: Emergent release   Question Answer Comment  # of Units 2 units   Special Requirements Other (Specify)   Emergent release call blood bank [facility] [phone]   Attestation: Emergent transfusion, the ordering licensed practitioner has determined that the risk of delaying transfusion outweighs the risk of transfusing incompatible or incompletely tested units.     Placed in "And" Linked Group   09/19/23 2016          Data Reviewed: I have personally reviewed following labs and imaging studies CBC: Recent Labs  Lab 09/28/23 0520 09/29/23 0510 09/30/23 0441 10/03/23 0415 10/04/23 0419  WBC 7.3 8.2 8.4 20.1* 19.7*  HGB 9.2* 8.4* 8.1* 8.5* 8.0*  HCT 27.2* 24.5* 24.1* 25.2* 24.9*  MCV 86.6 87.5 89.3 89.7 91.5  PLT 223 279 316 374 402*   Basic Metabolic Panel: Recent Labs  Lab 09/30/23 0441 10/01/23 0410 10/01/23 0500 10/02/23 0515 10/03/23 0415 10/03/23 0422 10/04/23 0419  NA 138  --  137 134*  --  135 136  K 3.8  --  3.8 3.5  --  3.6 4.0  CL 99  --  97* 96*  --  95* 96*  CO2 28  --  28 29  --  29 28  GLUCOSE 89  --  123* 115*  --  111* 100*  BUN 69*  --  68* 63*  --  71* 79*  CREATININE 6.34*  --  5.66* 4.98*  --  4.69* 4.78*  CALCIUM 8.6*  --  8.6* 8.5*  --  8.6* 8.5*  MG 2.0 2.0  --  1.9 1.8  --  1.9  PHOS  --   --  5.8* 5.4*  --  5.0* 6.0*   GFR: Estimated Creatinine Clearance: 15 mL/min (A) (by C-G formula based on SCr of 4.78 mg/dL (H)). Liver Function Tests: Recent  Labs  Lab 10/01/23 0500 10/02/23 0515 10/03/23 0422 10/03/23 0526 10/04/23 0419  AST  --   --   --  18  --   ALT  --   --   --  19  --   ALKPHOS  --   --   --  42  --   BILITOT  --   --   --  0.9  --   PROT  --   --   --  5.3*  --   ALBUMIN 2.2* 2.0* 2.0* 2.0* 1.8*   Recent Results (from the past 240 hour(s))  Resp panel by RT-PCR (RSV, Flu A&B, Covid) Anterior Nasal Swab     Status: None   Collection Time: 10/03/23  5:18 AM   Specimen: Anterior Nasal Swab  Result Value Ref Range Status   SARS Coronavirus 2 by RT PCR NEGATIVE NEGATIVE Final   Influenza A by PCR NEGATIVE NEGATIVE Final   Influenza B by PCR NEGATIVE NEGATIVE Final    Comment: (NOTE) The Xpert Xpress SARS-CoV-2/FLU/RSV plus assay is intended as an aid in the diagnosis of influenza from Nasopharyngeal swab specimens and should not be used as a sole basis for treatment. Nasal washings and aspirates are unacceptable for Xpert Xpress SARS-CoV-2/FLU/RSV testing.  Fact Sheet for Patients: BloggerCourse.com  Fact Sheet for Healthcare Providers: SeriousBroker.it  This test is not yet approved or cleared by the [place] FDA and has been authorized for detection and/or diagnosis of SARS-CoV-2 by FDA under an Emergency Use Authorization (EUA). This EUA will remain in effect (meaning this test can be used) for the duration of the COVID-19 declaration under Section 564(b)(1) of the Act, 21 U.S.C. section 360bbb-3(b)(1), unless the authorization is terminated or revoked.     Resp Syncytial Virus by PCR NEGATIVE NEGATIVE Final    Comment: (NOTE) Fact Sheet for Patients: BloggerCourse.com  Fact Sheet for Healthcare Providers: SeriousBroker.it  This test is not yet approved or cleared by the [place] FDA and has been authorized for detection and/or diagnosis of SARS-CoV-2 by FDA under an Emergency Use  Authorization (EUA). This EUA will remain in effect (meaning this test can be used) for the duration of the COVID-19 declaration under Section 564(b)(1) of the Act, 21 U.S.C. section 360bbb-3(b)(1), unless the authorization is terminated or revoked.  Performed at [facility], [address], [place], [place] [address]   Culture, blood (Routine X 2) w Reflex to ID Panel     Status: None (Preliminary result)   Collection Time: 10/03/23  5:26 AM   Specimen: BLOOD LEFT HAND  Result Value Ref Range Status   Specimen Description BLOOD LEFT HAND  Final   Special Requests   Final    BOTTLES DRAWN AEROBIC AND ANAEROBIC Blood Culture adequate volume   Culture   Final    NO GROWTH 1 DAY Performed at [facility], [address], [place], [place] [address]    Report Status PENDING  Incomplete  Culture, blood (Routine X 2) w Reflex to ID Panel     Status: None (Preliminary result)   Collection Time: 10/03/23  5:29 AM   Specimen: BLOOD RIGHT HAND  Result Value Ref Range Status   Specimen Description BLOOD RIGHT HAND  Final   Special Requests   Final    BOTTLES DRAWN AEROBIC AND ANAEROBIC Blood Culture adequate volume   Culture   Final    NO GROWTH 1 DAY Performed at [facility], [address] [place], [place] [address]    Report Status PENDING  Incomplete    Antimicrobials: Anti-infectives (From admission, onward)    Start     Dose/Rate Route Frequency Ordered Stop   10/03/23 0600  cefTRIAXone (ROCEPHIN) 2 g in sodium chloride 0.9 % 100 mL IVPB       Note to Pharmacy: Start after blood cultures drawn   2 g 200 mL/hr over 30 Minutes Intravenous Every 24 hours 10/03/23 0514     10/03/23 0600  metroNIDAZOLE (FLAGYL) tablet 500 mg        500 mg Oral Every 12 hours 10/03/23 0514        Culture/Microbiology Radiology Studies: DG CHEST PORT 1 VIEW  Result Date: 10/03/2023 CLINICAL DATA:  Fever. EXAM: PORTABLE CHEST 1 VIEW COMPARISON:  09/19/2023 FINDINGS: Right  jugular dialysis catheter with the tip near the superior cavoatrial junction. Increased densities at the left lung base could represent pleural fluid and consolidation. Heart size is upper limits of normal. Prominent central vascular markings. Negative for a pneumothorax. Patchy densities in the right lower lobe are suggestive for atelectasis. IMPRESSION: 1. New densities at the left lung base. Findings could represent pleural fluid and consolidation. Infection cannot be excluded at the left lung base. 2. Probable right basilar atelectasis. Electronically Signed   By: Richarda Overlie M.D.   On: 10/03/2023 11:53   US Abdomen Limited RUQ (LIVER/GB)  Result Date: 10/03/2023 CLINICAL DATA:  Right upper quadrant abdominal pain EXAM: ULTRASOUND ABDOMEN LIMITED RIGHT UPPER QUADRANT COMPARISON:  Abdominal CT 09/19/2023. FINDINGS: Gallbladder: Avascular sludge in the gallbladder. There is focal tenderness in a 5 mm wall thickness with moderate distension. No gallstone detected. Common bile duct: Diameter: 3 mm Liver: No focal lesion identified. Within normal limits in parenchymal echogenicity. Portal vein is patent on color Doppler imaging with normal direction of blood flow towards the liver. IMPRESSION: Tender gallbladder with mild wall thickening but no underlying gallstone. Gallbladder sludge. Electronically Signed   By: Tiburcio Pea M.D.   On: 10/03/2023 07:41     LOS: 14 days   Lanae Boast, MD [facility]  10/04/2023, 12:47 PM
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
## 2023-10-05 ENCOUNTER — Encounter (HOSPITAL_COMMUNITY): Payer: Self-pay | Admitting: *Deleted

## 2023-10-05 DIAGNOSIS — Z955 Presence of coronary angioplasty implant and graft: Secondary | ICD-10-CM

## 2023-10-05 DIAGNOSIS — N1832 Chronic kidney disease, stage 3b: Secondary | ICD-10-CM | POA: Diagnosis not present

## 2023-10-05 DIAGNOSIS — Z9861 Coronary angioplasty status: Secondary | ICD-10-CM

## 2023-10-05 DIAGNOSIS — N179 Acute kidney failure, unspecified: Secondary | ICD-10-CM | POA: Diagnosis not present

## 2023-10-05 LAB — RENAL FUNCTION PANEL
Albumin: 1.7 g/dL — ABNORMAL LOW (ref 3.5–5.0)
Anion gap: 12 (ref 5–15)
BUN: 85 mg/dL — ABNORMAL HIGH (ref 8–23)
CO2: 25 mmol/L (ref 22–32)
Calcium: 8.1 mg/dL — ABNORMAL LOW (ref 8.9–10.3)
Chloride: 100 mmol/L (ref 98–111)
Creatinine, Ser: 4.82 mg/dL — ABNORMAL HIGH (ref 0.61–1.24)
GFR, Estimated: 12 mL/min — ABNORMAL LOW (ref >60)
Glucose, Bld: 86 mg/dL (ref 70–99)
Phosphorus: 7 mg/dL — ABNORMAL HIGH (ref 2.5–4.6)
Potassium: 4.1 mmol/L (ref 3.5–5.1)
Sodium: 137 mmol/L (ref 135–145)

## 2023-10-05 LAB — CBC
HCT: 24.1 % — ABNORMAL LOW (ref 39.0–52.0)
Hemoglobin: 7.8 g/dL — ABNORMAL LOW (ref 13.0–17.0)
MCH: 29.9 pg (ref 26.0–34.0)
MCHC: 32.4 g/dL (ref 30.0–36.0)
MCV: 92.3 fL (ref 80.0–100.0)
Platelets: 394 10*3/uL (ref 150–400)
RBC: 2.61 MIL/uL — ABNORMAL LOW (ref 4.22–5.81)
RDW: 13.8 % (ref 11.5–15.5)
WBC: 15.1 10*3/uL — ABNORMAL HIGH (ref 4.0–10.5)
nRBC: 0 % (ref 0.0–0.2)
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
## 2023-10-05 NOTE — Progress Notes (Signed)
Patient ID: Jorge West, male   DOB: 1946/11/13, 77 y.o.   MRN: [id number] [facility] Surgery Progress Note     Subjective: CC-  Abdomen sore but pain is less since perc chole placement. No further n/v. Has drank some water since placement but otherwise no appetite. WBC down 15  Objective: Vital signs in last 24 hours: Temp:  [97.7 F (36.5 C)-100 F (37.8 C)] 98 F (36.7 C) (11/14 0331) Pulse Rate:  [110-137] 122 (11/14 0818) Resp:  [16-25] 18 (11/13 1900) BP: (93-145)/(69-103) 104/69 (11/14 0331) SpO2:  [89 %-100 %] 94 % (11/14 0818) Last BM Date : 09/30/23  Intake/Output from previous day: 11/13 0701 - 11/14 0700 In: 1115.8 [I.V.:838; IV Piggyback:277.8] Out: 510 [Urine:500; Drains:10] Intake/Output this shift: No intake/output data recorded.  PE: Gen:  Alert, NAD, pleasant Card:  tachy Pulm:  rate and effort normal on [place] [facility]: Soft, mild tenderness around drain, perc chole with scant bilious fluid in bag  Lab Results:  Recent Labs    10/04/23 0419 10/05/23 0512  WBC 19.7* 15.1*  HGB 8.0* 7.8*  HCT 24.9* 24.1*  PLT 402* 394   BMET Recent Labs    10/04/23 0419 10/05/23 0512  NA 136 137  K 4.0 4.1  CL 96* 100  CO2 28 25  GLUCOSE 100* 86  BUN 79* 85*  CREATININE 4.78* 4.82*  CALCIUM 8.5* 8.1*   PT/INR Recent Labs    10/04/23 1436  LABPROT 30.1*  INR 2.8*   CMP     Component Value Date/Time   NA 137 10/05/2023 0512   K 4.1 10/05/2023 0512   CL 100 10/05/2023 0512   CO2 25 10/05/2023 0512   GLUCOSE 86 10/05/2023 0512   BUN 85 (H) 10/05/2023 0512   CREATININE 4.82 (H) 10/05/2023 0512   CALCIUM 8.1 (L) 10/05/2023 0512   PROT 5.3 (L) 10/03/2023 0526   ALBUMIN 1.7 (L) 10/05/2023 0512   AST 18 10/03/2023 0526   ALT 19 10/03/2023 0526   ALKPHOS 42 10/03/2023 0526   BILITOT 0.9 10/03/2023 0526   GFRNONAA 12 (L) 10/05/2023 0512   GFRAA 43 (L) 10/25/2019 0536   Lipase  No results found for: "LIPASE"     Studies/Results: IR  Perc Cholecystostomy  Result Date: 10/04/2023 INDICATION: 77 year old male with acute calculus cholecystitis EXAM: CHOLECYSTOSTOMY MEDICATIONS: None ANESTHESIA/SEDATION: Moderate (conscious) sedation was not employed during this procedure. A total of Versed 0 mg and Fentanyl 50 mcg was administered intravenously. Moderate Sedation Time: 0 minutes. The patient's level of consciousness and vital signs were monitored continuously by radiology nursing throughout the procedure under my direct supervision. FLUOROSCOPY TIME:  Fluoroscopy Time:  (11 mGy). COMPLICATIONS: None PROCEDURE: Informed written consent was obtained from the patient and the patient's family after a thorough discussion of the procedural risks, benefits and alternatives. All questions were addressed. Maximal Sterile Barrier Technique was utilized including caps, mask, sterile gowns, sterile gloves, sterile drape, hand hygiene and skin antiseptic. A timeout was performed prior to the initiation of the procedure. Ultrasound survey of the right upper quadrant was performed for planning purposes. Once the patient is prepped and draped in the usual sterile fashion, the skin and subcutaneous tissues overlying the gallbladder were generously infiltrated 1% lidocaine for local anesthesia. A coaxial needle was advanced under ultrasound guidance through the skin subcutaneous tissues and a small segment of liver into the gallbladder lumen. With removal of the stylet, spontaneous dark bile drainage occurred. Using modified Seldinger technique, a 10 [place]  drain was placed into the gallbladder fossa, with aspiration of the sample for the lab. Contrast injection confirmed position of the tube within the gallbladder lumen. Drainage catheter was attached to gravity drain with a suture retention placed. Patient tolerated the procedure well and remained hemodynamically stable throughout. No complications were encountered and no significant blood loss encountered.  IMPRESSION: Status post percutaneous cholecystostomy Signed, Yvone Neu. Miachel Roux, RPVI [facility] Radiology Electronically Signed   By: Gilmer Mor D.O.   On: 10/04/2023 16:59   DG CHEST PORT 1 VIEW  Result Date: 10/04/2023 CLINICAL DATA:  [id number] Hypoxia [id number] EXAM: PORTABLE CHEST 1 VIEW COMPARISON:  Chest XR, 10/03/2023.  CT AP, 09/19/2023. FINDINGS: The cardiac apex is obscured. Aortic arch atherosclerosis. Hypoinflation the RIGHT lung is clear. Small volume LEFT pleural effusion with retrocardiac basilar consolidation. No pneumothorax. No acute osseous abnormality. IMPRESSION: 1. Small volume LEFT pleural effusion. 2. Dependent LEFT basilar consolidation is likely to represent atelectasis, however superimposed pneumonia can appear similar. 3.  Aortic Atherosclerosis (ICD10-I70.0). Electronically Signed   By: Roanna Banning M.D.   On: 10/04/2023 14:23   NM Hepatobiliary Liver Func  Result Date: 10/04/2023 CLINICAL DATA:  RIGHT upper quadrant pain. Concern for acalculous cholecystitis. EXAM: NUCLEAR MEDICINE HEPATOBILIARY IMAGING TECHNIQUE: Sequential images of the abdomen were obtained out to 60 minutes following intravenous administration of radiopharmaceutical. RADIOPHARMACEUTICALS:  5.4 mCi Tc-52m  Choletec IV COMPARISON:  Ultrasound 10/03/2023 FINDINGS: Prompt clearance radiotracer from blood pool and homogeneous uptake in liver. Counts are evident within the small bowel by 20 minutes. Gallbladder failed to fill over 90 minutes of imaging (extended pre morphine time of 90 minutes performed as study was ordered with ejection fraction) At 90 minutes, 3 mg of IV morphine were administered. gallbladder failed to fill after morphine augmentation. IMPRESSION: 1. Non filling of the gallbladder most consistent with acute cholecystitis. 2. Patent common duct. These results will be called to the ordering clinician or representative by the Radiologist  Assistant, and communication documented in the PACS or [employer]. Electronically Signed   By: Genevive Bi M.D.   On: 10/04/2023 12:56    Anti-infectives: Anti-infectives (From admission, onward)    Start     Dose/Rate Route Frequency Ordered Stop   10/05/23 0000  cefOXitin (MEFOXIN) 2 g in sodium chloride 0.9 % 100 mL IVPB  Status:  Discontinued        2 g 200 mL/hr over 30 Minutes Intravenous To Radiology 10/04/23 1450 10/04/23 1455   10/04/23 1545  cefOXitin (MEFOXIN) 2 g in sodium chloride 0.9 % 100 mL IVPB  Status:  Discontinued        2 g 200 mL/hr over 30 Minutes Intravenous To Radiology 10/04/23 1455 10/04/23 1718   10/03/23 0600  cefTRIAXone (ROCEPHIN) 2 g in sodium chloride 0.9 % 100 mL IVPB       Note to Pharmacy: Start after blood cultures drawn   2 g 200 mL/hr over 30 Minutes Intravenous Every 24 hours 10/03/23 0514     10/03/23 0600  metroNIDAZOLE (FLAGYL) tablet 500 mg        500 mg Oral Every 12 hours 10/03/23 0514          Assessment/Plan Acute cholecystitis  - s/p perc chole 11/13 - Continue drain and antibiotics. Culture pending.  - Ok to advance diet as tolerated.    ID - rocephin/flagyl VTE - eliquis on hold FEN - CLD Foley - none   Recent LLE  crush injury, rhabdomyolysis AKI on CKD CAD with recent PCI 07/2023 on plavix A fib on eliquis CKD HTN CHF EF 40-45%  I reviewed Consultant interventional radiology notes, hospitalist notes, last 24 h vitals and pain scores, last 48 h intake and output, last 24 h labs and trends, and last 24 h imaging results.    LOS: 15 days    Franne Forts, [facility] Surgery 10/05/2023, 8:23 AM Please see Amion for pager number during day hours 7:00am-4:30pm

## 2023-10-05 NOTE — Plan of Care (Signed)
  Problem: Nutrition: Goal: Adequate nutrition will be maintained Outcome: Progressing   Problem: Coping: Goal: Level of anxiety will decrease Outcome: Progressing   Problem: Pain Management: Goal: General experience of comfort will improve Outcome: Progressing   Problem: Safety: Goal: Ability to remain free from injury will improve Outcome: Progressing   Problem: Skin Integrity: Goal: Risk for impaired skin integrity will decrease Outcome: Progressing

## 2023-10-05 NOTE — Progress Notes (Signed)
Referring Physician(s): Dr Jonathon Bellows; Ventura Sellers, B [facility]   Supervising Physician: Roanna Banning  Patient Status:  [facility] - In-pt  Chief Complaint:   Trauma, Level 1: crush injury Percutaneous cholecystostomy drain placement  on 11/13   Subjective: Work up included u/s which shows sludge but no gallstones. HIDA obtained and is consistent with acute cholecystitis  Percutaneous cholecystostomy drain placement  on 11/13 by Dr. Mosie Epstein  Patient's pain notably improved. Drain flush and aspirate easily. Small output since placement.  Allergies: Terazosin hcl  Medications: Prior to Admission medications   Medication Sig Start Date End Date Taking? Authorizing Provider  acetaminophen (TYLENOL) 325 MG tablet Take 2 tablets (650 mg total) by mouth every 6 (six) hours as needed for mild pain (or Fever >/= 101). 06/03/19  Yes Emokpae, Courage, MD  ALPRAZolam (XANAX) 0.5 MG tablet Take 0.5 mg by mouth 2 (two) times daily as needed for anxiety.   Yes [provider]  amLODipine (NORVASC) 10 MG tablet Take 10 mg by mouth daily. 08/19/23  Yes [provider]  apixaban (ELIQUIS) 5 MG TABS tablet Take 1 tablet (5 mg total) by mouth 2 (two) times daily. Blood thinner for stroke prevention 06/03/19  Yes Emokpae, Courage, MD  brimonidine-timolol (COMBIGAN) 0.2-0.5 % ophthalmic solution Place 1 drop into the left eye daily. 02/29/16  Yes [provider]  buPROPion (WELLBUTRIN XL) 300 MG 24 hr tablet Take 300 mg by mouth daily.   Yes [provider]  Cholecalciferol (VITAMIN D3) 3000 UNITS TABS Take 1 tablet by mouth daily.    Yes [provider]  clopidogrel (PLAVIX) 75 MG tablet Take 75 mg by mouth daily.   Yes [provider]  cyanocobalamin (VITAMIN B12) 100 MCG tablet Take 100 mcg by mouth daily.   Yes [provider]  escitalopram (LEXAPRO) 10 MG tablet Take 10 mg by mouth daily.   Yes [provider]  metoprolol succinate (TOPROL-XL) 25 MG  24 hr tablet Take 25 mg by mouth daily. 08/22/23  Yes [provider]  nitroGLYCERIN (NITROSTAT) 0.4 MG SL tablet Place 0.4 mg under the tongue every 5 (five) minutes as needed for chest pain.   Yes [provider]  Omega-3 Fatty Acids (FISH OIL PO) Take 1 capsule by mouth daily.    Yes [provider]  pantoprazole (PROTONIX) 40 MG tablet Take 40 mg by mouth daily.   Yes [provider]  rosuvastatin (CRESTOR) 40 MG tablet Take 40 mg by mouth daily.   Yes [provider]  sacubitril-valsartan (ENTRESTO) 24-26 MG Take 1 tablet by mouth daily.   Yes [provider]  vitamin C (ASCORBIC ACID) 500 MG tablet Take 1 tablet (500 mg total) by mouth daily. 09/25/12  Yes Bradly Bienenstock, MD     Vital Signs: BP 104/69 (BP Location: Right Arm)   Pulse (!) 119   Temp 98 F (36.7 C) (Oral)   Resp 18   Ht 5\' 10"  (1.778 m)   Wt 210 lb 12.2 oz (95.6 kg)   SpO2 92%   BMI 30.24 kg/m   Physical Exam Abdominal:     Tenderness: There is no abdominal tenderness.  Skin:    General: Skin is warm and dry.  Neurological:     Mental Status: He is oriented to person, place, and time.  Psychiatric:        Behavior: Behavior normal.     Imaging: IR Perc Cholecystostomy  Result Date: 10/04/2023 INDICATION: 77 year old male with  acute calculus cholecystitis EXAM: CHOLECYSTOSTOMY MEDICATIONS: None ANESTHESIA/SEDATION: Moderate (conscious) sedation was not employed during this procedure. A total of Versed 0 mg and Fentanyl 50 mcg was administered intravenously. Moderate Sedation Time: 0 minutes. The patient's level of consciousness and vital signs were monitored continuously by radiology nursing throughout the procedure under my direct supervision. FLUOROSCOPY TIME:  Fluoroscopy Time:  (11 mGy). COMPLICATIONS: None PROCEDURE: Informed written consent was obtained from the patient and the patient's family after a thorough discussion of the procedural risks,  benefits and alternatives. All questions were addressed. Maximal Sterile Barrier Technique was utilized including caps, mask, sterile gowns, sterile gloves, sterile drape, hand hygiene and skin antiseptic. A timeout was performed prior to the initiation of the procedure. Ultrasound survey of the right upper quadrant was performed for planning purposes. Once the patient is prepped and draped in the usual sterile fashion, the skin and subcutaneous tissues overlying the gallbladder were generously infiltrated 1% lidocaine for local anesthesia. A coaxial needle was advanced under ultrasound guidance through the skin subcutaneous tissues and a small segment of liver into the gallbladder lumen. With removal of the stylet, spontaneous dark bile drainage occurred. Using modified Seldinger technique, a 10 French drain was placed into the gallbladder fossa, with aspiration of the sample for the lab. Contrast injection confirmed position of the tube within the gallbladder lumen. Drainage catheter was attached to gravity drain with a suture retention placed. Patient tolerated the procedure well and remained hemodynamically stable throughout. No complications were encountered and no significant blood loss encountered. IMPRESSION: Status post percutaneous cholecystostomy Signed, Yvone Neu. Miachel Roux, RPVI [facility] Electronically Signed   By: Gilmer Mor D.O.   On: 10/04/2023 16:59   DG CHEST PORT 1 VIEW  Result Date: 10/04/2023 CLINICAL DATA:  200808 Hypoxia [id number] EXAM: PORTABLE CHEST 1 VIEW COMPARISON:  Chest XR, 10/03/2023.  CT AP, 09/19/2023. FINDINGS: The cardiac apex is obscured. Aortic arch atherosclerosis. Hypoinflation the RIGHT lung is clear. Small volume LEFT pleural effusion with retrocardiac basilar consolidation. No pneumothorax. No acute osseous abnormality. IMPRESSION: 1. Small volume LEFT pleural effusion. 2. Dependent LEFT basilar  consolidation is likely to represent atelectasis, however superimposed pneumonia can appear similar. 3.  Aortic Atherosclerosis (ICD10-I70.0). Electronically Signed   By: Roanna Banning M.D.   On: 10/04/2023 14:23   NM Hepatobiliary Liver Func  Result Date: 10/04/2023 CLINICAL DATA:  RIGHT upper quadrant pain. Concern for acalculous cholecystitis. EXAM: NUCLEAR MEDICINE HEPATOBILIARY IMAGING TECHNIQUE: Sequential images of the abdomen were obtained out to 60 minutes following intravenous administration of radiopharmaceutical. RADIOPHARMACEUTICALS:  5.4 mCi Tc-16m  Choletec IV COMPARISON:  Ultrasound 10/03/2023 FINDINGS: Prompt clearance radiotracer from blood pool and homogeneous uptake in liver. Counts are evident within the small bowel by 20 minutes. Gallbladder failed to fill over 90 minutes of imaging (extended pre morphine time of 90 minutes performed as study was ordered with ejection fraction) At 90 minutes, 3 mg of IV morphine were administered. gallbladder failed to fill after morphine augmentation. IMPRESSION: 1. Non filling of the gallbladder most consistent with acute cholecystitis. 2. Patent common duct. These results will be called to the ordering clinician or representative by the Radiologist Assistant, and communication documented in the PACS or [employer]. Electronically Signed   By: Genevive Bi M.D.   On: 10/04/2023 12:56   DG CHEST PORT 1 VIEW  Result Date: 10/03/2023 CLINICAL DATA:  Fever. EXAM: PORTABLE CHEST 1 VIEW COMPARISON:  09/19/2023 FINDINGS: Right jugular dialysis  catheter with the tip near the superior cavoatrial junction. Increased densities at the left lung base could represent pleural fluid and consolidation. Heart size is upper limits of normal. Prominent central vascular markings. Negative for a pneumothorax. Patchy densities in the right lower lobe are suggestive for atelectasis. IMPRESSION: 1. New densities at the left lung base. Findings could represent  pleural fluid and consolidation. Infection cannot be excluded at the left lung base. 2. Probable right basilar atelectasis. Electronically Signed   By: Richarda Overlie M.D.   On: 10/03/2023 11:53   US Abdomen Limited RUQ (LIVER/GB)  Result Date: 10/03/2023 CLINICAL DATA:  Right upper quadrant abdominal pain EXAM: ULTRASOUND ABDOMEN LIMITED RIGHT UPPER QUADRANT COMPARISON:  Abdominal CT 09/19/2023. FINDINGS: Gallbladder: Avascular sludge in the gallbladder. There is focal tenderness in a 5 mm wall thickness with moderate distension. No gallstone detected. Common bile duct: Diameter: 3 mm Liver: No focal lesion identified. Within normal limits in parenchymal echogenicity. Portal vein is patent on color Doppler imaging with normal direction of blood flow towards the liver. IMPRESSION: Tender gallbladder with mild wall thickening but no underlying gallstone. Gallbladder sludge. Electronically Signed   By: Tiburcio Pea M.D.   On: 10/03/2023 07:41    Labs:  CBC: Recent Labs    09/30/23 0441 10/03/23 0415 10/04/23 0419 10/05/23 0512  WBC 8.4 20.1* 19.7* 15.1*  HGB 8.1* 8.5* 8.0* 7.8*  HCT 24.1* 25.2* 24.9* 24.1*  PLT 316 374 402* 394    COAGS: Recent Labs    09/19/23 1912 10/04/23 1436  INR 1.5* 2.8*    BMP: Recent Labs    10/02/23 0515 10/03/23 0422 10/04/23 0419 10/05/23 0512  NA 134* 135 136 137  K 3.5 3.6 4.0 4.1  CL 96* 95* 96* 100  CO2 29 29 28 25   GLUCOSE 115* 111* 100* 86  BUN 63* 71* 79* 85*  CALCIUM 8.5* 8.6* 8.5* 8.1*  CREATININE 4.98* 4.69* 4.78* 4.82*  GFRNONAA 11* 12* 12* 12*    LIVER FUNCTION TESTS: Recent Labs    09/21/23 0428 09/22/23 0527 09/23/23 0500 09/25/23 0328 09/26/23 0536 10/03/23 0422 10/03/23 0526 10/04/23 0419 10/05/23 0512  BILITOT 0.6 0.6  --  1.3*  --   --  0.9  --   --   AST 233* 198*  --  104*  --   --  18  --   --   ALT 47* 45*  --  37  --   --  19  --   --   ALKPHOS 31* 36*  --  35*  --   --  42  --   --   PROT 4.8* 4.7*  --   4.3*  --   --  5.3*  --   --   ALBUMIN 2.2* 2.2*   < > 1.7*   < > 2.0* 2.0* 1.8* 1.7*   < > = values in this interval not displayed.    Drain Location: RUQ Size: Fr size: 10 Fr Date of placement: 10/04/23  Currently to: Drain collection device: gravity 24 hour output:  Output by Drain (mL) 10/03/23 0701 - 10/03/23 1900 10/03/23 1901 - 10/04/23 0700 10/04/23 0701 - 10/04/23 1900 10/04/23 1901 - 10/05/23 0700 10/05/23 0701 - 10/05/23 0731  Closed System Drain 1 Right;Lateral Abdomen Other (Comment) 10 Fr.   5 5     Interval imaging/drain manipulation:  None  Current examination: Mild output, mainly bloody. Flushes/aspirates easily.  Insertion site unremarkable. Suture and stat lock in place.  Dressed appropriately.   Plan: Continue TID flushes with 5 cc NS. Record output Q shift. Dressing changes QD or PRN if soiled.   Discharge planning: Please contact IR APP or on call IR MD prior to patient d/c to ensure appropriate follow up plans are in place. Typically patient will follow up with IR clinic 6-8 weeks post d/c for repeat imaging/possible drain injection. IR scheduler will contact patient with date/time of appointment. Patient will need to flush drain QD with 5 cc NS, record output QD, dressing changes every 2-3 days or earlier if soiled.   IR will continue to follow - please call with questions or concerns.    Electronically Signed: Sable Feil, PA-C 10/05/2023, 7:32 AM   I spent a total of 15 Minutes at the the patient's bedside AND on the patient's hospital floor or unit, greater than 50% of which was counseling/coordinating care for percutaneous cholecystostomy drain

## 2023-10-05 NOTE — Progress Notes (Signed)
[facility] Progress Note   Subjective:   I/Os yest 1.1 / 0.5.    Feels a bit better after perc chole tube placed yesterday.  PO intake has been poor but diet is being advanced. Afebrile, WBC down 20 to 15k. Cr stabe at 4.8.   Objective Vitals:   10/04/23 2300 10/05/23 0331 10/05/23 0818 10/05/23 0830  BP: 98/73 104/69  101/69  Pulse: (!) 110 (!) 119 (!) 122 (!) 114  Resp:    20  Temp: 98.2 F (36.8 C) 98 F (36.7 C)  98.2 F (36.8 C)  TempSrc: Oral Oral  Oral  SpO2: 91% 92% 94% 94%  Weight:      Height:       Physical Exam GEN: sitting in chair - 3 PTs helping to get him back to bed ENT: MM tacky  CV: Tachycardic, a fib on monitor PULM: no iwob, bilateral chest rise, dec BS bases, no rales - on 3L [place] ABD: RUQ bilious drain SKIN: Abrasions covered with gauze, otherwise no rashes or jaundice EXT: LLE edema with trauma; brace on LLE, RLE no edema  Additional Objective Labs: Basic Metabolic Panel: Recent Labs  Lab 10/03/23 0422 10/04/23 0419 10/05/23 0512  NA 135 136 137  K 3.6 4.0 4.1  CL 95* 96* 100  CO2 29 28 25   GLUCOSE 111* 100* 86  BUN 71* 79* 85*  CREATININE 4.69* 4.78* 4.82*  CALCIUM 8.6* 8.5* 8.1*  PHOS 5.0* 6.0* 7.0*   Liver Function Tests: Recent Labs  Lab 10/03/23 0526 10/04/23 0419 10/05/23 0512  AST 18  --   --   ALT 19  --   --   ALKPHOS 42  --   --   BILITOT 0.9  --   --   PROT 5.3*  --   --   ALBUMIN 2.0* 1.8* 1.7*   No results for input(s): "LIPASE", "AMYLASE" in the last 168 hours. CBC: Recent Labs  Lab 09/29/23 0510 09/30/23 0441 10/03/23 0415 10/04/23 0419 10/05/23 0512  WBC 8.2 8.4 20.1* 19.7* 15.1*  HGB 8.4* 8.1* 8.5* 8.0* 7.8*  HCT 24.5* 24.1* 25.2* 24.9* 24.1*  MCV 87.5 89.3 89.7 91.5 92.3  PLT 279 316 374 402* 394   Blood Culture    Component Value Date/Time   SDES GALL BLADDER 10/04/2023 1610   SPECREQUEST NONE 10/04/2023 1610   CULT  10/04/2023 1610    CULTURE REINCUBATED FOR BETTER  GROWTH Performed at [facility], [address], [place], [place] [address]    REPTSTATUS PENDING 10/04/2023 1610    Cardiac Enzymes: No results for input(s): "CKTOTAL", "CKMB", "CKMBINDEX", "TROPONINI" in the last 168 hours.  CBG: No results for input(s): "GLUCAP" in the last 168 hours. Iron Studies: No results for input(s): "IRON", "TIBC", "TRANSFERRIN", "FERRITIN" in the last 72 hours. @lablastinr3 @ Studies/Results: IR Perc Cholecystostomy  Result Date: 10/04/2023 INDICATION: 77 year old male with acute calculus cholecystitis EXAM: CHOLECYSTOSTOMY MEDICATIONS: None ANESTHESIA/SEDATION: Moderate (conscious) sedation was not employed during this procedure. A total of Versed 0 mg and Fentanyl 50 mcg was administered intravenously. Moderate Sedation Time: 0 minutes. The patient's level of consciousness and vital signs were monitored continuously by radiology nursing throughout the procedure under my direct supervision. FLUOROSCOPY TIME:  Fluoroscopy Time:  (11 mGy). COMPLICATIONS: None PROCEDURE: Informed written consent was obtained from the patient and the patient's family after a thorough discussion of the procedural risks, benefits and alternatives. All questions were addressed. Maximal Sterile Barrier Technique was utilized including caps,  mask, sterile gowns, sterile gloves, sterile drape, hand hygiene and skin antiseptic. A timeout was performed prior to the initiation of the procedure. Ultrasound survey of the right upper quadrant was performed for planning purposes. Once the patient is prepped and draped in the usual sterile fashion, the skin and subcutaneous tissues overlying the gallbladder were generously infiltrated 1% lidocaine for local anesthesia. A coaxial needle was advanced under ultrasound guidance through the skin subcutaneous tissues and a small segment of liver into the gallbladder lumen. With removal of the stylet, spontaneous dark bile drainage occurred. Using modified  Seldinger technique, a 10 French drain was placed into the gallbladder fossa, with aspiration of the sample for the lab. Contrast injection confirmed position of the tube within the gallbladder lumen. Drainage catheter was attached to gravity drain with a suture retention placed. Patient tolerated the procedure well and remained hemodynamically stable throughout. No complications were encountered and no significant blood loss encountered. IMPRESSION: Status post percutaneous cholecystostomy Signed, Yvone Neu. Miachel Roux, RPVI [facility] Electronically Signed   By: Gilmer Mor D.O.   On: 10/04/2023 16:59   DG CHEST PORT 1 VIEW  Result Date: 10/04/2023 CLINICAL DATA:  200808 Hypoxia [id number] EXAM: PORTABLE CHEST 1 VIEW COMPARISON:  Chest XR, 10/03/2023.  CT AP, 09/19/2023. FINDINGS: The cardiac apex is obscured. Aortic arch atherosclerosis. Hypoinflation the RIGHT lung is clear. Small volume LEFT pleural effusion with retrocardiac basilar consolidation. No pneumothorax. No acute osseous abnormality. IMPRESSION: 1. Small volume LEFT pleural effusion. 2. Dependent LEFT basilar consolidation is likely to represent atelectasis, however superimposed pneumonia can appear similar. 3.  Aortic Atherosclerosis (ICD10-I70.0). Electronically Signed   By: Roanna Banning M.D.   On: 10/04/2023 14:23   NM Hepatobiliary Liver Func  Result Date: 10/04/2023 CLINICAL DATA:  RIGHT upper quadrant pain. Concern for acalculous cholecystitis. EXAM: NUCLEAR MEDICINE HEPATOBILIARY IMAGING TECHNIQUE: Sequential images of the abdomen were obtained out to 60 minutes following intravenous administration of radiopharmaceutical. RADIOPHARMACEUTICALS:  5.4 mCi Tc-74m  Choletec IV COMPARISON:  Ultrasound 10/03/2023 FINDINGS: Prompt clearance radiotracer from blood pool and homogeneous uptake in liver. Counts are evident within the small bowel by 20 minutes. Gallbladder failed to  fill over 90 minutes of imaging (extended pre morphine time of 90 minutes performed as study was ordered with ejection fraction) At 90 minutes, 3 mg of IV morphine were administered. gallbladder failed to fill after morphine augmentation. IMPRESSION: 1. Non filling of the gallbladder most consistent with acute cholecystitis. 2. Patent common duct. These results will be called to the ordering clinician or representative by the Radiologist Assistant, and communication documented in the PACS or [employer]. Electronically Signed   By: Genevive Bi M.D.   On: 10/04/2023 12:56   Medications:  sodium chloride 50 mL/hr at 10/05/23 0630   cefTRIAXone (ROCEPHIN)  IV Stopped (10/05/23 0547)    amiodarone  200 mg Oral BID   brimonidine  1 drop Left Eye Daily   And   timolol  1 drop Left Eye Daily   buPROPion  300 mg Oral Daily   Chlorhexidine Gluconate Cloth  6 each Topical Daily   cholecalciferol  1,000 Units Oral Daily   clopidogrel  75 mg Oral Daily   escitalopram  10 mg Oral Daily   feeding supplement  1 Container Oral TID BM   metoprolol succinate  25 mg Oral Daily   midodrine  10 mg Oral TID WC   multivitamin  1 tablet Oral QHS  sodium chloride flush  3 mL Intravenous Q12H   sodium chloride flush  5 mL Intracatheter Q8H   cyanocobalamin  100 mcg Oral Daily    Assessment/Plan: **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 -HD catheter removed 11/12 in setting of improving nonoliguric AKI (hadn't been used since 11/5) + fever -Renal function was slightly worse yesterday and stable today  in setting of some modest hypotension and fever --> no need to reinsert HD catheter yet but certainly at risk. Discussed w family -volume status mildly intravasc dry - on MIVF - agree for now but low threshold to d/c if ^ O2 requirement, can always use high dose lasix  PRN **Traumatic rhabdomyolysis, CK levels over all improved **Acute hypoxemic respiratory failure, on 3L [place] now and +4.5L per I/Os - 11/13 CXR R small/mod pleural effusion.  If increasing hypoxia would repeat CXR **Permanent atrial fibrillation with RVR, followed by cardiology, on amiodarone.  Apixaban 5 mg BID.  Due to ongoing tachycardia and low BPs cardiology re consulted 11/11 and increased amio - options limited and they've signed off **CAD with PCI placed about 2 months ago, per cardiology. Plavix 75 mg daily.   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  **Closed left femoral condyle avulsion fracture, tibial plateau fracture, and brace currently, orthopedics following. **Hypertension: hypotensive in hospital - on midodrine TID now. Holding Entresto for AKI. A fib mgmt per above.  BPs have been ok in the past 24h. **Anemia: Continue under transfuse as needed.  Multifactorial **Acute cholecystitis: febrile 11/11, blood cultures neg, scan c/w acute chole now s/p drain 11/13 radiology.  Surgery following, rec advance diet.   Will continue to follow.      Estill Bakes MD 10/05/2023, 10:11 AM  [facility] Pager: [phone]

## 2023-10-05 NOTE — Progress Notes (Signed)
PROGRESS NOTE Jorge West  XBJ:[id number] DOB: 10/16/1946 DOA: 09/19/2023 PCP: Elizabeth Palau, FNP  Brief Narrative/Hospital Course: 77 year old with history of CHF, A-fib, CAD, HTN, CKD, BPH presenting with left lower extremity trauma Anteroapical and then falling on the floor. Apparently was trapped for about 2 hours until family found him. Upon admission CT head, cervical spine negative, CT chest abdomen pelvis showed acute T12 compression fracture, CT of lower extremity showed asymmetric enlargement of muscle on the left.  Patient was admitted for left femoral condyle avulsion fracture, severe rhabdomyolysis and AKI on CKD. Patient started on dialysis.  Nephrology is following closely.  Patient had fever episode along with right upper quadrant abdominal pain, [place] 11/12-mild wall thickening gallbladder but no gallstone, placed on antibiotics HIDA scan obtained 11/13> was positive for acute cholecystitis-CCS was consulted, given his comorbidities felt to be high risk for surgical intervention IR consulted and cholecystostomy tube placed 11/13.     Subjective: Seen this am On bedside chair Overnight patient afebrile heart rate in 100-1 20s, BP 104  on 2l [place]. Following cholecystostomy tube placement abdominal pain has " gone" he says Labs shows WBC nicely downtrending 15.1 hemoglobin at 7.8 creatinine further up 4.8   Assessment and Plan: Principal Problem:   Acute kidney injury superimposed on stage 3b chronic kidney disease (HCC) Active Problems:   Atrial fibrillation with RVR (HCC)   Essential hypertension   Closed left femoral condyle avulsion fracture (HCC)   Coronary artery disease   Anemia of chronic disease   Malnutrition of moderate degree   Persistent atrial fibrillation (HCC)  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  Right internal jugular tunneled HD cath 11/1- removed 11/12 Due to his poor oral  intake given acute cholecystitis placed on gentle IV fluid hydration - wean off ivf as able to take po.  Appreciate nephrology input creatinine remains elevated  UOP at 410 cc Recent Labs    09/26/23 0536 09/27/23 0422 09/28/23 0520 09/29/23 0510 09/30/23 0441 10/01/23 0500 10/02/23 0515 10/03/23 0422 10/04/23 0419 10/05/23 0512  BUN 65* 53* 61* 69* 69* 68* 63* 71* 79* 85*  CREATININE 7.23* 5.93* 6.17* 6.48* 6.34* 5.66* 4.98* 4.69* 4.78* 4.82*  CO2 30 29 29 29 28 28 29 29 28  25  K 3.2* 3.3* 3.6 3.4* 3.8 3.8 3.5 3.6 4.0 4.1   Intake/Output Summary (Last 24 hours) at 10/05/2023 0943 Last data filed at 10/05/2023 0630 Gross per 24 hour  Intake 1115.82 ml  Output 410 ml  Net 705.82 ml     Traumatic rhabdomyolysis: CK has improved overall continue oral hydration  Acute cholecystitis: Patient had fever episode along with abdominal pain, leukocytosis> [place] 11/12-mild wall thickening gallbladder but no gallstone, placed on antibiotics HIDA scan obtained 11/13> was positive for acute cholecystitis-CCS was consulted, given his comorbidities felt to be high risk for surgical intervention IR consulted and cholecystostomy tube placed 11/13.  Gram stain from the tube gram-negative rods, blood culture 11/2 NGTD pain improvied and WBC downtrending, afebrile. Continue ceftriaxone alone, dc Flagyl.  Diet per CCS ok for ADAT Recent Labs  Lab 09/29/23 0510 09/30/23 0441 10/03/23 0415 10/03/23 0526 10/04/23 0419 10/05/23 0512  WBC 8.2 8.4 20.1*  --  19.7* 15.1*  LATICACIDVEN  --   --   --  0.9  --   --     Hypokalemia: Resolved  Permanent atrial fibrillation with RVR: Heart rate remains borderline controlled.Continue amiodarone, Toprol-XL.  Eliquis has been held in the setting of periprocedure monitor hemoglobin and will resume once okay with IR and CCS team.Per cardio okay to hold Eliquis and no need for bridge. Blood pressure had been soft limiting the titration of beta-blocker.  CAD w/  PCI placed 2 months ago: No chest pain.  On Plavix.  Essential hypertension: BP had been soft needing IV fluid boluses 500 cc x2, on gentle ivf.holding Entresto. cont midodrine  Scrotal edema Elevate   Acute hypoxic respiratory failure: Multifactorial in the setting of CHF, and acute cholecystitis and abdominal pain, continue supplement oxygen, encourage I-S PT OT   Acute on chronic diastolic CHF: TTE- LVEF:60- to 65%, mild LVH, RV with normal systolic function,no significant valvular disease. Cont metoprolol. Wean off ivf as able to eat.Avoiding ARB/ARNI due to AKI  Net IO Since Admission: 5,943.96 mL [10/05/23 0943]   Closed left femoral condyle avulsion fracture T12 compression fracture: Continue PT OT and pain control.  Seen by orthopedics WBAT and follow-up outpatient with Dr. Zoe Lan need reconstructive surgery down the road but no acute surgical recommendation advised this admission   Anemia of chronic disease Hemoglobin fluctuating, remains high risk for bleeding given his Eliquis use and needing urgent procedure w/ IR cholecystostomy tube.  Transfuse if less than 7 g, monitor closely  Recent Labs  Lab 09/29/23 0510 09/30/23 0441 10/03/23 0415 10/04/23 0419 10/05/23 0512  HGB 8.4* 8.1* 8.5* 8.0* 7.8*  HCT 24.5* 24.1* 25.2* 24.9* 24.1*    Malnutrition of moderate degree Continue nutritional supplements Nutrition Problem: Moderate Malnutrition Etiology: chronic illness (heart failure, afib) Signs/Symptoms: mild fat depletion, mild muscle depletion Interventions: Ensure Enlive (each supplement provides 350kcal and 20 grams of protein), MVI, Magic cup, Education  .  Obesity:Patient's Body mass index is 30.24 kg/m. : Will benefit with PCP follow-up, weight loss  healthy lifestyle and outpatient sleep evaluation.  DVT prophylaxis: SCDs Start: 09/20/23 0018 Place TED hose Start: 09/20/23 0018 eliquis Code Status:   Code Status: Full Code Family Communication: plan of  care discussed with patient/wife updated at bedside  in details Patient status is: Inpatient because of AKI Level of care: Telemetry Cardiac   Dispo: The patient is from: home w/ wife            Anticipated disposition: TBD Objective: Vitals last 24 hrs: Vitals:   10/04/23 2300 10/05/23 0331 10/05/23 0818 10/05/23 0830  BP: 98/73 104/69  101/69  Pulse: (!) 110 (!) 119 (!) 122 (!) 114  Resp:    20  Temp: 98.2 F (36.8 C) 98 F (36.7 C)  98.2 F (36.8 C)  TempSrc: Oral Oral  Oral  SpO2: 91% 92% 94% 94%  Weight:      Height:       Weight change:   Physical Examination: General exam: alert awake,WEAK ,FRAIL HEENT:Oral mucosa moist, Ear/Nose WNL grossly Respiratory system: Bilaterally clear BS,no use of accessory muscle Cardiovascular system: S1 & S2 +, No JVD. Gastrointestinal system: Abdomen soft, obese RUQ drain+,ND, BS+ Nervous System: Alert, awake, moving all extremities but  has generalized weakness. Extremities: LE edema neg,distal peripheral pulses palpable and warm.  Skin: No rashes,no icterus. MSK: Normal muscle bulk,tone, power   Medications reviewed:  Scheduled Meds:  amiodarone  200 mg Oral BID   brimonidine  1 drop Left Eye Daily   And   timolol  1 drop Left Eye Daily   buPROPion  300 mg Oral Daily   Chlorhexidine Gluconate Cloth  6 each Topical Daily  cholecalciferol  1,000 Units Oral Daily   clopidogrel  75 mg Oral Daily   escitalopram  10 mg Oral Daily   feeding supplement  1 Container Oral TID BM   metoprolol succinate  25 mg Oral Daily   midodrine  10 mg Oral TID WC   multivitamin  1 tablet Oral QHS   sodium chloride flush  3 mL Intravenous Q12H   sodium chloride flush  5 mL Intracatheter Q8H   cyanocobalamin  100 mcg Oral Daily   Continuous Infusions:  sodium chloride 50 mL/hr at 10/05/23 0630   cefTRIAXone (ROCEPHIN)  IV Stopped (10/05/23 0547)      Diet Order             Diet clear liquid Room service appropriate? Yes; Fluid  consistency: Thin  Diet effective now                   Intake/Output Summary (Last 24 hours) at 10/05/2023 0943 Last data filed at 10/05/2023 0630 Gross per 24 hour  Intake 1115.82 ml  Output 410 ml  Net 705.82 ml   Net IO Since Admission: 5,943.96 mL [10/05/23 0943]  Wt Readings from Last 3 Encounters:  10/04/23 95.6 kg  09/05/23 84.4 kg  01/31/21 89.4 kg     Unresulted Labs (From admission, onward)     Start     Ordered   10/04/23 0909  Urine Culture (for pregnant, neutropenic or urologic patients or patients with an indwelling urinary catheter)  (Urine Labs)  Add-on,   AD       Question:  Indication  Answer:  Dysuria   10/04/23 0908   10/03/23 0500  CBC  Daily,   R     Question:  Specimen collection method  Answer:  Unit=Unit collect   10/02/23 0859   10/02/23 0500  Renal function panel  Daily,   R     Question:  Specimen collection method  Answer:  Unit=Unit collect   10/01/23 0753   09/19/23 2017  Prepare fresh frozen plasma  (**Emergency Blood Administration**)  ONCE - STAT,   STAT       Comments: Emergent release   Question Answer Comment  # of Units 2 units   Special Requirements Other (Specify)   Emergent release call blood bank [facility] [phone]   Attestation: Emergent transfusion, the ordering licensed practitioner has determined that the risk of delaying transfusion outweighs the risk of transfusing incompatible or incompletely tested units.     Placed in "And" Linked Group   09/19/23 2016          Data Reviewed: I have personally reviewed following labs and imaging studies CBC: Recent Labs  Lab 09/29/23 0510 09/30/23 0441 10/03/23 0415 10/04/23 0419 10/05/23 0512  WBC 8.2 8.4 20.1* 19.7* 15.1*  HGB 8.4* 8.1* 8.5* 8.0* 7.8*  HCT 24.5* 24.1* 25.2* 24.9* 24.1*  MCV 87.5 89.3 89.7 91.5 92.3  PLT 279 316 374 402* 394   Basic Metabolic Panel: Recent Labs  Lab 09/30/23 0441 10/01/23 0410 10/01/23 0500 10/02/23 0515 10/03/23 0415  10/03/23 0422 10/04/23 0419 10/05/23 0512  NA 138  --  137 134*  --  135 136 137  K 3.8  --  3.8 3.5  --  3.6 4.0 4.1  CL 99  --  97* 96*  --  95* 96* 100  CO2 28  --  28 29  --  29 28 25   GLUCOSE 89  --  123* 115*  --  111* 100* 86  BUN 69*  --  68* 63*  --  71* 79* 85*  CREATININE 6.34*  --  5.66* 4.98*  --  4.69* 4.78* 4.82*  CALCIUM 8.6*  --  8.6* 8.5*  --  8.6* 8.5* 8.1*  MG 2.0 2.0  --  1.9 1.8  --  1.9  --   PHOS  --   --  5.8* 5.4*  --  5.0* 6.0* 7.0*   GFR: Estimated Creatinine Clearance: 14.9 mL/min (A) (by C-G formula based on SCr of 4.82 mg/dL (H)). Liver Function Tests: Recent Labs  Lab 10/02/23 0515 10/03/23 0422 10/03/23 0526 10/04/23 0419 10/05/23 0512  AST  --   --  18  --   --   ALT  --   --  19  --   --   ALKPHOS  --   --  42  --   --   BILITOT  --   --  0.9  --   --   PROT  --   --  5.3*  --   --   ALBUMIN 2.0* 2.0* 2.0* 1.8* 1.7*   Recent Results (from the past 240 hour(s))  Resp panel by RT-PCR (RSV, Flu A&B, Covid) Anterior Nasal Swab     Status: None   Collection Time: 10/03/23  5:18 AM   Specimen: Anterior Nasal Swab  Result Value Ref Range Status   SARS Coronavirus 2 by RT PCR NEGATIVE NEGATIVE Final   Influenza A by PCR NEGATIVE NEGATIVE Final   Influenza B by PCR NEGATIVE NEGATIVE Final    Comment: (NOTE) The Xpert Xpress SARS-CoV-2/FLU/RSV plus assay is intended as an aid in the diagnosis of influenza from Nasopharyngeal swab specimens and should not be used as a sole basis for treatment. Nasal washings and aspirates are unacceptable for Xpert Xpress SARS-CoV-2/FLU/RSV testing.  Fact Sheet for Patients: BloggerCourse.com  Fact Sheet for Healthcare Providers: SeriousBroker.it  This test is not yet approved or cleared by the [place] FDA and has been authorized for detection and/or diagnosis of SARS-CoV-2 by FDA under an Emergency Use Authorization (EUA). This EUA will remain in  effect (meaning this test can be used) for the duration of the COVID-19 declaration under Section 564(b)(1) of the Act, 21 U.S.C. section 360bbb-3(b)(1), unless the authorization is terminated or revoked.     Resp Syncytial Virus by PCR NEGATIVE NEGATIVE Final    Comment: (NOTE) Fact Sheet for Patients: BloggerCourse.com  Fact Sheet for Healthcare Providers: SeriousBroker.it  This test is not yet approved or cleared by the [place] FDA and has been authorized for detection and/or diagnosis of SARS-CoV-2 by FDA under an Emergency Use Authorization (EUA). This EUA will remain in effect (meaning this test can be used) for the duration of the COVID-19 declaration under Section 564(b)(1) of the Act, 21 U.S.C. section 360bbb-3(b)(1), unless the authorization is terminated or revoked.  Performed at [facility], [address] [place], [place] [address]   Culture, blood (Routine X 2) w Reflex to ID Panel     Status: None (Preliminary result)   Collection Time: 10/03/23  5:26 AM   Specimen: BLOOD LEFT HAND  Result Value Ref Range Status   Specimen Description BLOOD LEFT HAND  Final   Special Requests   Final    BOTTLES DRAWN AEROBIC AND ANAEROBIC Blood Culture adequate volume   Culture   Final    NO GROWTH 2 DAYS Performed at [facility], [address] [place], [place] [address]    Report Status PENDING  Incomplete  Culture, blood (Routine X 2) w Reflex to ID Panel     Status: None (Preliminary result)   Collection Time: 10/03/23  5:29 AM   Specimen: BLOOD RIGHT HAND  Result Value Ref Range Status   Specimen Description BLOOD RIGHT HAND  Final   Special Requests   Final    BOTTLES DRAWN AEROBIC AND ANAEROBIC Blood Culture adequate volume   Culture   Final    NO GROWTH 2 DAYS Performed at [facility], [address], [place], [place] [address]    Report Status PENDING  Incomplete  Aerobic/Anaerobic  Culture w Gram Stain (surgical/deep wound)     Status: None (Preliminary result)   Collection Time: 10/04/23  4:10 PM   Specimen: Gallbladder; Bile  Result Value Ref Range Status   Specimen Description GALL BLADDER  Final   Special Requests NONE  Final   Gram Stain   Final    RARE WBC PRESENT, PREDOMINANTLY PMN FEW GRAM NEGATIVE RODS    Culture   Final    CULTURE REINCUBATED FOR BETTER GROWTH Performed at [facility], [address], [place], [place] [address]    Report Status PENDING  Incomplete    Antimicrobials: Anti-infectives (From admission, onward)    Start     Dose/Rate Route Frequency Ordered Stop   10/05/23 0000  cefOXitin (MEFOXIN) 2 g in sodium chloride 0.9 % 100 mL IVPB  Status:  Discontinued        2 g 200 mL/hr over 30 Minutes Intravenous To Radiology 10/04/23 1450 10/04/23 1455   10/04/23 1545  cefOXitin (MEFOXIN) 2 g in sodium chloride 0.9 % 100 mL IVPB  Status:  Discontinued        2 g 200 mL/hr over 30 Minutes Intravenous To Radiology 10/04/23 1455 10/04/23 1718   10/03/23 0600  cefTRIAXone (ROCEPHIN) 2 g in sodium chloride 0.9 % 100 mL IVPB       Note to Pharmacy: Start after blood cultures drawn   2 g 200 mL/hr over 30 Minutes Intravenous Every 24 hours 10/03/23 0514     10/03/23 0600  metroNIDAZOLE (FLAGYL) tablet 500 mg  Status:  Discontinued        500 mg Oral Every 12 hours 10/03/23 0514 10/05/23 2130      Culture/Microbiology Radiology Studies: IR Perc Cholecystostomy  Result Date: 10/04/2023 INDICATION: 77 year old male with acute calculus cholecystitis EXAM: CHOLECYSTOSTOMY MEDICATIONS: None ANESTHESIA/SEDATION: Moderate (conscious) sedation was not employed during this procedure. A total of Versed 0 mg and Fentanyl 50 mcg was administered intravenously. Moderate Sedation Time: 0 minutes. The patient's level of consciousness and vital signs were monitored continuously by radiology nursing throughout the procedure under my direct supervision.  FLUOROSCOPY TIME:  Fluoroscopy Time:  (11 mGy). COMPLICATIONS: None PROCEDURE: Informed written consent was obtained from the patient and the patient's family after a thorough discussion of the procedural risks, benefits and alternatives. All questions were addressed. Maximal Sterile Barrier Technique was utilized including caps, mask, sterile gowns, sterile gloves, sterile drape, hand hygiene and skin antiseptic. A timeout was performed prior to the initiation of the procedure. Ultrasound survey of the right upper quadrant was performed for planning purposes. Once the patient is prepped and draped in the usual sterile fashion, the skin and subcutaneous tissues overlying the gallbladder were generously infiltrated 1% lidocaine for local anesthesia. A coaxial needle was advanced under ultrasound guidance through the skin  subcutaneous tissues and a small segment of liver into the gallbladder lumen. With removal of the stylet, spontaneous dark bile drainage occurred. Using modified Seldinger technique, a 10 French drain was placed into the gallbladder fossa, with aspiration of the sample for the lab. Contrast injection confirmed position of the tube within the gallbladder lumen. Drainage catheter was attached to gravity drain with a suture retention placed. Patient tolerated the procedure well and remained hemodynamically stable throughout. No complications were encountered and no significant blood loss encountered. IMPRESSION: Status post percutaneous cholecystostomy Signed, Yvone Neu. Miachel Roux, RPVI [facility] Radiology Electronically Signed   By: Gilmer Mor D.O.   On: 10/04/2023 16:59   DG CHEST PORT 1 VIEW  Result Date: 10/04/2023 CLINICAL DATA:  200808 Hypoxia [id number] EXAM: PORTABLE CHEST 1 VIEW COMPARISON:  Chest XR, 10/03/2023.  CT AP, 09/19/2023. FINDINGS: The cardiac apex is obscured. Aortic arch atherosclerosis. Hypoinflation the RIGHT lung is  clear. Small volume LEFT pleural effusion with retrocardiac basilar consolidation. No pneumothorax. No acute osseous abnormality. IMPRESSION: 1. Small volume LEFT pleural effusion. 2. Dependent LEFT basilar consolidation is likely to represent atelectasis, however superimposed pneumonia can appear similar. 3.  Aortic Atherosclerosis (ICD10-I70.0). Electronically Signed   By: Roanna Banning M.D.   On: 10/04/2023 14:23   NM Hepatobiliary Liver Func  Result Date: 10/04/2023 CLINICAL DATA:  RIGHT upper quadrant pain. Concern for acalculous cholecystitis. EXAM: NUCLEAR MEDICINE HEPATOBILIARY IMAGING TECHNIQUE: Sequential images of the abdomen were obtained out to 60 minutes following intravenous administration of radiopharmaceutical. RADIOPHARMACEUTICALS:  5.4 mCi Tc-39m  Choletec IV COMPARISON:  Ultrasound 10/03/2023 FINDINGS: Prompt clearance radiotracer from blood pool and homogeneous uptake in liver. Counts are evident within the small bowel by 20 minutes. Gallbladder failed to fill over 90 minutes of imaging (extended pre morphine time of 90 minutes performed as study was ordered with ejection fraction) At 90 minutes, 3 mg of IV morphine were administered. gallbladder failed to fill after morphine augmentation. IMPRESSION: 1. Non filling of the gallbladder most consistent with acute cholecystitis. 2. Patent common duct. These results will be called to the ordering clinician or representative by the Radiologist Assistant, and communication documented in the PACS or [employer]. Electronically Signed   By: Genevive Bi M.D.   On: 10/04/2023 12:56     LOS: 15 days   Lanae Boast, MD [facility]  10/05/2023, 9:43 AM

## 2023-10-05 NOTE — Progress Notes (Signed)
Nutrition Follow-up  DOCUMENTATION CODES:   Non-severe (moderate) malnutrition in context of chronic illness  INTERVENTION:  - D/C calorie count until Monday due to poor PO intake today  - Provide Vanilla Magic cup TID, each supplement provides 290 kcal and 9 grams of protein   - Continue Boost Breeze PO TID, each supplement provides 250 kcal and 9 grams of protein   - Consider Cortrak placement for supplemental nutrition support if continued poor PO intake   - Diet advancement per MD  NUTRITION DIAGNOSIS:   Moderate Malnutrition related to chronic illness (heart failure, afib) as evidenced by mild fat depletion, mild muscle depletion.  - Ongoing   GOAL:   Patient will meet greater than or equal to 90% of their needs  - Ongoing  MONITOR:   PO intake, Labs, Weight trends, Skin  REASON FOR ASSESSMENT:   Consult Assessment of nutrition requirement/status  ASSESSMENT:   Pt admitted with L femoral condyle avulsion fracture, T12 anterior wedge compression fracture sustained while he was changing a tire on his car and it fell on his leg. PMH significant for heart failure, afib, CAD, paroxysmal afib, HTN, CKD and BPH.  11/1 [facility] placement 11/2 1st iHD session- net UF 1L  11/5 2nd iHD 11/12: TDC removed, Clear liquids  11/13 - acute cholecystitis, cholecystostomy tube placed, NPO then Clears 1814 11/14 - NPO-> Dysphagia 3 @ 1046   - 48 hour calorie count ordered, Will D/C calorie count until next Monday due to little intake noted today. Hopefully will get better results next week.   HIDA scan showed acute cholecystitis, cholecystomy tube placed. Pt's diet has fluctuated form clear to NPO within the last 2 days and he was finally advanced to a dysphagia 3 diet today.   Family in room, pt was more awake than last visit. Pt states only having 1 Boost Breeze yesterday and nothing else. Today pt stated he had not had breakfast or lunch yet, but was getting a little hungry. Pt  reports gaining a little bit of his appetite back but, not much. Expect poor PO intake with calorie count due to pt previously having poor intake before admission and during. As well as pt being a picky eater. Pt did not want feeding tube placement.   Discussed with pt the importance of increasing his PO intake and offered Ensure Enlive. Pt stated he like the Boost Breeze better and would rather have those. Pt was willing to try the Magic Cups again.   Weight seems to fluctuate with fluid status during HD pt was around 100.1 kg, weight seems to be stabilizing.   According to documentation, pt has not had a bowel movement since 11/9?  Admit weight: 84.4 kg, stated Current weight: 95.6 kg    Nutritionally Relevant Medications: Scheduled Meds:  amiodarone  200 mg Oral BID   buPROPion  300 mg Oral Daily   cholecalciferol  1,000 Units Oral Daily   clopidogrel  75 mg Oral Daily   escitalopram  10 mg Oral Daily   feeding supplement  1 Container Oral TID BM   metoprolol succinate  25 mg Oral Daily   midodrine  10 mg Oral TID WC   multivitamin  1 tablet Oral QHS   cyanocobalamin  100 mcg Oral Daily   Labs Reviewed: Calcium 8.1 (L), Phosphorus 7 (H)  Diet Order:   Diet Order             DIET DYS 3 Room service appropriate? Yes; Fluid consistency:  Thin  Diet effective now                   EDUCATION NEEDS:   Education needs have been addressed  Skin:  Skin Assessment: Skin Integrity Issues: Skin Integrity Issues:: Other (Comment) Other: +2 edema perineal, LLE + RLE non pitting  Last BM:  11/9: type 7  Height:   Ht Readings from Last 1 Encounters:  09/19/23 5\' 10"  (1.778 m)    Weight:   Wt Readings from Last 1 Encounters:  10/04/23 95.6 kg    Ideal Body Weight:  75.5 kg  BMI:  Body mass index is 30.24 kg/m.  Estimated Nutritional Needs:   Kcal:  1800-2000  Protein:  95-110g  Fluid:  1L + UOP  Elliot Dally, RD Registered Dietitian  See Amion for more  information
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
## 2023-10-05 NOTE — Progress Notes (Addendum)
Cardiac Individual Treatment Plan  Patient Details  Name: Jorge West MRN: [id number] Date of Birth: 11-Apr-1946 Referring Provider:   Flowsheet Row CARDIAC REHAB PHASE II ORIENTATION from 09/05/2023 in [facility] CARDIAC REHABILITATION  Referring Provider Lucrezia Starch MD       Initial Encounter Date:  Flowsheet Row CARDIAC REHAB PHASE II ORIENTATION from 09/05/2023 in [place] CARDIAC REHABILITATION  Date 09/05/23       Visit Diagnosis: Status post coronary artery stent placement  Postsurgical percutaneous transluminal coronary angioplasty (PTCA) status  Patient's Home Medications on Admission: No current facility-administered medications for this visit. No current outpatient medications on file.  Facility-Administered Medications Ordered in Other Visits:    acetaminophen (TYLENOL) tablet 1,000 mg, 1,000 mg, Oral, Q6H PRN, Dolly Rias, MD, 1,000 mg at 10/04/23 1343   ALPRAZolam (XANAX) tablet 0.5 mg, 0.5 mg, Oral, TID PRN, Arrien, York Ram, MD, 0.5 mg at 10/04/23 1454   amiodarone (PACERONE) tablet 200 mg, 200 mg, Oral, BID, Hilty, Lisette Abu, MD, 200 mg at 10/05/23 0849   brimonidine (ALPHAGAN) 0.2 % ophthalmic solution 1 drop, 1 drop, Left Eye, Daily, 1 drop at 10/05/23 0836 **AND** timolol (TIMOPTIC) 0.5 % ophthalmic solution 1 drop, 1 drop, Left Eye, Daily, Arrien, York Ram, MD, 1 drop at 10/05/23 0837   buPROPion (WELLBUTRIN XL) 24 hr tablet 300 mg, 300 mg, Oral, Daily, Enedina Finner, MD, 300 mg at 10/05/23 0850   cefTRIAXone (ROCEPHIN) 2 g in sodium chloride 0.9 % 100 mL IVPB, 2 g, Intravenous, Q24H, Dolly Rias, MD, Stopped at 10/05/23 0547   Chlorhexidine Gluconate Cloth 2 % PADS 6 each, 6 each, Topical, Daily, Arrien, York Ram, MD, 6 each at 10/05/23 [id number]   cholecalciferol (VITAMIN D3) 25 MCG (1000 UNIT) tablet 1,000 Units, 1,000 Units, Oral, Daily, Enedina Finner, MD, 1,000 Units at 10/05/23 0850   clopidogrel (PLAVIX) tablet 75 mg, 75  mg, Oral, Daily, Arrien, York Ram, MD, 75 mg at 10/05/23 0850   escitalopram (LEXAPRO) tablet 10 mg, 10 mg, Oral, Daily, Enedina Finner, MD, 10 mg at 10/05/23 0851   feeding supplement (BOOST / RESOURCE BREEZE) liquid 1 Container, 1 Container, Oral, TID BM, Kc, Ramesh, MD, 1 Container at 10/03/23 1403   guaiFENesin (ROBITUSSIN) 100 MG/5ML liquid 5 mL, 5 mL, Oral, Q4H PRN, Amin, Ankit C, MD   HYDROmorphone (DILAUDID) injection 0.5 mg, 0.5 mg, Intravenous, Q3H PRN, Arrien, York Ram, MD, 0.5 mg at 09/28/23 0050   ipratropium-albuterol (DUONEB) 0.5-2.5 (3) MG/3ML nebulizer solution 3 mL, 3 mL, Nebulization, Q4H PRN, Amin, Ankit C, MD   metoprolol succinate (TOPROL-XL) 24 hr tablet 25 mg, 25 mg, Oral, Daily, Skains, Mark C, MD, 25 mg at 10/05/23 0850   metoprolol tartrate (LOPRESSOR) injection 5 mg, 5 mg, Intravenous, Q4H PRN, Amin, Ankit C, MD, 5 mg at 10/03/23 0133   midodrine (PROAMATINE) tablet 10 mg, 10 mg, Oral, TID WC, Amin, Ankit C, MD, 10 mg at 10/05/23 1227   multivitamin (RENA-VIT) tablet 1 tablet, 1 tablet, Oral, QHS, Arrien, York Ram, MD, 1 tablet at 10/04/23 2139   nitroGLYCERIN (NITROSTAT) SL tablet 0.4 mg, 0.4 mg, Sublingual, Q5 min PRN, Sundil, Subrina, MD   ondansetron [facility]) injection 4 mg, 4 mg, Intravenous, Q6H PRN, Sundil, Subrina, MD, 4 mg at 10/04/23 1206   oxyCODONE (Oxy IR/ROXICODONE) immediate release tablet 5 mg, 5 mg, Oral, Q4H PRN, Arrien, York Ram, MD, 5 mg at 10/03/23 0033   senna-docusate (Senokot-S) tablet 1 tablet, 1 tablet, Oral, QHS PRN, Amin,  Ankit C, MD   sodium chloride flush (NS) 0.9 % injection 3 mL, 3 mL, Intravenous, Q12H, Sundil, Subrina, MD, 3 mL at 10/05/23 0837   sodium chloride flush (NS) 0.9 % injection 3 mL, 3 mL, Intravenous, PRN, Sundil, Subrina, MD   sodium chloride flush (NS) 0.9 % injection 5 mL, 5 mL, Intracatheter, Q8H, Wagner, Jaime, DO, 5 mL at 10/05/23 1228   traZODone (DESYREL) tablet 50 mg, 50 mg, Oral, QHS PRN,  Amin, Ankit C, MD, 50 mg at 09/28/23 2205   vitamin B-12 (CYANOCOBALAMIN) tablet 100 mcg, 100 mcg, Oral, Daily, Enedina Finner, MD, 100 mcg at 10/05/23 0850  Past Medical History: Past Medical History:  Diagnosis Date   Atrial fibrillation [facility])    Had an ablation done   Atypical mole 06/14/2000   moderate/marked on mid back [facility])   Atypical mole 11/29/2001   slight/moderate on right shoulder [facility])   Atypical mole 11/29/2001   moderate on right lateral abdomen [facility])   Atypical mole 11/29/2001   moderate on right forearm [facility])   Atypical mole 01/11/2007   moderate on lower mid back   Atypical mole 07/14/2008   SK and atypical solar lentigo on left jawline (widershave)   Basal cell carcinoma 07/29/1996   back left lower ear - CX3+excision   Basal cell carcinoma 09/22/2004   basosquamous on right tip of nose (MOHs)   Basal cell carcinoma 10/20/2005   superficial on upper center back - CX3+5FU   Basal cell carcinoma 10/20/2011   right upper back - tx p bx   Basal cell carcinoma 02/07/2018   superficial/nodular on left upper arm - CX3+cautery+5FU   Basal cell carcinoma 10/23/2018   [facility])   Benign prostate hyperplasia    Cataract    left and removed   Depression    Depression    Diverticulosis    Erectile dysfunction    Hyperlipidemia    Hypertension    Impaired fasting glucose    Nocturia    Personal history of colonic polyps 04/19/2005   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   Left Temporal Scalp (well diff)   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   left post auricular - clear at visit on 12/28/2001   Squamous cell carcinoma of skin 10/08/2003   well differentiated below outer left eye - MOHs   Squamous cell carcinoma of skin 06/02/2008   well differentiated behind left ear   Squamous cell carcinoma of skin 10/20/2011   KA on left elbow   Squamous cell carcinoma of skin 03/02/2015   well  differentiated on right outer cheek - CX3+excision   Squamous cell carcinoma of skin 03/01/2017   well differentiated on left forearm - tx p bx   Squamous cell carcinoma of skin 03/07/2018   moderately differentiated on left jawline - CX3+excision    Tobacco Use: Social History   Tobacco Use  Smoking Status Never  Smokeless Tobacco Former  Tobacco Comments   10-15 yeras quit     Labs: Review Flowsheet       Latest Ref Rng & Units 09/24/2012 09/19/2023  Labs for ITP Cardiac and Pulmonary Rehab  TCO2 22 - 32 mmol/L 22  21     Details            Capillary Blood Glucose: No results found for: "GLUCAP"   Exercise Target Goals: Exercise Program Goal: Individual exercise prescription set using results from initial 6 min walk test and THRR  while considering  patient's activity barriers and safety.   Exercise Prescription Goal: Starting with aerobic activity 30 plus minutes a day, 3 days per week for initial exercise prescription. Provide home exercise prescription and guidelines that participant acknowledges understanding prior to discharge.  Activity Barriers & Risk Stratification:  Activity Barriers & Cardiac Risk Stratification - 09/05/23 0936       Activity Barriers & Cardiac Risk Stratification   Activity Barriers Arthritis;Shortness of Breath;Deconditioning;Muscular Weakness;Balance Concerns    Cardiac Risk Stratification Moderate             6 Minute Walk:  6 Minute Walk     Row Name 09/05/23 0935         6 Minute Walk   Phase Initial     Distance 885 feet     Walk Time 6 minutes     # of Rest Breaks 0     MPH 1.67     METS 1.89     RPE 13     Perceived Dyspnea  2     VO2 Peak 6.63     Symptoms Yes (comment)     Comments SOB, legs fatigued     Resting HR 85 bpm     Resting BP 124/66     Resting Oxygen Saturation  96 %     Exercise Oxygen Saturation  during 6 min walk 95 %     Max Ex. HR 117 bpm  got up to 134 with weights     Max Ex. BP  134/72     2 Minute Post BP 124/56              Oxygen Initial Assessment:   Oxygen Re-Evaluation:   Oxygen Discharge (Final Oxygen Re-Evaluation):   Initial Exercise Prescription:  Initial Exercise Prescription - 09/05/23 0900       Date of Initial Exercise RX and Referring Provider   Date 09/05/23    Referring Provider Lucrezia Starch MD      Oxygen   Maintain Oxygen Saturation 88% or higher      NuStep   Level 2    SPM 80    Minutes 15    METs 2      Arm Ergometer   Level 1    Watts 25    RPM 25    Minutes 15    METs 2      Prescription Details   Frequency (times per week) 3    Duration Progress to 30 minutes of continuous aerobic without signs/symptoms of physical distress      Intensity   THRR 40-80% of Max Heartrate 108-132    Ratings of Perceived Exertion 11-13    Perceived Dyspnea 0-4      Progression   Progression Continue to progress workloads to maintain intensity without signs/symptoms of physical distress.      Resistance Training   Training Prescription Yes    Weight 5 lb    Reps 10-15             Perform Capillary Blood Glucose checks as needed.  Exercise Prescription Changes:   Exercise Prescription Changes     Row Name 09/05/23 0900             Response to Exercise   Blood Pressure (Admit) 124/66       Blood Pressure (Exercise) 134/72       Blood Pressure (Exit) 124/56       Heart Rate (Admit) 85 bpm  Heart Rate (Exercise) 117 bpm  134 during weights       Heart Rate (Exit) 113 bpm       Oxygen Saturation (Admit) 96 %       Oxygen Saturation (Exercise) 95 %       Rating of Perceived Exertion (Exercise) 13       Perceived Dyspnea (Exercise) 2       Symptoms SOB, legs fatigued       Comments walk test results                Exercise Comments:   Exercise Comments     Row Name 09/08/23 1132           Exercise Comments First full day of exercise!  Patient was oriented to gym and equipment  including functions, settings, policies, and procedures.  Patient's individual exercise prescription and treatment plan were reviewed.  All starting workloads were established based on the results of the 6 minute walk test done at initial orientation visit.  The plan for exercise progression was also introduced and progression will be customized based on patient's performance and goals.                Exercise Goals and Review:   Exercise Goals     Row Name 09/05/23 0938             Exercise Goals   Increase Physical Activity Yes       Intervention Provide advice, education, support and counseling about physical activity/exercise needs.;Develop an individualized exercise prescription for aerobic and resistive training based on initial evaluation findings, risk stratification, comorbidities and participant's personal goals.       Expected Outcomes Short Term: Attend rehab on a regular basis to increase amount of physical activity.;Long Term: Add in home exercise to make exercise part of routine and to increase amount of physical activity.;Long Term: Exercising regularly at least 3-5 days a week.       Increase Strength and Stamina Yes       Intervention Provide advice, education, support and counseling about physical activity/exercise needs.;Develop an individualized exercise prescription for aerobic and resistive training based on initial evaluation findings, risk stratification, comorbidities and participant's personal goals.       Expected Outcomes Short Term: Increase workloads from initial exercise prescription for resistance, speed, and METs.;Short Term: Perform resistance training exercises routinely during rehab and add in resistance training at home;Long Term: Improve cardiorespiratory fitness, muscular endurance and strength as measured by increased METs and functional capacity ( )       Able to understand and use rate of perceived exertion (RPE) scale Yes       Intervention  Provide education and explanation on how to use RPE scale       Expected Outcomes Short Term: Able to use RPE daily in rehab to express subjective intensity level;Long Term:  Able to use RPE to guide intensity level when exercising independently       Able to understand and use Dyspnea scale Yes       Intervention Provide education and explanation on how to use Dyspnea scale       Expected Outcomes Short Term: Able to use Dyspnea scale daily in rehab to express subjective sense of shortness of breath during exertion;Long Term: Able to use Dyspnea scale to guide intensity level when exercising independently       Knowledge and understanding of Target Heart Rate Range (THRR) Yes  Intervention Provide education and explanation of THRR including how the numbers were predicted and where they are located for reference       Expected Outcomes Short Term: Able to state/look up THRR;Long Term: Able to use THRR to govern intensity when exercising independently;Short Term: Able to use daily as guideline for intensity in rehab       Able to check pulse independently Yes       Intervention Provide education and demonstration on how to check pulse in carotid and radial arteries.;Review the importance of being able to check your own pulse for safety during independent exercise       Expected Outcomes Short Term: Able to explain why pulse checking is important during independent exercise;Long Term: Able to check pulse independently and accurately       Understanding of Exercise Prescription Yes       Intervention Provide education, explanation, and written materials on patient's individual exercise prescription       Expected Outcomes Short Term: Able to explain program exercise prescription;Long Term: Able to explain home exercise prescription to exercise independently                Exercise Goals Re-Evaluation :  Exercise Goals Re-Evaluation     Row Name 09/08/23 1133             Exercise Goal  Re-Evaluation   Exercise Goals Review Understanding of Exercise Prescription;Knowledge and understanding of Target Heart Rate Range (THRR);Able to understand and use rate of perceived exertion (RPE) scale;Able to understand and use Dyspnea scale       Comments Reviewed RPE and dyspnea scale, THR and program prescription with pt today.  Pt voiced understanding and was given a copy of goals to take home.       Expected Outcomes Short: Use RPE daily to regulate intensity.  Long: Follow program prescription in THR.                 Discharge Exercise Prescription (Final Exercise Prescription Changes):  Exercise Prescription Changes - 09/05/23 0900       Response to Exercise   Blood Pressure (Admit) 124/66    Blood Pressure (Exercise) 134/72    Blood Pressure (Exit) 124/56    Heart Rate (Admit) 85 bpm    Heart Rate (Exercise) 117 bpm   134 during weights   Heart Rate (Exit) 113 bpm    Oxygen Saturation (Admit) 96 %    Oxygen Saturation (Exercise) 95 %    Rating of Perceived Exertion (Exercise) 13    Perceived Dyspnea (Exercise) 2    Symptoms SOB, legs fatigued    Comments walk test results             Nutrition:  Target Goals: Understanding of nutrition guidelines, daily intake of sodium 1500mg , cholesterol 200mg , calories 30% from fat and 7% or less from saturated fats, daily to have 5 or more servings of fruits and vegetables.  Biometrics:  Pre Biometrics - 09/05/23 0938       Pre Biometrics   Height 5\' 7"  (1.702 m)    Weight 186 lb 1.6 oz (84.4 kg)    Waist Circumference 37 inches    Hip Circumference 39.5 inches    Waist to Hip Ratio 0.94 %    BMI (Calculated) 29.14    Grip Strength 26.4 kg    Single Leg Stand 17.6 seconds              Nutrition Therapy Plan and  Nutrition Goals:   Nutrition Assessments:  MEDIFICTS Score Key: >=70 Need to make dietary changes  40-70 Heart Healthy Diet <= 40 Therapeutic Level Cholesterol Diet  Flowsheet Row CARDIAC  REHAB PHASE II ORIENTATION from 09/05/2023 in [facility] CARDIAC REHABILITATION  Picture Your Plate Total Score on Admission 63      Picture Your Plate Scores: <81 Unhealthy dietary pattern with much room for improvement. 41-50 Dietary pattern unlikely to meet recommendations for good health and room for improvement. 51-60 More healthful dietary pattern, with some room for improvement.  >60 Healthy dietary pattern, although there may be some specific behaviors that could be improved.    Nutrition Goals Re-Evaluation:   Nutrition Goals Discharge (Final Nutrition Goals Re-Evaluation):   Psychosocial: Target Goals: Acknowledge presence or absence of significant depression and/or stress, maximize coping skills, provide positive support system. Participant is able to verbalize types and ability to use techniques and skills needed for reducing stress and depression.  Initial Review & Psychosocial Screening:  Initial Psych Review & Screening - 08/30/23 1116       Initial Review   Current issues with History of Depression;Current Psychotropic Meds      Family Dynamics   Good Support System? Yes      Barriers   Psychosocial barriers to participate in program The patient should benefit from training in stress management and relaxation.;There are no identifiable barriers or psychosocial needs.      Screening Interventions   Interventions Encouraged to exercise;To provide support and resources with identified psychosocial needs;Provide feedback about the scores to participant    Expected Outcomes Short Term goal: Utilizing psychosocial counselor, staff and physician to assist with identification of specific Stressors or current issues interfering with healing process. Setting desired goal for each stressor or current issue identified.;Long Term Goal: Stressors or current issues are controlled or eliminated.;Short Term goal: Identification and review with participant of any Quality of Life or  Depression concerns found by scoring the questionnaire.;Long Term goal: The participant improves quality of Life and PHQ9 Scores as seen by post scores and/or verbalization of changes             Quality of Life Scores:  Quality of Life - 08/30/23 1114       Quality of Life   Select Quality of Life      Quality of Life Scores   Health/Function Pre 18.6 %    Socioeconomic Pre 24.75 %    Psych/Spiritual Pre 28.29 %    Family Pre 30 %    GLOBAL Pre 23.57 %            Scores of 19 and below usually indicate a poorer quality of life in these areas.  A difference of  2-3 points is a clinically meaningful difference.  A difference of 2-3 points in the total score of the Quality of Life Index has been associated with significant improvement in overall quality of life, self-image, physical symptoms, and general health in studies assessing change in quality of life.  PHQ-9: Review Flowsheet       09/05/2023  Depression screen PHQ 2/9  Decreased Interest 0  Down, Depressed, Hopeless 0  PHQ - 2 Score 0  Altered sleeping 0  Tired, decreased energy 3  Change in appetite 1  Feeling bad or failure about yourself  0  Trouble concentrating 0  Moving slowly or fidgety/restless 1  Suicidal thoughts 0  PHQ-9 Score 5  Difficult doing work/chores Somewhat difficult  Details           Interpretation of Total Score  Total Score Depression Severity:  1-4 = Minimal depression, 5-9 = Mild depression, 10-14 = Moderate depression, 15-19 = Moderately severe depression, 20-27 = Severe depression   Psychosocial Evaluation and Intervention:  Psychosocial Evaluation - 08/30/23 1116       Psychosocial Evaluation & Interventions   Interventions Stress management education;Relaxation education;Encouraged to exercise with the program and follow exercise prescription    Comments Patient was referred to CR with Stent Placement and PTCA from [facility]. He does have a history of depression and is  currently taking Buspar and Lexapro. He says he does not have any depression or anxiety presently. He says he sleeps well.  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 His main stressor currently is not able to take care of his farm since his stent but his son is helping out until he is able. He lives with his wife of many years who is his main support along with his 2 sons and a grandson. His main issue before and since his stent is SOB. He complained about this at his follow up cardiology visit and he has been having some hypotensive episodes. He has chronic A-fib. They did make some medication adjustments for the hypotension. His main goal for the program is to improve his SOB and energy and to be able to get back to doing his farming and living a normal life again. He has no barriers identiifed to complete the program.    Expected Outcomes Short Term: Start the program and attend consistently. Long Term: meet his perosnal goals.    Continue Psychosocial Services  Follow up required by staff             Psychosocial Re-Evaluation:   Psychosocial Discharge (Final Psychosocial Re-Evaluation):   Vocational Rehabilitation: Provide vocational rehab assistance to qualifying candidates.   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             Education: Education Goals: Education classes will be provided on a weekly basis, covering required topics. Participant will state understanding/return demonstration of topics presented.  Learning Barriers/Preferences:  Learning Barriers/Preferences - 08/30/23 1108       Learning Barriers/Preferences   Learning Barriers None    Learning Preferences  Audio;Written Material;Skilled Demonstration             Education Topics: Hypertension, Hypertension Reduction -Define heart disease and high blood pressure. Discus how high blood pressure affects the body and ways to reduce high blood pressure.   Exercise and Your Heart -Discuss why it is important to exercise, the FITT principles of exercise, normal and abnormal responses to exercise, and how to exercise safely.   Angina -Discuss definition of angina, causes of angina, treatment of angina, and how to decrease risk of having angina.   Cardiac Medications -Review what the following cardiac medications are used for, how they affect the body, and side effects that may occur when taking the medications.  Medications include Aspirin, Beta blockers, calcium channel blockers, ACE Inhibitors, angiotensin receptor blockers, diuretics, digoxin, and antihyperlipidemics.   Congestive Heart Failure -Discuss the definition of CHF, how  to live with CHF, the signs and symptoms of CHF, and how keep track of weight and sodium intake.   Heart Disease and Intimacy -Discus the effect sexual activity has on the heart, how changes occur during intimacy as we age, and safety during sexual activity.   Smoking Cessation / COPD -Discuss different methods to quit smoking, the health benefits of quitting smoking, and the definition of COPD.   Nutrition I: Fats -Discuss the types of cholesterol, what cholesterol does to the heart, and how cholesterol levels can be controlled.   Nutrition II: Labels -Discuss the different components of food labels and how to read food label   Heart Parts/Heart Disease and PAD -Discuss the anatomy of the heart, the pathway of blood circulation through the heart, and these are affected by heart disease.   Stress I: Signs and Symptoms -Discuss the causes of stress, how stress may lead to anxiety and depression, and ways to limit stress. Flowsheet Row CARDIAC REHAB  PHASE II EXERCISE from 09/15/2023 in [place] CARDIAC REHABILITATION  Date 09/13/23  Educator jh  Instruction Review Code 1- Verbalizes Understanding       Stress II: Relaxation -Discuss different types of relaxation techniques to limit stress.   Warning Signs of Stroke / TIA -Discuss definition of a stroke, what the signs and symptoms are of a stroke, and how to identify when someone is having stroke.   Knowledge Questionnaire Score:  Knowledge Questionnaire Score - 08/30/23 1111       Knowledge Questionnaire Score   Pre Score 24/26             Core Components/Risk Factors/Patient Goals at Admission:  Personal Goals and Risk Factors at Admission - 09/05/23 0939       Core Components/Risk Factors/Patient Goals on Admission    Weight Management Yes;Weight Loss;Weight Maintenance    Intervention Weight Management: Develop a combined nutrition and exercise program designed to reach desired caloric intake, while maintaining appropriate intake of nutrient and fiber, sodium and fats, and appropriate energy expenditure required for the weight goal.;Weight Management: Provide education and appropriate resources to help participant work on and attain dietary goals.;Weight Management/Obesity: Establish reasonable short term and long term weight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

## 2023-10-05 NOTE — Progress Notes (Signed)
Inpatient Rehab Admissions Coordinator:   Continue to follow for medical and therapy progress.   Estill Dooms, PT, DPT Admissions Coordinator [phone] 10/05/23  8:57 AM

## 2023-10-06 ENCOUNTER — Encounter (HOSPITAL_COMMUNITY): Payer: Medicare PPO

## 2023-10-06 DIAGNOSIS — N1832 Chronic kidney disease, stage 3b: Secondary | ICD-10-CM | POA: Diagnosis not present

## 2023-10-06 DIAGNOSIS — N179 Acute kidney failure, unspecified: Secondary | ICD-10-CM | POA: Diagnosis not present

## 2023-10-06 LAB — RENAL FUNCTION PANEL
Albumin: 1.7 g/dL — ABNORMAL LOW (ref 3.5–5.0)
Anion gap: 11 (ref 5–15)
BUN: 92 mg/dL — ABNORMAL HIGH (ref 8–23)
CO2: 26 mmol/L (ref 22–32)
Calcium: 8.4 mg/dL — ABNORMAL LOW (ref 8.9–10.3)
Chloride: 100 mmol/L (ref 98–111)
Creatinine, Ser: 4.69 mg/dL — ABNORMAL HIGH (ref 0.61–1.24)
GFR, Estimated: 12 mL/min — ABNORMAL LOW (ref >60)
Glucose, Bld: 87 mg/dL (ref 70–99)
Phosphorus: 6.1 mg/dL — ABNORMAL HIGH (ref 2.5–4.6)
Potassium: 3.7 mmol/L (ref 3.5–5.1)
Sodium: 137 mmol/L (ref 135–145)

## 2023-10-06 LAB — GLUCOSE, CAPILLARY: Glucose-Capillary: 103 mg/dL — ABNORMAL HIGH (ref 70–99)

## 2023-10-06 LAB — CBC
HCT: 23.4 % — ABNORMAL LOW (ref 39.0–52.0)
Hemoglobin: 7.4 g/dL — ABNORMAL LOW (ref 13.0–17.0)
MCH: 28.9 pg (ref 26.0–34.0)
MCHC: 31.6 g/dL (ref 30.0–36.0)
MCV: 91.4 fL (ref 80.0–100.0)
Platelets: 460 10*3/uL — ABNORMAL HIGH (ref 150–400)
RBC: 2.56 MIL/uL — ABNORMAL LOW (ref 4.22–5.81)
RDW: 13.8 % (ref 11.5–15.5)
WBC: 13.1 10*3/uL — ABNORMAL HIGH (ref 4.0–10.5)
nRBC: 0 % (ref 0.0–0.2)

## 2023-10-06 MED ORDER — DOCUSATE SODIUM 100 MG PO CAPS
100.0000 mg | ORAL_CAPSULE | Freq: Every day | ORAL | Status: DC
  Administered 2023-10-06 – 2023-10-18 (×7): 100 mg via ORAL
  Filled 2023-10-06 (×10): qty 1

## 2023-10-06 MED ORDER — SENNOSIDES-DOCUSATE SODIUM 8.6-50 MG PO TABS
1.0000 | ORAL_TABLET | Freq: Every day | ORAL | Status: DC
  Administered 2023-10-06 – 2023-10-10 (×5): 1 via ORAL
  Filled 2023-10-06 (×9): qty 1

## 2023-10-06 MED ORDER — SENNOSIDES-DOCUSATE SODIUM 8.6-50 MG PO TABS: 1.0000 | ORAL_TABLET | Freq: Every day | ORAL | Status: DC

## 2023-10-06 MED ORDER — ALBUMIN HUMAN 5 % IV SOLN
25.0000 g | Freq: Four times a day (QID) | INTRAVENOUS | Status: AC
  Administered 2023-10-06 (×2): 25 g via INTRAVENOUS
  Filled 2023-10-06 (×2): qty 500

## 2023-10-06 MED ORDER — POLYETHYLENE GLYCOL 3350 17 G PO PACK
17.0000 g | PACK | Freq: Every day | ORAL | Status: DC
  Administered 2023-10-06 – 2023-10-18 (×5): 17 g via ORAL
  Filled 2023-10-06 (×5): qty 1
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
## 2023-10-06 NOTE — Progress Notes (Signed)
PROGRESS NOTE JAAN FILAR  OZD:[id number] DOB: Aug 28, 1946 DOA: 09/19/2023 PCP: Elizabeth Palau, FNP  Brief Narrative/Hospital Course: 77 year old with history of CHF, A-fib, CAD, HTN, CKD, BPH presenting with left lower extremity trauma Anteroapical and then falling on the floor. Apparently was trapped for about 2 hours until family found him. Upon admission CT head, cervical spine negative, CT chest abdomen pelvis showed acute T12 compression fracture, CT of lower extremity showed asymmetric enlargement of muscle on the left.  Patient was admitted for left femoral condyle avulsion fracture, severe rhabdomyolysis and AKI on CKD. Patient started on dialysis.  Nephrology is following closely.  Patient had fever episode along with right upper quadrant abdominal pain, [place] 11/12-mild wall thickening gallbladder but no gallstone, placed on antibiotics HIDA scan obtained 11/13> was positive for acute cholecystitis-CCS was consulted, given his comorbidities felt to be high risk for surgical intervention IR consulted and cholecystostomy tube placed 11/13.     Subjective: Patient seen and examined this morning.  He is in the bedside chair appears alert awake wife is at the bedside He has had poor oral intake Overnight patient has been afebrile,  Labs shows WBC continues to improve hemoglobin also drifting, creatinine at 4.6 from 4.8  Passing gas but no bowel movement yet as not eating much - overall abdominal pain has improved no nausea or vomiting    Assessment and Plan: Principal Problem:   Acute kidney injury superimposed on stage 3b chronic kidney disease (HCC) Active Problems:   Atrial fibrillation with RVR (HCC)   Essential hypertension   Closed left femoral condyle avulsion fracture (HCC)   Coronary artery disease   Anemia of chronic disease   Malnutrition of moderate degree   Persistent atrial fibrillation (HCC)  Oliguric AKI on CKD 3B  2/2 rhabdomyolysis and contrast nephrotoxicity:   Nephrology following, HD started 11/2.  Right internal jugular tunneled HD cath 11/1- removed 11/12.  Encourage oral hydration, creatinine slightly improving, nephrology following closely.  He does have CHF caution with IV fluids.  Advance diet Recent Labs    09/27/23 0422 09/28/23 0520 09/29/23 0510 09/30/23 0441 10/01/23 0500 10/02/23 0515 10/03/23 0422 10/04/23 0419 10/05/23 0512 10/06/23 0416  BUN 53* 61* 69* 69* 68* 63* 71* 79* 85* 92*  CREATININE 5.93* 6.17* 6.48* 6.34* 5.66* 4.98* 4.69* 4.78* 4.82* 4.69*  CO2 29 29 29 28 28 29 29 28 25  26  K 3.3* 3.6 3.4* 3.8 3.8 3.5 3.6 4.0 4.1 3.7   Intake/Output Summary (Last 24 hours) at 10/06/2023 1052 Last data filed at 10/06/2023 2595 Gross per 24 hour  Intake --  Output 856 ml  Net -856 ml     Traumatic rhabdomyolysis: CK has improved overall continue oral hydration  Acute cholecystitis: Patient had fever episode along with abdominal pain, leukocytosis> [place] 11/12-mild wall thickening gallbladder but no gallstone, placed on antibiotics HIDA scan obtained 11/13> was positive for acute cholecystitis-CCS was consulted, given his comorbidities felt to be high risk for surgical intervention IR consulted and cholecystostomy tube placed 11/13.  Culture with Citrobacter, blood culture 11/2 NGTD .clinically improving no fever, WBC downtrending and pain improved but has poor oral intake, on DYS 3 diet. Continue ceftriaxone. Recent Labs  Lab 09/30/23 0441 10/03/23 0415 10/03/23 0526 10/04/23 0419 10/05/23 0512 10/06/23 0416  WBC 8.4 20.1*  --  19.7* 15.1* 13.1*  LATICACIDVEN  --   --  0.9  --   --   --     Hypokalemia: Resolved  Permanent atrial fibrillation with  RVR: Heart rate remains borderline controlled.Continue amiodarone, Toprol-XL.  Eliquis remains on hold after discussion with cardiology, and no need for bridging.  If hemoglobin is stable 11/16 will resume Eliquis -previously blood pressure had been soft limiting the  titration of beta-blocker-BP better now.  CAD w/ PCI placed 2 months ago: No chest pain. On Plavix.  Essential hypertension: BP overall improved no more hypotension, he is on midodrine holding Entresto   Scrotal edema Elevate   Acute hypoxic respiratory failure: Multifactorial in the setting of CHF, and acute cholecystitis and abdominal pain, continue supplement oxygen, encourage I-S PT OT   Acute on chronic diastolic CHF: TTE- LVEF:60- to 65%, mild LVH, RV with normal systolic function,no significant valvular disease. Cont metoprolol. Wean off ivf as able to eat.Avoiding ARB/ARNI due to AKI  Net IO Since Admission: 5,087.96 mL [10/06/23 1052]   Closed left femoral condyle avulsion fracture T12 compression fracture: Continue PT OT and pain control.  Seen by orthopedics WBAT and follow-up outpatient with Dr. Zoe Lan need reconstructive surgery down the road but no acute surgical recommendation advised this admission   Anemia of chronic disease Hemoglobin fluctuating, remains high risk for ABLA-given his Eliquis and plavx-jneeded urgent procedure w/ IR cholecystostomy tube.  Monitor and transfuse if < 7 g, Recent Labs  Lab 09/30/23 0441 10/03/23 0415 10/04/23 0419 10/05/23 0512 10/06/23 0416  HGB 8.1* 8.5* 8.0* 7.8* 7.4*  HCT 24.1* 25.2* 24.9* 24.1* 23.4*    Malnutrition of moderate degree Continue nutritional supplements Nutrition Problem: Moderate Malnutrition Etiology: chronic illness (heart failure, afib) Signs/Symptoms: mild fat depletion, mild muscle depletion Interventions: Ensure Enlive (each supplement provides 350kcal and 20 grams of protein), MVI, Magic cup, Education  .  Obesity:Patient's Body mass index is 30.24 kg/m. : Will benefit with PCP follow-up, weight loss  healthy lifestyle and outpatient sleep evaluation.  DVT prophylaxis: SCDs Start: 09/20/23 0018 Place TED hose Start: 09/20/23 0018 eliquis Code Status:   Code Status: Full Code Family  Communication: plan of care discussed with patient/wife updated at bedside  in details Patient status is: Inpatient because of AKI Level of care: Telemetry Cardiac   Dispo: The patient is from: home w/ wife            Anticipated disposition: TBD Objective: Vitals last 24 hrs: Vitals:   10/05/23 1928 10/05/23 2314 10/06/23 0312 10/06/23 0837  BP: (!) 124/92 (!) 124/91 119/88 112/82  Pulse: (!) 109 (!) 115 (!) 114   Resp: 18 16 16 20   Temp: 98.4 F (36.9 C) 98 F (36.7 C) 98 F (36.7 C) 97.6 F (36.4 C)  TempSrc: Oral Oral Oral Oral  SpO2: 92% 95% 94%   Weight:      Height:       Weight change:   Physical Examination: General exam: alert awake, oriented ,weak frail HEENT:Oral mucosa moist, Ear/Nose WNL grossly Respiratory system: Bilaterally clear BS,no use of accessory muscle Cardiovascular system: S1 & S2 +, No JVD. Gastrointestinal system: Abdomen soft, mild tenderness in the right abdomen,ND, BS+ Nervous System: Alert, awake, moving all extremities,and following commands. Extremities: LE edema neg,distal peripheral pulses palpable and warm.  Skin: No rashes,no icterus. MSK: Normal muscle bulk,tone, power   Medications reviewed:  Scheduled Meds:  amiodarone  200 mg Oral BID   brimonidine  1 drop Left Eye Daily   And   timolol  1 drop Left Eye Daily   buPROPion  300 mg Oral Daily   cholecalciferol  1,000 Units Oral Daily   clopidogrel  75 mg Oral Daily   escitalopram  10 mg Oral Daily   feeding supplement  1 Container Oral TID BM   metoprolol succinate  25 mg Oral Daily   midodrine  10 mg Oral TID WC   multivitamin  1 tablet Oral QHS   sodium chloride flush  3 mL Intravenous Q12H   sodium chloride flush  5 mL Intracatheter Q8H   cyanocobalamin  100 mcg Oral Daily   Continuous Infusions:  cefTRIAXone (ROCEPHIN)  IV 2 g (10/06/23 0546)      Diet Order             DIET DYS 3 Room service appropriate? Yes; Fluid consistency: Thin  Diet effective now                    Intake/Output Summary (Last 24 hours) at 10/06/2023 1052 Last data filed at 10/06/2023 0839 Gross per 24 hour  Intake --  Output 856 ml  Net -856 ml   Net IO Since Admission: 5,087.96 mL [10/06/23 1052]  Wt Readings from Last 3 Encounters:  10/04/23 95.6 kg  09/05/23 84.4 kg  01/31/21 89.4 kg     Unresulted Labs (From admission, onward)     Start     Ordered   10/03/23 0500  CBC  Daily,   R     Question:  Specimen collection method  Answer:  Unit=Unit collect   10/02/23 0859   09/19/23 2017  Prepare fresh frozen plasma  (**Emergency Blood Administration**)  ONCE - STAT,   STAT       Comments: Emergent release   Question Answer Comment  # of Units 2 units   Special Requirements Other (Specify)   Emergent release call blood bank [facility] [phone]   Attestation: Emergent transfusion, the ordering licensed practitioner has determined that the risk of delaying transfusion outweighs the risk of transfusing incompatible or incompletely tested units.     Placed in "And" Linked Group   09/19/23 2016          Data Reviewed: I have personally reviewed following labs and imaging studies CBC: Recent Labs  Lab 09/30/23 0441 10/03/23 0415 10/04/23 0419 10/05/23 0512 10/06/23 0416  WBC 8.4 20.1* 19.7* 15.1* 13.1*  HGB 8.1* 8.5* 8.0* 7.8* 7.4*  HCT 24.1* 25.2* 24.9* 24.1* 23.4*  MCV 89.3 89.7 91.5 92.3 91.4  PLT 316 374 402* 394 460*   Basic Metabolic Panel: Recent Labs  Lab 09/30/23 0441 10/01/23 0410 10/01/23 0500 10/02/23 0515 10/03/23 0415 10/03/23 0422 10/04/23 0419 10/05/23 0512 10/06/23 0416  NA 138  --    < > 134*  --  135 136 137 137  K 3.8  --    < > 3.5  --  3.6 4.0 4.1 3.7  CL 99  --    < > 96*  --  95* 96* 100 100  CO2 28  --    < > 29  --  29 28 25 26   GLUCOSE 89  --    < > 115*  --  111* 100* 86 87  BUN 69*  --    < > 63*  --  71* 79* 85* 92*  CREATININE 6.34*  --    < > 4.98*  --  4.69* 4.78* 4.82* 4.69*  CALCIUM 8.6*  --     < > 8.5*  --  8.6* 8.5* 8.1* 8.4*  MG 2.0 2.0  --  1.9 1.8  --  1.9  --   --  PHOS  --   --    < > 5.4*  --  5.0* 6.0* 7.0* 6.1*   < > = values in this interval not displayed.   GFR: Estimated Creatinine Clearance: 15.3 mL/min (A) (by C-G formula based on SCr of 4.69 mg/dL (H)). Liver Function Tests: Recent Labs  Lab 10/03/23 0422 10/03/23 0526 10/04/23 0419 10/05/23 0512 10/06/23 0416  AST  --  18  --   --   --   ALT  --  19  --   --   --   ALKPHOS  --  42  --   --   --   BILITOT  --  0.9  --   --   --   PROT  --  5.3*  --   --   --   ALBUMIN 2.0* 2.0* 1.8* 1.7* 1.7*   Recent Results (from the past 240 hour(s))  Resp panel by RT-PCR (RSV, Flu A&B, Covid) Anterior Nasal Swab     Status: None   Collection Time: 10/03/23  5:18 AM   Specimen: Anterior Nasal Swab  Result Value Ref Range Status   SARS Coronavirus 2 by RT PCR NEGATIVE NEGATIVE Final   Influenza A by PCR NEGATIVE NEGATIVE Final   Influenza B by PCR NEGATIVE NEGATIVE Final    Comment: (NOTE) The Xpert Xpress SARS-CoV-2/FLU/RSV plus assay is intended as an aid in the diagnosis of influenza from Nasopharyngeal swab specimens and should not be used as a sole basis for treatment. Nasal washings and aspirates are unacceptable for Xpert Xpress SARS-CoV-2/FLU/RSV testing.  Fact Sheet for Patients: BloggerCourse.com  Fact Sheet for Healthcare Providers: SeriousBroker.it  This test is not yet approved or cleared by the [place] FDA and has been authorized for detection and/or diagnosis of SARS-CoV-2 by FDA under an Emergency Use Authorization (EUA). This EUA will remain in effect (meaning this test can be used) for the duration of the COVID-19 declaration under Section 564(b)(1) of the Act, 21 U.S.C. section 360bbb-3(b)(1), unless the authorization is terminated or revoked.     Resp Syncytial Virus by PCR NEGATIVE NEGATIVE Final    Comment: (NOTE) Fact Sheet  for Patients: BloggerCourse.com  Fact Sheet for Healthcare Providers: SeriousBroker.it  This test is not yet approved or cleared by the [place] FDA and has been authorized for detection and/or diagnosis of SARS-CoV-2 by FDA under an Emergency Use Authorization (EUA). This EUA will remain in effect (meaning this test can be used) for the duration of the COVID-19 declaration under Section 564(b)(1) of the Act, 21 U.S.C. section 360bbb-3(b)(1), unless the authorization is terminated or revoked.  Performed at [facility], [address], [place], [place] [address]   Culture, blood (Routine X 2) w Reflex to ID Panel     Status: None (Preliminary result)   Collection Time: 10/03/23  5:26 AM   Specimen: BLOOD LEFT HAND  Result Value Ref Range Status   Specimen Description BLOOD LEFT HAND  Final   Special Requests   Final    BOTTLES DRAWN AEROBIC AND ANAEROBIC Blood Culture adequate volume   Culture   Final    NO GROWTH 3 DAYS Performed at [facility], [address], [place], [place] [address]    Report Status PENDING  Incomplete  Culture, blood (Routine X 2) w Reflex to ID Panel     Status: None (Preliminary result)   Collection Time: 10/03/23  5:29 AM   Specimen: BLOOD RIGHT HAND  Result Value Ref Range Status   Specimen Description BLOOD RIGHT HAND  Final   Special Requests   Final    BOTTLES DRAWN AEROBIC AND ANAEROBIC Blood Culture adequate volume   Culture   Final    NO GROWTH 3 DAYS Performed at [facility], [address], [place], [place] [address]    Report Status PENDING  Incomplete  Urine Culture (for pregnant, neutropenic or urologic patients or patients with an indwelling urinary catheter)     Status: None (Preliminary result)   Collection Time: 10/04/23  9:09 AM   Specimen: Urine, Clean Catch  Result Value Ref Range Status   Specimen Description URINE, CLEAN CATCH  Final   Special  Requests NONE  Final   Culture   Final    CULTURE REINCUBATED FOR BETTER GROWTH Performed at [facility], [address], [place], [place] [address]    Report Status PENDING  Incomplete  Aerobic/Anaerobic Culture w Gram Stain (surgical/deep wound)     Status: None (Preliminary result)   Collection Time: 10/04/23  4:10 PM   Specimen: Gallbladder; Bile  Result Value Ref Range Status   Specimen Description GALL BLADDER  Final   Special Requests NONE  Final   Gram Stain   Final    RARE WBC PRESENT, PREDOMINANTLY PMN FEW GRAM NEGATIVE RODS    Culture   Final    MODERATE CITROBACTER KOSERI SUSCEPTIBILITIES TO FOLLOW Performed at [facility], [address] [place], [place] [address]    Report Status PENDING  Incomplete    Antimicrobials: Anti-infectives (From admission, onward)    Start     Dose/Rate Route Frequency Ordered Stop   10/05/23 0000  cefOXitin (MEFOXIN) 2 g in sodium chloride 0.9 % 100 mL IVPB  Status:  Discontinued        2 g 200 mL/hr over 30 Minutes Intravenous To Radiology 10/04/23 1450 10/04/23 1455   10/04/23 1545  cefOXitin (MEFOXIN) 2 g in sodium chloride 0.9 % 100 mL IVPB  Status:  Discontinued        2 g 200 mL/hr over 30 Minutes Intravenous To Radiology 10/04/23 1455 10/04/23 1718   10/03/23 0600  cefTRIAXone (ROCEPHIN) 2 g in sodium chloride 0.9 % 100 mL IVPB       Note to Pharmacy: Start after blood cultures drawn   2 g 200 mL/hr over 30 Minutes Intravenous Every 24 hours 10/03/23 0514     10/03/23 0600  metroNIDAZOLE (FLAGYL) tablet 500 mg  Status:  Discontinued        500 mg Oral Every 12 hours 10/03/23 0514 10/05/23 9629      Culture/Microbiology Radiology Studies: IR Perc Cholecystostomy  Result Date: 10/04/2023 INDICATION: 77 year old male with acute calculus cholecystitis EXAM: CHOLECYSTOSTOMY MEDICATIONS: None ANESTHESIA/SEDATION: Moderate (conscious) sedation was not employed during this procedure. A total of Versed 0 mg and  Fentanyl 50 mcg was administered intravenously. Moderate Sedation Time: 0 minutes. The patient's level of consciousness and vital signs were monitored continuously by radiology nursing throughout the procedure under my direct supervision. FLUOROSCOPY TIME:  Fluoroscopy Time:  (11 mGy). COMPLICATIONS: None PROCEDURE: Informed written consent was obtained from the patient and the patient's family after a thorough discussion of the procedural risks, benefits and alternatives. All questions were addressed. Maximal Sterile Barrier Technique was utilized including caps, mask, sterile gowns, sterile gloves, sterile drape, hand hygiene and skin antiseptic. A timeout was performed prior to the initiation of the procedure. Ultrasound survey  of the right upper quadrant was performed for planning purposes. Once the patient is prepped and draped in the usual sterile fashion, the skin and subcutaneous tissues overlying the gallbladder were generously infiltrated 1% lidocaine for local anesthesia. A coaxial needle was advanced under ultrasound guidance through the skin subcutaneous tissues and a small segment of liver into the gallbladder lumen. With removal of the stylet, spontaneous dark bile drainage occurred. Using modified Seldinger technique, a 10 French drain was placed into the gallbladder fossa, with aspiration of the sample for the lab. Contrast injection confirmed position of the tube within the gallbladder lumen. Drainage catheter was attached to gravity drain with a suture retention placed. Patient tolerated the procedure well and remained hemodynamically stable throughout. No complications were encountered and no significant blood loss encountered. IMPRESSION: Status post percutaneous cholecystostomy Signed, Yvone Neu. Miachel Roux, RPVI [facility] Radiology Electronically Signed   By: Gilmer Mor D.O.   On: 10/04/2023 16:59   DG CHEST PORT 1 VIEW  Result  Date: 10/04/2023 CLINICAL DATA:  [id number] Hypoxia [id number] EXAM: PORTABLE CHEST 1 VIEW COMPARISON:  Chest XR, 10/03/2023.  CT AP, 09/19/2023. FINDINGS: The cardiac apex is obscured. Aortic arch atherosclerosis. Hypoinflation the RIGHT lung is clear. Small volume LEFT pleural effusion with retrocardiac basilar consolidation. No pneumothorax. No acute osseous abnormality. IMPRESSION: 1. Small volume LEFT pleural effusion. 2. Dependent LEFT basilar consolidation is likely to represent atelectasis, however superimposed pneumonia can appear similar. 3.  Aortic Atherosclerosis (ICD10-I70.0). Electronically Signed   By: Roanna Banning M.D.   On: 10/04/2023 14:23   NM Hepatobiliary Liver Func  Result Date: 10/04/2023 CLINICAL DATA:  RIGHT upper quadrant pain. Concern for acalculous cholecystitis. EXAM: NUCLEAR MEDICINE HEPATOBILIARY IMAGING TECHNIQUE: Sequential images of the abdomen were obtained out to 60 minutes following intravenous administration of radiopharmaceutical. RADIOPHARMACEUTICALS:  5.4 mCi Tc-64m  Choletec IV COMPARISON:  Ultrasound 10/03/2023 FINDINGS: Prompt clearance radiotracer from blood pool and homogeneous uptake in liver. Counts are evident within the small bowel by 20 minutes. Gallbladder failed to fill over 90 minutes of imaging (extended pre morphine time of 90 minutes performed as study was ordered with ejection fraction) At 90 minutes, 3 mg of IV morphine were administered. gallbladder failed to fill after morphine augmentation. IMPRESSION: 1. Non filling of the gallbladder most consistent with acute cholecystitis. 2. Patent common duct. These results will be called to the ordering clinician or representative by the Radiologist Assistant, and communication documented in the PACS or [employer]. Electronically Signed   By: Genevive Bi M.D.   On: 10/04/2023 12:56     LOS: 16 days   Lanae Boast, MD [facility]  10/06/2023, 10:52 AM

## 2023-10-06 NOTE — Progress Notes (Signed)
Referring Physician(s): Dr. Henderson Newcomer, B PA-C  Supervising Physician: Gilmer Mor  Patient Status:  [facility] - In-pt  Chief Complaint: Cholecystitis  Subjective:  Patient's comfortable and asleep on rounds. Drain flush and aspirate easily.  Allergies: Terazosin hcl  Medications: Prior to Admission medications   Medication Sig Start Date End Date Taking? Authorizing Provider  acetaminophen (TYLENOL) 325 MG tablet Take 2 tablets (650 mg total) by mouth every 6 (six) hours as needed for mild pain (or Fever >/= 101). 06/03/19  Yes Emokpae, Courage, MD  ALPRAZolam (XANAX) 0.5 MG tablet Take 0.5 mg by mouth 2 (two) times daily as needed for anxiety.   Yes [provider]  amLODipine (NORVASC) 10 MG tablet Take 10 mg by mouth daily. 08/19/23  Yes [provider]  apixaban (ELIQUIS) 5 MG TABS tablet Take 1 tablet (5 mg total) by mouth 2 (two) times daily. Blood thinner for stroke prevention 06/03/19  Yes Emokpae, Courage, MD  brimonidine-timolol (COMBIGAN) 0.2-0.5 % ophthalmic solution Place 1 drop into the left eye daily. 02/29/16  Yes [provider]  buPROPion (WELLBUTRIN XL) 300 MG 24 hr tablet Take 300 mg by mouth daily.   Yes [provider]  Cholecalciferol (VITAMIN D3) 3000 UNITS TABS Take 1 tablet by mouth daily.    Yes [provider]  clopidogrel (PLAVIX) 75 MG tablet Take 75 mg by mouth daily.   Yes [provider]  cyanocobalamin (VITAMIN B12) 100 MCG tablet Take 100 mcg by mouth daily.   Yes [provider]  escitalopram (LEXAPRO) 10 MG tablet Take 10 mg by mouth daily.   Yes [provider]  metoprolol succinate (TOPROL-XL) 25 MG 24 hr tablet Take 25 mg by mouth daily. 08/22/23  Yes [provider]  nitroGLYCERIN (NITROSTAT) 0.4 MG SL tablet Place 0.4 mg under the tongue every 5 (five) minutes as needed for chest pain.   Yes [provider]  Omega-3 Fatty Acids (FISH OIL PO) Take 1 capsule by  mouth daily.    Yes [provider]  pantoprazole (PROTONIX) 40 MG tablet Take 40 mg by mouth daily.   Yes [provider]  rosuvastatin (CRESTOR) 40 MG tablet Take 40 mg by mouth daily.   Yes [provider]  sacubitril-valsartan (ENTRESTO) 24-26 MG Take 1 tablet by mouth daily.   Yes [provider]  vitamin C (ASCORBIC ACID) 500 MG tablet Take 1 tablet (500 mg total) by mouth daily. 09/25/12  Yes Bradly Bienenstock, MD     Vital Signs: BP 119/88 (BP Location: Right Arm)   Pulse (!) 114   Temp 98 F (36.7 C) (Oral)   Resp 16   Ht 5\' 10"  (1.778 m)   Wt 210 lb 12.2 oz (95.6 kg)   SpO2 94%   BMI 30.24 kg/m   Physical Exam Skin:    General: Skin is warm and dry.     Comments: Drain site c/d/I Blood in output       Imaging: IR Perc Cholecystostomy  Result Date: 10/04/2023 INDICATION: 77 year old male with acute calculus cholecystitis EXAM: CHOLECYSTOSTOMY MEDICATIONS: None ANESTHESIA/SEDATION: Moderate (conscious) sedation was not employed during this procedure. A total of Versed 0 mg and Fentanyl 50 mcg was administered intravenously. Moderate Sedation Time: 0 minutes. The patient's level of consciousness and vital signs were monitored continuously by radiology nursing throughout the procedure under my direct supervision. FLUOROSCOPY TIME:  Fluoroscopy Time:  (11 mGy). COMPLICATIONS: None PROCEDURE: Informed written consent was obtained from the  patient and the patient's family after a thorough discussion of the procedural risks, benefits and alternatives. All questions were addressed. Maximal Sterile Barrier Technique was utilized including caps, mask, sterile gowns, sterile gloves, sterile drape, hand hygiene and skin antiseptic. A timeout was performed prior to the initiation of the procedure. Ultrasound survey of the right upper quadrant was performed for planning purposes. Once the patient is prepped and draped in the usual sterile fashion, the skin  and subcutaneous tissues overlying the gallbladder were generously infiltrated 1% lidocaine for local anesthesia. A coaxial needle was advanced under ultrasound guidance through the skin subcutaneous tissues and a small segment of liver into the gallbladder lumen. With removal of the stylet, spontaneous dark bile drainage occurred. Using modified Seldinger technique, a 10 French drain was placed into the gallbladder fossa, with aspiration of the sample for the lab. Contrast injection confirmed position of the tube within the gallbladder lumen. Drainage catheter was attached to gravity drain with a suture retention placed. Patient tolerated the procedure well and remained hemodynamically stable throughout. No complications were encountered and no significant blood loss encountered. IMPRESSION: Status post percutaneous cholecystostomy Signed, Yvone Neu. Miachel Roux, RPVI [facility] Radiology Electronically Signed   By: Gilmer Mor D.O.   On: 10/04/2023 16:59   DG CHEST PORT 1 VIEW  Result Date: 10/04/2023 CLINICAL DATA:  [id number] Hypoxia [id number] EXAM: PORTABLE CHEST 1 VIEW COMPARISON:  Chest XR, 10/03/2023.  CT AP, 09/19/2023. FINDINGS: The cardiac apex is obscured. Aortic arch atherosclerosis. Hypoinflation the RIGHT lung is clear. Small volume LEFT pleural effusion with retrocardiac basilar consolidation. No pneumothorax. No acute osseous abnormality. IMPRESSION: 1. Small volume LEFT pleural effusion. 2. Dependent LEFT basilar consolidation is likely to represent atelectasis, however superimposed pneumonia can appear similar. 3.  Aortic Atherosclerosis (ICD10-I70.0). Electronically Signed   By: Roanna Banning M.D.   On: 10/04/2023 14:23   NM Hepatobiliary Liver Func  Result Date: 10/04/2023 CLINICAL DATA:  RIGHT upper quadrant pain. Concern for acalculous cholecystitis. EXAM: NUCLEAR MEDICINE HEPATOBILIARY IMAGING TECHNIQUE: Sequential images of the abdomen  were obtained out to 60 minutes following intravenous administration of radiopharmaceutical. RADIOPHARMACEUTICALS:  5.4 mCi Tc-58m  Choletec IV COMPARISON:  Ultrasound 10/03/2023 FINDINGS: Prompt clearance radiotracer from blood pool and homogeneous uptake in liver. Counts are evident within the small bowel by 20 minutes. Gallbladder failed to fill over 90 minutes of imaging (extended pre morphine time of 90 minutes performed as study was ordered with ejection fraction) At 90 minutes, 3 mg of IV morphine were administered. gallbladder failed to fill after morphine augmentation. IMPRESSION: 1. Non filling of the gallbladder most consistent with acute cholecystitis. 2. Patent common duct. These results will be called to the ordering clinician or representative by the Radiologist Assistant, and communication documented in the PACS or [employer]. Electronically Signed   By: Genevive Bi M.D.   On: 10/04/2023 12:56   DG CHEST PORT 1 VIEW  Result Date: 10/03/2023 CLINICAL DATA:  Fever. EXAM: PORTABLE CHEST 1 VIEW COMPARISON:  09/19/2023 FINDINGS: Right jugular dialysis catheter with the tip near the superior cavoatrial junction. Increased densities at the left lung base could represent pleural fluid and consolidation. Heart size is upper limits of normal. Prominent central vascular markings. Negative for a pneumothorax. Patchy densities in the right lower lobe are suggestive for atelectasis. IMPRESSION: 1. New densities at the left lung base. Findings could represent pleural fluid and consolidation. Infection cannot be excluded at the left lung base. 2.  Probable right basilar atelectasis. Electronically Signed   By: Richarda Overlie M.D.   On: 10/03/2023 11:53   US Abdomen Limited RUQ (LIVER/GB)  Result Date: 10/03/2023 CLINICAL DATA:  Right upper quadrant abdominal pain EXAM: ULTRASOUND ABDOMEN LIMITED RIGHT UPPER QUADRANT COMPARISON:  Abdominal CT 09/19/2023. FINDINGS: Gallbladder: Avascular sludge in the  gallbladder. There is focal tenderness in a 5 mm wall thickness with moderate distension. No gallstone detected. Common bile duct: Diameter: 3 mm Liver: No focal lesion identified. Within normal limits in parenchymal echogenicity. Portal vein is patent on color Doppler imaging with normal direction of blood flow towards the liver. IMPRESSION: Tender gallbladder with mild wall thickening but no underlying gallstone. Gallbladder sludge. Electronically Signed   By: Tiburcio Pea M.D.   On: 10/03/2023 07:41    Labs:  CBC: Recent Labs    10/03/23 0415 10/04/23 0419 10/05/23 0512 10/06/23 0416  WBC 20.1* 19.7* 15.1* 13.1*  HGB 8.5* 8.0* 7.8* 7.4*  HCT 25.2* 24.9* 24.1* 23.4*  PLT 374 402* 394 460*    COAGS: Recent Labs    09/19/23 1912 10/04/23 1436  INR 1.5* 2.8*    BMP: Recent Labs    10/03/23 0422 10/04/23 0419 10/05/23 0512 10/06/23 0416  NA 135 136 137 137  K 3.6 4.0 4.1 3.7  CL 95* 96* 100 100  CO2 29 28 25 26   GLUCOSE 111* 100* 86 87  BUN 71* 79* 85* 92*  CALCIUM 8.6* 8.5* 8.1* 8.4*  CREATININE 4.69* 4.78* 4.82* 4.69*  GFRNONAA 12* 12* 12* 12*    LIVER FUNCTION TESTS: Recent Labs    09/21/23 0428 09/22/23 0527 09/23/23 0500 09/25/23 0328 09/26/23 0536 10/03/23 0526 10/04/23 0419 10/05/23 0512 10/06/23 0416  BILITOT 0.6 0.6  --  1.3*  --  0.9  --   --   --   AST 233* 198*  --  104*  --  18  --   --   --   ALT 47* 45*  --  37  --  19  --   --   --   ALKPHOS 31* 36*  --  35*  --  42  --   --   --   PROT 4.8* 4.7*  --  4.3*  --  5.3*  --   --   --   ALBUMIN 2.2* 2.2*   < > 1.7*   < > 2.0* 1.8* 1.7* 1.7*   < > = values in this interval not displayed.    Drain Location: RUQ Size: Fr size: 10 Fr Date of placement: 10/04/23  Currently to: Drain collection device: gravity 24 hour output:  Output by Drain (mL) 10/03/23 0701- 10/04/23 1900 10/04/23 1901 - 10/05/23 0700 10/04/23 0701 - 10/04/23 1900 10/06/23 1901 - 10/05/23 0700 10/06/23 0701 - 10/05/23  0731  Closed System Drain 1 Right;Lateral Abdomen Other (Comment) 10 Fr.    5 5   95      Interval imaging/drain manipulation:  None   Current examination: Mild output, mainly bloody. Flushes/aspirates easily.  Insertion site unremarkable. Suture and stat lock in place. Dressed appropriately.    Plan: Continue TID flushes with 5 cc NS. Record output Q shift. Dressing changes QD or PRN if soiled.    Discharge planning: Please contact IR APP or on call IR MD prior to patient d/c to ensure appropriate follow up plans are in place. Typically patient will follow up with IR clinic 6-8 weeks post d/c for repeat imaging/possible drain injection. IR  scheduler will contact patient with date/time of appointment. Patient will need to flush drain QD with 5 cc NS, record output QD, dressing changes every 2-3 days or earlier if soiled.    IR will continue to follow - please call with questions or concerns.  Electronically Signed: Sable Feil, PA-C 10/06/2023, 8:06 AM   I spent a total of 15 Minutes at the the patient's bedside AND on the patient's hospital floor or unit, greater than 50% of which was counseling/coordinating care for percutaneous cholecystostomy drain
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
## 2023-10-06 NOTE — Plan of Care (Signed)
  Problem: Activity: Goal: Risk for activity intolerance will decrease Outcome: Progressing   Problem: Nutrition: Goal: Adequate nutrition will be maintained Outcome: Progressing   Problem: Coping: Goal: Level of anxiety will decrease Outcome: Progressing   Problem: Elimination: Goal: Will not experience complications related to bowel motility Outcome: Progressing   Problem: Elimination: Goal: Will not experience complications related to urinary retention Outcome: Progressing   Problem: Safety: Goal: Ability to remain free from injury will improve Outcome: Progressing   Problem: Skin Integrity: Goal: Risk for impaired skin integrity will decrease Outcome: Progressing

## 2023-10-07 DIAGNOSIS — N179 Acute kidney failure, unspecified: Secondary | ICD-10-CM | POA: Diagnosis not present

## 2023-10-07 DIAGNOSIS — N1832 Chronic kidney disease, stage 3b: Secondary | ICD-10-CM | POA: Diagnosis not present

## 2023-10-07 LAB — RENAL FUNCTION PANEL
Albumin: 2.2 g/dL — ABNORMAL LOW (ref 3.5–5.0)
Anion gap: 12 (ref 5–15)
BUN: 96 mg/dL — ABNORMAL HIGH (ref 8–23)
CO2: 25 mmol/L (ref 22–32)
Calcium: 8.4 mg/dL — ABNORMAL LOW (ref 8.9–10.3)
Chloride: 103 mmol/L (ref 98–111)
Creatinine, Ser: 4.23 mg/dL — ABNORMAL HIGH (ref 0.61–1.24)
GFR, Estimated: 14 mL/min — ABNORMAL LOW (ref >60)
Glucose, Bld: 91 mg/dL (ref 70–99)
Phosphorus: 6 mg/dL — ABNORMAL HIGH (ref 2.5–4.6)
Potassium: 3.6 mmol/L (ref 3.5–5.1)
Sodium: 140 mmol/L (ref 135–145)

## 2023-10-07 LAB — URINE CULTURE: Culture: 70000 — AB

## 2023-10-07 LAB — CBC
HCT: 22.8 % — ABNORMAL LOW (ref 39.0–52.0)
Hemoglobin: 7.1 g/dL — ABNORMAL LOW (ref 13.0–17.0)
MCH: 29 pg (ref 26.0–34.0)
MCHC: 31.1 g/dL (ref 30.0–36.0)
MCV: 93.1 fL (ref 80.0–100.0)
Platelets: 437 10*3/uL — ABNORMAL HIGH (ref 150–400)
RBC: 2.45 MIL/uL — ABNORMAL LOW (ref 4.22–5.81)
RDW: 14 % (ref 11.5–15.5)
WBC: 9.2 10*3/uL (ref 4.0–10.5)
nRBC: 0 % (ref 0.0–0.2)

## 2023-10-07 LAB — HEMOGLOBIN AND HEMATOCRIT, BLOOD
HCT: 24.9 % — ABNORMAL LOW (ref 39.0–52.0)
Hemoglobin: 8 g/dL — ABNORMAL LOW (ref 13.0–17.0)

## 2023-10-07 LAB — PREPARE RBC (CROSSMATCH)

## 2023-10-07 MED ORDER — SODIUM CHLORIDE 0.9% IV SOLUTION: Freq: Once | INTRAVENOUS | Status: AC

## 2023-10-07 MED ORDER — ALBUMIN HUMAN 25 % IV SOLN
25.0000 g | Freq: Once | INTRAVENOUS | Status: AC
  Administered 2023-10-07: 25 g via INTRAVENOUS
  Filled 2023-10-07: qty 100

## 2023-10-07 MED ORDER — SODIUM CHLORIDE 0.9 % IV SOLN
3.0000 g | Freq: Two times a day (BID) | INTRAVENOUS | Status: DC
  Administered 2023-10-07 – 2023-10-11 (×8): 3 g via INTRAVENOUS
  Filled 2023-10-07 (×9): qty 8

## 2023-10-07 NOTE — Plan of Care (Signed)
  Problem: Activity: Goal: Risk for activity intolerance will decrease Outcome: Progressing   Problem: Nutrition: Goal: Adequate nutrition will be maintained Outcome: Progressing   Problem: Coping: Goal: Level of anxiety will decrease Outcome: Progressing   Problem: Elimination: Goal: Will not experience complications related to bowel motility Outcome: Progressing   Problem: Pain Management: Goal: General experience of comfort will improve Outcome: Progressing   Problem: Safety: Goal: Ability to remain free from injury will improve Outcome: Progressing   Problem: Skin Integrity: Goal: Risk for impaired skin integrity will decrease Outcome: Progressing

## 2023-10-07 NOTE — Progress Notes (Signed)
PROGRESS NOTE Jorge DEE  West:[id number] DOB: January 10, 1946 DOA: 09/19/2023 PCP: Elizabeth Palau, FNP  Brief Narrative/Hospital Course: 77 year old with history of CHF, A-fib, CAD, HTN, CKD, BPH presenting with left lower extremity trauma Anteroapical and then falling on the floor. Apparently was trapped for about 2 hours until family found him. Upon admission CT head, cervical spine negative, CT chest abdomen pelvis showed acute T12 compression fracture, CT of lower extremity showed asymmetric enlargement of muscle on the left.  Patient was admitted for left femoral condyle avulsion fracture, severe rhabdomyolysis and AKI on CKD. Patient started on dialysis.  Nephrology is following closely.  Patient had fever episode along with right upper quadrant abdominal pain, [place] 11/12-mild wall thickening gallbladder but no gallstone, placed on antibiotics HIDA scan obtained 11/13> was positive for acute cholecystitis-CCS was consulted, given his comorbidities felt to be high risk for surgical intervention IR consulted and cholecystostomy tube placed 11/13.   Subjective: Patient seen examined this morning Wife at the bedside-he states he is drinking better but he still appears weak frail Afebrile BP has been stable, on 2 L nasal cannula. HR overall doing well in 90s to 100s Labs shows hemoglobin continues to drift 7.1 g  creat improving 4.2 No bowel movement but passing gas  Assessment and Plan: Principal Problem:   Acute kidney injury superimposed on stage 3b chronic kidney disease (HCC) Active Problems:   Atrial fibrillation with RVR (HCC)   Essential hypertension   Closed left femoral condyle avulsion fracture (HCC)   Coronary artery disease   Anemia of chronic disease   Malnutrition of moderate degree   Persistent atrial fibrillation (HCC)  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  Right internal jugular tunneled HD cath  11/1- removed 11/12.  Encourage oral hydration.  Some volume depletion noticed given IV albumin 11/15 creatinine downtrending, additional PRBC transfusion should help as well.  Continue to monitor renal function, nephrology following closely.  He does have CHF caution with IV fluids.  Advance diet Recent Labs    09/28/23 0520 09/29/23 0510 09/30/23 0441 10/01/23 0500 10/02/23 0515 10/03/23 0422 10/04/23 0419 10/05/23 0512 10/06/23 0416 10/07/23 0344  BUN 61* 69* 69* 68* 63* 71* 79* 85* 92* 96*  CREATININE 6.17* 6.48* 6.34* 5.66* 4.98* 4.69* 4.78* 4.82* 4.69* 4.23*  CO2 29 29 28 28 29 29 28 25 26  25  K 3.6 3.4* 3.8 3.8 3.5 3.6 4.0 4.1 3.7 3.6   Intake/Output Summary (Last 24 hours) at 10/07/2023 1142 Last data filed at 10/07/2023 8119 Gross per 24 hour  Intake 296.53 ml  Output 1000 ml  Net -703.47 ml     Traumatic rhabdomyolysis: CK has improved overall continue oral hydration  Acute cholecystitis: Patient had fever episode along with abdominal pain, leukocytosis> [place] 11/12-mild wall thickening gallbladder but no gallstone, placed on antibiotics HIDA scan obtained 11/13> was positive for acute cholecystitis-CCS was consulted, given his comorbidities felt to be high risk for surgical intervention  s/p IR cholecystostomy tube placed 11/13. ulture with Citrobacter, blood culture 11/2 NGTD Pain is improving, WBC count normalized.  On IV antibiotics w/ ceftriaxone-discussed with pharmacy to switch antibiotics to cover UTI as well . Cont DYS 3 diet.  Recent Labs  Lab 10/03/23 0415 10/03/23 0526 10/04/23 0419 10/05/23 0512 10/06/23 0416 10/07/23 0344  WBC 20.1*  --  19.7* 15.1* 13.1* 9.2  LATICACIDVEN  --  0.9  --   --   --   --  Pyuria-Enterococcus faecalis on culture: Switch abx will discuss with pharmacy, he is having burning urination.    Hypokalemia: Resolved  Permanent atrial fibrillation with RVR: Heart rate remains borderline controlled.Continue amiodarone,  Toprol-XL.  Eliquis remains on hold after discussion with cardiology, and no need for bridging.  If hemoglobin is stable 11/16 will resume Eliquis -previously blood pressure had been soft limiting the titration of beta-blocker-BP better now.  CAD w/ PCI placed 2 months ago: No chest pain. On Plavix.  Essential hypertension: BP overall improved no more hypotension, he is on midodrine holding Entresto   Scrotal edema Elevate   Acute hypoxic respiratory failure: Multifactorial in the setting of CHF, and acute cholecystitis and abdominal pain, continue supplement oxygen, encourage I-S PT OT   Acute on chronic diastolic CHF: TTE- LVEF:60- to 65%, mild LVH, RV with normal systolic function,no significant valvular disease. Cont metoprolol. Wean off ivf as able to eat.Avoiding ARB/ARNI due to AKI  Net IO Since Admission: 4,384.49 mL [10/07/23 1142]   Closed left femoral condyle avulsion fracture T12 compression fracture: Continue PT OT and pain control.  Seen by orthopedics WBAT and follow-up outpatient with Dr. Zoe Lan need reconstructive surgery down the road but no acute surgical recommendation advised this admission   Anemia of chronic disease ABLA: Hemoglobin fluctuating, remains high risk for ABLA-given his Eliquis and plavx-needed urgent procedure w/ IR cholecystostomy tube having serosanguineous drainage.  Will transfuse 1 unit PRBC discussed risk benefits and alternatives and agreeable . Recent Labs  Lab 10/03/23 0415 10/04/23 0419 10/05/23 0512 10/06/23 0416 10/07/23 0344  HGB 8.5* 8.0* 7.8* 7.4* 7.1*  HCT 25.2* 24.9* 24.1* 23.4* 22.8*    Malnutrition of moderate degree Continue nutritional supplements Nutrition Problem: Moderate Malnutrition Etiology: chronic illness (heart failure, afib) Signs/Symptoms: mild fat depletion, mild muscle depletion Interventions: Ensure Enlive (each supplement provides 350kcal and 20 grams of protein), MVI, Magic cup, Education  .   Obesity:Patient's Body mass index is 30.24 kg/m. : Will benefit with PCP follow-up, weight loss  healthy lifestyle and outpatient sleep evaluation.  DVT prophylaxis: SCDs Start: 09/20/23 0018 Place TED hose Start: 09/20/23 0018 eliquis Code Status:   Code Status: Full Code Family Communication: plan of care discussed with patient/wife updated at bedside  in details Patient status is: Inpatient because of AKI Level of care: Telemetry Cardiac   Dispo: The patient is from: home w/ wife            Anticipated disposition: TBD Objective: Vitals last 24 hrs: Vitals:   10/07/23 0854 10/07/23 1000 10/07/23 1117 10/07/23 1138  BP: (!) 135/96 (!) 137/99 (!) 134/97 (!) 137/93  Pulse: (!) 105 98 90   Resp:   18   Temp:   97.7 F (36.5 C) 97.8 F (36.6 C)  TempSrc:   Oral Oral  SpO2:  94% 94%   Weight:      Height:       Weight change:   Physical Examination: General exam: alert awake, weak. HEENT:Oral mucosa moist, Ear/Nose WNL grossly Respiratory system: Bilaterally clear BS,no use of accessory muscle Cardiovascular system: S1 & S2 +, No JVD. Gastrointestinal system: Abdomen soft,NT,ND, BS+ RUQ drain+ Nervous System: Alert, awake, moving all extremities,and following commands. Extremities: LE edema neg,distal peripheral pulses palpable and warm.  Skin: No rashes,no icterus. MSK: Normal muscle bulk,tone, power   Medications reviewed:  Scheduled Meds:  sodium chloride   Intravenous Once   amiodarone  200 mg Oral BID   brimonidine  1 drop Left Eye Daily  And   timolol  1 drop Left Eye Daily   buPROPion  300 mg Oral Daily   cholecalciferol  1,000 Units Oral Daily   clopidogrel  75 mg Oral Daily   docusate sodium  100 mg Oral Daily   escitalopram  10 mg Oral Daily   feeding supplement  1 Container Oral TID BM   metoprolol succinate  25 mg Oral Daily   midodrine  10 mg Oral TID WC   multivitamin  1 tablet Oral QHS   polyethylene glycol  17 g Oral Daily   senna-docusate  1  tablet Oral QHS   sodium chloride flush  3 mL Intravenous Q12H   sodium chloride flush  5 mL Intracatheter Q8H   cyanocobalamin  100 mcg Oral Daily  Continuous Infusions:  cefTRIAXone (ROCEPHIN)  IV 2 g (10/07/23 0517)    Diet Order             DIET DYS 3 Room service appropriate? Yes; Fluid consistency: Thin  Diet effective now                   Intake/Output Summary (Last 24 hours) at 10/07/2023 1142 Last data filed at 10/07/2023 0917 Gross per 24 hour  Intake 296.53 ml  Output 1000 ml  Net -703.47 ml   Net IO Since Admission: 4,384.49 mL [10/07/23 1142]  Wt Readings from Last 3 Encounters:  10/04/23 95.6 kg  09/05/23 84.4 kg  01/31/21 89.4 kg     Unresulted Labs (From admission, onward)     Start     Ordered   09/19/23 2017  Prepare fresh frozen plasma  (**Emergency Blood Administration**)  ONCE - STAT,   STAT       Comments: Emergent release   Question Answer Comment  # of Units 2 units   Special Requirements Other (Specify)   Emergent release call blood bank [facility] [phone]   Attestation: Emergent transfusion, the ordering licensed practitioner has determined that the risk of delaying transfusion outweighs the risk of transfusing incompatible or incompletely tested units.     Placed in "And" Linked Group   09/19/23 2016          Data Reviewed: I have personally reviewed following labs and imaging studies CBC: Recent Labs  Lab 10/03/23 0415 10/04/23 0419 10/05/23 0512 10/06/23 0416 10/07/23 0344  WBC 20.1* 19.7* 15.1* 13.1* 9.2  HGB 8.5* 8.0* 7.8* 7.4* 7.1*  HCT 25.2* 24.9* 24.1* 23.4* 22.8*  MCV 89.7 91.5 92.3 91.4 93.1  PLT 374 402* 394 460* 437*   Basic Metabolic Panel: Recent Labs  Lab 10/01/23 0410 10/01/23 0500 10/02/23 0515 10/03/23 0415 10/03/23 0422 10/04/23 0419 10/05/23 0512 10/06/23 0416 10/07/23 0344  NA  --    < > 134*  --  135 136 137 137 140  K  --    < > 3.5  --  3.6 4.0 4.1 3.7 3.6  CL  --    < > 96*  --   95* 96* 100 100 103  CO2  --    < > 29  --  29 28 25 26 25   GLUCOSE  --    < > 115*  --  111* 100* 86 87 91  BUN  --    < > 63*  --  71* 79* 85* 92* 96*  CREATININE  --    < > 4.98*  --  4.69* 4.78* 4.82* 4.69* 4.23*  CALCIUM  --    < >  8.5*  --  8.6* 8.5* 8.1* 8.4* 8.4*  MG 2.0  --  1.9 1.8  --  1.9  --   --   --   PHOS  --    < > 5.4*  --  5.0* 6.0* 7.0* 6.1* 6.0*   < > = values in this interval not displayed.   GFR: Estimated Creatinine Clearance: 17 mL/min (A) (by C-G formula based on SCr of 4.23 mg/dL (H)). Liver Function Tests: Recent Labs  Lab 10/03/23 0526 10/04/23 0419 10/05/23 0512 10/06/23 0416 10/07/23 0344  AST 18  --   --   --   --   ALT 19  --   --   --   --   ALKPHOS 42  --   --   --   --   BILITOT 0.9  --   --   --   --   PROT 5.3*  --   --   --   --   ALBUMIN 2.0* 1.8* 1.7* 1.7* 2.2*   Recent Results (from the past 240 hour(s))  Resp panel by RT-PCR (RSV, Flu A&B, Covid) Anterior Nasal Swab     Status: None   Collection Time: 10/03/23  5:18 AM   Specimen: Anterior Nasal Swab  Result Value Ref Range Status   SARS Coronavirus 2 by RT PCR NEGATIVE NEGATIVE Final   Influenza A by PCR NEGATIVE NEGATIVE Final   Influenza B by PCR NEGATIVE NEGATIVE Final    Comment: (NOTE) The Xpert Xpress SARS-CoV-2/FLU/RSV plus assay is intended as an aid in the diagnosis of influenza from Nasopharyngeal swab specimens and should not be used as a sole basis for treatment. Nasal washings and aspirates are unacceptable for Xpert Xpress SARS-CoV-2/FLU/RSV testing.  Fact Sheet for Patients: BloggerCourse.com  Fact Sheet for Healthcare Providers: SeriousBroker.it  This test is not yet approved or cleared by the [place] FDA and has been authorized for detection and/or diagnosis of SARS-CoV-2 by FDA under an Emergency Use Authorization (EUA). This EUA will remain in effect (meaning this test can be used) for the duration  of the COVID-19 declaration under Section 564(b)(1) of the Act, 21 U.S.C. section 360bbb-3(b)(1), unless the authorization is terminated or revoked.     Resp Syncytial Virus by PCR NEGATIVE NEGATIVE Final    Comment: (NOTE) Fact Sheet for Patients: BloggerCourse.com  Fact Sheet for Healthcare Providers: SeriousBroker.it  This test is not yet approved or cleared by the [place] FDA and has been authorized for detection and/or diagnosis of SARS-CoV-2 by FDA under an Emergency Use Authorization (EUA). This EUA will remain in effect (meaning this test can be used) for the duration of the COVID-19 declaration under Section 564(b)(1) of the Act, 21 U.S.C. section 360bbb-3(b)(1), unless the authorization is terminated or revoked.  Performed at [facility], [address] [place], [place] [address]   Culture, blood (Routine X 2) w Reflex to ID Panel     Status: None (Preliminary result)   Collection Time: 10/03/23  5:26 AM   Specimen: BLOOD LEFT HAND  Result Value Ref Range Status   Specimen Description BLOOD LEFT HAND  Final   Special Requests   Final    BOTTLES DRAWN AEROBIC AND ANAEROBIC Blood Culture adequate volume   Culture   Final    NO GROWTH 4 DAYS Performed at [facility], [address] [place], [place] [address]    Report Status PENDING  Incomplete  Culture, blood (Routine X  2) w Reflex to ID Panel     Status: None (Preliminary result)   Collection Time: 10/03/23  5:29 AM   Specimen: BLOOD RIGHT HAND  Result Value Ref Range Status   Specimen Description BLOOD RIGHT HAND  Final   Special Requests   Final    BOTTLES DRAWN AEROBIC AND ANAEROBIC Blood Culture adequate volume   Culture   Final    NO GROWTH 4 DAYS Performed at [facility], [address], [place], [place] [address]    Report Status PENDING  Incomplete  Urine Culture (for pregnant, neutropenic or urologic patients or patients  with an indwelling urinary catheter)     Status: Abnormal   Collection Time: 10/04/23  9:09 AM   Specimen: Urine, Clean Catch  Result Value Ref Range Status   Specimen Description URINE, CLEAN CATCH  Final   Special Requests   Final    NONE Performed at [facility], [address], [place], [place] [address]    Culture 70,000 COLONIES/mL ENTEROCOCCUS FAECALIS (A)  Final   Report Status 10/07/2023 FINAL  Final   Organism ID, Bacteria ENTEROCOCCUS FAECALIS (A)  Final      Susceptibility   Enterococcus faecalis - MIC*    AMPICILLIN <=2 SENSITIVE Sensitive     NITROFURANTOIN <=16 SENSITIVE Sensitive     VANCOMYCIN 2 SENSITIVE Sensitive     * 70,000 COLONIES/mL ENTEROCOCCUS FAECALIS  Aerobic/Anaerobic Culture w Gram Stain (surgical/deep wound)     Status: None (Preliminary result)   Collection Time: 10/04/23  4:10 PM   Specimen: Gallbladder; Bile  Result Value Ref Range Status   Specimen Description GALL BLADDER  Final   Special Requests NONE  Final   Gram Stain   Final    RARE WBC PRESENT, PREDOMINANTLY PMN FEW GRAM NEGATIVE RODS Performed at [facility], [address] [place], [place] [address]    Culture   Final    MODERATE CITROBACTER KOSERI SUSCEPTIBILITIES TO FOLLOW NO ANAEROBES ISOLATED; CULTURE IN PROGRESS FOR 5 DAYS    Report Status PENDING  Incomplete    Antimicrobials: Anti-infectives (From admission, onward)    Start     Dose/Rate Route Frequency Ordered Stop   10/05/23 0000  cefOXitin (MEFOXIN) 2 g in sodium chloride 0.9 % 100 mL IVPB  Status:  Discontinued        2 g 200 mL/hr over 30 Minutes Intravenous To Radiology 10/04/23 1450 10/04/23 1455   10/04/23 1545  cefOXitin (MEFOXIN) 2 g in sodium chloride 0.9 % 100 mL IVPB  Status:  Discontinued        2 g 200 mL/hr over 30 Minutes Intravenous To Radiology 10/04/23 1455 10/04/23 1718   10/03/23 0600  cefTRIAXone (ROCEPHIN) 2 g in sodium chloride 0.9 % 100 mL IVPB       Note to Pharmacy: Start  after blood cultures drawn   2 g 200 mL/hr over 30 Minutes Intravenous Every 24 hours 10/03/23 0514     10/03/23 0600  metroNIDAZOLE (FLAGYL) tablet 500 mg  Status:  Discontinued        500 mg Oral Every 12 hours 10/03/23 0514 10/05/23 0837      Culture/Microbiology Radiology Studies: No results found.   LOS: 17 days   Lanae Boast, MD [facility]  10/07/2023, 11:42 AM

## 2023-10-07 NOTE — Progress Notes (Signed)
[facility] Progress Note   Subjective:   I/Os yest 0.3 / 0.9250.  PO intake minimal.  Afebrile, leukocytosis normalized now. Cr sl improved at 4.2. Wife bedside.  PO intake still poor but overall he's feeling a bit better  Objective Vitals:   10/06/23 2300 10/07/23 0300 10/07/23 0713 10/07/23 0854  BP: (!) 136/98 (!) 144/94 (!) 135/98 (!) 135/96  Pulse: 98 98 100 (!) 105  Resp: 20 20 19    Temp: 98.5 F (36.9 C) 97.8 F (36.6 C) 97.6 F (36.4 C)   TempSrc: Oral Oral Oral   SpO2: 96% 95% 97%   Weight:      Height:       Physical Exam GEN: lying in bed awake and alert looking much more comfortable today ENT: MM tacky  CV: Tachycardic, a fib on monitor PULM: no iwob, bilateral chest rise, dec BS bases, no rales - on 2L [place] ABD: RUQ bilious drain SKIN: Abrasions covered with gauze, otherwise no rashes or jaundice EXT: LLE edema with trauma; brace on LLE, RLE no edema  Additional Objective Labs: Basic Metabolic Panel: Recent Labs  Lab 10/05/23 0512 10/06/23 0416 10/07/23 0344  NA 137 137 140  K 4.1 3.7 3.6  CL 100 100 103  CO2 25 26 25   GLUCOSE 86 87 91  BUN 85* 92* 96*  CREATININE 4.82* 4.69* 4.23*  CALCIUM 8.1* 8.4* 8.4*  PHOS 7.0* 6.1* 6.0*   Liver Function Tests: Recent Labs  Lab 10/03/23 0526 10/04/23 0419 10/05/23 0512 10/06/23 0416 10/07/23 0344  AST 18  --   --   --   --   ALT 19  --   --   --   --   ALKPHOS 42  --   --   --   --   BILITOT 0.9  --   --   --   --   PROT 5.3*  --   --   --   --   ALBUMIN 2.0*   < > 1.7* 1.7* 2.2*   < > = values in this interval not displayed.   No results for input(s): "LIPASE", "AMYLASE" in the last 168 hours. CBC: Recent Labs  Lab 10/03/23 0415 10/04/23 0419 10/05/23 0512 10/06/23 0416 10/07/23 0344  WBC 20.1* 19.7* 15.1* 13.1* 9.2  HGB 8.5* 8.0* 7.8* 7.4* 7.1*  HCT 25.2* 24.9* 24.1* 23.4* 22.8*  MCV 89.7 91.5 92.3 91.4 93.1  PLT 374 402* 394 460* 437*   Blood Culture    Component  Value Date/Time   SDES GALL BLADDER 10/04/2023 1610   SPECREQUEST NONE 10/04/2023 1610   CULT  10/04/2023 1610    MODERATE CITROBACTER KOSERI SUSCEPTIBILITIES TO FOLLOW NO ANAEROBES ISOLATED; CULTURE IN PROGRESS FOR 5 DAYS    REPTSTATUS PENDING 10/04/2023 1610    Cardiac Enzymes: No results for input(s): "CKTOTAL", "CKMB", "CKMBINDEX", "TROPONINI" in the last 168 hours.  CBG: Recent Labs  Lab 10/06/23 1151  GLUCAP 103*   Iron Studies: No results for input(s): "IRON", "TIBC", "TRANSFERRIN", "FERRITIN" in the last 72 hours. @lablastinr3 @ Studies/Results: No results found. Medications:  cefTRIAXone (ROCEPHIN)  IV 2 g (10/07/23 0517)    amiodarone  200 mg Oral BID   brimonidine  1 drop Left Eye Daily   And   timolol  1 drop Left Eye Daily   buPROPion  300 mg Oral Daily   cholecalciferol  1,000 Units Oral Daily   clopidogrel  75 mg Oral Daily   docusate sodium  100  mg Oral Daily   escitalopram  10 mg Oral Daily   feeding supplement  1 Container Oral TID BM   metoprolol succinate  25 mg Oral Daily   midodrine  10 mg Oral TID WC   multivitamin  1 tablet Oral QHS   polyethylene glycol  17 g Oral Daily   senna-docusate  1 tablet Oral QHS   sodium chloride flush  3 mL Intravenous Q12H   sodium chloride flush  5 mL Intracatheter Q8H   cyanocobalamin  100 mcg Oral Daily    Assessment/Plan: **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 -HD catheter removed 11/12 in setting of improving nonoliguric AKI (hadn't been used since 11/5) + fever -kidney function slightly worse this week after acute cholecystitis but slightly improved in past few days after volume challenge -ongoing poor po intake, redose w albumin 25g x 1 dose today -no indications for RRT currently **Acute hypoxemic respiratory failure, on 3L [place] now and stable - by I/Os  net neg 1.9L for admission - 11/13 CXR R small/mod pleural effusion.  If increasing hypoxia would repeat CXR **Permanent atrial fibrillation with RVR, followed by cardiology, on amiodarone.  Apixaban 5 mg BID.  Due to ongoing tachycardia and low BPs cardiology re consulted 11/11 and increased amio - options limited and they've signed off after saying as long as HR </= 120 and BP ok nothing to change **CAD with PCI placed about 2 months ago, per cardiology. Plavix 75 mg daily.   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  **Closed left femoral condyle avulsion fracture, tibial plateau fracture, and brace currently, orthopedics following. **Hypertension: hypotensive in hospital - on midodrine TID now and BPs are much improved. Holding Entresto for AKI. A fib mgmt per above.   **Anemia: Continue under transfuse as needed.  Multifactorial **Acute cholecystitis: febrile 11/11, blood cultures neg, scan c/w acute chole now s/p drain 11/13 radiology.  Surgery following, rec advance diet.  Outpt f/u surgery.  Will continue to follow.      Estill Bakes MD 10/07/2023, 9:07 AM  [facility] Pager: [phone]

## 2023-10-08 DIAGNOSIS — N179 Acute kidney failure, unspecified: Secondary | ICD-10-CM | POA: Diagnosis not present

## 2023-10-08 DIAGNOSIS — N1832 Chronic kidney disease, stage 3b: Secondary | ICD-10-CM | POA: Diagnosis not present

## 2023-10-08 LAB — TYPE AND SCREEN
ABO/RH(D): O POS
Antibody Screen: NEGATIVE
Unit division: 0

## 2023-10-08 LAB — CULTURE, BLOOD (ROUTINE X 2)
Culture: NO GROWTH
Culture: NO GROWTH
Special Requests: ADEQUATE
Special Requests: ADEQUATE

## 2023-10-08 LAB — BASIC METABOLIC PANEL
Anion gap: 14 (ref 5–15)
BUN: 86 mg/dL — ABNORMAL HIGH (ref 8–23)
CO2: 23 mmol/L (ref 22–32)
Calcium: 8.4 mg/dL — ABNORMAL LOW (ref 8.9–10.3)
Chloride: 101 mmol/L (ref 98–111)
Creatinine, Ser: 3.46 mg/dL — ABNORMAL HIGH (ref 0.61–1.24)
GFR, Estimated: 17 mL/min — ABNORMAL LOW (ref >60)
Glucose, Bld: 86 mg/dL (ref 70–99)
Potassium: 4.8 mmol/L (ref 3.5–5.1)
Sodium: 138 mmol/L (ref 135–145)

## 2023-10-08 LAB — HEMOGLOBIN AND HEMATOCRIT, BLOOD
HCT: 26.3 % — ABNORMAL LOW (ref 39.0–52.0)
Hemoglobin: 8.3 g/dL — ABNORMAL LOW (ref 13.0–17.0)

## 2023-10-08 LAB — BPAM RBC
Blood Product Expiration Date: 202412082359
ISSUE DATE / TIME: 202411161059
Unit Type and Rh: 5100

## 2023-10-08 MED ORDER — MIDODRINE HCL 5 MG PO TABS
5.0000 mg | ORAL_TABLET | Freq: Three times a day (TID) | ORAL | Status: DC
  Administered 2023-10-08 – 2023-10-10 (×4): 5 mg via ORAL
  Filled 2023-10-08 (×4): qty 1

## 2023-10-08 MED ORDER — APIXABAN 5 MG PO TABS
5.0000 mg | ORAL_TABLET | Freq: Two times a day (BID) | ORAL | Status: DC
  Administered 2023-10-08 – 2023-10-31 (×48): 5 mg via ORAL
  Filled 2023-10-08 (×48): qty 1

## 2023-10-08 NOTE — Progress Notes (Signed)
PROGRESS NOTE Jorge West  NUU:[id number] DOB: 08-03-1946 DOA: 09/19/2023 PCP: Elizabeth Palau, FNP  Brief Narrative/Hospital Course: 77 year old with history of CHF, A-fib, CAD, HTN, CKD, BPH presenting with left lower extremity trauma Anteroapical and then falling on the floor. Apparently was trapped for about 2 hours until family found him. Upon admission CT head, cervical spine negative, CT chest abdomen pelvis showed acute T12 compression fracture, CT of lower extremity showed asymmetric enlargement of muscle on the left.  Patient was admitted for left femoral condyle avulsion fracture, severe rhabdomyolysis and AKI on CKD. Patient started on dialysis.  Nephrology is following closely.  Patient had fever episode along with right upper quadrant abdominal pain, [place] 11/12-mild wall thickening gallbladder but no gallstone, placed on antibiotics HIDA scan obtained 11/13> was positive for acute cholecystitis-CCS was consulted, given his comorbidities felt to be high risk for surgical intervention IR consulted and cholecystostomy tube placed 11/13.   Subjective: Patient seen and examined Wife at the bedside He appears cheerful this morning but did not have a good night was anxious-needing prn xanax. Oral intake is still poor Afebrile overnight blood pressure has been stable 130s to 140s systolic , heart rate doing well in 90s  Labs showing creatinine downtrending 3.4, hemoglobin up at 8.3 EKG still showing A-fib and prolonged QTc unchanged   Assessment and Plan: Principal Problem:   Acute kidney injury superimposed on stage 3b chronic kidney disease (HCC) Active Problems:   Atrial fibrillation with RVR (HCC)   Essential hypertension   Closed left femoral condyle avulsion fracture (HCC)   Coronary artery disease   Anemia of chronic disease   Malnutrition of moderate degree   Persistent atrial fibrillation (HCC)  Oliguric AKI on CKD 3B  2/2 rhabdomyolysis and contrast nephrotoxicity:   HD started 11/2  W/ Right internal jugular tunneled HD cath 11/1- removed 11/12. Patient received albumin, blood transfusion, encourage oral intake, holding off on IV fluids.  Creatinine finally in 3s as below, improving. Monitor renal function closely Recent Labs    09/29/23 0510 09/30/23 0441 10/01/23 0500 10/02/23 0515 10/03/23 0422 10/04/23 0419 10/05/23 0512 10/06/23 0416 10/07/23 0344 10/08/23 0726  BUN 69* 69* 68* 63* 71* 79* 85* 92* 96* 86*  CREATININE 6.48* 6.34* 5.66* 4.98* 4.69* 4.78* 4.82* 4.69* 4.23* 3.46*  CO2 29 28 28 29 29 28 25 26 25  23  K 3.4* 3.8 3.8 3.5 3.6 4.0 4.1 3.7 3.6 4.8   Intake/Output Summary (Last 24 hours) at 10/08/2023 1020 Last data filed at 10/08/2023 0940 Gross per 24 hour  Intake 721.55 ml  Output 1000 ml  Net -278.45 ml     Traumatic rhabdomyolysis: CK has improved overall continue oral hydration  Acute cholecystitis s/p cholecystostomy tube 11/13-fluid cx Citrobacter: Patient had fever episode along with abdominal pain, leukocytosis> [place] 11/12-mild wall thickening gallbladder- HIDA11/13> positive for acute cholecystitis-CCS was consulted, given his comorbidities felt to be high risk for surgical intervention  s/p IR cholecystostomy tube placed 11/13 ulture with Citrobacter,blood culture 11/2 NGTD.  Continue drain care, antibiotics and further plan as per surgery.  Tolerating diet but w/ poor oral intake. Continue nutritional supplement, encouraged oral intake. Recent Labs  Lab 10/03/23 0415 10/03/23 0526 10/04/23 0419 10/05/23 0512 10/06/23 0416 10/07/23 0344  WBC 20.1*  --  19.7* 15.1* 13.1* 9.2  LATICACIDVEN  --  0.9  --   --   --   --    Pyuria-Enterococcus faecalis on culture: Patient having urinary symptoms , cont Unasyn  Hypokalemia: Resolved  Permanent A Fib with RVR Prolonged QTc: BP 1 soft/hypotensive limiting titration of Toprol-XL. Bp stable now-can increase to Toprol if needed.  Continue amiodarone.  Eliquis was  held.  Procedure- we will resume now as HB up >8 gm, no evidence of active bleeding- pt and wife agreeable. Monitor electrolytes and QTc  CAD w/ PCI placed 2 months ago: No chest pain. Cont home Plavix.  Essential hypertension: Hypotension resolved, BP stable. He is on midodrine 5 mg tid ( may need to  wean down), Holding Entresto   Scrotal edema Elevate.   Acute hypoxic respiratory failure: Multifactorial in the setting of CHF, and acute cholecystitis and abdominal pain, continue supplement oxygen, encourage I-S PT OT, ambulate Oob, IS.   Acute on chronic diastolic CHF: TTE- LVEF:60- to 65%, mild LVH, RV with normal systolic function,no significant valvular disease. Cont metoprolol. Wean off ivf as able to eat.Avoiding ARB/ARNI due to AKI.  Net IO Since Admission: 4,106.04 mL [10/08/23 1020]   Closed left femoral condyle avulsion fracture/ T12 compression fracture: Continue PT OT and pain control.  Seen by orthopedics WBAT, Brace and follow-up outpatient with Dr. Zoe Lan need reconstructive surgery down the road but no acute surgical recommendation advised this admission.   Anemia of chronic disease ABLA: Hemoglobin fluctuating, remains high risk for ABLA-given his Eliquis and plavx-needed urgent procedure w/ IR cholecystostomy tube having serosanguineous drainage.  S/p 1 unit PRBC 11/16- hb stable Recent Labs  Lab 10/05/23 0512 10/06/23 0416 10/07/23 0344 10/07/23 2212 10/08/23 0401  HGB 7.8* 7.4* 7.1* 8.0* 8.3*  HCT 24.1* 23.4* 22.8* 24.9* 26.3*   Malnutrition of moderate degree Continue nutritional supplements Nutrition Problem: Moderate Malnutrition Etiology: chronic illness (heart failure, afib) Signs/Symptoms: mild fat depletion, mild muscle depletion Interventions: Ensure Enlive (each supplement provides 350kcal and 20 grams of protein), MVI, Magic cup, Education  .  Obesity:Patient's Body mass index is 29.42 kg/m. : Will benefit with PCP follow-up, weight loss   healthy lifestyle and outpatient sleep evaluation.  DVT prophylaxis: SCDs Start: 09/20/23 0018 Place TED hose Start: 09/20/23 0018 eliquis Code Status:   Code Status: Full Code Family Communication: plan of care discussed with patient/wife updated at bedside  in details Patient status is: Inpatient because of AKI Level of care: Telemetry Cardiac   Dispo: The patient is from: home w/ wife            Anticipated disposition: CIR ~ 2 days  Objective: Vitals last 24 hrs: Vitals:   10/07/23 2330 10/08/23 0300 10/08/23 0500 10/08/23 0935  BP: (!) 137/91 (!) 149/93  130/81  Pulse: 96 93  95  Resp:      Temp: 98.6 F (37 C) 97.8 F (36.6 C)    TempSrc: Oral Oral    SpO2: 93% 96%    Weight:   93 kg   Height:       Weight change:   Physical Examination: General exam: alert awake, oriented, but with generalized weakness, frail appearing HEENT:Oral mucosa moist, Ear/Nose WNL grossly Respiratory system: Bilaterally clear BS,no use of accessory muscle Cardiovascular system: S1 & S2 +, No JVD. Gastrointestinal system: Abdomen soft, RUQ DRAIN WITH biliary output, mild tenderness ,ND, BS+ Nervous System: Alert, awake, moving all extremities,and following commands. Extremities: LE edema neg,distal peripheral pulses palpable and warm.  Skin: No rashes,no icterus. MSK: Normal muscle bulk,tone, power   Medications reviewed:  Scheduled Meds:  amiodarone  200 mg Oral BID   apixaban  5 mg Oral BID  brimonidine  1 drop Left Eye Daily   And   timolol  1 drop Left Eye Daily   buPROPion  300 mg Oral Daily   cholecalciferol  1,000 Units Oral Daily   clopidogrel  75 mg Oral Daily   docusate sodium  100 mg Oral Daily   escitalopram  10 mg Oral Daily   feeding supplement  1 Container Oral TID BM   metoprolol succinate  25 mg Oral Daily   midodrine  5 mg Oral TID WC   multivitamin  1 tablet Oral QHS   polyethylene glycol  17 g Oral Daily   senna-docusate  1 tablet Oral QHS   sodium chloride  flush  3 mL Intravenous Q12H   sodium chloride flush  5 mL Intracatheter Q8H   cyanocobalamin  100 mcg Oral Daily  Continuous Infusions:  ampicillin-sulbactam (UNASYN) IV 3 g (10/07/23 2213)    Diet Order             DIET DYS 3 Room service appropriate? Yes; Fluid consistency: Thin  Diet effective now                   Intake/Output Summary (Last 24 hours) at 10/08/2023 1020 Last data filed at 10/08/2023 0940 Gross per 24 hour  Intake 721.55 ml  Output 1000 ml  Net -278.45 ml   Net IO Since Admission: 4,106.04 mL [10/08/23 1020]  Wt Readings from Last 3 Encounters:  10/08/23 93 kg  09/05/23 84.4 kg  01/31/21 89.4 kg     Unresulted Labs (From admission, onward)     Start     Ordered   10/09/23 0500  Basic metabolic panel  Daily,   R     Question:  Specimen collection method  Answer:  Lab=Lab collect   10/08/23 0634   10/09/23 0500  CBC  Daily,   R     Question:  Specimen collection method  Answer:  Lab=Lab collect   10/08/23 0634   09/19/23 2017  Prepare fresh frozen plasma  (**Emergency Blood Administration**)  ONCE - STAT,   STAT       Comments: Emergent release   Question Answer Comment  # of Units 2 units   Special Requirements Other (Specify)   Emergent release call blood bank [facility] [phone]   Attestation: Emergent transfusion, the ordering licensed practitioner has determined that the risk of delaying transfusion outweighs the risk of transfusing incompatible or incompletely tested units.     Placed in "And" Linked Group   09/19/23 2016          Data Reviewed: I have personally reviewed following labs and imaging studies CBC: Recent Labs  Lab 10/03/23 0415 10/04/23 0419 10/05/23 0512 10/06/23 0416 10/07/23 0344 10/07/23 2212 10/08/23 0401  WBC 20.1* 19.7* 15.1* 13.1* 9.2  --   --   HGB 8.5* 8.0* 7.8* 7.4* 7.1* 8.0* 8.3*  HCT 25.2* 24.9* 24.1* 23.4* 22.8* 24.9* 26.3*  MCV 89.7 91.5 92.3 91.4 93.1  --   --   PLT 374 402* 394 460* 437*   --   --    Basic Metabolic Panel: Recent Labs  Lab 10/02/23 0515 10/03/23 0415 10/03/23 0422 10/04/23 0419 10/05/23 0512 10/06/23 0416 10/07/23 0344 10/08/23 0726  NA 134*  --  135 136 137 137 140 138  K 3.5  --  3.6 4.0 4.1 3.7 3.6 4.8  CL 96*  --  95* 96* 100 100 103 101  CO2 29  --  29 28 25 26 25 23   GLUCOSE 115*  --  111* 100* 86 87 91 86  BUN 63*  --  71* 79* 85* 92* 96* 86*  CREATININE 4.98*  --  4.69* 4.78* 4.82* 4.69* 4.23* 3.46*  CALCIUM 8.5*  --  8.6* 8.5* 8.1* 8.4* 8.4* 8.4*  MG 1.9 1.8  --  1.9  --   --   --   --   PHOS 5.4*  --  5.0* 6.0* 7.0* 6.1* 6.0*  --    GFR: Estimated Creatinine Clearance: 20.5 mL/min (A) (by C-G formula based on SCr of 3.46 mg/dL (H)). Liver Function Tests: Recent Labs  Lab 10/03/23 0526 10/04/23 0419 10/05/23 0512 10/06/23 0416 10/07/23 0344  AST 18  --   --   --   --   ALT 19  --   --   --   --   ALKPHOS 42  --   --   --   --   BILITOT 0.9  --   --   --   --   PROT 5.3*  --   --   --   --   ALBUMIN 2.0* 1.8* 1.7* 1.7* 2.2*   Recent Results (from the past 240 hour(s))  Resp panel by RT-PCR (RSV, Flu A&B, Covid) Anterior Nasal Swab     Status: None   Collection Time: 10/03/23  5:18 AM   Specimen: Anterior Nasal Swab  Result Value Ref Range Status   SARS Coronavirus 2 by RT PCR NEGATIVE NEGATIVE Final   Influenza A by PCR NEGATIVE NEGATIVE Final   Influenza B by PCR NEGATIVE NEGATIVE Final    Comment: (NOTE) The Xpert Xpress SARS-CoV-2/FLU/RSV plus assay is intended as an aid in the diagnosis of influenza from Nasopharyngeal swab specimens and should not be used as a sole basis for treatment. Nasal washings and aspirates are unacceptable for Xpert Xpress SARS-CoV-2/FLU/RSV testing.  Fact Sheet for Patients: BloggerCourse.com  Fact Sheet for Healthcare Providers: SeriousBroker.it  This test is not yet approved or cleared by the [place] FDA and has been authorized  for detection and/or diagnosis of SARS-CoV-2 by FDA under an Emergency Use Authorization (EUA). This EUA will remain in effect (meaning this test can be used) for the duration of the COVID-19 declaration under Section 564(b)(1) of the Act, 21 U.S.C. section 360bbb-3(b)(1), unless the authorization is terminated or revoked.     Resp Syncytial Virus by PCR NEGATIVE NEGATIVE Final    Comment: (NOTE) Fact Sheet for Patients: BloggerCourse.com  Fact Sheet for Healthcare Providers: SeriousBroker.it  This test is not yet approved or cleared by the [place] FDA and has been authorized for detection and/or diagnosis of SARS-CoV-2 by FDA under an Emergency Use Authorization (EUA). This EUA will remain in effect (meaning this test can be used) for the duration of the COVID-19 declaration under Section 564(b)(1) of the Act, 21 U.S.C. section 360bbb-3(b)(1), unless the authorization is terminated or revoked.  Performed at [facility], [address] [place], [place] [address]   Culture, blood (Routine X 2) w Reflex to ID Panel     Status: None   Collection Time: 10/03/23  5:26 AM   Specimen: BLOOD LEFT HAND  Result Value Ref Range Status   Specimen Description BLOOD LEFT HAND  Final   Special Requests   Final    BOTTLES DRAWN AEROBIC AND ANAEROBIC Blood Culture adequate volume   Culture   Final    NO GROWTH 5  DAYS Performed at [facility], [address], [place], [place] [address]    Report Status 10/08/2023 FINAL  Final  Culture, blood (Routine X 2) w Reflex to ID Panel     Status: None   Collection Time: 10/03/23  5:29 AM   Specimen: BLOOD RIGHT HAND  Result Value Ref Range Status   Specimen Description BLOOD RIGHT HAND  Final   Special Requests   Final    BOTTLES DRAWN AEROBIC AND ANAEROBIC Blood Culture adequate volume   Culture   Final    NO GROWTH 5 DAYS Performed at [facility], [address] [place], [place] [address]    Report Status 10/08/2023 FINAL  Final  Urine Culture (for pregnant, neutropenic or urologic patients or patients with an indwelling urinary catheter)     Status: Abnormal   Collection Time: 10/04/23  9:09 AM   Specimen: Urine, Clean Catch  Result Value Ref Range Status   Specimen Description URINE, CLEAN CATCH  Final   Special Requests   Final    NONE Performed at [facility], [address] [place], [place] [address]    Culture 70,000 COLONIES/mL ENTEROCOCCUS FAECALIS (A)  Final   Report Status 10/07/2023 FINAL  Final   Organism ID, Bacteria ENTEROCOCCUS FAECALIS (A)  Final      Susceptibility   Enterococcus faecalis - MIC*    AMPICILLIN <=2 SENSITIVE Sensitive     NITROFURANTOIN <=16 SENSITIVE Sensitive     VANCOMYCIN 2 SENSITIVE Sensitive     * 70,000 COLONIES/mL ENTEROCOCCUS FAECALIS  Aerobic/Anaerobic Culture w Gram Stain (surgical/deep wound)     Status: None (Preliminary result)   Collection Time: 10/04/23  4:10 PM   Specimen: Gallbladder; Bile  Result Value Ref Range Status   Specimen Description GALL BLADDER  Final   Special Requests NONE  Final   Gram Stain   Final    RARE WBC PRESENT, PREDOMINANTLY PMN FEW GRAM NEGATIVE RODS Performed at [facility], [address] [place], [place] [address]    Culture   Final    MODERATE CITROBACTER KOSERI NO ANAEROBES ISOLATED; CULTURE IN PROGRESS FOR 5 DAYS    Report Status PENDING  Incomplete   Organism ID, Bacteria CITROBACTER KOSERI  Final      Susceptibility   Citrobacter koseri - MIC*    CEFEPIME <=0.12 SENSITIVE Sensitive     CEFTAZIDIME <=1 SENSITIVE Sensitive     CEFTRIAXONE <=0.25 SENSITIVE Sensitive     CIPROFLOXACIN <=0.25 SENSITIVE Sensitive     GENTAMICIN <=1 SENSITIVE Sensitive     IMIPENEM <=0.25 SENSITIVE Sensitive     TRIMETH/SULFA <=20 SENSITIVE Sensitive     PIP/TAZO <=4 SENSITIVE Sensitive ug/mL    * MODERATE CITROBACTER KOSERI     Antimicrobials: Anti-infectives (From admission, onward)    Start     Dose/Rate Route Frequency Ordered Stop   10/07/23 2200  Ampicillin-Sulbactam (UNASYN) 3 g in sodium chloride 0.9 % 100 mL IVPB        3 g 200 mL/hr over 30 Minutes Intravenous Every 12 hours 10/07/23 1215     10/05/23 0000  cefOXitin (MEFOXIN) 2 g in sodium chloride 0.9 % 100 mL IVPB  Status:  Discontinued        2 g 200 mL/hr over 30 Minutes Intravenous To Radiology 10/04/23 1450 10/04/23 1455   10/04/23 1545  cefOXitin (MEFOXIN) 2 g in sodium chloride 0.9 % 100 mL IVPB  Status:  Discontinued  2 g 200 mL/hr over 30 Minutes Intravenous To Radiology 10/04/23 1455 10/04/23 1718   10/03/23 0600  cefTRIAXone (ROCEPHIN) 2 g in sodium chloride 0.9 % 100 mL IVPB  Status:  Discontinued       Note to Pharmacy: Start after blood cultures drawn   2 g 200 mL/hr over 30 Minutes Intravenous Every 24 hours 10/03/23 0514 10/07/23 1215   10/03/23 0600  metroNIDAZOLE (FLAGYL) tablet 500 mg  Status:  Discontinued        500 mg Oral Every 12 hours 10/03/23 0514 10/05/23 0837      Culture/Microbiology Radiology Studies: No results found.   LOS: 18 days   Lanae Boast, MD [facility]  10/08/2023, 10:20 AM

## 2023-10-08 NOTE — Progress Notes (Addendum)
[facility] Progress Note   Subjective:   I/Os yest 0.7 / 0.9.   Cr further improved at 3.5. Wife bedside.  PO intake still poor but overall he's feeling a bit better  Objective Vitals:   10/07/23 2330 10/08/23 0300 10/08/23 0500 10/08/23 0935  BP: (!) 137/91 (!) 149/93  130/81  Pulse: 96 93  95  Resp:      Temp: 98.6 F (37 C) 97.8 F (36.6 C)    TempSrc: Oral Oral    SpO2: 93% 96%    Weight:   93 kg   Height:       Physical Exam GEN: lying in bed awake and alert looking much more comfortable today ENT: MM tacky  CV: Tachycardic, a fib on monitor PULM: no iwob, bilateral chest rise, dec BS bases, no rales - on 2L [place]  ABD: RUQ bilious drain SKIN: Abrasions covered with gauze, otherwise no rashes or jaundice EXT: LLE edema with trauma; brace on LLE, RLE no edema  Additional Objective Labs: Basic Metabolic Panel: Recent Labs  Lab 10/05/23 0512 10/06/23 0416 10/07/23 0344 10/08/23 0726  NA 137 137 140 138  K 4.1 3.7 3.6 4.8  CL 100 100 103 101  CO2 25 26 25 23   GLUCOSE 86 87 91 86  BUN 85* 92* 96* 86*  CREATININE 4.82* 4.69* 4.23* 3.46*  CALCIUM 8.1* 8.4* 8.4* 8.4*  PHOS 7.0* 6.1* 6.0*  --    Liver Function Tests: Recent Labs  Lab 10/03/23 0526 10/04/23 0419 10/05/23 0512 10/06/23 0416 10/07/23 0344  AST 18  --   --   --   --   ALT 19  --   --   --   --   ALKPHOS 42  --   --   --   --   BILITOT 0.9  --   --   --   --   PROT 5.3*  --   --   --   --   ALBUMIN 2.0*   < > 1.7* 1.7* 2.2*   < > = values in this interval not displayed.   No results for input(s): "LIPASE", "AMYLASE" in the last 168 hours. CBC: Recent Labs  Lab 10/03/23 0415 10/04/23 0419 10/05/23 0512 10/06/23 0416 10/07/23 0344 10/07/23 2212 10/08/23 0401  WBC 20.1* 19.7* 15.1* 13.1* 9.2  --   --   HGB 8.5* 8.0* 7.8* 7.4* 7.1* 8.0* 8.3*  HCT 25.2* 24.9* 24.1* 23.4* 22.8* 24.9* 26.3*  MCV 89.7 91.5 92.3 91.4 93.1  --   --   PLT 374 402* 394 460* 437*  --   --     Blood Culture    Component Value Date/Time   SDES GALL BLADDER 10/04/2023 1610   SPECREQUEST NONE 10/04/2023 1610   CULT  10/04/2023 1610    MODERATE CITROBACTER KOSERI NO ANAEROBES ISOLATED; CULTURE IN PROGRESS FOR 5 DAYS    REPTSTATUS PENDING 10/04/2023 1610    Cardiac Enzymes: No results for input(s): "CKTOTAL", "CKMB", "CKMBINDEX", "TROPONINI" in the last 168 hours.  CBG: Recent Labs  Lab 10/06/23 1151  GLUCAP 103*   Iron Studies: No results for input(s): "IRON", "TIBC", "TRANSFERRIN", "FERRITIN" in the last 72 hours. @lablastinr3 @ Studies/Results: No results found. Medications:  ampicillin-sulbactam (UNASYN) IV 3 g (10/07/23 2213)    amiodarone  200 mg Oral BID   apixaban  5 mg Oral BID   brimonidine  1 drop Left Eye Daily   And   timolol  1 drop Left  Eye Daily   buPROPion  300 mg Oral Daily   cholecalciferol  1,000 Units Oral Daily   clopidogrel  75 mg Oral Daily   docusate sodium  100 mg Oral Daily   escitalopram  10 mg Oral Daily   feeding supplement  1 Container Oral TID BM   metoprolol succinate  25 mg Oral Daily   midodrine  10 mg Oral TID WC   multivitamin  1 tablet Oral QHS   polyethylene glycol  17 g Oral Daily   senna-docusate  1 tablet Oral QHS   sodium chloride flush  3 mL Intravenous Q12H   sodium chloride flush  5 mL Intracatheter Q8H   cyanocobalamin  100 mcg Oral Daily    Assessment/Plan: **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 -HD catheter removed 11/12 in setting of improving nonoliguric AKI (hadn't been used since 11/5) + fever (bld cx ultimately neg, issue was cholecystitis) -Cr improving in past few days, nonoliguric; has been receiving IV albumin support past 72h -po intake still not great but encouraged, will hold albumin today to ensure he can support intake adequately for SNF  placement this week -cont daily labs -no indications for RRT currently **Acute hypoxemic respiratory failure, on 2L [place] now and stable - by I/Os net neg 1.4L for admission - 11/13 CXR R small/mod pleural effusion.  If increasing hypoxia would repeat CXR **Permanent atrial fibrillation with RVR, followed by cardiology, on amiodarone.  Apixaban 5 mg BID.  Due to ongoing tachycardia and low BPs cardiology re consulted 11/11 and increased amio - options limited and they've signed off after saying as long as HR </= 120 and BP ok nothing to change **CAD with PCI placed about 2 months ago, per cardiology. Plavix 75 mg daily.   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  **Closed left femoral condyle avulsion fracture, tibial plateau fracture, and brace currently, orthopedics following. **Hypertension: hypotensive in hospital - on midodrine TID now and BPs are much improved > wean midodrine. Holding Entresto for AKI. A fib mgmt per above.   **Anemia: Continue under transfuse as needed.  Multifactorial **Acute cholecystitis: febrile 11/11, blood cultures neg, scan c/w acute chole now s/p drain 11/13 radiology.  Surgery following, rec advance diet.  Outpt f/u surgery.  Will continue to follow.      Estill Bakes MD 10/08/2023, 9:57 AM  [facility] Pager: [phone]

## 2023-10-09 ENCOUNTER — Encounter (HOSPITAL_COMMUNITY): Payer: Medicare PPO

## 2023-10-09 DIAGNOSIS — N1832 Chronic kidney disease, stage 3b: Secondary | ICD-10-CM | POA: Diagnosis not present

## 2023-10-09 DIAGNOSIS — N179 Acute kidney failure, unspecified: Secondary | ICD-10-CM | POA: Diagnosis not present

## 2023-10-09 LAB — CBC
HCT: 26.7 % — ABNORMAL LOW (ref 39.0–52.0)
Hemoglobin: 8.4 g/dL — ABNORMAL LOW (ref 13.0–17.0)
MCH: 29 pg (ref 26.0–34.0)
MCHC: 31.5 g/dL (ref 30.0–36.0)
MCV: 92.1 fL (ref 80.0–100.0)
Platelets: 440 10*3/uL — ABNORMAL HIGH (ref 150–400)
RBC: 2.9 MIL/uL — ABNORMAL LOW (ref 4.22–5.81)
RDW: 14.6 % (ref 11.5–15.5)
WBC: 8.4 10*3/uL (ref 4.0–10.5)
nRBC: 0 % (ref 0.0–0.2)

## 2023-10-09 LAB — BASIC METABOLIC PANEL
Anion gap: 10 (ref 5–15)
BUN: 69 mg/dL — ABNORMAL HIGH (ref 8–23)
CO2: 27 mmol/L (ref 22–32)
Calcium: 8.6 mg/dL — ABNORMAL LOW (ref 8.9–10.3)
Chloride: 105 mmol/L (ref 98–111)
Creatinine, Ser: 3.12 mg/dL — ABNORMAL HIGH (ref 0.61–1.24)
GFR, Estimated: 20 mL/min — ABNORMAL LOW (ref >60)
Glucose, Bld: 94 mg/dL (ref 70–99)
Potassium: 3.5 mmol/L (ref 3.5–5.1)
Sodium: 142 mmol/L (ref 135–145)

## 2023-10-09 LAB — AEROBIC/ANAEROBIC CULTURE W GRAM STAIN (SURGICAL/DEEP WOUND)

## 2023-10-09 NOTE — Progress Notes (Signed)
Inpatient Rehab Admissions Coordinator:    Case discussed with therapy. OT is changing recommendations as they do not feel he's able to tolerate the intensity of CIR and would benefit from lower intensity rehab at SNF. I am in agreement. CIR will sign off. I will reach out to [facility].  Megan Salon, MS, CCC-SLP Rehab Admissions Coordinator  [phone] (celll) [phone] (office)
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
## 2023-10-09 NOTE — Plan of Care (Signed)
  Problem: Activity: Goal: Risk for activity intolerance will decrease Outcome: Not Progressing   
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
## 2023-10-09 NOTE — Progress Notes (Signed)
Nutrition Brief Note  Calorie count order in place to start today 11/18.  Reviewed chart. No updated documentation of meal completions on file to review since  11/12. Unfortunately, pt with OT at time of visit. Per MD note, pt continues with poor oral intake; continues to improve slowly but remains very deconditioned and frail. No plans for Cortrak placement at this time and continue to augment via nutrition supplements. Noted plans for SNF placement in about 2 days.    Will not continue with calorie count and plan to continue current nutrition interventions. Pt would likely still benefit from supplemental nutrition support if continues to remain inpatient to support healing and increased nutrition needs.   RD will continue to follow up as appropriate. Please reach out via secure chat or pager if nutrition needs should arise in the meantime.   Jorge West, RDN, LDN Clinical Nutrition

## 2023-10-10 DIAGNOSIS — N1832 Chronic kidney disease, stage 3b: Secondary | ICD-10-CM | POA: Diagnosis not present

## 2023-10-10 DIAGNOSIS — N179 Acute kidney failure, unspecified: Secondary | ICD-10-CM | POA: Diagnosis not present

## 2023-10-10 LAB — CBC
HCT: 26.7 % — ABNORMAL LOW (ref 39.0–52.0)
Hemoglobin: 8.5 g/dL — ABNORMAL LOW (ref 13.0–17.0)
MCH: 29.1 pg (ref 26.0–34.0)
MCHC: 31.8 g/dL (ref 30.0–36.0)
MCV: 91.4 fL (ref 80.0–100.0)
Platelets: 389 10*3/uL (ref 150–400)
RBC: 2.92 MIL/uL — ABNORMAL LOW (ref 4.22–5.81)
RDW: 14.5 % (ref 11.5–15.5)
WBC: 8.7 10*3/uL (ref 4.0–10.5)
nRBC: 0 % (ref 0.0–0.2)

## 2023-10-10 LAB — BASIC METABOLIC PANEL
Anion gap: 10 (ref 5–15)
BUN: 62 mg/dL — ABNORMAL HIGH (ref 8–23)
CO2: 27 mmol/L (ref 22–32)
Calcium: 8.7 mg/dL — ABNORMAL LOW (ref 8.9–10.3)
Chloride: 104 mmol/L (ref 98–111)
Creatinine, Ser: 2.59 mg/dL — ABNORMAL HIGH (ref 0.61–1.24)
GFR, Estimated: 25 mL/min — ABNORMAL LOW (ref >60)
Glucose, Bld: 116 mg/dL — ABNORMAL HIGH (ref 70–99)
Potassium: 3.2 mmol/L — ABNORMAL LOW (ref 3.5–5.1)
Sodium: 141 mmol/L (ref 135–145)

## 2023-10-10 MED ORDER — POTASSIUM CHLORIDE CRYS ER 20 MEQ PO TBCR
40.0000 meq | EXTENDED_RELEASE_TABLET | ORAL | Status: AC
Start: 2023-10-10 — End: 2023-10-10
  Administered 2023-10-10 (×2): 40 meq via ORAL
  Filled 2023-10-10 (×2): qty 2
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
## 2023-10-10 NOTE — Plan of Care (Signed)
Patient ID: TONEY TOW, male   DOB: [birth year], 77 y.o.   MRN: [id number]  Problem: Education: Goal: Knowledge of General Education information will improve Description: Including pain rating scale, medication(s)/side effects and non-pharmacologic comfort measures Outcome: Progressing   Problem: Health Behavior/Discharge Planning: Goal: Ability to manage health-related needs will improve Outcome: Progressing   Problem: Clinical Measurements: Goal: Ability to maintain clinical measurements within normal limits will improve Outcome: Progressing Goal: Will remain free from infection Outcome: Progressing Goal: Diagnostic test results will improve Outcome: Progressing Goal: Respiratory complications will improve Outcome: Progressing Goal: Cardiovascular complication will be avoided Outcome: Progressing   Problem: Activity: Goal: Risk for activity intolerance will decrease Outcome: Progressing   Problem: Nutrition: Goal: Adequate nutrition will be maintained Outcome: Progressing   Problem: Coping: Goal: Level of anxiety will decrease Outcome: Progressing   Problem: Elimination: Goal: Will not experience complications related to bowel motility Outcome: Progressing Goal: Will not experience complications related to urinary retention Outcome: Progressing   Problem: Pain Management: Goal: General experience of comfort will improve Outcome: Progressing   Problem: Safety: Goal: Ability to remain free from injury will improve Outcome: Progressing   Problem: Skin Integrity: Goal: Risk for impaired skin integrity will decrease Outcome: Progressing    Lidia Collum, RN

## 2023-10-10 NOTE — Progress Notes (Signed)
PROGRESS NOTE Jorge West  YTK:[id number] DOB: [birth year] DOA: 09/19/2023 PCP: Elizabeth Palau, FNP  Brief Narrative/Hospital Course: 77 year old with history of CHF, A-fib, CAD, HTN, CKD, BPH presenting with left lower extremity trauma Anteroapical and then falling on the floor. Apparently was trapped for about 2 hours until family found him. Upon admission CT head, cervical spine negative, CT chest abdomen pelvis showed acute T12 compression fracture, CT of lower extremity showed asymmetric enlargement of muscle on the left.  Patient was admitted for left femoral condyle avulsion fracture, severe rhabdomyolysis and AKI on CKD. Patient started on dialysis.  Nephrology is following closely.  Patient had fever episode along with right upper quadrant abdominal pain, [place] 11/12-mild wall thickening gallbladder but no gallstone, placed on antibiotics HIDA scan obtained 11/13> was positive for acute cholecystitis-CCS was consulted, given his comorbidities felt to be high risk for surgical intervention IR consulted and cholecystostomy tube placed 11/13.  Overall or clinically he is afebrile WBC count has normalized, hemoglobin drifted PRN 1 unit PRBC. He remains weak and deconditioned.   Subjective: Patient seen and examined this morning Wife at the bedside He appears alert awake but very weak and deconditioned -Overnight afebrile heart rate THIS AM IN 111  Labs with further improving creatinine mild hypokalemia hemoglobin stable up to 8.5   Assessment and Plan: Principal Problem:   Acute kidney injury superimposed on stage 3b chronic kidney disease (HCC) Active Problems:   Atrial fibrillation with RVR (HCC)   Essential hypertension   Closed left femoral condyle avulsion fracture (HCC)   Coronary artery disease   Anemia of chronic disease   Malnutrition of moderate degree   Persistent atrial fibrillation (HCC)  Oliguric AKI on CKD 3B  2/2 rhabdomyolysis and contrast nephrotoxicity:   Patient needed HD on 11/2-subsequently discontinued and HD cath removed 11/12.  Renal function improving, nephro signed off.  Continues to have marginal oral intake, encourage  PO. Midodrine dose was decreased to 2.5 11/18, monitor renal function avoid nephrotoxic medication Recent Labs    10/01/23 0500 10/02/23 0515 10/03/23 0422 10/04/23 0419 10/05/23 0512 10/06/23 0416 10/07/23 0344 10/08/23 0726 10/09/23 0539 10/10/23 0541  BUN 68* 63* 71* 79* 85* 92* 96* 86* 69* 62*  CREATININE 5.66* 4.98* 4.69* 4.78* 4.82* 4.69* 4.23* 3.46* 3.12* 2.59*  CO2 28 29 29 28 25 26 25 23 27  27  K 3.8 3.5 3.6 4.0 4.1 3.7 3.6 4.8 3.5 3.2*    Traumatic rhabdomyolysis: CK has improved overall continue oral hydration  Acute cholecystitis s/p cholecystostomy tube 11/13-fluid cx Citrobacter: Patient had fever episode along with abdominal pain, leukocytosis> [place] 11/12-mild wall thickening gallbladder- HIDA11/13> positive for acute cholecystitis-CCS was consulted, given his comorbidities felt to be high risk for surgical intervention  s/p IR cholecystostomy tube placed 11/13 ulture with Citrobacter,blood culture 11/2 NGTD.  Continue Unasyn treatment duration 10 days post drain placement-per iD. Pharmacy.  Encourage p.o. Recent Labs  Lab 10/05/23 0512 10/06/23 0416 10/07/23 0344 10/09/23 0539 10/10/23 0541  WBC 15.1* 13.1* 9.2 8.4 8.7   Pyuria-Enterococcus faecalis on culture-pansensitive: On antibiotics as above  Hypokalemia: Replacing again.  Permanent A Fib with RVR Prolonged QTc: BP initially soft/hypotensive limiting titration of Toprol-XL, was placed on midodrine now BP stable.  Midodrine dose decreased 11/18.  Continue Toprol and amiodarone and adjust Toprol if needed   Essential hypertension: Hypotension resolved, BP stable. He is on midodrine  see above. Continue holding Entresto   CAD w/ PCI placed 2 months ago: No chest pain. Cont  home Plavix.  Scrotal edema Elevate.   Acute  hypoxic respiratory failure: Multifactorial in the setting of CHF, and acute cholecystitis and abdominal pain, continue supplement oxygen, encourage I-S PT OT, ambulate Oob, IS.   Acute on chronic diastolic CHF: TTE- LVEF:60- to 65%, mild LVH, RV with normal systolic function,no significant valvular disease. Cont metoprolol.  Holding Entresto for now due to AKI. Net IO Since Admission: 1,819.04 mL [10/10/23 1254]   Closed left femoral condyle avulsion fracture/ T12 compression fracture: Seen by orthopedics WBAT, Brace and follow-up outpatient with Dr. Zoe Lan need reconstructive surgery down the road but no acute surgical recommendation advised this admission.Continue PT OT and pain control.     Anemia of chronic disease ABLA: Hemoglobin fluctuating, remains high risk for ABLA-given his Eliquis and plavx-needed urgent procedure w/ IR cholecystostomy tube having serosanguineous drainage.  S/p 1 unit PRBC 11/16- hb stable >8 gm, and improving monitor daily Recent Labs  Lab 10/07/23 0344 10/07/23 2212 10/08/23 0401 10/09/23 0539 10/10/23 0541  HGB 7.1* 8.0* 8.3* 8.4* 8.5*  HCT 22.8* 24.9* 26.3* 26.7* 26.7*   Malnutrition of moderate degree Continue nutritional supplements and augment as below Nutrition Problem: Moderate Malnutrition Etiology: chronic illness (heart failure, afib) Signs/Symptoms: mild fat depletion, mild muscle depletion Interventions: Ensure Enlive (each supplement provides 350kcal and 20 grams of protein), MVI, Magic cup, Education  .  Obesity:Patient's Body mass index is 31 kg/m. : Will benefit with PCP follow-up, weight loss  healthy lifestyle and outpatient sleep evaluation.  DVT prophylaxis: SCDs Start: 09/20/23 0018 Place TED hose Start: 09/20/23 0018 eliquis Code Status:   Code Status: Full Code Family Communication: plan of care discussed with patient/wife again updated at the bedside. Patient status is: Inpatient because of AKI Level of care: Telemetry  Cardiac   Dispo: The patient is from: home w/ wife            Anticipated disposition: CIR will likely be high intensity for him will benefit with a skilled nursing facility likely Thursday if SNF available  Objective: Vitals last 24 hrs: Vitals:   10/09/23 2000 10/09/23 2309 10/10/23 0335 10/10/23 0749  BP: (!) 159/90 134/84 (!) 142/100 (!) 149/90  Pulse: (!) 105 89 (!) 108 (!) 111  Resp: 18 17 18 20   Temp: 97.7 F (36.5 C) 97.8 F (36.6 C) 97.9 F (36.6 C) 98.1 F (36.7 C)  TempSrc: Oral Oral Oral Oral  SpO2: 100% 97% 97% 99%  Weight:   98 kg   Height:       Weight change: 2 kg  Physical Examination: General exam: alert awake, weak and frail HEENT:Oral mucosa moist, Ear/Nose WNL grossly Respiratory system: Bilaterally clear BS,no use of accessory muscle Cardiovascular system: S1 & S2 +, No JVD. Gastrointestinal system: Abdomen soft,NT,ND, BS+ Nervous System: Alert, awake, moving all extremities,and following commands. Extremities: Lt leg in brace,  no edema,distal peripheral pulses palpable and warm.  Skin: No rashes,no icterus. MSK: Normal muscle bulk,tone, power   Medications reviewed:  Scheduled Meds:  apixaban  5 mg Oral BID   brimonidine  1 drop Left Eye Daily   And   timolol  1 drop Left Eye Daily   buPROPion  300 mg Oral Daily   cholecalciferol  1,000 Units Oral Daily   clopidogrel  75 mg Oral Daily   docusate sodium  100 mg Oral Daily   escitalopram  10 mg Oral Daily   feeding supplement  1 Container Oral TID BM   metoprolol succinate  25  mg Oral Daily   midodrine  5 mg Oral TID WC   multivitamin  1 tablet Oral QHS   polyethylene glycol  17 g Oral Daily   potassium chloride  40 mEq Oral Q4H   senna-docusate  1 tablet Oral QHS   sodium chloride flush  3 mL Intravenous Q12H   sodium chloride flush  5 mL Intracatheter Q8H   cyanocobalamin  100 mcg Oral Daily  Continuous Infusions:  ampicillin-sulbactam (UNASYN) IV 3 g (10/10/23 1035)    Diet Order              DIET DYS 3 Room service appropriate? Yes; Fluid consistency: Thin  Diet effective now                   Intake/Output Summary (Last 24 hours) at 10/10/2023 1254 Last data filed at 10/10/2023 1610 Gross per 24 hour  Intake 155 ml  Output 1670 ml  Net -1515 ml   Net IO Since Admission: 1,819.04 mL [10/10/23 1254]  Wt Readings from Last 3 Encounters:  10/10/23 98 kg  09/05/23 84.4 kg  01/31/21 89.4 kg     Unresulted Labs (From admission, onward)     Start     Ordered   10/09/23 0500  Basic metabolic panel  Daily,   R     Question:  Specimen collection method  Answer:  Lab=Lab collect   10/08/23 0634   10/09/23 0500  CBC  Daily,   R     Question:  Specimen collection method  Answer:  Lab=Lab collect   10/08/23 0634   09/19/23 2017  Prepare fresh frozen plasma  (**Emergency Blood Administration**)  ONCE - STAT,   STAT       Comments: Emergent release   Question Answer Comment  # of Units 2 units   Special Requirements Other (Specify)   Emergent release call blood bank [facility] [phone]   Attestation: Emergent transfusion, the ordering licensed practitioner has determined that the risk of delaying transfusion outweighs the risk of transfusing incompatible or incompletely tested units.     Placed in "And" Linked Group   09/19/23 2016          Data Reviewed: I have personally reviewed following labs and imaging studies CBC: Recent Labs  Lab 10/05/23 0512 10/06/23 0416 10/07/23 0344 10/07/23 2212 10/08/23 0401 10/09/23 0539 10/10/23 0541  WBC 15.1* 13.1* 9.2  --   --  8.4 8.7  HGB 7.8* 7.4* 7.1* 8.0* 8.3* 8.4* 8.5*  HCT 24.1* 23.4* 22.8* 24.9* 26.3* 26.7* 26.7*  MCV 92.3 91.4 93.1  --   --  92.1 91.4  PLT 394 460* 437*  --   --  440* 389   Basic Metabolic Panel: Recent Labs  Lab 10/04/23 0419 10/05/23 0512 10/06/23 0416 10/07/23 0344 10/08/23 0726 10/09/23 0539 10/10/23 0541  NA 136 137 137 140 138 142 141  K 4.0 4.1 3.7 3.6 4.8  3.5 3.2*  CL 96* 100 100 103 101 105 104  CO2 28 25 26 25 23 27 27   GLUCOSE 100* 86 87 91 86 94 116*  BUN 79* 85* 92* 96* 86* 69* 62*  CREATININE 4.78* 4.82* 4.69* 4.23* 3.46* 3.12* 2.59*  CALCIUM 8.5* 8.1* 8.4* 8.4* 8.4* 8.6* 8.7*  MG 1.9  --   --   --   --   --   --   PHOS 6.0* 7.0* 6.1* 6.0*  --   --   --    GFR:  Estimated Creatinine Clearance: 28 mL/min (A) (by C-G formula based on SCr of 2.59 mg/dL (H)). Liver Function Tests: Recent Labs  Lab 10/04/23 0419 10/05/23 0512 10/06/23 0416 10/07/23 0344  ALBUMIN 1.8* 1.7* 1.7* 2.2*   Recent Results (from the past 240 hour(s))  Resp panel by RT-PCR (RSV, Flu A&B, Covid) Anterior Nasal Swab     Status: None   Collection Time: 10/03/23  5:18 AM   Specimen: Anterior Nasal Swab  Result Value Ref Range Status   SARS Coronavirus 2 by RT PCR NEGATIVE NEGATIVE Final   Influenza A by PCR NEGATIVE NEGATIVE Final   Influenza B by PCR NEGATIVE NEGATIVE Final    Comment: (NOTE) The Xpert Xpress SARS-CoV-2/FLU/RSV plus assay is intended as an aid in the diagnosis of influenza from Nasopharyngeal swab specimens and should not be used as a sole basis for treatment. Nasal washings and aspirates are unacceptable for Xpert Xpress SARS-CoV-2/FLU/RSV testing.  Fact Sheet for Patients: BloggerCourse.com  Fact Sheet for Healthcare Providers: SeriousBroker.it  This test is not yet approved or cleared by the [place] FDA and has been authorized for detection and/or diagnosis of SARS-CoV-2 by FDA under an Emergency Use Authorization (EUA). This EUA will remain in effect (meaning this test can be used) for the duration of the COVID-19 declaration under Section 564(b)(1) of the Act, 21 U.S.C. section 360bbb-3(b)(1), unless the authorization is terminated or revoked.     Resp Syncytial Virus by PCR NEGATIVE NEGATIVE Final    Comment: (NOTE) Fact Sheet for  Patients: BloggerCourse.com  Fact Sheet for Healthcare Providers: SeriousBroker.it  This test is not yet approved or cleared by the [place] FDA and has been authorized for detection and/or diagnosis of SARS-CoV-2 by FDA under an Emergency Use Authorization (EUA). This EUA will remain in effect (meaning this test can be used) for the duration of the COVID-19 declaration under Section 564(b)(1) of the Act, 21 U.S.C. section 360bbb-3(b)(1), unless the authorization is terminated or revoked.  Performed at [facility], [address], [place], [place] [address]   Culture, blood (Routine X 2) w Reflex to ID Panel     Status: None   Collection Time: 10/03/23  5:26 AM   Specimen: BLOOD LEFT HAND  Result Value Ref Range Status   Specimen Description BLOOD LEFT HAND  Final   Special Requests   Final    BOTTLES DRAWN AEROBIC AND ANAEROBIC Blood Culture adequate volume   Culture   Final    NO GROWTH 5 DAYS Performed at [facility], [address], [place], [place] [address]    Report Status 10/08/2023 FINAL  Final  Culture, blood (Routine X 2) w Reflex to ID Panel     Status: None   Collection Time: 10/03/23  5:29 AM   Specimen: BLOOD RIGHT HAND  Result Value Ref Range Status   Specimen Description BLOOD RIGHT HAND  Final   Special Requests   Final    BOTTLES DRAWN AEROBIC AND ANAEROBIC Blood Culture adequate volume   Culture   Final    NO GROWTH 5 DAYS Performed at [facility], [address], [place], [place] [address]    Report Status 10/08/2023 FINAL  Final  Urine Culture (for pregnant, neutropenic or urologic patients or patients with an indwelling urinary catheter)     Status: Abnormal   Collection Time: 10/04/23  9:09 AM   Specimen: Urine, Clean Catch  Result Value Ref Range Status   Specimen Description  URINE, CLEAN CATCH  Final   Special Requests   Final    NONE Performed at [facility]  Lab, [address], [place], [place] [address]    Culture 70,000 COLONIES/mL ENTEROCOCCUS FAECALIS (A)  Final   Report Status 10/07/2023 FINAL  Final   Organism ID, Bacteria ENTEROCOCCUS FAECALIS (A)  Final      Susceptibility   Enterococcus faecalis - MIC*    AMPICILLIN <=2 SENSITIVE Sensitive     NITROFURANTOIN <=16 SENSITIVE Sensitive     VANCOMYCIN 2 SENSITIVE Sensitive     * 70,000 COLONIES/mL ENTEROCOCCUS FAECALIS  Aerobic/Anaerobic Culture w Gram Stain (surgical/deep wound)     Status: None   Collection Time: 10/04/23  4:10 PM   Specimen: Gallbladder; Bile  Result Value Ref Range Status   Specimen Description GALL BLADDER  Final   Special Requests NONE  Final   Gram Stain   Final    RARE WBC PRESENT, PREDOMINANTLY PMN FEW GRAM NEGATIVE RODS    Culture   Final    MODERATE CITROBACTER KOSERI NO ANAEROBES ISOLATED Performed at [facility], [address], [place], [place] [address]    Report Status 10/09/2023 FINAL  Final   Organism ID, Bacteria CITROBACTER KOSERI  Final      Susceptibility   Citrobacter koseri - MIC*    CEFEPIME <=0.12 SENSITIVE Sensitive     CEFTAZIDIME <=1 SENSITIVE Sensitive     CEFTRIAXONE <=0.25 SENSITIVE Sensitive     CIPROFLOXACIN <=0.25 SENSITIVE Sensitive     GENTAMICIN <=1 SENSITIVE Sensitive     IMIPENEM <=0.25 SENSITIVE Sensitive     TRIMETH/SULFA <=20 SENSITIVE Sensitive     PIP/TAZO <=4 SENSITIVE Sensitive ug/mL    * MODERATE CITROBACTER KOSERI   Antimicrobials: Anti-infectives (From admission, onward)    Start     Dose/Rate Route Frequency Ordered Stop   10/07/23 2200  Ampicillin-Sulbactam (UNASYN) 3 g in sodium chloride 0.9 % 100 mL IVPB        3 g 200 mL/hr over 30 Minutes Intravenous Every 12 hours 10/07/23 1215 10/14/23 2359   10/05/23 0000  cefOXitin (MEFOXIN) 2 g in sodium chloride 0.9 % 100 mL IVPB  Status:  Discontinued        2 g 200 mL/hr over 30 Minutes Intravenous To Radiology 10/04/23 1450 10/04/23 1455   10/04/23  1545  cefOXitin (MEFOXIN) 2 g in sodium chloride 0.9 % 100 mL IVPB  Status:  Discontinued        2 g 200 mL/hr over 30 Minutes Intravenous To Radiology 10/04/23 1455 10/04/23 1718   10/03/23 0600  cefTRIAXone (ROCEPHIN) 2 g in sodium chloride 0.9 % 100 mL IVPB  Status:  Discontinued       Note to Pharmacy: Start after blood cultures drawn   2 g 200 mL/hr over 30 Minutes Intravenous Every 24 hours 10/03/23 0514 10/07/23 1215   10/03/23 0600  metroNIDAZOLE (FLAGYL) tablet 500 mg  Status:  Discontinued        500 mg Oral Every 12 hours 10/03/23 0514 10/05/23 0837      Culture/Microbiology Radiology Studies: No results found.  LOS: 20 days  Lanae Boast, MD  [facility] 10/10/2023, 12:54 PM

## 2023-10-10 NOTE — TOC Progression Note (Signed)
Transition of Care [facility]) - Progression Note    Patient Details  Name: Jorge West MRN: [id number] Date of Birth: 1946/06/01  [facility] CM/SW Contact  Eduard Roux, [place] Phone Number: 10/10/2023, 5:29 PM  Clinical Narrative:     CSW met with patient at  bedside  along w/ his son. CSW introduced self and explained role.Patient states he is aware of recommendations for short term rehab at SNF and is agrees with placement at [facility]. No preferred SNF at this time. Requested to send referrals to [place] and [place] area.   TOC will provide bed offers once available. TOC will continue to follow and assist with discharge planning.  Antony Blackbird, MSW, LCSW Clinical Social Worker    Expected Discharge Plan: Skilled Nursing Facility Barriers to Discharge: Continued Medical Work up  Expected Discharge Plan and Services                                               Social Determinants of Health (SDOH) Interventions SDOH Screenings   Food Insecurity: No Food Insecurity (09/20/2023)  Housing: Low Risk  (09/20/2023)  Transportation Needs: No Transportation Needs (09/20/2023)  Utilities: Not At Risk (09/20/2023)  Depression (PHQ2-9): Medium Risk (09/05/2023)  Financial Resource Strain: Low Risk  (04/24/2023)   Received from [facility]  Physical Activity: Inactive (04/24/2023)   Received from [facility], [facility]  Social Connections: Moderately Integrated (04/24/2023)   Received from [facility], [facility]  Stress: No Stress Concern Present (07/18/2023)   Received from [facility]  Tobacco Use: Medium Risk (09/20/2023)    Readmission Risk Interventions     No data to display

## 2023-10-11 ENCOUNTER — Encounter (HOSPITAL_COMMUNITY): Payer: Medicare PPO

## 2023-10-11 ENCOUNTER — Inpatient Hospital Stay (HOSPITAL_COMMUNITY): Payer: Medicare PPO

## 2023-10-11 DIAGNOSIS — N1832 Chronic kidney disease, stage 3b: Secondary | ICD-10-CM | POA: Diagnosis not present

## 2023-10-11 DIAGNOSIS — N179 Acute kidney failure, unspecified: Secondary | ICD-10-CM | POA: Diagnosis not present

## 2023-10-11 LAB — BASIC METABOLIC PANEL
Anion gap: 10 (ref 5–15)
BUN: 45 mg/dL — ABNORMAL HIGH (ref 8–23)
CO2: 30 mmol/L (ref 22–32)
Calcium: 8.8 mg/dL — ABNORMAL LOW (ref 8.9–10.3)
Chloride: 105 mmol/L (ref 98–111)
Creatinine, Ser: 2.19 mg/dL — ABNORMAL HIGH (ref 0.61–1.24)
GFR, Estimated: 30 mL/min — ABNORMAL LOW (ref >60)
Glucose, Bld: 129 mg/dL — ABNORMAL HIGH (ref 70–99)
Potassium: 3.5 mmol/L (ref 3.5–5.1)
Sodium: 145 mmol/L (ref 135–145)

## 2023-10-11 LAB — CBC
HCT: 29.5 % — ABNORMAL LOW (ref 39.0–52.0)
Hemoglobin: 9.3 g/dL — ABNORMAL LOW (ref 13.0–17.0)
MCH: 28.9 pg (ref 26.0–34.0)
MCHC: 31.5 g/dL (ref 30.0–36.0)
MCV: 91.6 fL (ref 80.0–100.0)
Platelets: 478 10*3/uL — ABNORMAL HIGH (ref 150–400)
RBC: 3.22 MIL/uL — ABNORMAL LOW (ref 4.22–5.81)
RDW: 14.3 % (ref 11.5–15.5)
WBC: 12.2 10*3/uL — ABNORMAL HIGH (ref 4.0–10.5)
nRBC: 0 % (ref 0.0–0.2)

## 2023-10-11 MED ORDER — AMIODARONE HCL 200 MG PO TABS
200.0000 mg | ORAL_TABLET | Freq: Every day | ORAL | Status: DC
  Administered 2023-10-11 – 2023-10-31 (×21): 200 mg via ORAL
  Filled 2023-10-11 (×21): qty 1

## 2023-10-11 MED ORDER — POTASSIUM CHLORIDE 10 MEQ/100ML IV SOLN
10.0000 meq | INTRAVENOUS | Status: AC
  Administered 2023-10-11 (×2): 10 meq via INTRAVENOUS
  Filled 2023-10-11 (×2): qty 100

## 2023-10-11 MED ORDER — IOHEXOL 350 MG/ML SOLN: 30.0000 mL | Freq: Once | INTRAVENOUS | Status: DC | PRN

## 2023-10-11 MED ORDER — POTASSIUM CHLORIDE CRYS ER 20 MEQ PO TBCR: 40.0000 meq | EXTENDED_RELEASE_TABLET | Freq: Once | ORAL | Status: DC

## 2023-10-11 MED ORDER — SODIUM CHLORIDE 0.9 % IV SOLN
3.0000 g | Freq: Four times a day (QID) | INTRAVENOUS | Status: AC
Start: 2023-10-11 — End: 2023-10-14
  Administered 2023-10-11 – 2023-10-14 (×14): 3 g via INTRAVENOUS
  Filled 2023-10-11 (×15): qty 8

## 2023-10-11 MED ORDER — ENSURE ENLIVE PO LIQD
237.0000 mL | Freq: Three times a day (TID) | ORAL | Status: DC
  Administered 2023-10-12 – 2023-10-18 (×4): 237 mL via ORAL

## 2023-10-11 NOTE — Plan of Care (Signed)
  Problem: Education: Goal: Knowledge of General Education information will improve Description: Including pain rating scale, medication(s)/side effects and non-pharmacologic comfort measures Outcome: Progressing   Problem: Health Behavior/Discharge Planning: Goal: Ability to manage health-related needs will improve Outcome: Progressing   Problem: Clinical Measurements: Goal: Ability to maintain clinical measurements within normal limits will improve Outcome: Progressing Goal: Will remain free from infection Outcome: Progressing Goal: Diagnostic test results will improve Outcome: Progressing Goal: Respiratory complications will improve Outcome: Progressing Goal: Cardiovascular complication will be avoided Outcome: Progressing   Problem: Activity: Goal: Risk for activity intolerance will decrease Outcome: Progressing   Problem: Coping: Goal: Level of anxiety will decrease Outcome: Progressing   Problem: Elimination: Goal: Will not experience complications related to bowel motility Outcome: Progressing Goal: Will not experience complications related to urinary retention Outcome: Progressing   Problem: Pain Management: Goal: General experience of comfort will improve Outcome: Progressing   Problem: Safety: Goal: Ability to remain free from injury will improve Outcome: Progressing   Problem: Skin Integrity: Goal: Risk for impaired skin integrity will decrease Outcome: Progressing

## 2023-10-11 NOTE — Progress Notes (Signed)
PROGRESS NOTE Jorge West  YTK:[id number] DOB: 1946/05/02 DOA: 09/19/2023 PCP: Elizabeth Palau, FNP  Brief Narrative/Hospital Course: 77 year old with history of CHF, A-fib, CAD, HTN, CKD, BPH presenting with left lower extremity trauma Anteroapical and then falling on the floor. Apparently was trapped for about 2 hours until family found him. Upon admission CT head, cervical spine negative, CT chest abdomen pelvis showed acute T12 compression fracture, CT of lower extremity showed asymmetric enlargement of muscle on the left.  Patient was admitted for left femoral condyle avulsion fracture, severe rhabdomyolysis and AKI on CKD. Patient started on dialysis.  Nephrology is following closely.  Patient had fever episode along with right upper quadrant abdominal pain, [place] 11/12-mild wall thickening gallbladder but no gallstone, placed on antibiotics HIDA scan obtained 11/13> was positive for acute cholecystitis-CCS was consulted, given his comorbidities felt to be high risk for surgical intervention IR consulted and cholecystostomy tube placed 11/13.  Overall or clinically he is afebrile WBC count has normalized, hemoglobin drifted PRN 1 unit PRBC. He remains weak and deconditioned.   Subjective: Seen this am Appears weak, frail, had vomiting after apple sauce and abdomen is bloated. Passing gas. No chest pain. Afebrile overnight heart rate I n130s jsut now BP stable in 150s to 160s on [facility] shows creatinine further improved to 0.1 hemoglobin 9.3 per WBC up to 12.2 Got cleaend.  Assessment and Plan: Principal Problem:   Acute kidney injury superimposed on stage 3b chronic kidney disease (HCC) Active Problems:   Atrial fibrillation with RVR (HCC)   Essential hypertension   Closed left femoral condyle avulsion fracture (HCC)   Coronary artery disease   Anemia of chronic disease   Malnutrition of moderate degree   Persistent atrial fibrillation (HCC)  Oliguric AKI on CKD 3B  2/2  rhabdomyolysis and contrast nephrotoxicity:  Patient needed HD on 11/2-subsequently discontinued and HD cath removed 11/12.  Renal function improving, nephro signed off.  Continue to encourage oral intake as it is poor.  Recent Labs    10/02/23 0515 10/03/23 0422 10/04/23 0419 10/05/23 0512 10/06/23 0416 10/07/23 0344 10/08/23 0726 10/09/23 0539 10/10/23 0541 10/11/23 0437  BUN 63* 71* 79* 85* 92* 96* 86* 69* 62* 45*  CREATININE 4.98* 4.69* 4.78* 4.82* 4.69* 4.23* 3.46* 3.12* 2.59* 2.19*  CO2 29 29 28 25 26 25 23 27 27  30  K 3.5 3.6 4.0 4.1 3.7 3.6 4.8 3.5 3.2* 3.5    Traumatic rhabdomyolysis: CK has improved overall continue oral hydration  Acute cholecystitis s/p cholecystostomy tube 11/13-fluid cx Citrobacter: Patient had fever episode along with abdominal pain, leukocytosis> [place] 11/12-mild wall thickening gallbladder- HIDA11/13> positive for acute cholecystitis-CCS was consulted, given his comorbidities felt to be high risk for surgical intervention  s/p IR cholecystostomy tube placed 11/13 ulture with Citrobacter,blood culture 11/2 NGTD.  Continue Unasyn treatment duration 10 days post drain placement-per iD. Pharmacy.  Recent Labs  Lab 10/06/23 0416 10/07/23 0344 10/09/23 0539 10/10/23 0541 10/11/23 0437  WBC 13.1* 9.2 8.4 8.7 12.2*   Nausea/Vomiting and Abdomen bloating: XRAY ordered. ?ileus. CCS being notified.May need NGT if persistent symptoms.  Monitor electrolytes and correct.  Pyuria-Enterococcus faecalis on culture-pansensitive: On antibiotics as above  Hypokalemia: Resolved  Permanent A Fib with RVR Prolonged Qtc Essential hypertension:: BP initially soft/hypotensive limiting titration of Toprol-XL, was placed on midodrine now BP stable and trending up-we will stop midodrine 11/20 and monitor BP. Continue Toprol and amiodarone and adjust Toprol if needed for HR. holding Entresto for now due to  AKI-resume later  CAD w/ PCI placed 2 months ago: No chest  pain. Cont home Plavix.  Scrotal edema Elevate.   Acute hypoxic respiratory failure: Multifactorial in the setting of CHF, and acute cholecystitis and abdominal pain, continue supplement oxygen, encourage I-S PT OT, ambulate Oob, IS.   Acute on chronic diastolic CHF: TTE- LVEF:60- to 65%, mild LVH, RV with normal systolic function,no significant valvular disease.  Entresto on hold-will resume once creatinine returns to baseline, continue metoprolol as above.  Monitor intake output  Net IO Since Admission: -315.96 mL [10/11/23 1030]   Closed left femoral condyle avulsion fracture/ T12 compression fracture: Seen by orthopedics WBAT, Brace and follow-up outpatient with Dr. Zoe Lan need reconstructive surgery down the road but no acute surgical recommendation advised this admission.Continue PT OT and pain control.     Anemia of chronic disease ABLA: S/p 1 unit PRBC 11/16- hb stable >8 gm, and improving monitor daily Recent Labs  Lab 10/07/23 2212 10/08/23 0401 10/09/23 0539 10/10/23 0541 10/11/23 0437  HGB 8.0* 8.3* 8.4* 8.5* 9.3*  HCT 24.9* 26.3* 26.7* 26.7* 29.5*   Malnutrition of moderate degree Continue nutritional supplements and augment as below Nutrition Problem: Moderate Malnutrition Etiology: chronic illness (heart failure, afib) Signs/Symptoms: mild fat depletion, mild muscle depletion Interventions: Ensure Enlive (each supplement provides 350kcal and 20 grams of protein), MVI, Magic cup, Education  .  Obesity:Patient's Body mass index is 31 kg/m. : Will benefit with PCP follow-up, weight loss  healthy lifestyle and outpatient sleep evaluation.  DVT prophylaxis: SCDs Start: 09/20/23 0018 Place TED hose Start: 09/20/23 0018 eliquis Code Status:   Code Status: Full Code Family Communication: plan of care discussed with patient/no family at bedside. Patient status is: Inpatient because of AKI Level of care: Telemetry Cardiac   Dispo: The patient is from: home w/  wife            Anticipated disposition: SNF likely 1-2 days if available. CIR will likely be high intensity for him  Objective: Vitals last 24 hrs: Vitals:   10/11/23 0500 10/11/23 0600 10/11/23 0619 10/11/23 0749  BP:    (!) 154/104  Pulse: (!) 105 (!) 108 (!) 106 (!) 117  Resp:    18  Temp:    (!) 97.5 F (36.4 C)  TempSrc:    Oral  SpO2:    96%  Weight:      Height:       Weight change:   Physical Examination: General exam: alert awake,weawk, frail,older than stated age HEENT:Oral mucosa moist, Ear/Nose WNL grossly Respiratory system: Bilaterally clear BS,no use of accessory muscle Cardiovascular system: S1 & S2 +, No JVD. Gastrointestinal system: Abdomen soft, distended, BS sluggish, RUQ drain + Nervous System: Alert, awake,non focal and with generalized weakness Extremities: LE edema neg,distal peripheral pulses palpable and warm.  Skin: No rashes,no icterus. MSK: weak muscle bulk,tone, power   Medications reviewed:  Scheduled Meds:  apixaban  5 mg Oral BID   brimonidine  1 drop Left Eye Daily   And   timolol  1 drop Left Eye Daily   buPROPion  300 mg Oral Daily   cholecalciferol  1,000 Units Oral Daily   clopidogrel  75 mg Oral Daily   docusate sodium  100 mg Oral Daily   escitalopram  10 mg Oral Daily   feeding supplement  1 Container Oral TID BM   metoprolol succinate  25 mg Oral Daily   multivitamin  1 tablet Oral QHS   polyethylene glycol  17 g Oral Daily   senna-docusate  1 tablet Oral QHS   sodium chloride flush  3 mL Intravenous Q12H   sodium chloride flush  5 mL Intracatheter Q8H   cyanocobalamin  100 mcg Oral Daily  Continuous Infusions:  ampicillin-sulbactam (UNASYN) IV 3 g (10/11/23 0906)    Diet Order             DIET DYS 3 Room service appropriate? Yes; Fluid consistency: Thin  Diet effective now                   Intake/Output Summary (Last 24 hours) at 10/11/2023 1030 Last data filed at 10/11/2023 1000 Gross per 24 hour  Intake 5  ml  Output 2140 ml  Net -2135 ml   Net IO Since Admission: -315.96 mL [10/11/23 1030]  Wt Readings from Last 3 Encounters:  10/10/23 98 kg  09/05/23 84.4 kg  01/31/21 89.4 kg     Unresulted Labs (From admission, onward)     Start     Ordered   10/09/23 0500  Basic metabolic panel  Daily,   R     Question:  Specimen collection method  Answer:  Lab=Lab collect   10/08/23 0634   10/09/23 0500  CBC  Daily,   R     Question:  Specimen collection method  Answer:  Lab=Lab collect   10/08/23 6295   09/19/23 2017  Prepare fresh frozen plasma  (**Emergency Blood Administration**)  ONCE - STAT,   STAT       Comments: Emergent release   Question Answer Comment  # of Units 2 units   Special Requirements Other (Specify)   Emergent release call blood bank [facility] [phone]   Attestation: Emergent transfusion, the ordering licensed practitioner has determined that the risk of delaying transfusion outweighs the risk of transfusing incompatible or incompletely tested units.     Placed in "And" Linked Group   09/19/23 2016          Data Reviewed: I have personally reviewed following labs and imaging studies CBC: Recent Labs  Lab 10/06/23 0416 10/07/23 0344 10/07/23 2212 10/08/23 0401 10/09/23 0539 10/10/23 0541 10/11/23 0437  WBC 13.1* 9.2  --   --  8.4 8.7 12.2*  HGB 7.4* 7.1* 8.0* 8.3* 8.4* 8.5* 9.3*  HCT 23.4* 22.8* 24.9* 26.3* 26.7* 26.7* 29.5*  MCV 91.4 93.1  --   --  92.1 91.4 91.6  PLT 460* 437*  --   --  440* 389 478*   Basic Metabolic Panel: Recent Labs  Lab 10/05/23 0512 10/06/23 0416 10/07/23 0344 10/08/23 0726 10/09/23 0539 10/10/23 0541 10/11/23 0437  NA 137 137 140 138 142 141 145  K 4.1 3.7 3.6 4.8 3.5 3.2* 3.5  CL 100 100 103 101 105 104 105  CO2 25 26 25 23 27 27 30   GLUCOSE 86 87 91 86 94 116* 129*  BUN 85* 92* 96* 86* 69* 62* 45*  CREATININE 4.82* 4.69* 4.23* 3.46* 3.12* 2.59* 2.19*  CALCIUM 8.1* 8.4* 8.4* 8.4* 8.6* 8.7* 8.8*  PHOS 7.0*  6.1* 6.0*  --   --   --   --    GFR: Estimated Creatinine Clearance: 33.2 mL/min (A) (by C-G formula based on SCr of 2.19 mg/dL (H)). Liver Function Tests: Recent Labs  Lab 10/05/23 0512 10/06/23 0416 10/07/23 0344  ALBUMIN 1.7* 1.7* 2.2*   Recent Results (from the past 240 hour(s))  Resp panel by RT-PCR (RSV, Flu A&B, Covid) Anterior  Nasal Swab     Status: None   Collection Time: 10/03/23  5:18 AM   Specimen: Anterior Nasal Swab  Result Value Ref Range Status   SARS Coronavirus 2 by RT PCR NEGATIVE NEGATIVE Final   Influenza A by PCR NEGATIVE NEGATIVE Final   Influenza B by PCR NEGATIVE NEGATIVE Final    Comment: (NOTE) The Xpert Xpress SARS-CoV-2/FLU/RSV plus assay is intended as an aid in the diagnosis of influenza from Nasopharyngeal swab specimens and should not be used as a sole basis for treatment. Nasal washings and aspirates are unacceptable for Xpert Xpress SARS-CoV-2/FLU/RSV testing.  Fact Sheet for Patients: BloggerCourse.com  Fact Sheet for Healthcare Providers: SeriousBroker.it  This test is not yet approved or cleared by the [place] FDA and has been authorized for detection and/or diagnosis of SARS-CoV-2 by FDA under an Emergency Use Authorization (EUA). This EUA will remain in effect (meaning this test can be used) for the duration of the COVID-19 declaration under Section 564(b)(1) of the Act, 21 U.S.C. section 360bbb-3(b)(1), unless the authorization is terminated or revoked.     Resp Syncytial Virus by PCR NEGATIVE NEGATIVE Final    Comment: (NOTE) Fact Sheet for Patients: BloggerCourse.com  Fact Sheet for Healthcare Providers: SeriousBroker.it  This test is not yet approved or cleared by the [place] FDA and has been authorized for detection and/or diagnosis of SARS-CoV-2 by FDA under an Emergency Use Authorization (EUA). This EUA will  remain in effect (meaning this test can be used) for the duration of the COVID-19 declaration under Section 564(b)(1) of the Act, 21 U.S.C. section 360bbb-3(b)(1), unless the authorization is terminated or revoked.  Performed at [facility], [address] [place], [place] [address]   Culture, blood (Routine X 2) w Reflex to ID Panel     Status: None   Collection Time: 10/03/23  5:26 AM   Specimen: BLOOD LEFT HAND  Result Value Ref Range Status   Specimen Description BLOOD LEFT HAND  Final   Special Requests   Final    BOTTLES DRAWN AEROBIC AND ANAEROBIC Blood Culture adequate volume   Culture   Final    NO GROWTH 5 DAYS Performed at [facility], [address] [place], [place] [address]    Report Status 10/08/2023 FINAL  Final  Culture, blood (Routine X 2) w Reflex to ID Panel     Status: None   Collection Time: 10/03/23  5:29 AM   Specimen: BLOOD RIGHT HAND  Result Value Ref Range Status   Specimen Description BLOOD RIGHT HAND  Final   Special Requests   Final    BOTTLES DRAWN AEROBIC AND ANAEROBIC Blood Culture adequate volume   Culture   Final    NO GROWTH 5 DAYS Performed at [facility], [address], [place], [place] [address]    Report Status 10/08/2023 FINAL  Final  Urine Culture (for pregnant, neutropenic or urologic patients or patients with an indwelling urinary catheter)     Status: Abnormal   Collection Time: 10/04/23  9:09 AM   Specimen: Urine, Clean Catch  Result Value Ref Range Status   Specimen Description URINE, CLEAN CATCH  Final   Special Requests   Final    NONE Performed at [facility], [address], [place], [place] [address]    Culture 70,000 COLONIES/mL ENTEROCOCCUS FAECALIS (A)  Final   Report Status 10/07/2023 FINAL  Final   Organism ID, Bacteria ENTEROCOCCUS FAECALIS (A)  Final      Susceptibility   Enterococcus faecalis - MIC*    AMPICILLIN <=2 SENSITIVE Sensitive     NITROFURANTOIN <=16 SENSITIVE Sensitive      VANCOMYCIN 2 SENSITIVE Sensitive     * 70,000 COLONIES/mL ENTEROCOCCUS FAECALIS  Aerobic/Anaerobic Culture w Gram Stain (surgical/deep wound)     Status: None   Collection Time: 10/04/23  4:10 PM   Specimen: Gallbladder; Bile  Result Value Ref Range Status   Specimen Description GALL BLADDER  Final   Special Requests NONE  Final   Gram Stain   Final    RARE WBC PRESENT, PREDOMINANTLY PMN FEW GRAM NEGATIVE RODS    Culture   Final    MODERATE CITROBACTER KOSERI NO ANAEROBES ISOLATED Performed at [facility], [address] [place], [place] [address]    Report Status 10/09/2023 FINAL  Final   Organism ID, Bacteria CITROBACTER KOSERI  Final      Susceptibility   Citrobacter koseri - MIC*    CEFEPIME <=0.12 SENSITIVE Sensitive     CEFTAZIDIME <=1 SENSITIVE Sensitive     CEFTRIAXONE <=0.25 SENSITIVE Sensitive     CIPROFLOXACIN <=0.25 SENSITIVE Sensitive     GENTAMICIN <=1 SENSITIVE Sensitive     IMIPENEM <=0.25 SENSITIVE Sensitive     TRIMETH/SULFA <=20 SENSITIVE Sensitive     PIP/TAZO <=4 SENSITIVE Sensitive ug/mL    * MODERATE CITROBACTER KOSERI   Antimicrobials: Anti-infectives (From admission, onward)    Start     Dose/Rate Route Frequency Ordered Stop   10/07/23 2200  Ampicillin-Sulbactam (UNASYN) 3 g in sodium chloride 0.9 % 100 mL IVPB        3 g 200 mL/hr over 30 Minutes Intravenous Every 12 hours 10/07/23 1215 10/14/23 2359   10/05/23 0000  cefOXitin (MEFOXIN) 2 g in sodium chloride 0.9 % 100 mL IVPB  Status:  Discontinued        2 g 200 mL/hr over 30 Minutes Intravenous To Radiology 10/04/23 1450 10/04/23 1455   10/04/23 1545  cefOXitin (MEFOXIN) 2 g in sodium chloride 0.9 % 100 mL IVPB  Status:  Discontinued        2 g 200 mL/hr over 30 Minutes Intravenous To Radiology 10/04/23 1455 10/04/23 1718   10/03/23 0600  cefTRIAXone (ROCEPHIN) 2 g in sodium chloride 0.9 % 100 mL IVPB  Status:  Discontinued       Note to Pharmacy: Start after blood cultures drawn    2 g 200 mL/hr over 30 Minutes Intravenous Every 24 hours 10/03/23 0514 10/07/23 1215   10/03/23 0600  metroNIDAZOLE (FLAGYL) tablet 500 mg  Status:  Discontinued        500 mg Oral Every 12 hours 10/03/23 0514 10/05/23 0837      Culture/Microbiology Radiology Studies: No results found.  LOS: 21 days  Lanae Boast, MD  [facility] 10/11/2023, 10:30 AM

## 2023-10-11 NOTE — Progress Notes (Addendum)
Patient with 2 episodes of dark brown emesis this morning (1 during night shift and 1 around 9:45am after ingesting water/applesauce). Pt had 2 BMs overnight after not having one since last Monday 11/11. Abdomen still distended and patient complains of "gas-like" pain. Notified Dr. Jonathon Bellows who ordered a KUB and I notified surgery PA on-call who last saw patient on 11/15 to make them aware of the happenings.   1015: Multiple liquid bowel movements from patient.   1034: 1 view abd xray performed; pt states he feels less nausea now after bowel movements and emesis episode, but still with abdominal distention and discomfort. Keeping NPO until xray read and plan established.   1251: Zofran given iv for nausea despite being NPO except for occasional ice chips. Xray still not read - reached out to [facility] who said they would get it read asap. Dr Jonathon Bellows aware.  1535: Surgery team by to assess patient at bedside - ordered abd CT to confirm xray findings of ileus.   1600: abd CT performed. Awaiting results.   1800: pt had multiple loose bowel movements/gas passed while turning him - felt a little better and tried giving amiodarone PO which he has kept down so far (as of 1855); IV metoprolol given as well per order for sustaining HR >110. Abd CT results not back yet.

## 2023-10-11 NOTE — Progress Notes (Signed)
OT Cancellation Note  Patient Details Name: Jorge West MRN: [id number] DOB: 24-Aug-1946   Cancelled Treatment:    Reason Eval/Treat Not Completed: (P) Pain limiting ability to participate. Pt states pain and discomfort too great to complete any OOB activities or bed mobility. Pt declined therapy today, will check back tomorrow.  Alexis Goodell 10/11/2023, 12:27 PM

## 2023-10-11 NOTE — Progress Notes (Signed)
Subjective/Chief Complaint: N/V, has had a bm, xray with ileus   Objective: Vital signs in last 24 hours: Temp:  [97.5 F (36.4 C)-98.1 F (36.7 C)] 97.8 F (36.6 C) (11/20 1217) Pulse Rate:  [94-128] 125 (11/20 1351) Resp:  [18-20] 18 (11/20 1217) BP: (135-166)/(103-115) 154/115 (11/20 1217) SpO2:  [88 %-98 %] 98 % (11/20 1351) Last BM Date : 10/11/23  Intake/Output from previous day: 11/19 0701 - 11/20 0700 In: 5  Out: 1640 [Urine:1550; Drains:90] Intake/Output this shift: Total I/O In: 5 [Other:5] Out: 660 [Urine:500; Drains:160]  Ab distended not really tender, perc chole in place  Lab Results:  Recent Labs    10/10/23 0541 10/11/23 0437  WBC 8.7 12.2*  HGB 8.5* 9.3*  HCT 26.7* 29.5*  PLT 389 478*   BMET Recent Labs    10/10/23 0541 10/11/23 0437  NA 141 145  K 3.2* 3.5  CL 104 105  CO2 27 30  GLUCOSE 116* 129*  BUN 62* 45*  CREATININE 2.59* 2.19*  CALCIUM 8.7* 8.8*   PT/INR No results for input(s): "LABPROT", "INR" in the last 72 hours. ABG No results for input(s): "PHART", "HCO3" in the last 72 hours.  Invalid input(s): "PCO2", "PO2"  Studies/Results: DG Abd Portable 1V  Result Date: 10/11/2023 CLINICAL DATA:  Distended abdomen. EXAM: PORTABLE ABDOMEN - 1 VIEW COMPARISON:  01/20/2007. FINDINGS: Air distends stomach. Gaseous distention of small bowel and colon. Stool in the rectum. Percutaneous cholecystostomy in the right abdomen. IMPRESSION: Ileus. Electronically Signed   By: Leanna Battles M.D.   On: 10/11/2023 14:19    Anti-infectives: Anti-infectives (From admission, onward)    Start     Dose/Rate Route Frequency Ordered Stop   10/11/23 1600  Ampicillin-Sulbactam (UNASYN) 3 g in sodium chloride 0.9 % 100 mL IVPB        3 g 200 mL/hr over 30 Minutes Intravenous Every 6 hours 10/11/23 1211 10/15/23 0359   10/07/23 2200  Ampicillin-Sulbactam (UNASYN) 3 g in sodium chloride 0.9 % 100 mL IVPB  Status:  Discontinued        3 g 200  mL/hr over 30 Minutes Intravenous Every 12 hours 10/07/23 1215 10/11/23 1211   10/05/23 0000  cefOXitin (MEFOXIN) 2 g in sodium chloride 0.9 % 100 mL IVPB  Status:  Discontinued        2 g 200 mL/hr over 30 Minutes Intravenous To Radiology 10/04/23 1450 10/04/23 1455   10/04/23 1545  cefOXitin (MEFOXIN) 2 g in sodium chloride 0.9 % 100 mL IVPB  Status:  Discontinued        2 g 200 mL/hr over 30 Minutes Intravenous To Radiology 10/04/23 1455 10/04/23 1718   10/03/23 0600  cefTRIAXone (ROCEPHIN) 2 g in sodium chloride 0.9 % 100 mL IVPB  Status:  Discontinued       Note to Pharmacy: Start after blood cultures drawn   2 g 200 mL/hr over 30 Minutes Intravenous Every 24 hours 10/03/23 0514 10/07/23 1215   10/03/23 0600  metroNIDAZOLE (FLAGYL) tablet 500 mg  Status:  Discontinued        500 mg Oral Every 12 hours 10/03/23 0514 10/05/23 0837       Assessment/Plan: Acute cholecystitis  - s/p perc chole 11/13 - has ileus now, would get ct scan as discussed TRH -wbc up a little      Recent LLE crush injury, rhabdomyolysis AKI on CKD CAD with recent PCI 07/2023 on plavix A fib on eliquis CKD HTN CHF EF  40-45%   Jorge West 10/11/2023

## 2023-10-11 NOTE — Progress Notes (Addendum)
Nutrition Follow-up  DOCUMENTATION CODES:   Non-severe (moderate) malnutrition in context of chronic illness  INTERVENTION:  Pending abd xray, if unable to resume diet, recommend initiation of TPN in the setting of malnutrition and limited PO intake throughout admission Suspect pt to be at elevated refeeding risk, if nutrition support indicated, recommend monitoring magnesium, potassium and phosphorus BID x4 days If pt remains on PO diet, continue nutrition supplements: Magic cup TID with meals, each supplement provides 290 kcal and 9 grams of protein Ensure Enlive po TID, each supplement provides 350 kcal and 20 grams of protein. Renal MVI with minerals daily  NUTRITION DIAGNOSIS:   Moderate Malnutrition related to chronic illness (heart failure, afib) as evidenced by mild fat depletion, mild muscle depletion. - remains applicable  GOAL:   Patient will meet greater than or equal to 90% of their needs - goal unmet  MONITOR:   PO intake, Labs, Weight trends, Skin  REASON FOR ASSESSMENT:   Consult Assessment of nutrition requirement/status  ASSESSMENT:   Pt admitted with L femoral condyle avulsion fracture, T12 anterior wedge compression fracture sustained while he was changing a tire on his car and it fell on his leg. PMH significant for heart failure, afib, CAD, paroxysmal afib, HTN, CKD and BPH.  11/1 [facility] placement 11/2 1st iHD session- net UF 1L  11/5 2nd iHD 11/12: TDC removed, Clear liquids  11/13 - acute cholecystitis, cholecystostomy tube placed, NPO then Clears 1814 11/14 - diet re-advanced to dysphagia 3   Briefly spoke with pt and his wife present at bedside. Observed abdomen distended and taut. RN reports pt with 2 episodes of emesis today. Pt denies significant nausea following BM and emesis. Having bowel function. Pending abdominal xray. Concern for ileus versus SBO.   Pt's wife reports that he is still only taking bites of foods at each meal. 2 days ago he  was about to consume 2.5 ensure supplements. Yesterday it was a stretch to consume 2.  Unfortunately, there is limited documentation of meal completions to review over the last week.   Pt would still benefit from supplemental nutrition support if remains inpatient given ongoing malnutrition, limited PO intake and debility.   Reviewed admission weight history.  Admit: 85.3 kg  Current: 98 kg Pt noted to have edema throughout admission.  + mild pitting BLE; perineal   Medications: Vitamin D3 1000 units, colace  Labs: BUN 45, Cr 2.19, GFR 30  R abd drain: 90ml x 24 hours  Diet Order:   Diet Order             DIET DYS 3 Room service appropriate? Yes; Fluid consistency: Thin  Diet effective now                   EDUCATION NEEDS:   Education needs have been addressed  Skin:  Skin Assessment: Reviewed RN Assessment Skin Integrity Issues:: Other (Comment) Other: +2 edema perineal, LLE + RLE non pitting  Last BM:  11/20 x3 type 7 large, red  Height:   Ht Readings from Last 1 Encounters:  09/19/23 5\' 10"  (1.778 m)    Weight:   Wt Readings from Last 1 Encounters:  10/10/23 98 kg    Ideal Body Weight:  75.5 kg  BMI:  Body mass index is 31 kg/m.  Estimated Nutritional Needs:   Kcal:  1900-2100  Protein:  95-110g  Fluid:  >/=1.9L  Drusilla Kanner, RDN, LDN Clinical Nutrition

## 2023-10-11 NOTE — [place] FL2 (Signed)
[place] MEDICAID FL2 LEVEL OF CARE FORM     IDENTIFICATION  Patient Name: Jorge West Birthdate: 12-09-1945 Sex: male Admission Date (Current Location): 09/19/2023  [facility] and [place] Number:  [employer] and Address:  [facility], [address], [place], [place] [address]      Provider Number: [id number]  Attending Physician Name and Address:  Lanae Boast, MD  Relative Name and Phone Number:       Current Level of Care: SNF Recommended Level of Care: Skilled Nursing Facility Prior Approval Number:    Date Approved/Denied:   PASRR Number: [id number]  Discharge Plan: SNF    Current Diagnoses: Patient Active Problem List   Diagnosis Date Noted   Malnutrition of moderate degree 09/26/2023   Persistent atrial fibrillation (HCC) 09/24/2023   Coronary artery disease 09/23/2023   Anemia of chronic disease 09/21/2023   Acute on chronic diastolic CHF (congestive heart failure) (HCC) 09/20/2023   Hypotensive episode 09/20/2023   Acute kidney injury superimposed on stage 3b chronic kidney disease (HCC) 09/20/2023   Thoracic ascending aortic aneurysm (HCC) 09/20/2023   Lactic acidosis 09/20/2023   Thyroid nodule 09/20/2023   Atrial fibrillation with RVR (HCC) 09/20/2023   Thoracic vertebral fracture (HCC) 09/19/2023   Closed left femoral condyle avulsion fracture (HCC) 09/19/2023   Acute hypoxic respiratory failure (HCC) 09/19/2023   Rhabdomyolysis 09/19/2023   COVID-19 virus infection 10/24/2019   AKI (acute kidney injury) (HCC) 10/24/2019   Hypokalemia 10/24/2019   Syncope and collapse 10/24/2019   Syncope 10/23/2019   Paroxysmal A-fib (HCC) 06/02/2019   Essential hypertension 06/02/2019   HLD (hyperlipidemia) 06/02/2019   Chest pain in adult 06/02/2019   Chronic diastolic CHF (congestive heart failure) (HCC)/EF 60 to 65 % 06/02/2019   Metacarpal bone fracture 03/15/2012   CLOSED FRACTURE OF LATERAL MALLEOLUS 01/03/2011    DERANGEMENT MENISCUS 02/02/2009   KNEE PAIN 02/02/2009   History of colonic polyps 04/19/2005    Orientation RESPIRATION BLADDER Height & Weight     Self, Time, Situation, Place  O2 External catheter, Continent Weight: 216 lb 0.8 oz (98 kg) Height:  5\' 10"  (177.8 cm)  BEHAVIORAL SYMPTOMS/MOOD NEUROLOGICAL BOWEL NUTRITION STATUS      Incontinent Diet (please see discharge summary)  AMBULATORY STATUS COMMUNICATION OF NEEDS Skin   Extensive Assist Verbally Normal                       Personal Care Assistance Level of Assistance  Bathing, Feeding, Dressing Bathing Assistance: Maximum assistance Feeding assistance: Limited assistance Dressing Assistance: Maximum assistance     Functional Limitations Info  Sight, Hearing, Speech Sight Info: Impaired (glasses) Hearing Info: Impaired Speech Info: Adequate    SPECIAL CARE FACTORS FREQUENCY  PT (By licensed PT), OT (By licensed OT)     PT Frequency: 5x per week OT Frequency: 5x per week            Contractures Contractures Info: Not present    Additional Factors Info  Code Status, Allergies Code Status Info: FULL Allergies Info: Terazosin Hcl           Current Medications (10/11/2023):  This is the current hospital active medication list Current Facility-Administered Medications  Medication Dose Route Frequency Provider Last Rate Last Admin   acetaminophen (TYLENOL) tablet 1,000 mg  1,000 mg Oral Q6H PRN Dolly Rias, MD   1,000 mg at 10/10/23 1208   ALPRAZolam (XANAX) tablet 0.5 mg  0.5 mg  Oral TID PRN Arrien, York Ram, MD   0.5 mg at 10/11/23 0901   amiodarone (PACERONE) tablet 200 mg  200 mg Oral Daily Kc, Ramesh, MD       Ampicillin-Sulbactam (UNASYN) 3 g in sodium chloride 0.9 % 100 mL IVPB  3 g Intravenous Q12H Kc, Dayna Barker, MD 200 mL/hr at 10/11/23 0906 3 g at 10/11/23 0906   apixaban (ELIQUIS) tablet 5 mg  5 mg Oral BID Kc, Dayna Barker, MD   5 mg at 10/11/23 0901   brimonidine (ALPHAGAN) 0.2 %  ophthalmic solution 1 drop  1 drop Left Eye Daily Arrien, York Ram, MD   1 drop at 10/11/23 1610   And   timolol (TIMOPTIC) 0.5 % ophthalmic solution 1 drop  1 drop Left Eye Daily Arrien, York Ram, MD   1 drop at 10/11/23 0907   buPROPion (WELLBUTRIN XL) 24 hr tablet 300 mg  300 mg Oral Daily Enedina Finner, MD   300 mg at 10/11/23 9604   cholecalciferol (VITAMIN D3) 25 MCG (1000 UNIT) tablet 1,000 Units  1,000 Units Oral Daily Enedina Finner, MD   1,000 Units at 10/11/23 0901   clopidogrel (PLAVIX) tablet 75 mg  75 mg Oral Daily Arrien, York Ram, MD   75 mg at 10/11/23 5409   docusate sodium (COLACE) capsule 100 mg  100 mg Oral Daily Eric Form, PA-C   100 mg at 10/11/23 0901   escitalopram (LEXAPRO) tablet 10 mg  10 mg Oral Daily Enedina Finner, MD   10 mg at 10/11/23 8119   feeding supplement (BOOST / RESOURCE BREEZE) liquid 1 Container  1 Container Oral TID BM Lanae Boast, MD   1 Container at 10/10/23 2103   guaiFENesin (ROBITUSSIN) 100 MG/5ML liquid 5 mL  5 mL Oral Q4H PRN Amin, Ankit C, MD       HYDROmorphone (DILAUDID) injection 0.5 mg  0.5 mg Intravenous Q3H PRN Arrien, York Ram, MD   0.5 mg at 09/28/23 0050   ipratropium-albuterol (DUONEB) 0.5-2.5 (3) MG/3ML nebulizer solution 3 mL  3 mL Nebulization Q4H PRN Amin, Ankit C, MD       metoprolol succinate (TOPROL-XL) 24 hr tablet 25 mg  25 mg Oral Daily Jake Bathe, MD   25 mg at 10/11/23 1478   metoprolol tartrate (LOPRESSOR) injection 5 mg  5 mg Intravenous Q4H PRN Amin, Ankit C, MD   5 mg at 10/11/23 0951   multivitamin (RENA-VIT) tablet 1 tablet  1 tablet Oral QHS Arrien, York Ram, MD   1 tablet at 10/10/23 2102   nitroGLYCERIN (NITROSTAT) SL tablet 0.4 mg  0.4 mg Sublingual Q5 min PRN [name], Subrina, MD       ondansetron [facility]) injection 4 mg  4 mg Intravenous Q6H PRN [name], Subrina, MD   4 mg at 10/11/23 0549   oxyCODONE (Oxy IR/ROXICODONE) immediate release tablet 5 mg  5 mg Oral Q4H PRN Arrien,  York Ram, MD   5 mg at 10/06/23 1002   polyethylene glycol (MIRALAX / GLYCOLAX) packet 17 g  17 g Oral Daily Carl Best H, PA-C   17 g at 10/11/23 0900   potassium chloride 10 mEq in 100 mL IVPB  10 mEq Intravenous Q1 Hr x 2 Kc, Ramesh, MD       senna-docusate (Senokot-S) tablet 1 tablet  1 tablet Oral QHS Eric Form, PA-C   1 tablet at 10/10/23 2102   sodium chloride flush (NS) 0.9 % injection 3 mL  3 mL  Intravenous Q12H Sundil, Subrina, MD   3 mL at 10/11/23 0904   sodium chloride flush (NS) 0.9 % injection 3 mL  3 mL Intravenous PRN Sundil, Subrina, MD       sodium chloride flush (NS) 0.9 % injection 5 mL  5 mL Intracatheter Q8H Gilmer Mor, DO   5 mL at 10/11/23 0550   traZODone (DESYREL) tablet 50 mg  50 mg Oral QHS PRN Amin, Ankit C, MD   50 mg at 10/10/23 2257   vitamin B-12 (CYANOCOBALAMIN) tablet 100 mcg  100 mcg Oral Daily Enedina Finner, MD   100 mcg at 10/11/23 2130     Discharge Medications: Please see discharge summary for a list of discharge medications.  Relevant Imaging Results:  Relevant Lab Results:   Additional Information SSN [id number]  Eduard Roux, LCSW

## 2023-10-12 DIAGNOSIS — N1832 Chronic kidney disease, stage 3b: Secondary | ICD-10-CM | POA: Diagnosis not present

## 2023-10-12 DIAGNOSIS — N179 Acute kidney failure, unspecified: Secondary | ICD-10-CM | POA: Diagnosis not present

## 2023-10-12 LAB — CBC
HCT: 28.8 % — ABNORMAL LOW (ref 39.0–52.0)
Hemoglobin: 8.9 g/dL — ABNORMAL LOW (ref 13.0–17.0)
MCH: 28.3 pg (ref 26.0–34.0)
MCHC: 30.9 g/dL (ref 30.0–36.0)
MCV: 91.7 fL (ref 80.0–100.0)
Platelets: 216 10*3/uL (ref 150–400)
RBC: 3.14 MIL/uL — ABNORMAL LOW (ref 4.22–5.81)
RDW: 14.6 % (ref 11.5–15.5)
WBC: 12.5 10*3/uL — ABNORMAL HIGH (ref 4.0–10.5)
nRBC: 0 % (ref 0.0–0.2)

## 2023-10-12 LAB — BASIC METABOLIC PANEL
Anion gap: 11 (ref 5–15)
BUN: 53 mg/dL — ABNORMAL HIGH (ref 8–23)
CO2: 28 mmol/L (ref 22–32)
Calcium: 8.4 mg/dL — ABNORMAL LOW (ref 8.9–10.3)
Chloride: 103 mmol/L (ref 98–111)
Creatinine, Ser: 2.38 mg/dL — ABNORMAL HIGH (ref 0.61–1.24)
GFR, Estimated: 27 mL/min — ABNORMAL LOW (ref >60)
Glucose, Bld: 105 mg/dL — ABNORMAL HIGH (ref 70–99)
Potassium: 3.5 mmol/L (ref 3.5–5.1)
Sodium: 142 mmol/L (ref 135–145)

## 2023-10-12 NOTE — Progress Notes (Signed)
CT shows adynamic colonic ileus without obstruction. Percutaneous cholecystostomy tube in good position with gallbladder decompression. No urgent surgical intervention indicated. Continue management of ileus as per primary and general surgery will remain available as neeeded.  Eric Form, [facility] Surgery 10/12/2023, 7:58 AM Please see Amion for pager number during day hours 7:00am-4:30pm
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
## 2023-10-12 NOTE — TOC Progression Note (Signed)
Transition of Care [facility]) - Progression Note    Patient Details  Name: JEMERY UMSTEAD MRN: [id number] Date of Birth: Oct 12, 1946  [facility] CM/SW Contact  Eduard Roux, [place] Phone Number: 10/12/2023, 11:45 AM  Clinical Narrative:     CSW met with patient and his spouse. Patient and family remains agreeable to short term rehab at [facility]. CSW provide patient/family with bed offers along with medicare.gov star ratings. Preferred SNFs [facility] and [address]  TOC will continue to follow and assist with discharge planning.  Antony Blackbird, MSW, LCSW Clinical Social Worker    Expected Discharge Plan: Skilled Nursing Facility Barriers to Discharge: Continued Medical Work up  Expected Discharge Plan and Services                                               Social Determinants of Health (SDOH) Interventions SDOH Screenings   Food Insecurity: No Food Insecurity (09/20/2023)  Housing: Low Risk  (09/20/2023)  Transportation Needs: No Transportation Needs (09/20/2023)  Utilities: Not At Risk (09/20/2023)  Depression (PHQ2-9): Medium Risk (09/05/2023)  Financial Resource Strain: Low Risk  (04/24/2023)   Received from [facility], [facility]  Physical Activity: Inactive (04/24/2023)   Received from [facility], [facility]  Social Connections: Moderately Integrated (04/24/2023)   Received from [facility], [facility]  Stress: No Stress Concern Present (07/18/2023)   Received from [facility]  Tobacco Use: Medium Risk (09/20/2023)    Readmission Risk Interventions     No data to display
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
## 2023-10-12 NOTE — Progress Notes (Signed)
PROGRESS NOTE    Jorge West  KZS:[id number] DOB: [birth year] DOA: 09/19/2023 PCP: Jorge Palau, FNP  Chief Complaint  Patient presents with   Trauma   Level 1   Crush Injury    Brief Narrative:   77 year old with history of CHF, Jorge West, CAD, HTN, CKD, BPH presenting with left lower extremity trauma Anteroapical and then falling on the floor. Apparently was trapped for about 2 hours until family found him. Upon admission CT head, cervical spine negative, CT chest abdomen pelvis showed acute T12 compression fracture, CT of lower extremity showed asymmetric enlargement of muscle on the left.  Patient was admitted for left femoral condyle avulsion fracture, severe rhabdomyolysis and AKI on CKD. Patient started on dialysis.  Nephrology is following closely.  Patient had fever episode along with right upper quadrant abdominal pain, [place] 11/12-mild wall thickening gallbladder but no gallstone, placed on antibiotics HIDA scan obtained 11/13> was positive for acute cholecystitis-CCS was consulted, given his comorbidities felt to be high risk for surgical intervention IR consulted and cholecystostomy tube placed 11/13.  Overall or clinically he is afebrile WBC count has normalized, hemoglobin drifted PRN 1 unit PRBC. He remains weak and deconditioned.   Assessment & Plan:   Principal Problem:   Acute kidney injury superimposed on stage 3b chronic kidney disease (HCC) Active Problems:   Atrial fibrillation with RVR (HCC)   Essential hypertension   Closed left femoral condyle avulsion fracture (HCC)   Coronary artery disease   Anemia of chronic disease   Malnutrition of moderate degree   Persistent atrial fibrillation (HCC)  Oliguric AKI on CKD 3B  2/2 rhabdomyolysis and contrast nephrotoxicity:  Patient needed HD on 11/2-subsequently discontinued and HD cath removed 11/12.  Renal function improving, nephro signed off.  Continue to encourage oral intake as it is poor.  Renal function  not yet to baseline  Traumatic rhabdomyolysis: CK has improved overall continue oral hydration   Acute cholecystitis s/p cholecystostomy tube 11/13-fluid cx Citrobacter: HIDA 11/13  positive for acute cholecystitis CCS was consulted, given his comorbidities felt to be high risk for surgical intervention  s/p IR cholecystostomy tube placed 11/13 culture with Citrobacter,blood culture 11/2 NGTD.  Continue Unasyn treatment duration 10 days post drain placement-per iD. Pharmacy.   Ileus  CT with diffuse colonic distension with multiple gas fluid levels c/w adynamic colonic ileus, gastric distension Clear liquids for now, will advance as tolerated   Enterococcus faecalis UTI: On antibiotics as above   Acute hypoxic respiratory failure: Multifactorial in the setting of CHF/volume overload, acute cholecystitis and abdominal pain, continue supplement oxygen, encourage I-S PT OT, ambulate Oob, IS.   Acute on chronic diastolic CHF Volume Overload Appears overloaded on exam (CT yesterday showed small bilateral effusions, trace ascites, and body wall edema) Echo 08/2023 with EF 40-45% Continue metoprolol Hold entresto due to AKI Hold diuresis for now  Scrotal edema Elevate.   Permanent Jorge West Fib with RVR Prolonged Qtc Essential hypertension:: BP initially soft/hypotensive limiting titration of Toprol-XL, was placed on midodrine now BP stable and trending up-we will stop midodrine 11/20 and monitor BP. Continue Toprol and amiodarone and adjust Toprol if needed for HR.  Will repeat EKG for qtc eval  Communited Minimally Displaced Tibial Plateau Fracture Nondisplaced Fracture of the Fibular Head Mildly Displaced Cortical Avulsion Fracture at the MCL attachment site of the medial femoral condyle Lipohemarthrosis Per ortho note 10/30, WBAT with ROM knee brace.  Dr. Aundria Rud to follow, may need reconstructive surgery down the road.  CAD w/ PCI placed 2 months ago: No chest pain. Cont home  Plavix.  Hypokalemia: Resolved  Anemia of chronic disease ABLA: S/p 1 unit pRBC 11/16 (2 units FFP were given on 10/29) B12, folate, iron, ferritin  Malnutrition of moderate degree Continue nutritional supplements and augment as below Appreciate RD  . Obesity Body mass index is 27.2 kg/m.      DVT prophylaxis: eliquis Code Status: full Family Communication: wife Disposition:   Status is: Inpatient Remains inpatient appropriate because: pending improvement   Consultants:  surgery  Procedures:  11/13 IMPRESSION: Status post percutaneous cholecystostomy  11/1 IMPRESSION: Status post right IJ temporary hemodialysis catheter  Antimicrobials:  Anti-infectives (From admission, onward)    Start     Dose/Rate Route Frequency Ordered Stop   10/11/23 1600  Ampicillin-Sulbactam (UNASYN) 3 g in sodium chloride 0.9 % 100 mL IVPB        3 g 200 mL/hr over 30 Minutes Intravenous Every 6 hours 10/11/23 1211 10/15/23 0359   10/07/23 2200  Ampicillin-Sulbactam (UNASYN) 3 g in sodium chloride 0.9 % 100 mL IVPB  Status:  Discontinued        3 g 200 mL/hr over 30 Minutes Intravenous Every 12 hours 10/07/23 1215 10/11/23 1211   10/05/23 0000  cefOXitin (MEFOXIN) 2 g in sodium chloride 0.9 % 100 mL IVPB  Status:  Discontinued        2 g 200 mL/hr over 30 Minutes Intravenous To Radiology 10/04/23 1450 10/04/23 1455   10/04/23 1545  cefOXitin (MEFOXIN) 2 g in sodium chloride 0.9 % 100 mL IVPB  Status:  Discontinued        2 g 200 mL/hr over 30 Minutes Intravenous To Radiology 10/04/23 1455 10/04/23 1718   10/03/23 0600  cefTRIAXone (ROCEPHIN) 2 g in sodium chloride 0.9 % 100 mL IVPB  Status:  Discontinued       Note to Pharmacy: Start after blood cultures drawn   2 g 200 mL/hr over 30 Minutes Intravenous Every 24 hours 10/03/23 0514 10/07/23 1215   10/03/23 0600  metroNIDAZOLE (FLAGYL) tablet 500 mg  Status:  Discontinued        500 mg Oral Every 12 hours 10/03/23 0514 10/05/23  0837       Subjective: No complaints Wife at bedside Did ok with water earlier   Objective: Vitals:   10/12/23 0415 10/12/23 0629 10/12/23 0735 10/12/23 1126  BP: 138/84  (!) 133/94 116/86  Pulse: (!) 108 (!) 113 100 (!) 108  Resp: 17     Temp: 98.2 F (36.8 C)  97.7 F (36.5 C) 97.6 F (36.4 C)  TempSrc: Oral  Oral Oral  SpO2: 98% 98% 95% 96%  Weight:  86 kg    Height:        Intake/Output Summary (Last 24 hours) at 10/12/2023 1133 Last data filed at 10/11/2023 2133 Gross per 24 hour  Intake 10 ml  Output 1355 ml  Net -1345 ml   Filed Weights   10/09/23 0500 10/10/23 0335 10/12/23 0629  Weight: 96 kg 98 kg 86 kg    Examination:  General exam: Appears calm and comfortable  Respiratory system: unlabored Cardiovascular system: irregularly irregular Gastrointestinal system: distended, perc chole tube with bilious fluid Central nervous system: Alert and oriented. No focal neurological deficits. Extremities: bilateral LE edema - LLE knee brace   Data Reviewed: I have personally reviewed following labs and imaging studies  CBC: Recent Labs  Lab 10/07/23 0344 10/07/23 2212 10/08/23 0401 10/09/23  8119 10/10/23 0541 10/11/23 0437 10/12/23 0405  WBC 9.2  --   --  8.4 8.7 12.2* 12.5*  HGB 7.1*   < > 8.3* 8.4* 8.5* 9.3* 8.9*  HCT 22.8*   < > 26.3* 26.7* 26.7* 29.5* 28.8*  MCV 93.1  --   --  92.1 91.4 91.6 91.7  PLT 437*  --   --  440* 389 478* 216   < > = values in this interval not displayed.    Basic Metabolic Panel: Recent Labs  Lab 10/06/23 0416 10/07/23 0344 10/08/23 0726 10/09/23 0539 10/10/23 0541 10/11/23 0437 10/12/23 0405  NA 137 140 138 142 141 145 142  K 3.7 3.6 4.8 3.5 3.2* 3.5 3.5  CL 100 103 101 105 104 105 103  CO2 26 25 23 27 27 30 28   GLUCOSE 87 91 86 94 116* 129* 105*  BUN 92* 96* 86* 69* 62* 45* 53*  CREATININE 4.69* 4.23* 3.46* 3.12* 2.59* 2.19* 2.38*  CALCIUM 8.4* 8.4* 8.4* 8.6* 8.7* 8.8* 8.4*  PHOS 6.1* 6.0*  --   --    --   --   --     GFR: Estimated Creatinine Clearance: 26.8 mL/min (Madisson Kulaga) (by C-G formula based on SCr of 2.38 mg/dL (H)).  Liver Function Tests: Recent Labs  Lab 10/06/23 0416 10/07/23 0344  ALBUMIN 1.7* 2.2*    CBG: Recent Labs  Lab 10/06/23 1151  GLUCAP 103*     Recent Results (from the past 240 hour(s))  Resp panel by RT-PCR (RSV, Flu Glenys Snader&B, Covid) Anterior Nasal Swab     Status: None   Collection Time: 10/03/23  5:18 AM   Specimen: Anterior Nasal Swab  Result Value Ref Range Status   SARS Coronavirus 2 by RT PCR NEGATIVE NEGATIVE Final   Influenza Arsalan Brisbin by PCR NEGATIVE NEGATIVE Final   Influenza B by PCR NEGATIVE NEGATIVE Final    Comment: (NOTE) The Xpert Xpress SARS-CoV-2/FLU/RSV plus assay is intended as an aid in the diagnosis of influenza from Nasopharyngeal swab specimens and should not be used as Rinda Rollyson sole basis for treatment. Nasal washings and aspirates are unacceptable for Xpert Xpress SARS-CoV-2/FLU/RSV testing.  Fact Sheet for Patients: BloggerCourse.com  Fact Sheet for Healthcare Providers: SeriousBroker.it  This test is not yet approved or cleared by the [place] FDA and has been authorized for detection and/or diagnosis of SARS-CoV-2 by FDA under an Emergency Use Authorization (EUA). This EUA will remain in effect (meaning this test can be used) for the duration of the COVID-19 declaration under Section 564(b)(1) of the Act, 21 U.S.C. section 360bbb-3(b)(1), unless the authorization is terminated or revoked.     Resp Syncytial Virus by PCR NEGATIVE NEGATIVE Final    Comment: (NOTE) Fact Sheet for Patients: BloggerCourse.com  Fact Sheet for Healthcare Providers: SeriousBroker.it  This test is not yet approved or cleared by the [place] FDA and has been authorized for detection and/or diagnosis of SARS-CoV-2 by FDA under an Emergency Use  Authorization (EUA). This EUA will remain in effect (meaning this test can be used) for the duration of the COVID-19 declaration under Section 564(b)(1) of the Act, 21 U.S.C. section 360bbb-3(b)(1), unless the authorization is terminated or revoked.  Performed at [facility], [address] [place], [place] [address]   Culture, blood (Routine X 2) w Reflex to ID Panel     Status: None   Collection Time: 10/03/23  5:26 AM   Specimen: BLOOD LEFT HAND  Result Value Ref  Range Status   Specimen Description BLOOD LEFT HAND  Final   Special Requests   Final    BOTTLES DRAWN AEROBIC AND ANAEROBIC Blood Culture adequate volume   Culture   Final    NO GROWTH 5 DAYS Performed at [facility], [address], [place], [place] [address]    Report Status 10/08/2023 FINAL  Final  Culture, blood (Routine X 2) w Reflex to ID Panel     Status: None   Collection Time: 10/03/23  5:29 AM   Specimen: BLOOD RIGHT HAND  Result Value Ref Range Status   Specimen Description BLOOD RIGHT HAND  Final   Special Requests   Final    BOTTLES DRAWN AEROBIC AND ANAEROBIC Blood Culture adequate volume   Culture   Final    NO GROWTH 5 DAYS Performed at [facility], [address] [place], [place] [address]    Report Status 10/08/2023 FINAL  Final  Urine Culture (for pregnant, neutropenic or urologic patients or patients with an indwelling urinary catheter)     Status: Abnormal   Collection Time: 10/04/23  9:09 AM   Specimen: Urine, Clean Catch  Result Value Ref Range Status   Specimen Description URINE, CLEAN CATCH  Final   Special Requests   Final    NONE Performed at [facility], [address] [place], [place] [address]    Culture 70,000 COLONIES/mL ENTEROCOCCUS FAECALIS (Kenley Rettinger)  Final   Report Status 10/07/2023 FINAL  Final   Organism ID, Bacteria ENTEROCOCCUS FAECALIS (Terron Merfeld)  Final      Susceptibility   Enterococcus faecalis - MIC*    AMPICILLIN <=2 SENSITIVE Sensitive      NITROFURANTOIN <=16 SENSITIVE Sensitive     VANCOMYCIN 2 SENSITIVE Sensitive     * 70,000 COLONIES/mL ENTEROCOCCUS FAECALIS  Aerobic/Anaerobic Culture w Gram Stain (surgical/deep wound)     Status: None   Collection Time: 10/04/23  4:10 PM   Specimen: Gallbladder; Bile  Result Value Ref Range Status   Specimen Description GALL BLADDER  Final   Special Requests NONE  Final   Gram Stain   Final    RARE WBC PRESENT, PREDOMINANTLY PMN FEW GRAM NEGATIVE RODS    Culture   Final    MODERATE CITROBACTER KOSERI NO ANAEROBES ISOLATED Performed at [facility], [address], [place], [place] [address]    Report Status 10/09/2023 FINAL  Final   Organism ID, Bacteria CITROBACTER KOSERI  Final      Susceptibility   Citrobacter koseri - MIC*    CEFEPIME <=0.12 SENSITIVE Sensitive     CEFTAZIDIME <=1 SENSITIVE Sensitive     CEFTRIAXONE <=0.25 SENSITIVE Sensitive     CIPROFLOXACIN <=0.25 SENSITIVE Sensitive     GENTAMICIN <=1 SENSITIVE Sensitive     IMIPENEM <=0.25 SENSITIVE Sensitive     TRIMETH/SULFA <=20 SENSITIVE Sensitive     PIP/TAZO <=4 SENSITIVE Sensitive ug/mL    * MODERATE CITROBACTER KOSERI         Radiology Studies: CT ABDOMEN PELVIS WO CONTRAST  Result Date: 10/11/2023 CLINICAL DATA:  Distended abdomen, abnormal x-ray EXAM: CT ABDOMEN AND PELVIS WITHOUT CONTRAST TECHNIQUE: Multidetector CT imaging of the abdomen and pelvis was performed following the standard protocol without IV contrast. Unenhanced CT was performed per clinician order. Lack of IV contrast limits sensitivity and specificity, especially for evaluation of abdominal/pelvic solid viscera. RADIATION DOSE REDUCTION: This exam was performed according to the departmental dose-optimization program which includes automated  exposure control, adjustment of the mA and/or kV according to patient size and/or use of iterative reconstruction technique. COMPARISON:  10/11/2023, 09/19/2023 FINDINGS: Lower chest: Small  bilateral pleural effusions with dependent lower lobe atelectasis. Cardiomegaly without pericardial effusion. Hepatobiliary: Unremarkable unenhanced appearance of the liver. Percutaneous cholecystostomy tube is identified, with complete decompression of the gallbladder. No biliary duct dilation. Pancreas: Unremarkable unenhanced appearance. Spleen: Unremarkable unenhanced appearance. Adrenals/Urinary Tract: No urinary tract calculi or obstructive uropathy. The adrenals and bladder are unremarkable. Stomach/Bowel: No evidence of bowel obstruction. Moderate gastric distension. There is gaseous distention of the colon with scattered gas fluid levels, favor adynamic colonic ileus. Scattered sigmoid diverticulosis without diverticulitis. No bowel wall thickening or inflammatory change. Normal appendix right lower quadrant. Vascular/Lymphatic: Aortic atherosclerosis. No enlarged abdominal or pelvic lymph nodes. Reproductive: Prostate is unremarkable. Other: Trace free fluid within the bilateral flanks. No free intraperitoneal gas. Stable fat containing paraumbilical hernia. Musculoskeletal: Subcutaneous edema within the bilateral flanks and visualized lower extremities. Stable anterior wedge compression deformity at T12. No acute fractures. Reconstructed images demonstrate no additional findings. IMPRESSION: 1. Diffuse colonic distension with multiple gas fluid levels, consistent with adynamic colonic ileus. No evidence of small-bowel obstruction. 2. Gastric distension. Consideration could be given for decompression with enteric catheter. 3. Percutaneous cholecystostomy tube, with complete decompression of the gallbladder. 4. Small bilateral pleural effusions, with dependent lower lobe atelectasis. 5. Trace ascites. 6. Body wall edema within the lower abdomen and pelvis. 7.  Aortic Atherosclerosis (ICD10-I70.0). Electronically Signed   By: Sharlet Salina M.D.   On: 10/11/2023 19:37   DG Abd Portable 1V  Result Date:  10/11/2023 CLINICAL DATA:  Distended abdomen. EXAM: PORTABLE ABDOMEN - 1 VIEW COMPARISON:  01/20/2007. FINDINGS: Air distends stomach. Gaseous distention of small bowel and colon. Stool in the rectum. Percutaneous cholecystostomy in the right abdomen. IMPRESSION: Ileus. Electronically Signed   By: Leanna Battles M.D.   On: 10/11/2023 14:19        Scheduled Meds:  amiodarone  200 mg Oral Daily   apixaban  5 mg Oral BID   brimonidine  1 drop Left Eye Daily   And   timolol  1 drop Left Eye Daily   buPROPion  300 mg Oral Daily   cholecalciferol  1,000 Units Oral Daily   clopidogrel  75 mg Oral Daily   docusate sodium  100 mg Oral Daily   escitalopram  10 mg Oral Daily   feeding supplement  237 mL Oral TID BM   metoprolol succinate  25 mg Oral Daily   multivitamin  1 tablet Oral QHS   polyethylene glycol  17 g Oral Daily   senna-docusate  1 tablet Oral QHS   sodium chloride flush  3 mL Intravenous Q12H   sodium chloride flush  5 mL Intracatheter Q8H   cyanocobalamin  100 mcg Oral Daily   Continuous Infusions:  ampicillin-sulbactam (UNASYN) IV 3 g (10/12/23 0945)     LOS: 22 days    Time spent: over 30 min     Lacretia Nicks, MD [facility]   To contact the attending provider between 7A-7P or the covering provider during after hours 7P-7A, please log into the web site [email or web address] and access using universal [place] password for that web site. If you do not have the password, please call the hospital operator.  10/12/2023, 11:33 AM

## 2023-10-13 ENCOUNTER — Encounter (HOSPITAL_COMMUNITY): Payer: Medicare PPO

## 2023-10-13 DIAGNOSIS — N1832 Chronic kidney disease, stage 3b: Secondary | ICD-10-CM | POA: Diagnosis not present

## 2023-10-13 DIAGNOSIS — N179 Acute kidney failure, unspecified: Secondary | ICD-10-CM | POA: Diagnosis not present

## 2023-10-13 LAB — MAGNESIUM: Magnesium: 1.8 mg/dL (ref 1.7–2.4)

## 2023-10-13 LAB — CBC
HCT: 26.6 % — ABNORMAL LOW (ref 39.0–52.0)
Hemoglobin: 8.5 g/dL — ABNORMAL LOW (ref 13.0–17.0)
MCH: 29.6 pg (ref 26.0–34.0)
MCHC: 32 g/dL (ref 30.0–36.0)
MCV: 92.7 fL (ref 80.0–100.0)
Platelets: 365 10*3/uL (ref 150–400)
RBC: 2.87 MIL/uL — ABNORMAL LOW (ref 4.22–5.81)
RDW: 14.3 % (ref 11.5–15.5)
WBC: 11.1 10*3/uL — ABNORMAL HIGH (ref 4.0–10.5)
nRBC: 0 % (ref 0.0–0.2)

## 2023-10-13 LAB — IRON AND TIBC
Iron: 60 ug/dL (ref 45–182)
Saturation Ratios: 36 % (ref 17.9–39.5)
TIBC: 165 ug/dL — ABNORMAL LOW (ref 250–450)
UIBC: 105 ug/dL

## 2023-10-13 LAB — VITAMIN B12: Vitamin B-12: 1843 pg/mL — ABNORMAL HIGH (ref 180–914)

## 2023-10-13 LAB — FERRITIN: Ferritin: 205 ng/mL (ref 24–336)

## 2023-10-13 LAB — BASIC METABOLIC PANEL
Anion gap: 13 (ref 5–15)
BUN: 48 mg/dL — ABNORMAL HIGH (ref 8–23)
CO2: 28 mmol/L (ref 22–32)
Calcium: 8.3 mg/dL — ABNORMAL LOW (ref 8.9–10.3)
Chloride: 102 mmol/L (ref 98–111)
Creatinine, Ser: 2.33 mg/dL — ABNORMAL HIGH (ref 0.61–1.24)
GFR, Estimated: 28 mL/min — ABNORMAL LOW (ref >60)
Glucose, Bld: 85 mg/dL (ref 70–99)
Potassium: 2.8 mmol/L — ABNORMAL LOW (ref 3.5–5.1)
Sodium: 143 mmol/L (ref 135–145)

## 2023-10-13 LAB — FOLATE: Folate: 31.1 ng/mL (ref >5.9)

## 2023-10-13 MED ORDER — POTASSIUM CHLORIDE 20 MEQ PO PACK
40.0000 meq | PACK | ORAL | Status: AC
Start: 2023-10-13 — End: 2023-10-13
  Administered 2023-10-13 (×2): 40 meq via ORAL
  Filled 2023-10-13 (×2): qty 2

## 2023-10-13 MED ORDER — METOPROLOL SUCCINATE ER 50 MG PO TB24
50.0000 mg | ORAL_TABLET | Freq: Every day | ORAL | Status: DC
  Administered 2023-10-13 – 2023-10-31 (×18): 50 mg via ORAL
  Filled 2023-10-13 (×19): qty 1

## 2023-10-13 MED ORDER — METOPROLOL SUCCINATE ER 25 MG PO TB24: 37.5000 mg | ORAL_TABLET | Freq: Every day | ORAL | Status: DC

## 2023-10-13 NOTE — Progress Notes (Addendum)
Mobility Specialist Progress Note:   10/13/23 1459  Mobility  Activity Transferred from chair to bed  Level of Assistance Maximum assist, patient does 25-49% (+3)  Assistive Device MaxiMove  LLE Weight Bearing WBAT  Activity Response Tolerated well  $Mobility charge 1 Mobility  Mobility Specialist Start Time (ACUTE ONLY) 1420  Mobility Specialist Stop Time (ACUTE ONLY) 1440  Mobility Specialist Time Calculation (min) (ACUTE ONLY) 20 min   Pt received in chair requesting to return back to bed. No complaints stated during transition, asx throughout. Pt left in bed with nursing staff still present in room.   Leory Plowman  Mobility Specialist Please contact via [employer] office at [phone]

## 2023-10-13 NOTE — Progress Notes (Signed)
Nutrition Follow-up  DOCUMENTATION CODES:   Non-severe (moderate) malnutrition in context of chronic illness  INTERVENTION:   Discussed nutrition poc with Dr. Lowell Guitar, the pt and his wife. Plan to advance diet to [facility] today and monitor tolerance. Dr. Lowell Guitar also allowing pt to consume canned fruit cups (peaches, pears) brought in from home while on FL.   If pt does not tolerate diet advancement and/or pt continues to take in minimal intake, recommend initiation of TPN. Discussed with Dr. Lowell Guitar who agrees with plan for possible TPN   Ensure Enlive po TID, each supplement provides 350 kcal and 20 grams of protein. Ok for pt to drink protein shakes brought in from home as well.   Magic Cup TID with meals, each supplement provides 290 kcal and 9 grams of protein   NUTRITION DIAGNOSIS:   Moderate Malnutrition related to chronic illness (heart failure, afib) as evidenced by mild fat depletion, mild muscle depletion.  Continues but being addressed via TF  GOAL:   Patient will meet greater than or equal to 90% of their needs  Progressing  MONITOR:   PO intake, Labs, Weight trends, Skin  REASON FOR ASSESSMENT:   Consult Assessment of nutrition requirement/status  ASSESSMENT:   Pt admitted with L femoral condyle avulsion fracture, T12 anterior wedge compression fracture sustained while he was changing a tire on his car and it fell on his leg. PMH significant for heart failure, afib, CAD, paroxysmal afib, HTN, CKD and BPH.  CT abdomen indicating adynamic ileus without obstruction  Pt alert on visit today, reports he feels better today than yesterday. Wife at bedside.   Pt reports no nausea today but has not had anything to eat or drink yet. Pt reports nausea x 1 yesterday AM that was treated with IV meds. Pt reoprts he drank a lot of water yesterday, reports he is very thirsty. Pt also may have eaten a popsicle or jello.   +flatus, +BM.   Noted wt 88 kg today, 86 kg yesterday.  Highest wt this admission round 102 kg. Lowest wt around 85 kg  Labs: potassium 2.8 (L), Creatinine 2.33, magnesium 1.8 (wdl), phosphorus 6.0 (H) Meds: B-12, senna-docusate, rena-vite, Vit D 3   Diet Order:   Diet Order             Diet full liquid Room service appropriate? Yes; Fluid consistency: Thin  Diet effective now                   EDUCATION NEEDS:   Education needs have been addressed  Skin:  Skin Assessment: Reviewed RN Assessment Skin Integrity Issues:: Other (Comment) Other: +2 edema perineal, LLE + RLE non pitting  Last BM:  11/22  Height:   Ht Readings from Last 1 Encounters:  09/19/23 5\' 10"  (1.778 m)    Weight:   Wt Readings from Last 1 Encounters:  10/13/23 88 kg    Ideal Body Weight:  75.5 kg  BMI:  Body mass index is 27.84 kg/m.  Estimated Nutritional Needs:   Kcal:  1900-2100  Protein:  95-110g  Fluid:  >/=1.9L   Romelle Starcher MS, RDN, LDN, CNSC Registered Dietitian 3 Clinical Nutrition RD Pager and On-Call Pager Number Located in [place]

## 2023-10-13 NOTE — Plan of Care (Signed)
  Problem: Education: Goal: Knowledge of General Education information will improve Description: Including pain rating scale, medication(s)/side effects and non-pharmacologic comfort measures Outcome: Progressing   Problem: Health Behavior/Discharge Planning: Goal: Ability to manage health-related needs will improve Outcome: Progressing   Problem: Clinical Measurements: Goal: Ability to maintain clinical measurements within normal limits will improve Outcome: Progressing   Problem: Clinical Measurements: Goal: Will remain free from infection Outcome: Progressing   Problem: Nutrition: Goal: Adequate nutrition will be maintained Outcome: Progressing   Problem: Elimination: Goal: Will not experience complications related to bowel motility Outcome: Progressing

## 2023-10-13 NOTE — Progress Notes (Signed)
Patient had 3 episodes of loose bowel movement(small), he was incontinent, has been drinking more fluids and fruits cup (peaches), and some puddings. No any complains of pain, call bell in reach.
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
## 2023-10-14 ENCOUNTER — Inpatient Hospital Stay (HOSPITAL_COMMUNITY): Payer: Medicare PPO

## 2023-10-14 DIAGNOSIS — N1832 Chronic kidney disease, stage 3b: Secondary | ICD-10-CM | POA: Diagnosis not present

## 2023-10-14 DIAGNOSIS — N179 Acute kidney failure, unspecified: Secondary | ICD-10-CM | POA: Diagnosis not present

## 2023-10-14 LAB — BASIC METABOLIC PANEL
Anion gap: 8 (ref 5–15)
BUN: 37 mg/dL — ABNORMAL HIGH (ref 8–23)
CO2: 30 mmol/L (ref 22–32)
Calcium: 8.3 mg/dL — ABNORMAL LOW (ref 8.9–10.3)
Chloride: 104 mmol/L (ref 98–111)
Creatinine, Ser: 2.04 mg/dL — ABNORMAL HIGH (ref 0.61–1.24)
GFR, Estimated: 33 mL/min — ABNORMAL LOW (ref >60)
Glucose, Bld: 111 mg/dL — ABNORMAL HIGH (ref 70–99)
Potassium: 3.5 mmol/L (ref 3.5–5.1)
Sodium: 142 mmol/L (ref 135–145)

## 2023-10-14 LAB — CBC
HCT: 29.1 % — ABNORMAL LOW (ref 39.0–52.0)
Hemoglobin: 9.1 g/dL — ABNORMAL LOW (ref 13.0–17.0)
MCH: 28.7 pg (ref 26.0–34.0)
MCHC: 31.3 g/dL (ref 30.0–36.0)
MCV: 91.8 fL (ref 80.0–100.0)
Platelets: 343 10*3/uL (ref 150–400)
RBC: 3.17 MIL/uL — ABNORMAL LOW (ref 4.22–5.81)
RDW: 14 % (ref 11.5–15.5)
WBC: 11.2 10*3/uL — ABNORMAL HIGH (ref 4.0–10.5)
nRBC: 0 % (ref 0.0–0.2)

## 2023-10-14 LAB — ALBUMIN: Albumin: 2.1 g/dL — ABNORMAL LOW (ref 3.5–5.0)

## 2023-10-14 LAB — PHOSPHORUS: Phosphorus: 3.3 mg/dL (ref 2.5–4.6)

## 2023-10-14 LAB — MAGNESIUM: Magnesium: 1.7 mg/dL (ref 1.7–2.4)

## 2023-10-14 MED ORDER — MAGNESIUM SULFATE IN D5W 1-5 GM/100ML-% IV SOLN
1.0000 g | Freq: Once | INTRAVENOUS | Status: AC
  Administered 2023-10-14: 1 g via INTRAVENOUS
  Filled 2023-10-14: qty 100

## 2023-10-14 MED ORDER — POTASSIUM CHLORIDE CRYS ER 20 MEQ PO TBCR
40.0000 meq | EXTENDED_RELEASE_TABLET | ORAL | Status: AC
  Administered 2023-10-14 (×2): 40 meq via ORAL
  Filled 2023-10-14 (×2): qty 2

## 2023-10-14 NOTE — Progress Notes (Signed)
Tried to wean patient to RA, saturation dropped to 86% on RA so started oxygen at 1 ltrs/min via [place]. Patient didn't mention about difficulty in breathing.

## 2023-10-14 NOTE — Progress Notes (Signed)
Jorge West  RUE:[id number] DOB: Nov 04, 1946 DOA: 09/19/2023 PCP: Elizabeth Palau, FNP  Chief Complaint  Patient presents with   Trauma   Level 1   Crush Injury    Brief Narrative:   77 year old with history of CHF, [name], CAD, HTN, CKD, BPH presenting with left lower extremity trauma Anteroapical and then falling on the floor. Apparently was trapped for about 2 hours until family found him. Upon admission CT head, cervical spine negative, CT chest abdomen pelvis showed acute T12 compression fracture, CT of lower extremity showed asymmetric enlargement of muscle on the left.  Patient was admitted for left femoral condyle avulsion fracture, severe rhabdomyolysis and AKI on CKD. Patient started on dialysis.  Nephrology is following closely.  Patient had fever episode along with right upper quadrant abdominal pain, [place] 11/12-mild wall thickening gallbladder but no gallstone, placed on antibiotics HIDA scan obtained 11/13> was positive for acute cholecystitis-CCS was consulted, given his comorbidities felt to be high risk for surgical intervention IR consulted and cholecystostomy tube placed 11/13.  Overall or clinically he is afebrile WBC count has normalized, hemoglobin drifted PRN 1 unit PRBC. He remains weak and deconditioned.   Assessment & Plan:   Principal Problem:   Acute kidney injury superimposed on stage 3b chronic kidney disease (HCC) Active Problems:   Atrial fibrillation with RVR (HCC)   Essential hypertension   Closed left femoral condyle avulsion fracture (HCC)   Coronary artery disease   Anemia of chronic disease   Malnutrition of moderate degree   Persistent atrial fibrillation (HCC)  Oliguric AKI on CKD 3B  2/2 rhabdomyolysis and contrast nephrotoxicity:  Patient needed HD on 11/2, subsequently discontinued and HD cath removed 11/12.   Renal function improving, nephro signed off.  Continue to encourage oral intake as it is poor.  Renal function  not yet to baseline (baseline creatinine 1.5-1.8 - peaked during this admission at 7.23) Has settled in the 2's -> will monitor  Traumatic rhabdomyolysis: CK has improved overall continue oral hydration   Acute cholecystitis s/p cholecystostomy tube 11/13-fluid cx Citrobacter: HIDA 11/13  positive for acute cholecystitis CCS was consulted, given his comorbidities felt to be high risk for surgical intervention  s/p IR cholecystostomy tube placed 11/13 culture with Citrobacter,blood culture 11/2 NGTD.  Continue Unasyn treatment duration 10 days post drain placement-per iD. Pharmacy.   Ileus  CT with diffuse colonic distension with multiple gas fluid levels c/w adynamic colonic ileus, gastric distension Full liquids for now, will advance as tolerated - did ok with this, but didn't take much, will follow today Follow KUB  Poor PO intake Moderate Malnutrition May need to consider TPN while he's recovering from ileus/above   Enterococcus faecalis UTI: On antibiotics as above   Acute hypoxic respiratory failure: Multifactorial in the setting of CHF/volume overload, acute cholecystitis and abdominal pain, continue supplement oxygen, encourage I-S PT OT, ambulate Oob, IS.   Acute on chronic diastolic CHF Volume Overload Hypoalbuminemia Appears overloaded on exam (CT yesterday showed small bilateral effusions, trace ascites, and body wall edema) Echo 08/2023 with EF 40-45% Continue metoprolol Hold entresto due to AKI Hold diuresis for now - net negative 9.3 L, will allow to autodiurese, consider adding lasix when renal function further improved  Scrotal edema Elevate.   Permanent Prithvi Kooi Fib with RVR Prolonged Qtc Essential hypertension:: BP initially soft/hypotensive limiting titration of Toprol-XL, was placed on midodrine now BP stable and trending up-we will stop midodrine 11/20 and monitor BP. Continue  Toprol (increased to 50 mg -> may need additional increase, will monitor) and  amiodarone and adjust Toprol if needed for HR.  Will repeat EKG for qtc eval -> improved 11/22  Communited Minimally Displaced Tibial Plateau Fracture Nondisplaced Fracture of the Fibular Head Mildly Displaced Cortical Avulsion Fracture at the MCL attachment site of the medial femoral condyle Lipohemarthrosis Per ortho note 10/30, WBAT with ROM knee brace.  Dr. Aundria Rud to follow, may need reconstructive surgery down the road.     CAD w/ PCI placed 2 months ago: No chest pain. Cont home Plavix.  Hypokalemia: Replace and follow  Add on mag  Anemia of chronic disease ABLA: S/p 1 unit pRBC 11/16 (2 units FFP were given on 10/29) Labs c/w aocd.  Normal B12, folate.   . Obesity Body mass index is 27.84 kg/m.      DVT prophylaxis: eliquis Code Status: full Family Communication: wife Disposition:   Status is: Inpatient Remains inpatient appropriate because: pending improvement   Consultants:  surgery  Procedures:  11/13 IMPRESSION: Status post percutaneous cholecystostomy  11/1 IMPRESSION: Status post right IJ temporary hemodialysis catheter  Antimicrobials:  Anti-infectives (From admission, onward)    Start     Dose/Rate Route Frequency Ordered Stop   10/11/23 1600  Ampicillin-Sulbactam (UNASYN) 3 g in sodium chloride 0.9 % 100 mL IVPB        3 g 200 mL/hr over 30 Minutes Intravenous Every 6 hours 10/11/23 1211 10/15/23 0359   10/07/23 2200  Ampicillin-Sulbactam (UNASYN) 3 g in sodium chloride 0.9 % 100 mL IVPB  Status:  Discontinued        3 g 200 mL/hr over 30 Minutes Intravenous Every 12 hours 10/07/23 1215 10/11/23 1211   10/05/23 0000  cefOXitin (MEFOXIN) 2 g in sodium chloride 0.9 % 100 mL IVPB  Status:  Discontinued        2 g 200 mL/hr over 30 Minutes Intravenous To Radiology 10/04/23 1450 10/04/23 1455   10/04/23 1545  cefOXitin (MEFOXIN) 2 g in sodium chloride 0.9 % 100 mL IVPB  Status:  Discontinued        2 g 200 mL/hr over 30 Minutes Intravenous To  Radiology 10/04/23 1455 10/04/23 1718   10/03/23 0600  cefTRIAXone (ROCEPHIN) 2 g in sodium chloride 0.9 % 100 mL IVPB  Status:  Discontinued       Note to Pharmacy: Start after blood cultures drawn   2 g 200 mL/hr over 30 Minutes Intravenous Every 24 hours 10/03/23 0514 10/07/23 1215   10/03/23 0600  metroNIDAZOLE (FLAGYL) tablet 500 mg  Status:  Discontinued        500 mg Oral Every 12 hours 10/03/23 0514 10/05/23 0837       Subjective: Wife at bedside Nausea with meds last night Only took some fruit cups and 1 ensure yesterday - not Gianah Batt whole lot overall, but didn't have any adverse effects with this (nausea/pain)  Objective: Vitals:   10/14/23 0500 10/14/23 0740 10/14/23 0900 10/14/23 0925  BP:  (!) 165/99 (!) 142/106 (!) 143/95  Pulse: (!) 108 (!) 116 (!) 112 90  Resp:      Temp:  (!) 97.3 F (36.3 C)    TempSrc:  Oral    SpO2:  97%    Weight:      Height:        Intake/Output Summary (Last 24 hours) at 10/14/2023 0928 Last data filed at 10/14/2023 0905 Gross per 24 hour  Intake 560 ml  Output  550 ml  Net 10 ml   Filed Weights   10/10/23 0335 10/12/23 0629 10/13/23 0537  Weight: 98 kg 86 kg 88 kg    Examination:  General: No acute distress. Cardiovascular: irregularly irregular, tachy Lungs: unlabored Abdomen: distended, perc chole drain Neurological: Alert and oriented 3. Moves all extremities 4 with equal strength. Cranial nerves II through XII grossly intact.  Extremities: L knee brace    Data Reviewed: I have personally reviewed following labs and imaging studies  CBC: Recent Labs  Lab 10/10/23 0541 10/11/23 0437 10/12/23 0405 10/13/23 0412 10/14/23 0832  WBC 8.7 12.2* 12.5* 11.1* 11.2*  HGB 8.5* 9.3* 8.9* 8.5* 9.1*  HCT 26.7* 29.5* 28.8* 26.6* 29.1*  MCV 91.4 91.6 91.7 92.7 91.8  PLT 389 478* 216 365 343    Basic Metabolic Panel: Recent Labs  Lab 10/09/23 0539 10/10/23 0541 10/11/23 0437 10/12/23 0405 10/13/23 0412 10/13/23 1100   NA 142 141 145 142 143  --   K 3.5 3.2* 3.5 3.5 2.8*  --   CL 105 104 105 103 102  --   CO2 27 27 30 28 28   --   GLUCOSE 94 116* 129* 105* 85  --   BUN 69* 62* 45* 53* 48*  --   CREATININE 3.12* 2.59* 2.19* 2.38* 2.33*  --   CALCIUM 8.6* 8.7* 8.8* 8.4* 8.3*  --   MG  --   --   --   --   --  1.8    GFR: Estimated Creatinine Clearance: 29.7 mL/min (Evelyn Moch) (by C-G formula based on SCr of 2.33 mg/dL (H)).  Liver Function Tests: No results for input(s): "AST", "ALT", "ALKPHOS", "BILITOT", "PROT", "ALBUMIN" in the last 168 hours.   CBG: No results for input(s): "GLUCAP" in the last 168 hours.    Recent Results (from the past 240 hour(s))  Aerobic/Anaerobic Culture w Gram Stain (surgical/deep wound)     Status: None   Collection Time: 10/04/23  4:10 PM   Specimen: Gallbladder; Bile  Result Value Ref Range Status   Specimen Description GALL BLADDER  Final   Special Requests NONE  Final   Gram Stain   Final    RARE WBC PRESENT, PREDOMINANTLY PMN FEW GRAM NEGATIVE RODS    Culture   Final    MODERATE CITROBACTER KOSERI NO ANAEROBES ISOLATED Performed at [facility], [address] [place], [place] [address]    Report Status 10/09/2023 FINAL  Final   Organism ID, Bacteria CITROBACTER KOSERI  Final      Susceptibility   Citrobacter koseri - MIC*    CEFEPIME <=0.12 SENSITIVE Sensitive     CEFTAZIDIME <=1 SENSITIVE Sensitive     CEFTRIAXONE <=0.25 SENSITIVE Sensitive     CIPROFLOXACIN <=0.25 SENSITIVE Sensitive     GENTAMICIN <=1 SENSITIVE Sensitive     IMIPENEM <=0.25 SENSITIVE Sensitive     TRIMETH/SULFA <=20 SENSITIVE Sensitive     PIP/TAZO <=4 SENSITIVE Sensitive ug/mL    * MODERATE CITROBACTER KOSERI         Radiology Studies: No results found.      Scheduled Meds:  amiodarone  200 mg Oral Daily   apixaban  5 mg Oral BID   brimonidine  1 drop Left Eye Daily   And   timolol  1 drop Left Eye Daily   buPROPion  300 mg Oral Daily   cholecalciferol   1,000 Units Oral Daily   clopidogrel  75 mg Oral Daily   docusate sodium  100 mg Oral Daily   escitalopram  10 mg Oral Daily   feeding supplement  237 mL Oral TID BM   metoprolol succinate  50 mg Oral Daily   multivitamin  1 tablet Oral QHS   polyethylene glycol  17 g Oral Daily   senna-docusate  1 tablet Oral QHS   sodium chloride flush  3 mL Intravenous Q12H   sodium chloride flush  5 mL Intracatheter Q8H   cyanocobalamin  100 mcg Oral Daily   Continuous Infusions:  ampicillin-sulbactam (UNASYN) IV 3 g (10/14/23 0905)     LOS: 24 days    Time spent: over 30 min     Lacretia Nicks, MD [facility]   To contact the attending provider between 7A-7P or the covering provider during after hours 7P-7A, please log into the web site [email or web address] and access using universal [place] password for that web site. If you do not have the password, please call the hospital operator.  10/14/2023, 9:28 AM

## 2023-10-14 NOTE — Progress Notes (Signed)
Patient had 4 episodes of type 6 and 7 stools (large), refused to drink ensure protein supplement. Didn't complain of stomach pain, abdomen is distended, passing flatus.

## 2023-10-14 NOTE — Plan of Care (Signed)

## 2023-10-15 DIAGNOSIS — N179 Acute kidney failure, unspecified: Secondary | ICD-10-CM | POA: Diagnosis not present

## 2023-10-15 DIAGNOSIS — N1832 Chronic kidney disease, stage 3b: Secondary | ICD-10-CM | POA: Diagnosis not present

## 2023-10-15 LAB — BASIC METABOLIC PANEL
Anion gap: 10 (ref 5–15)
BUN: 30 mg/dL — ABNORMAL HIGH (ref 8–23)
CO2: 31 mmol/L (ref 22–32)
Calcium: 8.2 mg/dL — ABNORMAL LOW (ref 8.9–10.3)
Chloride: 104 mmol/L (ref 98–111)
Creatinine, Ser: 1.84 mg/dL — ABNORMAL HIGH (ref 0.61–1.24)
GFR, Estimated: 37 mL/min — ABNORMAL LOW (ref >60)
Glucose, Bld: 105 mg/dL — ABNORMAL HIGH (ref 70–99)
Potassium: 3.6 mmol/L (ref 3.5–5.1)
Sodium: 145 mmol/L (ref 135–145)

## 2023-10-15 LAB — CBC
HCT: 28.4 % — ABNORMAL LOW (ref 39.0–52.0)
Hemoglobin: 9.1 g/dL — ABNORMAL LOW (ref 13.0–17.0)
MCH: 29.6 pg (ref 26.0–34.0)
MCHC: 32 g/dL (ref 30.0–36.0)
MCV: 92.5 fL (ref 80.0–100.0)
Platelets: 310 10*3/uL (ref 150–400)
RBC: 3.07 MIL/uL — ABNORMAL LOW (ref 4.22–5.81)
RDW: 14.1 % (ref 11.5–15.5)
WBC: 10 10*3/uL (ref 4.0–10.5)
nRBC: 0 % (ref 0.0–0.2)

## 2023-10-15 LAB — MAGNESIUM: Magnesium: 1.9 mg/dL (ref 1.7–2.4)

## 2023-10-15 NOTE — Progress Notes (Incomplete)
PHARMACY - TOTAL PARENTERAL NUTRITION CONSULT NOTE   Indication: Prolonged ileus  Patient Measurements: Height: 5\' 10"  (177.8 cm) Weight: 91 kg (200 lb 9.9 oz) IBW/kg (Calculated) : 73 TPN AdjBW (KG): 85.3 Body mass index is 28.79 kg/m. Usual Weight: 88kg  Assessment:  [name] with prolonged admission s/p trauma. Pt had AKI and rhabdomyolysis which progressed to HD requirement. Now with some renal recovery, HD has been stopped. Pt underwent IR placement of cholecystostomy tube on 11/13.Pt now with adynamic ileus as demonstrated on imaging with poor PO intake. Pharmacy has been consulted for TPN to assist with meeting nutritional goals in setting of ileus.   Glucose / Insulin: CBG<180. No hx of DM Electrolytes:  Renal: Scr 1.84, BUN 30s Hepatic:  Intake / Output; MIVF: UO 0.3 ml/kg/hr, LBM 11/23 x5 occurrences. Net -1.9L GI Imaging: 11/23 slight decrease in gaseous distention in stomach, bowel. Unchanged in colon.  GI Surgeries / Procedures:  11/13 cholecystostomy tube placed   Central access: pending placement of central line 11/25 TPN start date: 11/25 PM  Nutritional Goals: Goal TPN rate is:  Clinimix 8/10 1L on x5 days and Clinimix 8/10 2L x2 days Plus SMOF Lipids at 20.28ml/hr every day  Electrolytes in 8/10 E 1L bag: Na 35 mEq, K 30 mEq, Mag 5 mEq, Ca 5 mEq, Phos , Acetate 83, Cl 76 mEq Electrolytes in Clinimix E 2L bag: Na , K , Mag , Ca , Phos , Ac , Cl .   Average kcal per day from all sources of parenteral nutrition: 1353 kcal, average protein per day: 103g  RD Assessment: Estimated Needs Total Energy Estimated Needs: 1900-2100 Total Protein Estimated Needs: 95-110g Total Fluid Estimated Needs: >/=1.9L  Current Nutrition:  Full liquids + TPN (11/25)  Plan:    Add standard MVI and trace elements to TPN Initiate Sensitive q4h SSI and adjust as needed  Monitor TPN labs on Mon/Thurs, RFP daily until  stable   Calton Dach, PharmD, BCCCP Clinical Pharmacist 10/15/2023 12:11 PM

## 2023-10-15 NOTE — Progress Notes (Signed)
PROGRESS NOTE    Jorge West  WUJ:[id number] DOB: Feb 03, 1946 DOA: 09/19/2023 PCP: Elizabeth Palau, FNP  Chief Complaint  Patient presents with   Trauma   Level 1   Crush Injury    Brief Narrative:   77 year old with history of CHF, Acadia Thamm[place]ong-fib, CAD, HTN, CKD, BPH presenting with left lower extremity trauma Anteroapical and then falling on the floor. Apparently was trapped for about 2 hours until family found him. Upon admission CT head, cervical spine negative, CT chest abdomen pelvis showed acute T12 compression fracture, CT of lower extremity showed asymmetric enlargement of muscle on the left.  Patient was admitted for left femoral condyle avulsion fracture, severe rhabdomyolysis and AKI on CKD. Patient started on dialysis.  Nephrology is following closely.  Patient had fever episode along with right upper quadrant abdominal pain, [place] 11/12-mild wall thickening gallbladder but no gallstone, placed on antibiotics HIDA scan obtained 11/13> was positive for acute cholecystitis-CCS was consulted, given his comorbidities felt to be high risk for surgical intervention IR consulted and cholecystostomy tube placed 11/13.  Overall or clinically he is afebrile WBC count has normalized, hemoglobin drifted PRN 1 unit PRBC. He remains weak and deconditioned.   Assessment & Plan:   Principal Problem:   Acute kidney injury superimposed on stage 3b chronic kidney disease (HCC) Active Problems:   Atrial fibrillation with RVR (HCC)   Essential hypertension   Closed left femoral condyle avulsion fracture (HCC)   Coronary artery disease   Anemia of chronic disease   Malnutrition of moderate degree   Persistent atrial fibrillation (HCC)  Ileus  CT with diffuse colonic distension with multiple gas fluid levels c/w adynamic colonic ileus, gastric distension KUB with interval decrease in gaseous distension of stomach and small bowel, persistent gaseous distension of the colon  Exam mildly improved  today, slightly less distension  Plan for TPN as he's unable to get adequate nutrition, needs central line given his CKD  Poor PO intake Moderate Malnutrition TPN as above  Acute cholecystitis s/p cholecystostomy tube 11/13-fluid cx Citrobacter: HIDA 11/13  positive for acute cholecystitis CCS was consulted, given his comorbidities felt to be high risk for surgical intervention  s/p IR cholecystostomy tube placed 11/13 culture with Citrobacter,blood culture 11/2 NGTD.  Continue Unasyn treatment duration 10 days post drain placement-per iD. Pharmacy.   Oliguric AKI on CKD 3B  2/2 rhabdomyolysis and contrast nephrotoxicity:  Patient needed HD on 11/2, subsequently discontinued and HD cath removed 11/12.   Renal function improving, nephro signed off.  Continue to encourage oral intake as it is poor.  Renal function not yet to baseline (baseline creatinine 1.5-1.8 - peaked during this admission at 7.23) Has settled in the 2's -> will monitor - awaiting labs today  Traumatic rhabdomyolysis: CK has improved overall continue oral hydration   Enterococcus faecalis UTI: On antibiotics as above   Acute hypoxic respiratory failure: Multifactorial in the setting of CHF/volume overload, acute cholecystitis and abdominal pain, continue supplement oxygen, encourage I-S PT OT, ambulate Oob, IS.   Acute on chronic diastolic CHF Volume Overload Hypoalbuminemia Appears overloaded on exam (CT yesterday showed small bilateral effusions, trace ascites, and body wall edema) Echo 08/2023 with EF 40-45% Continue metoprolol Hold entresto due to AKI Hold diuresis for now - net negative 7.7 L, will allow to autodiurese, consider adding lasix when renal function further improved  Scrotal edema Elevate.   Permanent Mena Lienau Fib with RVR Prolonged Qtc Essential hypertension:: BP initially soft/hypotensive limiting titration of Toprol-XL, was  placed on midodrine now BP stable and trending up-we will stop  midodrine 11/20 and monitor BP. Continue Toprol (increased to 50 mg -> may need additional increase, will monitor) and amiodarone and adjust Toprol if needed for HR.  Will repeat EKG for qtc eval -> improved 11/22  Communited Minimally Displaced Tibial Plateau Fracture Nondisplaced Fracture of the Fibular Head Mildly Displaced Cortical Avulsion Fracture at the MCL attachment site of the medial femoral condyle Lipohemarthrosis Per ortho note 10/30, WBAT with ROM knee brace.  Dr. Aundria Rud to follow, may need reconstructive surgery down the road.     CAD w/ PCI placed 2 months ago: No chest pain. Cont home Plavix.  Hypokalemia: Replace and follow  Add on mag  Anemia of chronic disease ABLA: S/p 1 unit pRBC 11/16 (2 units FFP were given on 10/29) Labs c/w aocd.  Normal B12, folate.   . Obesity Body mass index is 28.79 kg/m.      DVT prophylaxis: eliquis Code Status: full Family Communication: wife Disposition:   Status is: Inpatient Remains inpatient appropriate because: pending improvement   Consultants:  surgery  Procedures:  11/13 IMPRESSION: Status post percutaneous cholecystostomy  11/1 IMPRESSION: Status post right IJ temporary hemodialysis catheter  Antimicrobials:  Anti-infectives (From admission, onward)    Start     Dose/Rate Route Frequency Ordered Stop   10/11/23 1600  Ampicillin-Sulbactam (UNASYN) 3 g in sodium chloride 0.9 % 100 mL IVPB        3 g 200 mL/hr over 30 Minutes Intravenous Every 6 hours 10/11/23 1211 10/14/23 2141   10/07/23 2200  Ampicillin-Sulbactam (UNASYN) 3 g in sodium chloride 0.9 % 100 mL IVPB  Status:  Discontinued        3 g 200 mL/hr over 30 Minutes Intravenous Every 12 hours 10/07/23 1215 10/11/23 1211   10/05/23 0000  cefOXitin (MEFOXIN) 2 g in sodium chloride 0.9 % 100 mL IVPB  Status:  Discontinued        2 g 200 mL/hr over 30 Minutes Intravenous To Radiology 10/04/23 1450 10/04/23 1455   10/04/23 1545  cefOXitin  (MEFOXIN) 2 g in sodium chloride 0.9 % 100 mL IVPB  Status:  Discontinued        2 g 200 mL/hr over 30 Minutes Intravenous To Radiology 10/04/23 1455 10/04/23 1718   10/03/23 0600  cefTRIAXone (ROCEPHIN) 2 g in sodium chloride 0.9 % 100 mL IVPB  Status:  Discontinued       Note to Pharmacy: Start after blood cultures drawn   2 g 200 mL/hr over 30 Minutes Intravenous Every 24 hours 10/03/23 0514 10/07/23 1215   10/03/23 0600  metroNIDAZOLE (FLAGYL) tablet 500 mg  Status:  Discontinued        500 mg Oral Every 12 hours 10/03/23 0514 10/05/23 0837       Subjective: Wife at bedside Just had 1 cup of fruit and half protein supplement yesterday Notes early fullness  Objective: Vitals:   10/15/23 0035 10/15/23 0457 10/15/23 0532 10/15/23 0732  BP: (!) 139/95 (!) 149/93  (!) 149/104  Pulse: 96 (!) 106 (!) 105 100  Resp: 16 16    Temp: 97.8 F (36.6 C) (!) 97.5 F (36.4 C)  97.6 F (36.4 C)  TempSrc: Oral Oral  Oral  SpO2: 95% 97% 95% 97%  Weight:   91 kg   Height:        Intake/Output Summary (Last 24 hours) at 10/15/2023 0901 Last data filed at 10/15/2023 0700 Gross per  24 hour  Intake 1464.93 ml  Output 801 ml  Net 663.93 ml   Filed Weights   10/12/23 0629 10/13/23 0537 10/15/23 0532  Weight: 86 kg 88 kg 91 kg    Examination:  General: No acute distress. Cardiovascular: irregularly irregular, mildly tachy Lungs: unlabored Abdomen: distension, mildly improved today, nontender - perc chole tube Neurological: Alert and oriented 3. Moves all extremities 4 with equal strength. Cranial nerves II through XII grossly intact. Extremities: bilateral LE edema, 1+   Extremities: L knee brace    Data Reviewed: I have personally reviewed following labs and imaging studies  CBC: Recent Labs  Lab 10/11/23 0437 10/12/23 0405 10/13/23 0412 10/14/23 0832 10/15/23 0834  WBC 12.2* 12.5* 11.1* 11.2* 10.0  HGB 9.3* 8.9* 8.5* 9.1* 9.1*  HCT 29.5* 28.8* 26.6* 29.1* 28.4*   MCV 91.6 91.7 92.7 91.8 92.5  PLT 478* 216 365 343 310    Basic Metabolic Panel: Recent Labs  Lab 10/10/23 0541 10/11/23 0437 10/12/23 0405 10/13/23 0412 10/13/23 1100 10/14/23 0832  NA 141 145 142 143  --  142  K 3.2* 3.5 3.5 2.8*  --  3.5  CL 104 105 103 102  --  104  CO2 27 30 28 28   --  30  GLUCOSE 116* 129* 105* 85  --  111*  BUN 62* 45* 53* 48*  --  37*  CREATININE 2.59* 2.19* 2.38* 2.33*  --  2.04*  CALCIUM 8.7* 8.8* 8.4* 8.3*  --  8.3*  MG  --   --   --   --  1.8 1.7  PHOS  --   --   --   --   --  3.3    GFR: Estimated Creatinine Clearance: 34.4 mL/min (Gianny Killman) (by C-G formula based on SCr of 2.04 mg/dL (H)).  Liver Function Tests: Recent Labs  Lab 10/14/23 0832  ALBUMIN 2.1*     CBG: No results for input(s): "GLUCAP" in the last 168 hours.    No results found for this or any previous visit (from the past 240 hour(s)).        Radiology Studies: DG Abd 1 View  Result Date: 10/14/2023 CLINICAL DATA:  Ileus EXAM: ABDOMEN - 1 VIEW COMPARISON:  10/11/2023 FINDINGS: Mild gaseous distention of the stomach evident, decreased in the interval. Gaseous distension of colon is similar to prior. No small bowel dilatation with interval slight decrease in volume of small bowel gas. Cholecystostomy tube again noted over the right upper quadrant. Bones are diffusely demineralized. IMPRESSION: 1. Interval slight decrease in gaseous distention of the stomach and small bowel. 2. Persistent gaseous distension of colon, similar to minimally decreased in the interval. Electronically Signed   By: Kennith Center M.D.   On: 10/14/2023 13:51        Scheduled Meds:  amiodarone  200 mg Oral Daily   apixaban  5 mg Oral BID   brimonidine  1 drop Left Eye Daily   And   timolol  1 drop Left Eye Daily   buPROPion  300 mg Oral Daily   cholecalciferol  1,000 Units Oral Daily   clopidogrel  75 mg Oral Daily   docusate sodium  100 mg Oral Daily   escitalopram  10 mg Oral Daily    feeding supplement  237 mL Oral TID BM   metoprolol succinate  50 mg Oral Daily   multivitamin  1 tablet Oral QHS   polyethylene glycol  17 g Oral Daily   senna-docusate  1 tablet Oral QHS   sodium chloride flush  3 mL Intravenous Q12H   sodium chloride flush  5 mL Intracatheter Q8H   cyanocobalamin  100 mcg Oral Daily   Continuous Infusions:     LOS: 25 days    Time spent: over 30 min     Lacretia Nicks, MD [facility]   To contact the attending provider between 7A-7P or the covering provider during after hours 7P-7A, please log into the web site [email or web address] and access using universal  password for that web site. If you do not have the password, please call the hospital operator.  10/15/2023, 9:01 AM

## 2023-10-15 NOTE — TOC Progression Note (Signed)
Transition of Care [facility]) - Progression Note    Patient Details  Name: Jorge West MRN: [id number] Date of Birth: 1946-01-29  Transition of Care [facility]) CM/SW Contact  Eduard Roux, [place] Phone Number: 10/15/2023, 9:12 AM  Clinical Narrative:     TOC continues to follow and will assist with discharge planning.  Antony Blackbird, MSW, LCSW Clinical Social Worker    Expected Discharge Plan: Skilled Nursing Facility Barriers to Discharge: Continued Medical Work up  Expected Discharge Plan and Services                                               Social Determinants of Health (SDOH) Interventions SDOH Screenings   Food Insecurity: No Food Insecurity (09/20/2023)  Housing: Low Risk  (09/20/2023)  Transportation Needs: No Transportation Needs (09/20/2023)  Utilities: Not At Risk (09/20/2023)  Depression (PHQ2-9): Medium Risk (09/05/2023)  Financial Resource Strain: Low Risk  (04/24/2023)   Received from [facility], [facility]  Physical Activity: Inactive (04/24/2023)   Received from [facility], [facility]  Social Connections: Moderately Integrated (04/24/2023)   Received from [facility], [facility]  Stress: No Stress Concern Present (07/18/2023)   Received from [facility]  Tobacco Use: Medium Risk (09/20/2023)    Readmission Risk Interventions     No data to display

## 2023-10-16 ENCOUNTER — Inpatient Hospital Stay (HOSPITAL_COMMUNITY): Payer: Medicare PPO

## 2023-10-16 ENCOUNTER — Encounter (HOSPITAL_COMMUNITY): Payer: Medicare PPO

## 2023-10-16 DIAGNOSIS — N1832 Chronic kidney disease, stage 3b: Secondary | ICD-10-CM | POA: Diagnosis not present

## 2023-10-16 DIAGNOSIS — N179 Acute kidney failure, unspecified: Secondary | ICD-10-CM | POA: Diagnosis not present

## 2023-10-16 HISTORY — PX: IR FLUORO GUIDE CV LINE RIGHT: IMG2283

## 2023-10-16 HISTORY — PX: IR US GUIDE VASC ACCESS RIGHT: IMG2390

## 2023-10-16 LAB — CBC WITH DIFFERENTIAL/PLATELET
Abs Immature Granulocytes: 0.07 10*3/uL (ref 0.00–0.07)
Basophils Absolute: 0.1 10*3/uL (ref 0.0–0.1)
Basophils Relative: 1 %
Eosinophils Absolute: 0.5 10*3/uL (ref 0.0–0.5)
Eosinophils Relative: 5 %
HCT: 28.6 % — ABNORMAL LOW (ref 39.0–52.0)
Hemoglobin: 9.2 g/dL — ABNORMAL LOW (ref 13.0–17.0)
Immature Granulocytes: 1 %
Lymphocytes Relative: 9 %
Lymphs Abs: 0.9 10*3/uL (ref 0.7–4.0)
MCH: 29.6 pg (ref 26.0–34.0)
MCHC: 32.2 g/dL (ref 30.0–36.0)
MCV: 92 fL (ref 80.0–100.0)
Monocytes Absolute: 0.6 10*3/uL (ref 0.1–1.0)
Monocytes Relative: 6 %
Neutro Abs: 8.4 10*3/uL — ABNORMAL HIGH (ref 1.7–7.7)
Neutrophils Relative %: 78 %
Platelets: 294 10*3/uL (ref 150–400)
RBC: 3.11 MIL/uL — ABNORMAL LOW (ref 4.22–5.81)
RDW: 14.3 % (ref 11.5–15.5)
WBC: 10.4 10*3/uL (ref 4.0–10.5)
nRBC: 0 % (ref 0.0–0.2)

## 2023-10-16 LAB — COMPREHENSIVE METABOLIC PANEL
ALT: 16 U/L (ref 0–44)
AST: 21 U/L (ref 15–41)
Albumin: 2.1 g/dL — ABNORMAL LOW (ref 3.5–5.0)
Alkaline Phosphatase: 41 U/L (ref 38–126)
Anion gap: 6 (ref 5–15)
BUN: 24 mg/dL — ABNORMAL HIGH (ref 8–23)
CO2: 31 mmol/L (ref 22–32)
Calcium: 8 mg/dL — ABNORMAL LOW (ref 8.9–10.3)
Chloride: 104 mmol/L (ref 98–111)
Creatinine, Ser: 1.69 mg/dL — ABNORMAL HIGH (ref 0.61–1.24)
GFR, Estimated: 41 mL/min — ABNORMAL LOW (ref >60)
Glucose, Bld: 101 mg/dL — ABNORMAL HIGH (ref 70–99)
Potassium: 3.2 mmol/L — ABNORMAL LOW (ref 3.5–5.1)
Sodium: 141 mmol/L (ref 135–145)
Total Bilirubin: 0.7 mg/dL (ref <1.2)
Total Protein: 5.3 g/dL — ABNORMAL LOW (ref 6.5–8.1)

## 2023-10-16 LAB — MAGNESIUM: Magnesium: 1.8 mg/dL (ref 1.7–2.4)

## 2023-10-16 LAB — PHOSPHORUS: Phosphorus: 2.7 mg/dL (ref 2.5–4.6)

## 2023-10-16 LAB — TRIGLYCERIDES: Triglycerides: 78 mg/dL (ref <150)

## 2023-10-16 MED ORDER — MAGNESIUM SULFATE 2 GM/50ML IV SOLN
2.0000 g | Freq: Once | INTRAVENOUS | Status: AC
Start: 2023-10-16 — End: 2023-10-16
  Administered 2023-10-16: 2 g via INTRAVENOUS
  Filled 2023-10-16: qty 50

## 2023-10-16 MED ORDER — LIDOCAINE-EPINEPHRINE 1 %-1:100000 IJ SOLN
INTRAMUSCULAR | Status: AC
  Filled 2023-10-16: qty 1

## 2023-10-16 MED ORDER — OXIDIZED CELLULOSE EX PADS
1.0000 | MEDICATED_PAD | Freq: Once | CUTANEOUS | Status: AC
  Administered 2023-10-16: 1 via TOPICAL
  Filled 2023-10-16 (×2): qty 1

## 2023-10-16 MED ORDER — FAT EMUL FISH OIL/PLANT BASED 20% (SMOFLIPID)IV EMUL
250.0000 mL | INTRAVENOUS | Status: AC
  Administered 2023-10-16: 250 mL via INTRAVENOUS
  Filled 2023-10-16: qty 250

## 2023-10-16 MED ORDER — OXIDIZED CELLULOSE EX PADS
1.0000 | MEDICATED_PAD | Freq: Once | CUTANEOUS | Status: DC
  Filled 2023-10-16: qty 1

## 2023-10-16 MED ORDER — TRACE MINERALS CU-MN-SE-ZN 300-55-60-3000 MCG/ML IV SOLN
INTRAVENOUS | Status: AC
  Filled 2023-10-16 (×2): qty 1000

## 2023-10-16 MED ORDER — POTASSIUM CHLORIDE 10 MEQ/100ML IV SOLN
10.0000 meq | INTRAVENOUS | Status: AC
  Administered 2023-10-16 (×4): 10 meq via INTRAVENOUS
  Filled 2023-10-16 (×6): qty 100

## 2023-10-16 MED ORDER — PROSOURCE PLUS PO LIQD
30.0000 mL | Freq: Three times a day (TID) | ORAL | Status: DC
  Administered 2023-10-17 – 2023-10-20 (×9): 30 mL via ORAL
  Filled 2023-10-16 (×8): qty 30

## 2023-10-16 MED ORDER — LIDOCAINE-EPINEPHRINE 1 %-1:100000 IJ SOLN
20.0000 mL | Freq: Once | INTRAMUSCULAR | Status: AC
  Administered 2023-10-16: 10 mL via INTRADERMAL

## 2023-10-16 MED ORDER — POTASSIUM CHLORIDE 10 MEQ/100ML IV SOLN
10.0000 meq | INTRAVENOUS | Status: AC
  Administered 2023-10-16 (×2): 10 meq via INTRAVENOUS
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
## 2023-10-16 NOTE — Progress Notes (Signed)
Dressing removed. Biopatch saturated. Site cleaned. New dressing applied and surgicel applied at insertion site. Minimal oozing during dressing change. Advised unit RN that if dressing becomes saturated again, to reach out to MD and potentially have pt return to IR for further evaluation.

## 2023-10-16 NOTE — Procedures (Signed)
Vascular and Interventional Radiology Procedure Note  Patient: Jorge West DOB: 10/29/1946 Medical Record Number: [id number] Note Date/Time: 10/16/23 2:56 PM   Performing Physician: Roanna Banning, MD Assistant(s): None  Diagnosis: TPN access  Procedure: TUNNELED CENTRAL VENOUS "PowerLine" CATHETER PLACEMENT  Anesthesia: Local Anesthetic Complications: None Estimated Blood Loss: Minimal Specimens:  None  Findings:  Successful placement of right-sided, 25 cm (tip-to-cuff), tunneled "PowerLine" catheter with the tip of the catheter in the proximal right atrium.  Plan: Catheter ready for use.  See detailed procedure note with images in PACS. The patient tolerated the procedure well without incident or complication and was returned to Recovery in stable condition.    Roanna Banning, MD [facility] Radiology   Pager. [phone] Clinic. [phone]
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
## 2023-10-16 NOTE — Progress Notes (Signed)
New R chest central line bleeding from insertion site.  IR MD paged, received verbal instruction to hold pressure for 5 mins and apply pressure dressing.  Pt tolerated well.

## 2023-10-17 DIAGNOSIS — N179 Acute kidney failure, unspecified: Secondary | ICD-10-CM | POA: Diagnosis not present

## 2023-10-17 DIAGNOSIS — N1832 Chronic kidney disease, stage 3b: Secondary | ICD-10-CM | POA: Diagnosis not present

## 2023-10-17 LAB — PHOSPHORUS: Phosphorus: 3 mg/dL (ref 2.5–4.6)

## 2023-10-17 LAB — COMPREHENSIVE METABOLIC PANEL
ALT: 14 U/L (ref 0–44)
AST: 22 U/L (ref 15–41)
Albumin: 2 g/dL — ABNORMAL LOW (ref 3.5–5.0)
Alkaline Phosphatase: 36 U/L — ABNORMAL LOW (ref 38–126)
Anion gap: 6 (ref 5–15)
BUN: 21 mg/dL (ref 8–23)
CO2: 28 mmol/L (ref 22–32)
Calcium: 7.6 mg/dL — ABNORMAL LOW (ref 8.9–10.3)
Chloride: 100 mmol/L (ref 98–111)
Creatinine, Ser: 1.24 mg/dL (ref 0.61–1.24)
GFR, Estimated: 60 mL/min — ABNORMAL LOW (ref >60)
Glucose, Bld: 256 mg/dL — ABNORMAL HIGH (ref 70–99)
Potassium: 3.5 mmol/L (ref 3.5–5.1)
Sodium: 134 mmol/L — ABNORMAL LOW (ref 135–145)
Total Bilirubin: 0.7 mg/dL (ref <1.2)
Total Protein: 4.8 g/dL — ABNORMAL LOW (ref 6.5–8.1)

## 2023-10-17 LAB — CBC WITH DIFFERENTIAL/PLATELET
Abs Immature Granulocytes: 0.07 10*3/uL (ref 0.00–0.07)
Basophils Absolute: 0.1 10*3/uL (ref 0.0–0.1)
Basophils Relative: 1 %
Eosinophils Absolute: 0.4 10*3/uL (ref 0.0–0.5)
Eosinophils Relative: 4 %
HCT: 24.9 % — ABNORMAL LOW (ref 39.0–52.0)
Hemoglobin: 8.2 g/dL — ABNORMAL LOW (ref 13.0–17.0)
Immature Granulocytes: 1 %
Lymphocytes Relative: 6 %
Lymphs Abs: 0.5 10*3/uL — ABNORMAL LOW (ref 0.7–4.0)
MCH: 29.9 pg (ref 26.0–34.0)
MCHC: 32.9 g/dL (ref 30.0–36.0)
MCV: 90.9 fL (ref 80.0–100.0)
Monocytes Absolute: 0.5 10*3/uL (ref 0.1–1.0)
Monocytes Relative: 6 %
Neutro Abs: 7.4 10*3/uL (ref 1.7–7.7)
Neutrophils Relative %: 82 %
Platelets: 286 10*3/uL (ref 150–400)
RBC: 2.74 MIL/uL — ABNORMAL LOW (ref 4.22–5.81)
RDW: 14.4 % (ref 11.5–15.5)
WBC: 8.9 10*3/uL (ref 4.0–10.5)
nRBC: 0 % (ref 0.0–0.2)

## 2023-10-17 LAB — GLUCOSE, CAPILLARY
Glucose-Capillary: 113 mg/dL — ABNORMAL HIGH (ref 70–99)
Glucose-Capillary: 112 mg/dL — ABNORMAL HIGH (ref 70–99)
Glucose-Capillary: 117 mg/dL — ABNORMAL HIGH (ref 70–99)
Glucose-Capillary: 115 mg/dL — ABNORMAL HIGH (ref 70–99)
Glucose-Capillary: 142 mg/dL — ABNORMAL HIGH (ref 70–99)

## 2023-10-17 LAB — MAGNESIUM: Magnesium: 2.1 mg/dL (ref 1.7–2.4)

## 2023-10-17 MED ORDER — FAT EMUL FISH OIL/PLANT BASED 20% (SMOFLIPID)IV EMUL
250.0000 mL | INTRAVENOUS | Status: AC
  Administered 2023-10-17: 250 mL via INTRAVENOUS
  Filled 2023-10-17: qty 250

## 2023-10-17 MED ORDER — FLUTICASONE PROPIONATE 50 MCG/ACT NA SUSP
2.0000 | Freq: Every day | NASAL | Status: DC
  Administered 2023-10-17 – 2023-10-31 (×15): 2 via NASAL
  Filled 2023-10-17: qty 16

## 2023-10-17 MED ORDER — INSULIN ASPART 100 UNIT/ML IJ SOLN: 0.0000 [IU] | INTRAMUSCULAR | Status: DC

## 2023-10-17 MED ORDER — ADULT MULTIVITAMIN W/MINERALS CH
1.0000 | ORAL_TABLET | Freq: Every day | ORAL | Status: DC
  Administered 2023-10-17 – 2023-10-31 (×15): 1 via ORAL
  Filled 2023-10-17 (×15): qty 1

## 2023-10-17 MED ORDER — FUROSEMIDE 10 MG/ML IJ SOLN
40.0000 mg | Freq: Every day | INTRAMUSCULAR | Status: DC
  Administered 2023-10-17 – 2023-10-19 (×3): 40 mg via INTRAVENOUS
  Filled 2023-10-17 (×3): qty 4

## 2023-10-17 MED ORDER — SALINE SPRAY 0.65 % NA SOLN
1.0000 | NASAL | Status: DC | PRN
  Administered 2023-10-17: 1 via NASAL
  Filled 2023-10-17: qty 44

## 2023-10-17 MED ORDER — POTASSIUM CHLORIDE 10 MEQ/100ML IV SOLN
10.0000 meq | INTRAVENOUS | Status: AC
  Administered 2023-10-17 (×6): 10 meq via INTRAVENOUS
  Filled 2023-10-17 (×6): qty 100

## 2023-10-17 MED ORDER — TRACE MINERALS CU-MN-SE-ZN 300-55-60-3000 MCG/ML IV SOLN
INTRAVENOUS | Status: AC
  Filled 2023-10-17 (×2): qty 2000

## 2023-10-17 MED ORDER — BUSPIRONE HCL 5 MG PO TABS
5.0000 mg | ORAL_TABLET | Freq: Two times a day (BID) | ORAL | Status: DC
  Administered 2023-10-17 – 2023-10-31 (×30): 5 mg via ORAL
  Filled 2023-10-17 (×30): qty 1

## 2023-10-17 NOTE — Progress Notes (Addendum)
PROGRESS NOTE    DRAGON STAGG  ZOX:[id number] DOB: 07-24-1946 DOA: 09/19/2023 PCP: Elizabeth Palau, FNP  Chief Complaint  Patient presents with   Trauma   Level 1   Crush Injury    Brief Narrative:   77 year old with history of CHF, Tiffanni Scarfo-fib, CAD, HTN, CKD, BPH presenting with left lower extremity trauma Anteroapical and then falling on the floor. Apparently was trapped for about 2 hours until family found him. Upon admission CT head, cervical spine negative, CT chest abdomen pelvis showed acute T12 compression fracture, CT of lower extremity showed asymmetric enlargement of muscle on the left.  Patient was admitted for left femoral condyle avulsion fracture, severe rhabdomyolysis and AKI on CKD.   Renal was consulted.  He required dialysis (x2 sessions - HD catheter removed 11/12).  His renal function has now improved and the temporary HD cath has been removed.    Hospitalization complicated by acute cholecystitis.  He's s/p perc chole tube.  He's now developed an ileus (see CT 11/20).  Due to his recent AKI on CKD, Gearold Wainer central line was placed and he was started on TPN (11/25).    He's been started on lasix now for volume overload as well.     See below for additional details   Assessment & Plan:   Principal Problem:   Acute kidney injury superimposed on stage 3b chronic kidney disease (HCC) Active Problems:   Atrial fibrillation with RVR (HCC)   Essential hypertension   Closed left femoral condyle avulsion fracture (HCC)   Coronary artery disease   Anemia of chronic disease   Malnutrition of moderate degree   Persistent atrial fibrillation (HCC)  Ileus  CT with diffuse colonic distension with multiple gas fluid levels c/w adynamic colonic ileus, gastric distension KUB with interval decrease in gaseous distension of stomach and small bowel, persistent gaseous distension of the colon  Exam relatively stable, still distended.  He's passing gas and having BM's.  Currently  tolerating full liquid diet without nausea/vomiting.   TPN as he's unable to get adequate nutrition, s/p tunneled powerline by IR 11/25.  Continue tpn per pharmacy.   Poor PO intake Moderate Malnutrition TPN as above  Acute cholecystitis s/p cholecystostomy tube 11/13-fluid cx Citrobacter: HIDA 11/13  positive for acute cholecystitis [facility] was consulted, given his comorbidities felt to be high risk for surgical intervention  s/p IR cholecystostomy tube placed 11/13 Gallbladder culture with Citrobacter blood culture 11/2 NGTD.   Ceftriaxone 11/11-11/15, flagyl 11/11-11/13 Unasyn 11/16-11/23  Oliguric AKI on CKD 3B  2/2 rhabdomyolysis and contrast nephrotoxicity:  Patient needed HD on 11/2, subsequently discontinued and HD cath removed 11/12.   Renal function improving, nephro signed off.  Continue to encourage oral intake as it is poor.  Renal function back  to baseline -> follow as diuretics started (baseline creatinine 1.5-1.8 - peaked during this admission at 7.23)  Traumatic rhabdomyolysis: CK has improved overall continue oral hydration   Acute hypoxic respiratory failure: Mild on 1-2 L [place]  Multifactorial in the setting of CHF/volume overload, acute cholecystitis and abdominal pain, continue supplement oxygen, encourage I-S PT OT, ambulate Oob, IS.  Wean as tolerated.  Acute on chronic diastolic CHF Volume Overload Hypoalbuminemia Appears overloaded on exam (CT yesterday showed small bilateral effusions, trace ascites, and body wall edema) Echo 08/2023 with EF 40-45% Continue metoprolol Consider resuming entresto 11/27  Start lasix 40 mg daily Net negative 10.3 L    Scrotal edema Elevate.   Permanent Shyla Gayheart Fib with RVR  Prolonged Qtc Essential hypertension:: BP initially soft/hypotensive limiting titration of Toprol-XL, was placed on midodrine now BP stable and trending up-we will stop midodrine 11/20 and monitor BP. Continue Toprol (increased to 50 mg -> may need additional  increase, will monitor) and amiodarone and adjust Toprol if needed for HR.  Will repeat EKG for qtc eval -> improved 11/22  Communited Minimally Displaced Tibial Plateau Fracture Nondisplaced Fracture of the Fibular Head Mildly Displaced Cortical Avulsion Fracture at the MCL attachment site of the medial femoral condyle Lipohemarthrosis Per ortho note 10/30, WBAT with ROM knee brace.  Dr. Aundria Rud to follow, may need reconstructive surgery down the road.    Anxiety  Depression Notes significant anxiety, like he wants to jump out of bed On xanax at home, but uses infrequently -> using rather regularly here Wellbutrin, lexapro  Xanax prn.  With continued anxiety, will start buspar.   He's open to talking to chaplain, but wasn't interested in [place] consult at the time of our conversation  Enterococcus faecalis UTI: Completed abx   CAD w/ PCI placed 2 months ago: No chest pain. Cont home Plavix.  Hypokalemia: Replace and follow   Anemia of chronic disease ABLA: S/p 1 unit pRBC 11/16 (2 units FFP were given on 10/29) Labs c/w aocd.  Normal B12, folate.   . Obesity Body mass index is 28.47 kg/m.      DVT prophylaxis: eliquis Code Status: full Family Communication: wife Disposition:   Status is: Inpatient Remains inpatient appropriate because: pending improvement   Consultants:  surgery  Procedures:  11/25 MPRESSION: Successful placement of 25 cm dual lumen tunneled "Powerline" central venous catheter via the RIGHT internal jugular vein   The tip of the catheter is positioned within the proximal RIGHT atrium. The catheter is ready for immediate use.  11/13 IMPRESSION: Status post percutaneous cholecystostomy  11/1 IMPRESSION: Status post right IJ temporary hemodialysis catheter  Antimicrobials:  Anti-infectives (From admission, onward)    Start     Dose/Rate Route Frequency Ordered Stop   10/11/23 1600  Ampicillin-Sulbactam (UNASYN) 3 g in sodium chloride 0.9  % 100 mL IVPB        3 g 200 mL/hr over 30 Minutes Intravenous Every 6 hours 10/11/23 1211 10/14/23 2141   10/07/23 2200  Ampicillin-Sulbactam (UNASYN) 3 g in sodium chloride 0.9 % 100 mL IVPB  Status:  Discontinued        3 g 200 mL/hr over 30 Minutes Intravenous Every 12 hours 10/07/23 1215 10/11/23 1211   10/05/23 0000  cefOXitin (MEFOXIN) 2 g in sodium chloride 0.9 % 100 mL IVPB  Status:  Discontinued        2 g 200 mL/hr over 30 Minutes Intravenous To Radiology 10/04/23 1450 10/04/23 1455   10/04/23 1545  cefOXitin (MEFOXIN) 2 g in sodium chloride 0.9 % 100 mL IVPB  Status:  Discontinued        2 g 200 mL/hr over 30 Minutes Intravenous To Radiology 10/04/23 1455 10/04/23 1718   10/03/23 0600  cefTRIAXone (ROCEPHIN) 2 g in sodium chloride 0.9 % 100 mL IVPB  Status:  Discontinued       Note to Pharmacy: Start after blood cultures drawn   2 g 200 mL/hr over 30 Minutes Intravenous Every 24 hours 10/03/23 0514 10/07/23 1215   10/03/23 0600  metroNIDAZOLE (FLAGYL) tablet 500 mg  Status:  Discontinued        500 mg Oral Every 12 hours 10/03/23 0514 10/05/23 1610  Subjective: Wife at bedside Some soup yesterday, 2 fruit cups, 2 popsicles - still early satiety   Objective: Vitals:   10/16/23 2326 10/17/23 0258 10/17/23 0344 10/17/23 0809  BP: (!) 132/103   (!) 154/85  Pulse:    (!) 111  Resp: 20   20  Temp: 98.1 F (36.7 C)  98.1 F (36.7 C) 98 F (36.7 C)  TempSrc: Oral  Oral Oral  SpO2:    94%  Weight:  90 kg    Height:        Intake/Output Summary (Last 24 hours) at 10/17/2023 0849 Last data filed at 10/17/2023 0400 Gross per 24 hour  Intake 594.72 ml  Output 2150 ml  Net -1555.28 ml   Filed Weights   10/15/23 0532 10/16/23 0401 10/17/23 0258  Weight: 91 kg 89.5 kg 90 kg    Examination:  General: No acute distress. Cardiovascular: RRR Lungs: unlabored Abdomen: remains distended, soft, nontender, perc chole dressing Neurological: Alert and oriented 3.  Moves all extremities 4. Cranial nerves II through XII grossly intact. Extremities: trace LE edema bilaterally, 2+ at hips - knee brace  Data Reviewed: I have personally reviewed following labs and imaging studies  CBC: Recent Labs  Lab 10/13/23 0412 10/14/23 0832 10/15/23 0834 10/16/23 0440 10/17/23 0349  WBC 11.1* 11.2* 10.0 10.4 8.9  NEUTROABS  --   --   --  8.4* 7.4  HGB 8.5* 9.1* 9.1* 9.2* 8.2*  HCT 26.6* 29.1* 28.4* 28.6* 24.9*  MCV 92.7 91.8 92.5 92.0 90.9  PLT 365 343 310 294 286    Basic Metabolic Panel: Recent Labs  Lab 10/13/23 0412 10/13/23 1100 10/14/23 0832 10/15/23 0834 10/16/23 0440 10/17/23 0349  NA 143  --  142 145 141 134*  K 2.8*  --  3.5 3.6 3.2* 3.5  CL 102  --  104 104 104 100  CO2 28  --  30 31 31 28   GLUCOSE 85  --  111* 105* 101* 256*  BUN 48*  --  37* 30* 24* 21  CREATININE 2.33*  --  2.04* 1.84* 1.69* 1.24  CALCIUM 8.3*  --  8.3* 8.2* 8.0* 7.6*  MG  --  1.8 1.7 1.9 1.8 2.1  PHOS  --   --  3.3  --  2.7 3.0    GFR: Estimated Creatinine Clearance: 56.3 mL/min (by C-G formula based on SCr of 1.24 mg/dL).  Liver Function Tests: Recent Labs  Lab 10/14/23 0832 10/16/23 0440 10/17/23 0349  AST  --  21 22  ALT  --  16 14  ALKPHOS  --  41 36*  BILITOT  --  0.7 0.7  PROT  --  5.3* 4.8*  ALBUMIN 2.1* 2.1* 2.0*     CBG: Recent Labs  Lab 10/17/23 0811  GLUCAP 113*      No results found for this or any previous visit (from the past 240 hour(s)).        Radiology Studies: IR Fluoro Guide CV Line Right  Result Date: 10/16/2023 INDICATION: TPN access EXAM: ULTRASOUND AND FLUOROSCOPIC GUIDED PLACEMENT OF TUNNELED "Powerline" CENTRAL VENOUS CATHETER MEDICATIONS: None. ANESTHESIA/SEDATION: Local anesthetic was administered. FLUOROSCOPY TIME:  Fluoroscopic dose; 0 mGy COMPLICATIONS: None immediate. PROCEDURE: Informed written consent was obtained from the patient and/or patient's representative after Latifa Noble discussion of the risks,  benefits, and alternatives to treatment. Questions regarding the procedure were encouraged and answered. The RIGHT neck and chest were prepped with chlorhexidine in Anairis Knick sterile fashion, and Taisley Mordan sterile drape  was applied covering the operative field. Maximum barrier sterile technique with sterile gowns and gloves were used for the procedure. Deandra Goering timeout was performed prior to the initiation of the procedure. After the overlying soft tissues were anesthetized, Habiba Treloar small venotomy incision was created and Jashiya Bassett micropuncture kit was utilized to access the internal jugular vein. Real-time ultrasound guidance was utilized for vascular access including the acquisition of Timiya Howells permanent ultrasound image documenting patency of the accessed vessel. The microwire was utilized to measure appropriate catheter length. The micropuncture sheath was exchanged for Jacobo Moncrief peel-away sheath over Innocence Schlotzhauer guidewire. Renna Kilmer 6 Fr dual lumen tunneled central venous catheter measuring 25 cm was tunneled in Howard Bunte retrograde fashion from the anterior chest wall to the venotomy incision. The catheter was then placed through the peel-away sheath with tip ultimately positioned at the superior caval-atrial junction. Final catheter positioning was confirmed and documented with Claudette Wermuth spot radiographic image. The catheter aspirates and flushes normally. The catheter was flushed with appropriate volume heparin dwells. The catheter exit site was secured with Jazari Ober 3-0 Ethilon retention suture. The venotomy incision was closed with Dermabond. Dressings were applied. The patient tolerated the procedure well without immediate post procedural complication. FINDINGS: After catheter placement, the tip lies within the superior cavoatrial junction. The catheter aspirates and flushes normally and is ready for immediate use. IMPRESSION: Successful placement of 25 cm dual lumen tunneled "Powerline" central venous catheter via the RIGHT internal jugular vein The tip of the catheter is positioned within the  proximal RIGHT atrium. The catheter is ready for immediate use. Roanna Banning, MD [facility] Radiology Electronically Signed   By: Roanna Banning M.D.   On: 10/16/2023 16:25   IR US Guide Vasc Access Right  Result Date: 10/16/2023 INDICATION: TPN access EXAM: ULTRASOUND AND FLUOROSCOPIC GUIDED PLACEMENT OF TUNNELED "Powerline" CENTRAL VENOUS CATHETER MEDICATIONS: None. ANESTHESIA/SEDATION: Local anesthetic was administered. FLUOROSCOPY TIME:  Fluoroscopic dose; 0 mGy COMPLICATIONS: None immediate. PROCEDURE: Informed written consent was obtained from the patient and/or patient's representative after Julien Berryman discussion of the risks, benefits, and alternatives to treatment. Questions regarding the procedure were encouraged and answered. The RIGHT neck and chest were prepped with chlorhexidine in Shanekia Latella sterile fashion, and Aidan Caloca sterile drape was applied covering the operative field. Maximum barrier sterile technique with sterile gowns and gloves were used for the procedure. Taziah Difatta timeout was performed prior to the initiation of the procedure. After the overlying soft tissues were anesthetized, Hovanes Hymas small venotomy incision was created and Calin Fantroy micropuncture kit was utilized to access the internal jugular vein. Real-time ultrasound guidance was utilized for vascular access including the acquisition of Dietrick Barris permanent ultrasound image documenting patency of the accessed vessel. The microwire was utilized to measure appropriate catheter length. The micropuncture sheath was exchanged for Jermey Closs peel-away sheath over Alithia Zavaleta guidewire. Ngai Parcell 6 Fr dual lumen tunneled central venous catheter measuring 25 cm was tunneled in Cerita Rabelo retrograde fashion from the anterior chest wall to the venotomy incision. The catheter was then placed through the peel-away sheath with tip ultimately positioned at the superior caval-atrial junction. Final catheter positioning was confirmed and documented with Tongela Encinas spot radiographic image. The  catheter aspirates and flushes normally. The catheter was flushed with appropriate volume heparin dwells. The catheter exit site was secured with Nettie Wyffels 3-0 Ethilon retention suture. The venotomy incision was closed with Dermabond. Dressings were applied. The patient tolerated the procedure well without immediate post procedural complication. FINDINGS: After catheter placement, the tip lies within the superior cavoatrial junction.  The catheter aspirates and flushes normally and is ready for immediate use. IMPRESSION: Successful placement of 25 cm dual lumen tunneled "Powerline" central venous catheter via the RIGHT internal jugular vein The tip of the catheter is positioned within the proximal RIGHT atrium. The catheter is ready for immediate use. Roanna Banning, MD [facility] Radiology Electronically Signed   By: Roanna Banning M.D.   On: 10/16/2023 16:25        Scheduled Meds:  (feeding supplement) PROSource Plus  30 mL Oral TID BM   amiodarone  200 mg Oral Daily   apixaban  5 mg Oral BID   brimonidine  1 drop Left Eye Daily   And   timolol  1 drop Left Eye Daily   buPROPion  300 mg Oral Daily   cholecalciferol  1,000 Units Oral Daily   clopidogrel  75 mg Oral Daily   docusate sodium  100 mg Oral Daily   escitalopram  10 mg Oral Daily   feeding supplement  237 mL Oral TID BM   fluticasone  2 spray Each Nare Daily   insulin aspart  0-9 Units Subcutaneous Q4H   metoprolol succinate  50 mg Oral Daily   polyethylene glycol  17 g Oral Daily   senna-docusate  1 tablet Oral QHS   sodium chloride flush  3 mL Intravenous Q12H   sodium chloride flush  5 mL Intracatheter Q8H   cyanocobalamin  100 mcg Oral Daily   Continuous Infusions:  TPN (CLINIMIX-E) Adult 42 mL/hr at 10/16/23 1748      LOS: 27 days    Time spent: over 30 min     Lacretia Nicks, MD [facility]   To contact the attending provider between 7A-7P or the covering  provider during after hours 7P-7A, please log into the web site [email or web address] and access using universal [place] password for that web site. If you do not have the password, please call the hospital operator.  10/17/2023, 8:49 AM

## 2023-10-17 NOTE — Progress Notes (Signed)
OT Cancellation Note  Patient Details Name: Jorge West MRN: [id number] DOB: 05-May-1946   Cancelled Treatment:    Reason Eval/Treat Not Completed: Fatigue/lethargy limiting ability to participate Per family, pt with limited asleep overnight and increased anxiety today with breathing. Sleeping when OT checked back in PM, did not awaken pt. Will follow up at another time for OT session.  Lorre Munroe 10/17/2023, 1:57 PM

## 2023-10-17 NOTE — Progress Notes (Addendum)
   IR Note  Percutaneous cholecystostomy drain placed in IR 10/04/23 He will hear from OP IR scheduler for follow up time and date for 6-8 weeks follow up  Please give Rx for flushes at DC Needs to flush once daily and record OP

## 2023-10-17 NOTE — Progress Notes (Signed)
PHARMACY - TOTAL PARENTERAL NUTRITION CONSULT NOTE   Indication: Prolonged ileus  Patient Measurements: Height: 5\' 10"  (177.8 cm) Weight: 90 kg (198 lb 6.6 oz) IBW/kg (Calculated) : 73 TPN AdjBW (KG): 85.3 Body mass index is 28.47 kg/m. Usual Weight: 88kg  Assessment:  [name] with prolonged admission s/p trauma. Pt had AKI and rhabdomyolysis which progressed to HD requirement. Now with some renal recovery, HD has been stopped. Pt underwent IR placement of cholecystostomy tube on 11/13. Pt now with adynamic ileus as demonstrated on imaging with poor PO intake. Pharmacy has been consulted for TPN to assist with meeting nutritional goals in setting of ileus.   Glucose / Insulin: CBG<180. No hx of DM, Start sSSI q4h.  Electrolytes: K 3.5 (Kcl x 6 runs + TPN), Mg 2.1 (s/p 2g IV x1), CoCa 9.2, Na 134, phos 3 Renal: hx CKD - Scr improved to 1.24 (back to BL of 1.5-1.8); BUN 21 Required 2 sessions of HD this admission Hepatic: LFTs wnl, TG 78, albumin 2 Intake / Output; MIVF: UOP 1 ml/kg/hr, no abd drain output recorded; LBM 11/25; net neg 10L GI Imaging: 11/20 CTAP: adynamic colonic ileus, small bilateral pleural effusions 11/23 slight decrease in gaseous distention in stomach, bowel. Unchanged in colon.  GI Surgeries / Procedures:  11/13 cholecystostomy tube placed  Central access: 11/25  TPN start date: 11/25  Nutritional Goals: Goal TPN rate is: Clinimix 8/10 1L on x5 days and Clinimix 8/10 2L x2 days Plus SMOF Lipids at 20.28ml/hr every day Given concerns of volume overload this admission, will not add additional dextrose fluids at this time  Electrolytes in Clinimix 8/10 E 1L bag: Na 35 mEq, K 30 mEq, Mag 5 mEq, Ca 5 mEq, Phos , Acetate 83, Cl 76 mEq Electrolytes in Clinimix 8/10 E 2L bag: Na , K , Mag , Ca , Phos , Ac , Cl .   Average kcal per day from all sources of parenteral nutrition: 1353 kcal (~71% of kcal needs), average  protein per day: 103g (100% of AA needs)  RD Assessment: Estimated Needs Total Energy Estimated Needs: 1900-2100 Total Protein Estimated Needs: 95-110g Total Fluid Estimated Needs: >/=1.9L  Current Nutrition:  Full liquids + TPN (11/25) Refusing Ensure d/t complaints of diarrhea Prosource TID - refusing  Plan:  Clinimix E 8/10 2L @ 82 mL/hr plus SMOF lipid this evening at 1800 Average daily kcal is 1353/day (~71% of needs) and 103 g protein (100% of needs) Add standard trace elements to TPN Taking PO meds - remove MVI from TPN and give PO Initiate Sensitive q4h SSI and adjust as needed  Monitor TPN labs on Mon/Thurs, RFP daily until stable Monitor ability to adv diet and PO intake Watch volume status and daily weights   Rexford Maus, PharmD, BCPS 10/17/2023 8:38 AM

## 2023-10-18 ENCOUNTER — Encounter (HOSPITAL_COMMUNITY): Payer: Medicare PPO

## 2023-10-18 DIAGNOSIS — N179 Acute kidney failure, unspecified: Secondary | ICD-10-CM | POA: Diagnosis not present

## 2023-10-18 DIAGNOSIS — K567 Ileus, unspecified: Secondary | ICD-10-CM | POA: Insufficient documentation

## 2023-10-18 DIAGNOSIS — K810 Acute cholecystitis: Secondary | ICD-10-CM | POA: Insufficient documentation

## 2023-10-18 DIAGNOSIS — N1832 Chronic kidney disease, stage 3b: Secondary | ICD-10-CM | POA: Diagnosis not present

## 2023-10-18 DIAGNOSIS — L8990 Pressure ulcer of unspecified site, unspecified stage: Secondary | ICD-10-CM | POA: Insufficient documentation

## 2023-10-18 LAB — COMPREHENSIVE METABOLIC PANEL
ALT: 14 U/L (ref 0–44)
AST: 17 U/L (ref 15–41)
Albumin: 2.1 g/dL — ABNORMAL LOW (ref 3.5–5.0)
Alkaline Phosphatase: 38 U/L (ref 38–126)
Anion gap: 5 (ref 5–15)
BUN: 27 mg/dL — ABNORMAL HIGH (ref 8–23)
CO2: 30 mmol/L (ref 22–32)
Calcium: 8 mg/dL — ABNORMAL LOW (ref 8.9–10.3)
Chloride: 104 mmol/L (ref 98–111)
Creatinine, Ser: 1.32 mg/dL — ABNORMAL HIGH (ref 0.61–1.24)
GFR, Estimated: 56 mL/min — ABNORMAL LOW (ref >60)
Glucose, Bld: 112 mg/dL — ABNORMAL HIGH (ref 70–99)
Potassium: 2.8 mmol/L — ABNORMAL LOW (ref 3.5–5.1)
Sodium: 139 mmol/L (ref 135–145)
Total Bilirubin: 0.7 mg/dL (ref <1.2)
Total Protein: 5.1 g/dL — ABNORMAL LOW (ref 6.5–8.1)

## 2023-10-18 LAB — RENAL FUNCTION PANEL
Albumin: 2 g/dL — ABNORMAL LOW (ref 3.5–5.0)
Anion gap: 7 (ref 5–15)
BUN: 37 mg/dL — ABNORMAL HIGH (ref 8–23)
CO2: 26 mmol/L (ref 22–32)
Calcium: 7.7 mg/dL — ABNORMAL LOW (ref 8.9–10.3)
Chloride: 106 mmol/L (ref 98–111)
Creatinine, Ser: 1.32 mg/dL — ABNORMAL HIGH (ref 0.61–1.24)
GFR, Estimated: 56 mL/min — ABNORMAL LOW (ref >60)
Glucose, Bld: 95 mg/dL (ref 70–99)
Phosphorus: 2.8 mg/dL (ref 2.5–4.6)
Potassium: 3.4 mmol/L — ABNORMAL LOW (ref 3.5–5.1)
Sodium: 139 mmol/L (ref 135–145)

## 2023-10-18 LAB — CBC WITH DIFFERENTIAL/PLATELET
Abs Immature Granulocytes: 0.07 10*3/uL (ref 0.00–0.07)
Basophils Absolute: 0 10*3/uL (ref 0.0–0.1)
Basophils Relative: 1 %
Eosinophils Absolute: 0.2 10*3/uL (ref 0.0–0.5)
Eosinophils Relative: 3 %
HCT: 25.8 % — ABNORMAL LOW (ref 39.0–52.0)
Hemoglobin: 8.2 g/dL — ABNORMAL LOW (ref 13.0–17.0)
Immature Granulocytes: 1 %
Lymphocytes Relative: 7 %
Lymphs Abs: 0.6 10*3/uL — ABNORMAL LOW (ref 0.7–4.0)
MCH: 28.8 pg (ref 26.0–34.0)
MCHC: 31.8 g/dL (ref 30.0–36.0)
MCV: 90.5 fL (ref 80.0–100.0)
Monocytes Absolute: 0.6 10*3/uL (ref 0.1–1.0)
Monocytes Relative: 6 %
Neutro Abs: 7.3 10*3/uL (ref 1.7–7.7)
Neutrophils Relative %: 82 %
Platelets: 269 10*3/uL (ref 150–400)
RBC: 2.85 MIL/uL — ABNORMAL LOW (ref 4.22–5.81)
RDW: 14.6 % (ref 11.5–15.5)
WBC: 8.8 10*3/uL (ref 4.0–10.5)
nRBC: 0 % (ref 0.0–0.2)

## 2023-10-18 LAB — PHOSPHORUS: Phosphorus: 2.7 mg/dL (ref 2.5–4.6)

## 2023-10-18 LAB — GLUCOSE, CAPILLARY
Glucose-Capillary: 118 mg/dL — ABNORMAL HIGH (ref 70–99)
Glucose-Capillary: 114 mg/dL — ABNORMAL HIGH (ref 70–99)
Glucose-Capillary: 114 mg/dL — ABNORMAL HIGH (ref 70–99)
Glucose-Capillary: 105 mg/dL — ABNORMAL HIGH (ref 70–99)

## 2023-10-18 LAB — MAGNESIUM: Magnesium: 1.7 mg/dL (ref 1.7–2.4)

## 2023-10-18 MED ORDER — GERHARDT'S BUTT CREAM
TOPICAL_CREAM | Freq: Four times a day (QID) | CUTANEOUS | Status: DC
  Administered 2023-10-20 – 2023-10-29 (×4): 1 via TOPICAL
  Filled 2023-10-18: qty 1
  Filled 2023-10-18: qty 60
  Filled 2023-10-18 (×3): qty 1

## 2023-10-18 MED ORDER — INSULIN ASPART 100 UNIT/ML IJ SOLN: 0.0000 [IU] | Freq: Four times a day (QID) | INTRAMUSCULAR | Status: DC

## 2023-10-18 MED ORDER — FAT EMUL FISH OIL/PLANT BASED 20% (SMOFLIPID)IV EMUL
250.0000 mL | INTRAVENOUS | Status: AC
  Administered 2023-10-18: 250 mL via INTRAVENOUS
  Filled 2023-10-18: qty 250

## 2023-10-18 MED ORDER — MAGNESIUM SULFATE 2 GM/50ML IV SOLN
2.0000 g | Freq: Once | INTRAVENOUS | Status: AC
  Administered 2023-10-18: 2 g via INTRAVENOUS
  Filled 2023-10-18: qty 50

## 2023-10-18 MED ORDER — K PHOS MONO-SOD PHOS DI & MONO 155-852-130 MG PO TABS
500.0000 mg | ORAL_TABLET | Freq: Once | ORAL | Status: AC
  Administered 2023-10-18: 500 mg via ORAL
  Filled 2023-10-18: qty 2

## 2023-10-18 MED ORDER — POTASSIUM CHLORIDE CRYS ER 20 MEQ PO TBCR
60.0000 meq | EXTENDED_RELEASE_TABLET | Freq: Once | ORAL | Status: AC
  Administered 2023-10-18: 60 meq via ORAL
  Filled 2023-10-18: qty 3

## 2023-10-18 MED ORDER — SACUBITRIL-VALSARTAN 24-26 MG PO TABS
1.0000 | ORAL_TABLET | Freq: Every day | ORAL | Status: DC
Start: 2023-10-18 — End: 2023-11-01
  Administered 2023-10-18 – 2023-10-31 (×13): 1 via ORAL
  Filled 2023-10-18 (×14): qty 1

## 2023-10-18 MED ORDER — CHLORHEXIDINE GLUCONATE CLOTH 2 % EX PADS
6.0000 | MEDICATED_PAD | Freq: Every day | CUTANEOUS | Status: DC
  Administered 2023-10-18 – 2023-10-31 (×13): 6 via TOPICAL

## 2023-10-18 MED ORDER — TRACE MINERALS CU-MN-SE-ZN 300-55-60-3000 MCG/ML IV SOLN
INTRAVENOUS | Status: AC
  Filled 2023-10-18 (×2): qty 1000

## 2023-10-18 MED ORDER — POTASSIUM CHLORIDE 10 MEQ/50ML IV SOLN
10.0000 meq | INTRAVENOUS | Status: AC
  Administered 2023-10-18 (×6): 10 meq via INTRAVENOUS
  Filled 2023-10-18 (×6): qty 50

## 2023-10-18 NOTE — Progress Notes (Signed)
PROGRESS NOTE    Jorge West  UUV:[id number] DOB: 04-02-1946 DOA: 09/19/2023 PCP: Elizabeth Palau, FNP     Brief Narrative:  Jorge West is a 77 year old with history of CHF, A-fib, CAD, HTN, CKD, BPH presenting with left lower extremity trauma. He was reportedly changing a tire in the car/tire fell on his left leg. Per EMS report he was trapped for about 2 hours until his family found him and then was brought to [facility] emergency department for evaluation. Upon admission CT head, cervical spine negative, CT chest abdomen pelvis showed acute T12 compression fracture, CT of lower extremity showed asymmetric enlargement of muscle on the left.  Patient was admitted for left femoral condyle avulsion fracture, severe rhabdomyolysis and AKI on CKD.    Renal was consulted.  He required dialysis (x2 sessions - HD catheter removed 11/12).  His renal function has now improved and the temporary HD cath has been removed.     Hospitalization complicated by acute cholecystitis.  He's s/p perc chole tube.  He's now developed an ileus (see CT 11/20).  Due to his recent AKI on CKD, a central line was placed and he was started on TPN (11/25).     He's been started on lasix now for volume overload as well.     New events last 24 hours / Subjective: Patient admits to having some ice cream this morning.  Still does not have a great appetite.  Urine output recorded 2600 mL.  Remains on TPN.  Denies any abdominal pain.  Assessment & Plan:   Active Problems:   Acute kidney injury superimposed on CKD (HCC)   Rhabdomyolysis   Atrial fibrillation with RVR (HCC)   Essential hypertension   Acute on chronic diastolic CHF (congestive heart failure) (HCC)   Closed left femoral condyle avulsion fracture (HCC)   Coronary artery disease   Anemia of chronic disease   Malnutrition of moderate degree   Persistent atrial fibrillation (HCC)   Ileus (HCC)   Pressure injury of skin   Acute  cholecystitis   Ileus -CT with diffuse colonic distension with multiple gas fluid levels c/w adynamic colonic ileus, gastric distension  -KUB with interval decrease in gaseous distension of stomach and small bowel, persistent gaseous distension of the colon  -Having loose stools, tolerating full liquid diet but p.o. intake remains poor.  Remains on TPN for now  Acute cholecystitis status post cholecystostomy tube 11/13 -HIDA 11/13  positive for acute cholecystitis -CCS was consulted, given his comorbidities felt to be high risk for surgical intervention s/p IR cholecystostomy tube placed 11/13 -Gallbladder culture with Citrobacter -Ceftriaxone 11/11-11/15, flagyl 11/11-11/13 -Unasyn 11/16-11/23 -Follow-up with outpatient IR 6 to 8 weeks.  He will need Rx for flushes at discharge  AKI on CKD stage IIIa secondary to rhabdomyolysis, contrast nephrotoxicity -Status post dialysis -Nephrology has now signed off -Baseline creatinine 1.5-1.8 -Stable  Traumatic rhabdomyolysis -CK has improved  Acute on chronic diastolic CHF with volume overload, acute hypoxic respiratory failure -EF 40 to 45% -Lasix started, resume Entresto, metoprolol  Permanent A-fib RVR -Toprol, amiodarone, Eliquis  Communited minimally displaced tibial plateau fracture Nondisplaced fracture of the fibular head Mildly displaced cortical avulsion fracture at the MCL attachment site of the medial femoral condyle Lipohemarthrosis -Per ortho note 10/30, WBAT with ROM knee brace.  Dr. Aundria Rud to follow, may need reconstructive surgery down the road.     Anxiety and depression -Wellbutrin, Lexapro, Xanax as needed, BuSpar  Enterococcus faecalis UTI -Completed antibiotic treatment  CAD -Plavix  Acute blood loss anemia with anemia of chronic disease -Status post 1 unit packed red blood cell 11/16 -Hemoglobin stable  Hypokalemia -Replaced    In agreement with assessment of the pressure ulcer as below:   Pressure Injury 10/14/23 Perineum Medial Stage 1 -  Intact skin with non-blanchable redness of a localized area usually over a bony prominence. (Active)  10/14/23 1540  Location: Perineum  Location Orientation: Medial  Staging: Stage 1 -  Intact skin with non-blanchable redness of a localized area usually over a bony prominence.  Wound Description (Comments):   Present on Admission: No  Dressing Type Foam - Lift dressing to assess site every shift 10/17/23 0900     Nutrition Problem: Moderate Malnutrition Etiology: chronic illness (heart failure, afib)   DVT prophylaxis:  SCDs Start: 09/20/23 0018 Place TED hose Start: 09/20/23 0018 apixaban (ELIQUIS) tablet 5 mg  Code Status: Full code Family Communication: At bedside Disposition Plan: Skilled nursing facility Status is: Inpatient Remains inpatient appropriate because: Remains on TPN    Antimicrobials:  Anti-infectives (From admission, onward)    Start     Dose/Rate Route Frequency Ordered Stop   10/11/23 1600  Ampicillin-Sulbactam (UNASYN) 3 g in sodium chloride 0.9 % 100 mL IVPB        3 g 200 mL/hr over 30 Minutes Intravenous Every 6 hours 10/11/23 1211 10/14/23 2141   10/07/23 2200  Ampicillin-Sulbactam (UNASYN) 3 g in sodium chloride 0.9 % 100 mL IVPB  Status:  Discontinued        3 g 200 mL/hr over 30 Minutes Intravenous Every 12 hours 10/07/23 1215 10/11/23 1211   10/05/23 0000  cefOXitin (MEFOXIN) 2 g in sodium chloride 0.9 % 100 mL IVPB  Status:  Discontinued        2 g 200 mL/hr over 30 Minutes Intravenous To Radiology 10/04/23 1450 10/04/23 1455   10/04/23 1545  cefOXitin (MEFOXIN) 2 g in sodium chloride 0.9 % 100 mL IVPB  Status:  Discontinued        2 g 200 mL/hr over 30 Minutes Intravenous To Radiology 10/04/23 1455 10/04/23 1718   10/03/23 0600  cefTRIAXone (ROCEPHIN) 2 g in sodium chloride 0.9 % 100 mL IVPB  Status:  Discontinued       Note to Pharmacy: Start after blood cultures drawn   2 g 200 mL/hr  over 30 Minutes Intravenous Every 24 hours 10/03/23 0514 10/07/23 1215   10/03/23 0600  metroNIDAZOLE (FLAGYL) tablet 500 mg  Status:  Discontinued        500 mg Oral Every 12 hours 10/03/23 0514 10/05/23 0837        Objective: Vitals:   10/18/23 0345 10/18/23 0811 10/18/23 1138 10/18/23 1226  BP: 128/77 (!) 124/90  113/81  Pulse: 70 (!) 120 (!) 108 87  Resp:  20    Temp:  98 F (36.7 C)  98.1 F (36.7 C)  TempSrc:  Oral  Oral  SpO2: 91% 98% 95% 95%  Weight: 88 kg     Height:        Intake/Output Summary (Last 24 hours) at 10/18/2023 1312 Last data filed at 10/18/2023 1015 Gross per 24 hour  Intake 240 ml  Output 2480 ml  Net -2240 ml   Filed Weights   10/16/23 0401 10/17/23 0258 10/18/23 0345  Weight: 89.5 kg 90 kg 88 kg    Examination:  General exam: Appears calm and comfortable  Respiratory system: Clear to auscultation. Respiratory effort normal. No  respiratory distress. No conversational dyspnea.  Cardiovascular system: S1 & S2 heard. No murmurs. No pedal edema. Gastrointestinal system: Abdomen is nondistended, soft and nontender. Normal bowel sounds heard. Central nervous system: Alert and oriented.  Extremities: Lower extremity in brace Psychiatry: Judgement and insight appear normal. Mood & affect appropriate.   Data Reviewed: I have personally reviewed following labs and imaging studies  CBC: Recent Labs  Lab 10/14/23 0832 10/15/23 0834 10/16/23 0440 10/17/23 0349 10/18/23 0535  WBC 11.2* 10.0 10.4 8.9 8.8  NEUTROABS  --   --  8.4* 7.4 7.3  HGB 9.1* 9.1* 9.2* 8.2* 8.2*  HCT 29.1* 28.4* 28.6* 24.9* 25.8*  MCV 91.8 92.5 92.0 90.9 90.5  PLT 343 310 294 286 269   Basic Metabolic Panel: Recent Labs  Lab 10/14/23 0832 10/15/23 0834 10/16/23 0440 10/17/23 0349 10/18/23 0535  NA 142 145 141 134* 139  K 3.5 3.6 3.2* 3.5 2.8*  CL 104 104 104 100 104  CO2 30 31 31 28 30   GLUCOSE 111* 105* 101* 256* 112*  BUN 37* 30* 24* 21 27*  CREATININE 2.04*  1.84* 1.69* 1.24 1.32*  CALCIUM 8.3* 8.2* 8.0* 7.6* 8.0*  MG 1.7 1.9 1.8 2.1 1.7  PHOS 3.3  --  2.7 3.0 2.7   GFR: Estimated Creatinine Clearance: 52.4 mL/min (A) (by C-G formula based on SCr of 1.32 mg/dL (H)). Liver Function Tests: Recent Labs  Lab 10/14/23 0832 10/16/23 0440 10/17/23 0349 10/18/23 0535  AST  --  21 22 17   ALT  --  16 14 14   ALKPHOS  --  41 36* 38  BILITOT  --  0.7 0.7 0.7  PROT  --  5.3* 4.8* 5.1*  ALBUMIN 2.1* 2.1* 2.0* 2.1*   No results for input(s): "LIPASE", "AMYLASE" in the last 168 hours. No results for input(s): "AMMONIA" in the last 168 hours. Coagulation Profile: No results for input(s): "INR", "PROTIME" in the last 168 hours. Cardiac Enzymes: No results for input(s): "CKTOTAL", "CKMB", "CKMBINDEX", "TROPONINI" in the last 168 hours. BNP (last 3 results) No results for input(s): "PROBNP" in the last 8760 hours. HbA1C: No results for input(s): "HGBA1C" in the last 72 hours. CBG: Recent Labs  Lab 10/17/23 2317 10/18/23 0445 10/18/23 0633 10/18/23 0813 10/18/23 1223  GLUCAP 142* 118* 114* 114* 105*   Lipid Profile: Recent Labs    10/16/23 0440  TRIG 78   Thyroid Function Tests: No results for input(s): "TSH", "T4TOTAL", "FREET4", "T3FREE", "THYROIDAB" in the last 72 hours. Anemia Panel: No results for input(s): "VITAMINB12", "FOLATE", "FERRITIN", "TIBC", "IRON", "RETICCTPCT" in the last 72 hours. Sepsis Labs: No results for input(s): "PROCALCITON", "LATICACIDVEN" in the last 168 hours.  No results found for this or any previous visit (from the past 240 hour(s)).    Radiology Studies: IR Fluoro Guide CV Line Right  Result Date: 10/16/2023 INDICATION: TPN access EXAM: ULTRASOUND AND FLUOROSCOPIC GUIDED PLACEMENT OF TUNNELED "Powerline" CENTRAL VENOUS CATHETER MEDICATIONS: None. ANESTHESIA/SEDATION: Local anesthetic was administered. FLUOROSCOPY TIME:  Fluoroscopic dose; 0 mGy COMPLICATIONS: None immediate. PROCEDURE: Informed  written consent was obtained from the patient and/or patient's representative after a discussion of the risks, benefits, and alternatives to treatment. Questions regarding the procedure were encouraged and answered. The RIGHT neck and chest were prepped with chlorhexidine in a sterile fashion, and a sterile drape was applied covering the operative field. Maximum barrier sterile technique with sterile gowns and gloves were used for the procedure. A timeout was performed prior to the initiation of the  procedure. After the overlying soft tissues were anesthetized, a small venotomy incision was created and a micropuncture kit was utilized to access the internal jugular vein. Real-time ultrasound guidance was utilized for vascular access including the acquisition of a permanent ultrasound image documenting patency of the accessed vessel. The microwire was utilized to measure appropriate catheter length. The micropuncture sheath was exchanged for a peel-away sheath over a guidewire. A 6 Fr dual lumen tunneled central venous catheter measuring 25 cm was tunneled in a retrograde fashion from the anterior chest wall to the venotomy incision. The catheter was then placed through the peel-away sheath with tip ultimately positioned at the superior caval-atrial junction. Final catheter positioning was confirmed and documented with a spot radiographic image. The catheter aspirates and flushes normally. The catheter was flushed with appropriate volume heparin dwells. The catheter exit site was secured with a 3-0 Ethilon retention suture. The venotomy incision was closed with Dermabond. Dressings were applied. The patient tolerated the procedure well without immediate post procedural complication. FINDINGS: After catheter placement, the tip lies within the superior cavoatrial junction. The catheter aspirates and flushes normally and is ready for immediate use. IMPRESSION: Successful placement of 25 cm dual lumen tunneled  "Powerline" central venous catheter via the RIGHT internal jugular vein The tip of the catheter is positioned within the proximal RIGHT atrium. The catheter is ready for immediate use. Roanna Banning, MD [facility] Radiology Electronically Signed   By: Roanna Banning M.D.   On: 10/16/2023 16:25   IR US Guide Vasc Access Right  Result Date: 10/16/2023 INDICATION: TPN access EXAM: ULTRASOUND AND FLUOROSCOPIC GUIDED PLACEMENT OF TUNNELED "Powerline" CENTRAL VENOUS CATHETER MEDICATIONS: None. ANESTHESIA/SEDATION: Local anesthetic was administered. FLUOROSCOPY TIME:  Fluoroscopic dose; 0 mGy COMPLICATIONS: None immediate. PROCEDURE: Informed written consent was obtained from the patient and/or patient's representative after a discussion of the risks, benefits, and alternatives to treatment. Questions regarding the procedure were encouraged and answered. The RIGHT neck and chest were prepped with chlorhexidine in a sterile fashion, and a sterile drape was applied covering the operative field. Maximum barrier sterile technique with sterile gowns and gloves were used for the procedure. A timeout was performed prior to the initiation of the procedure. After the overlying soft tissues were anesthetized, a small venotomy incision was created and a micropuncture kit was utilized to access the internal jugular vein. Real-time ultrasound guidance was utilized for vascular access including the acquisition of a permanent ultrasound image documenting patency of the accessed vessel. The microwire was utilized to measure appropriate catheter length. The micropuncture sheath was exchanged for a peel-away sheath over a guidewire. A 6 Fr dual lumen tunneled central venous catheter measuring 25 cm was tunneled in a retrograde fashion from the anterior chest wall to the venotomy incision. The catheter was then placed through the peel-away sheath with tip ultimately positioned at the  superior caval-atrial junction. Final catheter positioning was confirmed and documented with a spot radiographic image. The catheter aspirates and flushes normally. The catheter was flushed with appropriate volume heparin dwells. The catheter exit site was secured with a 3-0 Ethilon retention suture. The venotomy incision was closed with Dermabond. Dressings were applied. The patient tolerated the procedure well without immediate post procedural complication. FINDINGS: After catheter placement, the tip lies within the superior cavoatrial junction. The catheter aspirates and flushes normally and is ready for immediate use. IMPRESSION: Successful placement of 25 cm dual lumen tunneled "Powerline" central venous catheter via the RIGHT internal jugular  vein The tip of the catheter is positioned within the proximal RIGHT atrium. The catheter is ready for immediate use. Roanna Banning, MD [facility] Radiology Electronically Signed   By: Roanna Banning M.D.   On: 10/16/2023 16:25      Scheduled Meds:  (feeding supplement) PROSource Plus  30 mL Oral TID BM   amiodarone  200 mg Oral Daily   apixaban  5 mg Oral BID   brimonidine  1 drop Left Eye Daily   And   timolol  1 drop Left Eye Daily   buPROPion  300 mg Oral Daily   busPIRone  5 mg Oral BID   Chlorhexidine Gluconate Cloth  6 each Topical Q0600   cholecalciferol  1,000 Units Oral Daily   clopidogrel  75 mg Oral Daily   docusate sodium  100 mg Oral Daily   escitalopram  10 mg Oral Daily   feeding supplement  237 mL Oral TID BM   fluticasone  2 spray Each Nare Daily   furosemide  40 mg Intravenous Daily   insulin aspart  0-9 Units Subcutaneous Q6H   metoprolol succinate  50 mg Oral Daily   multivitamin with minerals  1 tablet Oral QHS   polyethylene glycol  17 g Oral Daily   sacubitril-valsartan  1 tablet Oral Daily   senna-docusate  1 tablet Oral QHS   sodium chloride flush  5 mL Intracatheter Q8H    cyanocobalamin  100 mcg Oral Daily   Continuous Infusions:  TPN (CLINIMIX-E) Adult     And   fat emul(SMOFlipid)     potassium chloride 10 mEq (10/18/23 1210)   TPN (CLINIMIX-E) Adult 82 mL/hr at 10/17/23 1753     LOS: 28 days   Time spent: 40 minutes   Noralee Stain, DO [facility] 10/18/2023, 1:12 PM   Available via Epic secure chat 7am-7pm After these hours, please refer to coverage provider listed on [email or web address]

## 2023-10-18 NOTE — Progress Notes (Signed)
PHARMACY - TOTAL PARENTERAL NUTRITION CONSULT NOTE   Indication: Prolonged ileus  Patient Measurements: Height: 5\' 10"  (177.8 cm) Weight: 88 kg (194 lb 0.1 oz) IBW/kg (Calculated) : 73 TPN AdjBW (KG): 85.3 Body mass index is 27.84 kg/m. Usual Weight: 88kg  Assessment:  [name] with prolonged admission s/p trauma. Pt had AKI and rhabdomyolysis which progressed to HD requirement. Now with some renal recovery, HD has been stopped. Pt underwent IR placement of cholecystostomy tube on 11/13. Pt now with adynamic ileus as demonstrated on imaging with poor PO intake. Pharmacy has been consulted for TPN to assist with meeting nutritional goals in setting of ileus.   Glucose / Insulin: CBG<150. No hx of DM, sSSI q4h. No SSI used Electrolytes: K 2.8 (Kcl x 6 runs + TPN, IV lasix started), Mg 1.7, CoCa 9.5, Na 139, phos 2.7 Renal: hx CKD - Scr stable at 1.32 (back to BL of 1.5-1.8); BUN 27 *Required 2 sessions of HD this admission *Lasix 40mg  IV daily 11/26 >>  Hepatic: LFTs wnl, TG 78, albumin 2.1 Intake / Output; MIVF: UOP 1.2 ml/kg/hr, no abd drain output recorded; LBM 11/26; net neg 13L GI Imaging: 11/20 CTAP: adynamic colonic ileus, small bilateral pleural effusions 11/23 slight decrease in gaseous distention in stomach, bowel. Unchanged in colon.  GI Surgeries / Procedures:  11/13 cholecystostomy tube placed  Central access: 11/25  TPN start date: 11/25  Nutritional Goals: Goal TPN rate is: Clinimix 8/10 1L on x5 days and Clinimix 8/10 2L x2 days Plus SMOF Lipids at 20.48ml/hr every day Given concerns of volume overload this admission, will not add additional dextrose fluids at this time  Electrolytes in Clinimix 8/10 E 1L bag: Na 35 mEq, K 30 mEq, Mag 5 mEq, Ca 5 mEq, Phos , Acetate 83, Cl 76 mEq Electrolytes in Clinimix 8/10 E 2L bag: Na , K , Mag , Ca , Phos , Ac , Cl .   Average kcal per day from all sources of parenteral  nutrition: 1353 kcal (~71% of kcal needs), average protein per day: 103g (100% of AA needs)  RD Assessment: Estimated Needs Total Energy Estimated Needs: 1900-2100 Total Protein Estimated Needs: 95-110g Total Fluid Estimated Needs: >/=1.9L  Current Nutrition:  Full liquids - some soup, fruit cups, popsicle TPN 11/25 >>  Refusing Ensure d/t complaints of diarrhea Prosource TID - accepting ~2 per day  Plan:  Clinimix E 8/10 1L @ 42 mL/hr plus SMOF lipid this evening at 1800 Average daily kcal is 1353/day (~71% of needs) and 103 g protein (100% of needs) Add standard trace elements to TPN Taking PO meds - remove MVI from TPN and give PO Adjust Sensitive SSI to q6h and adjust further as needed  Monitor TPN labs on Mon/Thurs, RFP daily until stable Monitor ability to adv diet and PO intake Watch volume status and daily weights - IV lasix started 11/26 >>   Kcl 10 mEq IV x 6 runs  Mag 2g IV x1  Rexford Maus, PharmD, BCPS 10/18/2023 7:39 AM
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
## 2023-10-18 NOTE — Progress Notes (Signed)
PM follow up:  Renal function stable K up to 3.4 post 6 runs Phos low normal at 2.8  Plan: KCL PO x 1 Neutra Phos 500mg  PO x 1 Labs in AM  [place] D. Laney Potash, PharmD, BCPS, BCCCP 10/18/2023, 7:58 PM
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
## 2023-10-19 DIAGNOSIS — N179 Acute kidney failure, unspecified: Secondary | ICD-10-CM | POA: Diagnosis not present

## 2023-10-19 DIAGNOSIS — N1832 Chronic kidney disease, stage 3b: Secondary | ICD-10-CM | POA: Diagnosis not present

## 2023-10-19 LAB — COMPREHENSIVE METABOLIC PANEL
ALT: 12 U/L (ref 0–44)
AST: 13 U/L — ABNORMAL LOW (ref 15–41)
Albumin: 1.7 g/dL — ABNORMAL LOW (ref 3.5–5.0)
Alkaline Phosphatase: 32 U/L — ABNORMAL LOW (ref 38–126)
Anion gap: 6 (ref 5–15)
BUN: 32 mg/dL — ABNORMAL HIGH (ref 8–23)
CO2: 22 mmol/L (ref 22–32)
Calcium: 6.8 mg/dL — ABNORMAL LOW (ref 8.9–10.3)
Chloride: 108 mmol/L (ref 98–111)
Creatinine, Ser: 1.14 mg/dL (ref 0.61–1.24)
GFR, Estimated: >60 mL/min (ref >60)
Glucose, Bld: 246 mg/dL — ABNORMAL HIGH (ref 70–99)
Potassium: 3.2 mmol/L — ABNORMAL LOW (ref 3.5–5.1)
Sodium: 136 mmol/L (ref 135–145)
Total Bilirubin: 0.4 mg/dL (ref <1.2)
Total Protein: 4.2 g/dL — ABNORMAL LOW (ref 6.5–8.1)

## 2023-10-19 LAB — GLUCOSE, CAPILLARY
Glucose-Capillary: 102 mg/dL — ABNORMAL HIGH (ref 70–99)
Glucose-Capillary: 108 mg/dL — ABNORMAL HIGH (ref 70–99)
Glucose-Capillary: 117 mg/dL — ABNORMAL HIGH (ref 70–99)
Glucose-Capillary: 98 mg/dL (ref 70–99)
Glucose-Capillary: 99 mg/dL (ref 70–99)

## 2023-10-19 LAB — MAGNESIUM: Magnesium: 1.8 mg/dL (ref 1.7–2.4)

## 2023-10-19 LAB — PHOSPHORUS: Phosphorus: 3.6 mg/dL (ref 2.5–4.6)

## 2023-10-19 MED ORDER — TRACE MINERALS CU-MN-SE-ZN 300-55-60-3000 MCG/ML IV SOLN
INTRAVENOUS | Status: AC
  Filled 2023-10-19 (×2): qty 1000

## 2023-10-19 MED ORDER — FAT EMUL FISH OIL/PLANT BASED 20% (SMOFLIPID)IV EMUL
250.0000 mL | INTRAVENOUS | Status: AC
  Administered 2023-10-19: 250 mL via INTRAVENOUS
  Filled 2023-10-19: qty 250

## 2023-10-19 MED ORDER — MAGNESIUM SULFATE 2 GM/50ML IV SOLN
2.0000 g | Freq: Once | INTRAVENOUS | Status: AC
  Administered 2023-10-19: 2 g via INTRAVENOUS
  Filled 2023-10-19: qty 50

## 2023-10-19 MED ORDER — INSULIN ASPART 100 UNIT/ML IJ SOLN: 0.0000 [IU] | Freq: Three times a day (TID) | INTRAMUSCULAR | Status: DC

## 2023-10-19 MED ORDER — POTASSIUM CHLORIDE 10 MEQ/50ML IV SOLN
10.0000 meq | INTRAVENOUS | Status: AC
  Administered 2023-10-19 (×6): 10 meq via INTRAVENOUS
  Filled 2023-10-19 (×6): qty 50

## 2023-10-19 MED ORDER — POTASSIUM CHLORIDE CRYS ER 20 MEQ PO TBCR
20.0000 meq | EXTENDED_RELEASE_TABLET | Freq: Once | ORAL | Status: AC
  Administered 2023-10-19: 20 meq via ORAL
  Filled 2023-10-19: qty 1
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
## 2023-10-19 NOTE — TOC Progression Note (Signed)
Transition of Care [facility]) - Progression Note    Patient Details  Name: Jorge West MRN: [id number] Date of Birth: 1946/07/21  [facility] of [facility] CM/SW Contact  Eduard Roux, [place] Phone Number: 10/19/2023, 11:05 AM  Clinical Narrative:     TOC continues to follow and will assist with discharge planning  Antony Blackbird, MSW, LCSW Clinical Social Worker    Expected Discharge Plan: Skilled Nursing Facility Barriers to Discharge: Continued Medical Work up  Expected Discharge Plan and Services                                               Social Determinants of Health (SDOH) Interventions SDOH Screenings   Food Insecurity: No Food Insecurity (09/20/2023)  Housing: Low Risk  (09/20/2023)  Transportation Needs: No Transportation Needs (09/20/2023)  Utilities: Not At Risk (09/20/2023)  Depression (PHQ2-9): Medium Risk (09/05/2023)  Financial Resource Strain: Low Risk  (04/24/2023)   Received from [facility], [facility]  Physical Activity: Inactive (04/24/2023)   Received from [facility], [facility]  Social Connections: Moderately Integrated (04/24/2023)   Received from [facility], [facility]  Stress: No Stress Concern Present (07/18/2023)   Received from [facility]  Tobacco Use: Medium Risk (09/20/2023)    Readmission Risk Interventions     No data to display

## 2023-10-20 ENCOUNTER — Inpatient Hospital Stay (HOSPITAL_COMMUNITY): Payer: Medicare PPO

## 2023-10-20 DIAGNOSIS — N179 Acute kidney failure, unspecified: Secondary | ICD-10-CM | POA: Diagnosis not present

## 2023-10-20 DIAGNOSIS — N1832 Chronic kidney disease, stage 3b: Secondary | ICD-10-CM | POA: Diagnosis not present

## 2023-10-20 LAB — GLUCOSE, CAPILLARY
Glucose-Capillary: 101 mg/dL — ABNORMAL HIGH (ref 70–99)
Glucose-Capillary: 112 mg/dL — ABNORMAL HIGH (ref 70–99)
Glucose-Capillary: 112 mg/dL — ABNORMAL HIGH (ref 70–99)

## 2023-10-20 LAB — BASIC METABOLIC PANEL
Anion gap: 8 (ref 5–15)
BUN: 36 mg/dL — ABNORMAL HIGH (ref 8–23)
CO2: 25 mmol/L (ref 22–32)
Calcium: 8 mg/dL — ABNORMAL LOW (ref 8.9–10.3)
Chloride: 106 mmol/L (ref 98–111)
Creatinine, Ser: 1.19 mg/dL (ref 0.61–1.24)
GFR, Estimated: >60 mL/min (ref >60)
Glucose, Bld: 109 mg/dL — ABNORMAL HIGH (ref 70–99)
Potassium: 3.2 mmol/L — ABNORMAL LOW (ref 3.5–5.1)
Sodium: 139 mmol/L (ref 135–145)

## 2023-10-20 LAB — MAGNESIUM: Magnesium: 2.1 mg/dL (ref 1.7–2.4)

## 2023-10-20 MED ORDER — PROSOURCE PLUS PO LIQD
30.0000 mL | Freq: Four times a day (QID) | ORAL | Status: DC
  Administered 2023-10-20 – 2023-10-31 (×43): 30 mL via ORAL
  Filled 2023-10-20 (×42): qty 30

## 2023-10-20 MED ORDER — FAT EMUL FISH OIL/PLANT BASED 20% (SMOFLIPID)IV EMUL
250.0000 mL | INTRAVENOUS | Status: AC
  Administered 2023-10-20: 250 mL via INTRAVENOUS
  Filled 2023-10-20: qty 250

## 2023-10-20 MED ORDER — POTASSIUM CHLORIDE 10 MEQ/50ML IV SOLN
10.0000 meq | INTRAVENOUS | Status: AC
  Administered 2023-10-20 (×6): 10 meq via INTRAVENOUS
  Filled 2023-10-20 (×6): qty 50

## 2023-10-20 MED ORDER — POTASSIUM CHLORIDE CRYS ER 20 MEQ PO TBCR
40.0000 meq | EXTENDED_RELEASE_TABLET | Freq: Once | ORAL | Status: AC
  Administered 2023-10-20: 40 meq via ORAL
  Filled 2023-10-20: qty 2

## 2023-10-20 MED ORDER — CLINIMIX E/DEXTROSE (8/10) 8 % IV SOLN
INTRAVENOUS | Status: AC
  Filled 2023-10-20 (×2): qty 1000
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
## 2023-10-20 NOTE — Plan of Care (Signed)
  Problem: Education: Goal: Knowledge of General Education information will improve Description: Including pain rating scale, medication(s)/side effects and non-pharmacologic comfort measures Outcome: Progressing   Problem: Clinical Measurements: Goal: Ability to maintain clinical measurements within normal limits will improve Outcome: Progressing Goal: Will remain free from infection Outcome: Progressing   

## 2023-10-20 NOTE — Progress Notes (Signed)
PROGRESS NOTE    Jorge West  GLO:[id number] DOB: 1946/01/03 DOA: 09/19/2023 PCP: Elizabeth Palau, FNP     Brief Narrative:  Jorge West is a 77 year old with history of CHF, A-fib, CAD, HTN, CKD, BPH presenting with left lower extremity trauma. He was reportedly changing a tire in the car/tire fell on his left leg. Per EMS report he was trapped for about 2 hours until his family found him and then was brought to [facility] emergency department for evaluation. Upon admission CT head, cervical spine negative, CT chest abdomen pelvis showed acute T12 compression fracture, CT of lower extremity showed asymmetric enlargement of muscle on the left.  Patient was admitted for left femoral condyle avulsion fracture, severe rhabdomyolysis and AKI on CKD.    Renal was consulted.  He required dialysis (x2 sessions - HD catheter removed 11/12).  His renal function has now improved and the temporary HD cath has been removed.     Hospitalization complicated by acute cholecystitis.  He's s/p perc chole tube.  He's now developed an ileus (see CT 11/20).  Due to his recent AKI on CKD, a central line was placed and he was started on TPN (11/25).     He's been started on lasix now for volume overload as well.     New events last 24 hours / Subjective: Oral intake continues to improve.  Remains on TPN for now.  Discussed with dietitian on getting calorie count over the weekend.  If adequate, can discontinue TPN on Monday.  Patient continues to have frequent liquid stools, Flexi-Seal has been ordered  Assessment & Plan:   Active Problems:   Acute kidney injury superimposed on CKD (HCC)   Rhabdomyolysis   Atrial fibrillation with RVR (HCC)   Essential hypertension   Acute on chronic diastolic CHF (congestive heart failure) (HCC)   Closed left femoral condyle avulsion fracture (HCC)   Coronary artery disease   Anemia of chronic disease   Malnutrition of moderate degree   Persistent atrial  fibrillation (HCC)   Ileus (HCC)   Pressure injury of skin   Acute cholecystitis   Ileus -CT with diffuse colonic distension with multiple gas fluid levels c/w adynamic colonic ileus, gastric distension  -KUB with interval decrease in gaseous distension of stomach and small bowel, persistent gaseous distension of the colon  -Repeat KUB showed no evidence of bowel obstruction or ileus  -Resolved  Acute cholecystitis status post cholecystostomy tube 11/13 -HIDA 11/13  positive for acute cholecystitis -CCS was consulted, given his comorbidities felt to be high risk for surgical intervention s/p IR cholecystostomy tube placed 11/13 -Gallbladder culture with Citrobacter -Ceftriaxone 11/11-11/15, flagyl 11/11-11/13 -Unasyn 11/16-11/23 -Follow-up with outpatient IR 6 to 8 weeks.  He will need Rx for flushes at discharge  AKI on CKD stage IIIa secondary to rhabdomyolysis, contrast nephrotoxicity -Status post dialysis -Nephrology has now signed off -Baseline creatinine 1.5-1.8 -Stable  Traumatic rhabdomyolysis -CK has improved  Acute on chronic diastolic CHF with volume overload, acute hypoxic respiratory failure -EF 40 to 45% -Entresto, metoprolol  Permanent A-fib RVR -Toprol, amiodarone, Eliquis  Communited minimally displaced tibial plateau fracture Nondisplaced fracture of the fibular head Mildly displaced cortical avulsion fracture at the MCL attachment site of the medial femoral condyle Lipohemarthrosis -Per ortho note 10/30, WBAT with ROM knee brace.  Dr. Aundria Rud to follow, may need reconstructive surgery down the road.     Anxiety and depression -Wellbutrin, Lexapro, Xanax as needed, BuSpar  Enterococcus faecalis UTI -Completed antibiotic  treatment  CAD -Plavix  Acute blood loss anemia with anemia of chronic disease -Status post 1 unit packed red blood cell 11/16 -Hemoglobin stable  Hypokalemia -Replaced    In agreement with assessment of the pressure ulcer  as below:  Pressure Injury 10/14/23 Perineum Medial Stage 1 -  Intact skin with non-blanchable redness of a localized area usually over a bony prominence. redden butoock bilat and mid [place] (Active)  10/14/23 1540  Location: Perineum  Location Orientation: Medial  Staging: Stage 1 -  Intact skin with non-blanchable redness of a localized area usually over a bony prominence.  Wound Description (Comments): redden butoock bilat and mid ota  Present on Admission: No  Dressing Type Foam - Lift dressing to assess site every shift 10/17/23 0900     Nutrition Problem: Moderate Malnutrition Etiology: chronic illness (heart failure, afib)   DVT prophylaxis:  SCDs Start: 09/20/23 0018 Place TED hose Start: 09/20/23 0018 apixaban (ELIQUIS) tablet 5 mg  Code Status: Full code Family Communication: At bedside Disposition Plan: Skilled nursing facility Status is: Inpatient Remains inpatient appropriate because: Remains on TPN    Antimicrobials:  Anti-infectives (From admission, onward)    Start     Dose/Rate Route Frequency Ordered Stop   10/11/23 1600  Ampicillin-Sulbactam (UNASYN) 3 g in sodium chloride 0.9 % 100 mL IVPB        3 g 200 mL/hr over 30 Minutes Intravenous Every 6 hours 10/11/23 1211 10/14/23 2141   10/07/23 2200  Ampicillin-Sulbactam (UNASYN) 3 g in sodium chloride 0.9 % 100 mL IVPB  Status:  Discontinued        3 g 200 mL/hr over 30 Minutes Intravenous Every 12 hours 10/07/23 1215 10/11/23 1211   10/05/23 0000  cefOXitin (MEFOXIN) 2 g in sodium chloride 0.9 % 100 mL IVPB  Status:  Discontinued        2 g 200 mL/hr over 30 Minutes Intravenous To Radiology 10/04/23 1450 10/04/23 1455   10/04/23 1545  cefOXitin (MEFOXIN) 2 g in sodium chloride 0.9 % 100 mL IVPB  Status:  Discontinued        2 g 200 mL/hr over 30 Minutes Intravenous To Radiology 10/04/23 1455 10/04/23 1718   10/03/23 0600  cefTRIAXone (ROCEPHIN) 2 g in sodium chloride 0.9 % 100 mL IVPB  Status:  Discontinued        Note to Pharmacy: Start after blood cultures drawn   2 g 200 mL/hr over 30 Minutes Intravenous Every 24 hours 10/03/23 0514 10/07/23 1215   10/03/23 0600  metroNIDAZOLE (FLAGYL) tablet 500 mg  Status:  Discontinued        500 mg Oral Every 12 hours 10/03/23 0514 10/05/23 0837        Objective: Vitals:   10/20/23 0312 10/20/23 0606 10/20/23 0755 10/20/23 1152  BP:   136/89 138/85  Pulse:   99 90  Resp: 17  18 20   Temp: 98.1 F (36.7 C)  97.9 F (36.6 C) 97.7 F (36.5 C)  TempSrc: Oral  Oral Oral  SpO2:  96% 98% 100%  Weight: 88 kg     Height:        Intake/Output Summary (Last 24 hours) at 10/20/2023 1255 Last data filed at 10/20/2023 1011 Gross per 24 hour  Intake 1295.54 ml  Output 4015 ml  Net -2719.46 ml   Filed Weights   10/17/23 0258 10/18/23 0345 10/20/23 0312  Weight: 90 kg 88 kg 88 kg    Examination:  General exam: Appears  calm and comfortable  Respiratory system: Clear to auscultation. Respiratory effort normal. No respiratory distress. No conversational dyspnea.  Cardiovascular system: S1 & S2 heard.  Irregular rhythm.  No murmurs. No pedal edema. Gastrointestinal system: Abdomen is nondistended, soft and nontender. Normal bowel sounds heard. Central nervous system: Alert and oriented.  Extremities: Left lower extremity in brace Psychiatry: Judgement and insight appear normal. Mood & affect appropriate.   Data Reviewed: I have personally reviewed following labs and imaging studies  CBC: Recent Labs  Lab 10/14/23 0832 10/15/23 0834 10/16/23 0440 10/17/23 0349 10/18/23 0535  WBC 11.2* 10.0 10.4 8.9 8.8  NEUTROABS  --   --  8.4* 7.4 7.3  HGB 9.1* 9.1* 9.2* 8.2* 8.2*  HCT 29.1* 28.4* 28.6* 24.9* 25.8*  MCV 91.8 92.5 92.0 90.9 90.5  PLT 343 310 294 286 269   Basic Metabolic Panel: Recent Labs  Lab 10/16/23 0440 10/17/23 0349 10/18/23 0535 10/18/23 1815 10/19/23 0540 10/20/23 0329  NA 141 134* 139 139 136 139  K 3.2* 3.5 2.8* 3.4*  3.2* 3.2*  CL 104 100 104 106 108 106  CO2 31 28 30 26 22 25   GLUCOSE 101* 256* 112* 95 246* 109*  BUN 24* 21 27* 37* 32* 36*  CREATININE 1.69* 1.24 1.32* 1.32* 1.14 1.19  CALCIUM 8.0* 7.6* 8.0* 7.7* 6.8* 8.0*  MG 1.8 2.1 1.7  --  1.8 2.1  PHOS 2.7 3.0 2.7 2.8 3.6  --    GFR: Estimated Creatinine Clearance: 58.1 mL/min (by C-G formula based on SCr of 1.19 mg/dL). Liver Function Tests: Recent Labs  Lab 10/16/23 0440 10/17/23 0349 10/18/23 0535 10/18/23 1815 10/19/23 0540  AST 21 22 17   --  13*  ALT 16 14 14   --  12  ALKPHOS 41 36* 38  --  32*  BILITOT 0.7 0.7 0.7  --  0.4  PROT 5.3* 4.8* 5.1*  --  4.2*  ALBUMIN 2.1* 2.0* 2.1* 2.0* 1.7*   No results for input(s): "LIPASE", "AMYLASE" in the last 168 hours. No results for input(s): "AMMONIA" in the last 168 hours. Coagulation Profile: No results for input(s): "INR", "PROTIME" in the last 168 hours. Cardiac Enzymes: No results for input(s): "CKTOTAL", "CKMB", "CKMBINDEX", "TROPONINI" in the last 168 hours. BNP (last 3 results) No results for input(s): "PROBNP" in the last 8760 hours. HbA1C: No results for input(s): "HGBA1C" in the last 72 hours. CBG: Recent Labs  Lab 10/19/23 1157 10/19/23 1806 10/19/23 2204 10/20/23 0544 10/20/23 1152  GLUCAP 108* 99 98 101* 112*   Lipid Profile: No results for input(s): "CHOL", "HDL", "LDLCALC", "TRIG", "CHOLHDL", "LDLDIRECT" in the last 72 hours.  Thyroid Function Tests: No results for input(s): "TSH", "T4TOTAL", "FREET4", "T3FREE", "THYROIDAB" in the last 72 hours. Anemia Panel: No results for input(s): "VITAMINB12", "FOLATE", "FERRITIN", "TIBC", "IRON", "RETICCTPCT" in the last 72 hours. Sepsis Labs: No results for input(s): "PROCALCITON", "LATICACIDVEN" in the last 168 hours.  No results found for this or any previous visit (from the past 240 hour(s)).    Radiology Studies: DG Abd 1 View  Result Date: 10/20/2023 CLINICAL DATA:  Ileus, diarrhea EXAM: ABDOMEN - 1 VIEW  COMPARISON:  10/14/2023 FINDINGS: 2 supine frontal views of the abdomen and pelvis excludes the hemidiaphragms by collimation. Percutaneous cholecystostomy tube overlies right upper quadrant. No evidence of bowel obstruction. Decreased gaseous distention of the colon, with gas seen to the level of the rectum. No masses or abnormal calcifications. No acute bony abnormalities. IMPRESSION: 1. No evidence of bowel  obstruction or ileus. Electronically Signed   By: Sharlet Salina M.D.   On: 10/20/2023 12:35      Scheduled Meds:  (feeding supplement) PROSource Plus  30 mL Oral QID   amiodarone  200 mg Oral Daily   apixaban  5 mg Oral BID   brimonidine  1 drop Left Eye Daily   And   timolol  1 drop Left Eye Daily   buPROPion  300 mg Oral Daily   busPIRone  5 mg Oral BID   Chlorhexidine Gluconate Cloth  6 each Topical Q0600   cholecalciferol  1,000 Units Oral Daily   clopidogrel  75 mg Oral Daily   escitalopram  10 mg Oral Daily   fluticasone  2 spray Each Nare Daily   Gerhardt's butt cream   Topical QID   metoprolol succinate  50 mg Oral Daily   multivitamin with minerals  1 tablet Oral QHS   sacubitril-valsartan  1 tablet Oral Daily   sodium chloride flush  5 mL Intracatheter Q8H   cyanocobalamin  100 mcg Oral Daily   Continuous Infusions:  TPN (CLINIMIX-E) Adult     And   fat emul(SMOFlipid)     potassium chloride 10 mEq (10/20/23 1233)   TPN (CLINIMIX-E) Adult 42 mL/hr at 10/19/23 1732     LOS: 30 days   Time spent: 25 minutes   Noralee Stain, DO [facility] 10/20/2023, 12:55 PM   Available via Epic secure chat 7am-7pm After these hours, please refer to coverage provider listed on [email or web address]

## 2023-10-20 NOTE — Progress Notes (Signed)
PHARMACY - TOTAL PARENTERAL NUTRITION CONSULT NOTE   Indication: Prolonged ileus  Patient Measurements: Height: 5\' 10"  (177.8 cm) Weight: 88 kg (194 lb 0.1 oz) IBW/kg (Calculated) : 73 TPN AdjBW (KG): 85.3 Body mass index is 27.84 kg/m. Usual Weight: 88kg  Assessment:  [name] with prolonged admission s/p trauma. Pt had AKI and rhabdomyolysis which progressed to HD requirement. Now with some renal recovery, HD has been stopped. Pt underwent IR placement of cholecystostomy tube on 11/13. Pt now with adynamic ileus as demonstrated on imaging with poor PO intake. Pharmacy has been consulted for TPN to assist with meeting nutritional goals in setting of ileus.   Glucose / Insulin: CBG<150. A.m. gluc >200 on 11/25 and 11/28 morning blood draw - ?accuracy of these draws - not drawing from central line since all others ok. No hx of DM, sSSI q8h. No SSI used. Electrolytes: K 3.2, Mg 2.1, CoCa 9.8, others wnl Renal: hx CKD - Scr 1.19 (BL of 1.5-1.8); BUN 20-30s *Required 2 sessions of HD this admission *Lasix 40mg  IV daily 11/26 >> 11/28 Hepatic: LFTs wnl, TG 78, albumin 2.1 Intake / Output; MIVF: UOP 2.2 ml/kg/hr; LBM 11/28 (liquid and green per RN GI Imaging: 11/20 CTAP: adynamic colonic ileus, small bilateral pleural effusions 11/23 slight decrease in gaseous distention in stomach, bowel. Unchanged in colon.  GI Surgeries / Procedures:  11/13 cholecystostomy tube placed  Central access: 11/25  TPN start date: 11/25  Nutritional Goals: Goal TPN rate is: Clinimix 8/10 1L on x5 days (Mon/Wed/Thur/Fri/Sun)  and Clinimix 8/10 2L x2 days (Tues/Sat) Plus SMOF Lipids at 20.9ml/hr every day Given concerns of volume overload this admission, will not add additional dextrose fluids at this time  Electrolytes in Clinimix 8/10 E 1L bag: Na 35 mEq, K 30 mEq, Mag 5 mEq, Ca 5 mEq, Phos , Acetate 83, Cl 76 mEq Electrolytes in Clinimix 8/10 E 2L bag: Na , K , Mag , Ca ,  Phos , Ac , Cl .   Average kcal per day from all sources of parenteral nutrition: 1353 kcal (~71% of kcal needs), average protein per day: 103g (100% of AA needs)  RD Assessment: Estimated Needs Total Energy Estimated Needs: 1900-2100 Total Protein Estimated Needs: 95-110g Total Fluid Estimated Needs: >/=1.9L  Current Nutrition:  Soft diet >> pt not taking much TPN 11/25 >>  Prosource TID - accepting ~2 per day  Plan:  Clinimix E 8/10 1L @ 42 mL/hr plus SMOF lipid this evening at 1800 Average daily kcal is 1353/day (~71% of needs) and 103 g protein (100% of needs) Taking PO meds - receiving MVI with minerals PO (MVI and trace elements taken out of TPN) D/C SSI and CBG checks MD ordered KCl 10 mEq IV x 6 runs. Will also order KCl 40 mEq oral. Monitor TPN labs on Mon/Thurs, daily until stable - BMET in a.m. Monitor PO intake and ability to start weaning TPN   Christoper Fabian, PharmD, BCPS Please see amion for complete clinical pharmacist phone list 10/20/2023 7:41 AM

## 2023-10-20 NOTE — Progress Notes (Signed)
Patient has had freq. Liq green B.M.'s tonight and yesterday. Rectal pouch did not stay on. Buttocks is very redden and painful. Would patient benefit from Flex seal ?

## 2023-10-21 DIAGNOSIS — N1832 Chronic kidney disease, stage 3b: Secondary | ICD-10-CM | POA: Diagnosis not present

## 2023-10-21 DIAGNOSIS — N179 Acute kidney failure, unspecified: Secondary | ICD-10-CM | POA: Diagnosis not present

## 2023-10-21 LAB — BASIC METABOLIC PANEL
Anion gap: 6 (ref 5–15)
BUN: 34 mg/dL — ABNORMAL HIGH (ref 8–23)
CO2: 25 mmol/L (ref 22–32)
Calcium: 8.1 mg/dL — ABNORMAL LOW (ref 8.9–10.3)
Chloride: 107 mmol/L (ref 98–111)
Creatinine, Ser: 1 mg/dL (ref 0.61–1.24)
GFR, Estimated: >60 mL/min (ref >60)
Glucose, Bld: 116 mg/dL — ABNORMAL HIGH (ref 70–99)
Potassium: 3.4 mmol/L — ABNORMAL LOW (ref 3.5–5.1)
Sodium: 138 mmol/L (ref 135–145)

## 2023-10-21 LAB — GLUCOSE, CAPILLARY: Glucose-Capillary: 100 mg/dL — ABNORMAL HIGH (ref 70–99)

## 2023-10-21 MED ORDER — LIDOCAINE HCL URETHRAL/MUCOSAL 2 % EX GEL
1.0000 | Freq: Once | CUTANEOUS | Status: AC
  Administered 2023-10-21: 1 via TOPICAL
  Filled 2023-10-21: qty 6

## 2023-10-21 MED ORDER — AMLODIPINE BESYLATE 10 MG PO TABS
10.0000 mg | ORAL_TABLET | Freq: Every day | ORAL | Status: DC
  Administered 2023-10-21 – 2023-10-23 (×3): 10 mg via ORAL
  Filled 2023-10-21 (×3): qty 1

## 2023-10-21 MED ORDER — POTASSIUM CHLORIDE CRYS ER 20 MEQ PO TBCR
40.0000 meq | EXTENDED_RELEASE_TABLET | Freq: Once | ORAL | Status: AC
  Administered 2023-10-21: 40 meq via ORAL
  Filled 2023-10-21: qty 2

## 2023-10-21 MED ORDER — CLINIMIX E/DEXTROSE (8/10) 8 % IV SOLN
INTRAVENOUS | Status: AC
  Filled 2023-10-21 (×2): qty 2000

## 2023-10-21 MED ORDER — FAT EMUL FISH OIL/PLANT BASED 20% (SMOFLIPID)IV EMUL
250.0000 mL | INTRAVENOUS | Status: AC
  Administered 2023-10-21: 250 mL via INTRAVENOUS
  Filled 2023-10-21: qty 250

## 2023-10-21 NOTE — Progress Notes (Signed)
[facility] night cross cover note:   I was notified by RN that the patient's bladder scan was 770 cc's, with patient unable to void further, and abdomen noted to be mildly distended.  I subsequently ordered straight cath x 1 now as well as q4-hour bladder scans with prn straight cath for ensuing postvoid residual greater than 400 cc.     Jorge Pigg, DO Hospitalist

## 2023-10-21 NOTE — Plan of Care (Signed)
  Problem: Education: Goal: Knowledge of General Education information will improve Description: Including pain rating scale, medication(s)/side effects and non-pharmacologic comfort measures Outcome: Progressing   Problem: Health Behavior/Discharge Planning: Goal: Ability to manage health-related needs will improve Outcome: Progressing   Problem: Clinical Measurements: Goal: Ability to maintain clinical measurements within normal limits will improve Outcome: Progressing Goal: Will remain free from infection Outcome: Progressing Goal: Diagnostic test results will improve Outcome: Progressing Goal: Respiratory complications will improve Outcome: Progressing Goal: Cardiovascular complication will be avoided Outcome: Progressing   Problem: Activity: Goal: Risk for activity intolerance will decrease Outcome: Progressing   Problem: Nutrition: Goal: Adequate nutrition will be maintained Outcome: Progressing   Problem: Coping: Goal: Level of anxiety will decrease Outcome: Progressing   Problem: Pain Management: Goal: General experience of comfort will improve Outcome: Progressing   Problem: Skin Integrity: Goal: Risk for impaired skin integrity will decrease Outcome: Progressing

## 2023-10-21 NOTE — Progress Notes (Addendum)
Patient unable to voided. Ado. Distended firm. Bladder scan 770 ml Tim RN aware wil page M.D. on call. Dr. Arlean Hopping paged.

## 2023-10-21 NOTE — Progress Notes (Signed)
PROGRESS NOTE    Jorge West  GEX:[id number] DOB: 07/15/1946 DOA: 09/19/2023 PCP: Elizabeth Palau, FNP     Brief Narrative:  Jorge West is a 77 year old with history of CHF, A-fib, CAD, HTN, CKD, BPH presenting with left lower extremity trauma. He was reportedly changing a tire in the car/tire fell on his left leg. Per EMS report he was trapped for about 2 hours until his family found him and then was brought to [facility] emergency department for evaluation. Upon admission CT head, cervical spine negative, CT chest abdomen pelvis showed acute T12 compression fracture, CT of lower extremity showed asymmetric enlargement of muscle on the left.  Patient was admitted for left femoral condyle avulsion fracture, severe rhabdomyolysis and AKI on CKD.    Renal was consulted.  He required dialysis (x2 sessions - HD catheter removed 11/12).  His renal function has now improved and the temporary HD cath has been removed.     Hospitalization complicated by acute cholecystitis.  He's s/p perc chole tube.  He's now developed an ileus (see CT 11/20).  Due to his recent AKI on CKD, a central line was placed and he was started on TPN (11/25).     He's been started on lasix now for volume overload as well.     New events last 24 hours / Subjective: Calorie count in process.  Had urinary retention requiring In-N-Out cath x 2 last night.   Assessment & Plan:   Active Problems:   Acute kidney injury superimposed on CKD (HCC)   Rhabdomyolysis   Atrial fibrillation with RVR (HCC)   Essential hypertension   Acute on chronic diastolic CHF (congestive heart failure) (HCC)   Closed left femoral condyle avulsion fracture (HCC)   Coronary artery disease   Anemia of chronic disease   Malnutrition of moderate degree   Persistent atrial fibrillation (HCC)   Ileus (HCC)   Pressure injury of skin   Acute cholecystitis   Ileus -CT with diffuse colonic distension with multiple gas fluid levels  c/w adynamic colonic ileus, gastric distension  -KUB with interval decrease in gaseous distension of stomach and small bowel, persistent gaseous distension of the colon  -Repeat KUB showed no evidence of bowel obstruction or ileus  -Resolved -Calorie count in process.  Hopeful to discontinue TPN Monday if p.o. intake adequate -Flexi-Seal in place due to liquid stools.  Hopeful to discontinue as his oral intake improves and stools form  Acute cholecystitis status post cholecystostomy tube 11/13 -HIDA 11/13  positive for acute cholecystitis -CCS was consulted, given his comorbidities felt to be high risk for surgical intervention s/p IR cholecystostomy tube placed 11/13 -Gallbladder culture with Citrobacter -Ceftriaxone 11/11-11/15, flagyl 11/11-11/13 -Unasyn 11/16-11/23 -Follow-up with outpatient IR 6 to 8 weeks.  He will need Rx for flushes at discharge  AKI on CKD stage IIIa secondary to rhabdomyolysis, contrast nephrotoxicity -Status post dialysis -Nephrology has now signed off -Baseline creatinine 1.5-1.8 -Stable  Traumatic rhabdomyolysis -CK has improved  Acute on chronic diastolic CHF with volume overload, acute hypoxic respiratory failure -EF 40 to 45% -Entresto, metoprolol  Permanent A-fib RVR -Toprol, amiodarone, Eliquis  Communited minimally displaced tibial plateau fracture Nondisplaced fracture of the fibular head Mildly displaced cortical avulsion fracture at the MCL attachment site of the medial femoral condyle Lipohemarthrosis -Per ortho note 10/30, WBAT with ROM knee brace.  Dr. Aundria Rud to follow, may need reconstructive surgery down the road.     Anxiety and depression -Wellbutrin, Lexapro, Xanax as needed,  BuSpar  Enterococcus faecalis UTI -Completed antibiotic treatment  CAD -Plavix  Acute blood loss anemia with anemia of chronic disease -Status post 1 unit packed red blood cell 11/16 -Hemoglobin stable  Hypokalemia -Replaced  Acute urinary  retention -No report of prostate enlargement on CT pelvis.  Could be in setting of critical illness and immobility over the past month.  In-N-Out cath x 2 overnight.  Retaining again this morning.  Foley catheter ordered    In agreement with assessment of the pressure ulcer as below:  Pressure Injury 10/14/23 Perineum Medial Stage 1 -  Intact skin with non-blanchable redness of a localized area usually over a bony prominence. redden butoock bilat and mid [place] (Active)  10/14/23 1540  Location: Perineum  Location Orientation: Medial  Staging: Stage 1 -  Intact skin with non-blanchable redness of a localized area usually over a bony prominence.  Wound Description (Comments): redden butoock bilat and mid ota  Present on Admission: No  Dressing Type Foam - Lift dressing to assess site every shift 10/21/23 0800     Nutrition Problem: Moderate Malnutrition Etiology: chronic illness (heart failure, afib)   DVT prophylaxis:  SCDs Start: 09/20/23 0018 Place TED hose Start: 09/20/23 0018 apixaban (ELIQUIS) tablet 5 mg  Code Status: Full code Family Communication: At bedside Disposition Plan: Skilled nursing facility Status is: Inpatient Remains inpatient appropriate because: Remains on TPN    Antimicrobials:  Anti-infectives (From admission, onward)    Start     Dose/Rate Route Frequency Ordered Stop   10/11/23 1600  Ampicillin-Sulbactam (UNASYN) 3 g in sodium chloride 0.9 % 100 mL IVPB        3 g 200 mL/hr over 30 Minutes Intravenous Every 6 hours 10/11/23 1211 10/14/23 2141   10/07/23 2200  Ampicillin-Sulbactam (UNASYN) 3 g in sodium chloride 0.9 % 100 mL IVPB  Status:  Discontinued        3 g 200 mL/hr over 30 Minutes Intravenous Every 12 hours 10/07/23 1215 10/11/23 1211   10/05/23 0000  cefOXitin (MEFOXIN) 2 g in sodium chloride 0.9 % 100 mL IVPB  Status:  Discontinued        2 g 200 mL/hr over 30 Minutes Intravenous To Radiology 10/04/23 1450 10/04/23 1455   10/04/23 1545   cefOXitin (MEFOXIN) 2 g in sodium chloride 0.9 % 100 mL IVPB  Status:  Discontinued        2 g 200 mL/hr over 30 Minutes Intravenous To Radiology 10/04/23 1455 10/04/23 1718   10/03/23 0600  cefTRIAXone (ROCEPHIN) 2 g in sodium chloride 0.9 % 100 mL IVPB  Status:  Discontinued       Note to Pharmacy: Start after blood cultures drawn   2 g 200 mL/hr over 30 Minutes Intravenous Every 24 hours 10/03/23 0514 10/07/23 1215   10/03/23 0600  metroNIDAZOLE (FLAGYL) tablet 500 mg  Status:  Discontinued        500 mg Oral Every 12 hours 10/03/23 0514 10/05/23 0837        Objective: Vitals:   10/21/23 0321 10/21/23 0500 10/21/23 0800 10/21/23 1135  BP: (!) 144/88  (!) 141/96 (!) 142/95  Pulse: (!) 109  90 98  Resp: 17  18 18   Temp: 97.9 F (36.6 C)   97.7 F (36.5 C)  TempSrc: Oral   Axillary  SpO2: 97%  97% 100%  Weight:  86 kg    Height:        Intake/Output Summary (Last 24 hours) at 10/21/2023 1304 Last  data filed at 10/21/2023 1200 Gross per 24 hour  Intake 1388.01 ml  Output 3877 ml  Net -2488.99 ml   Filed Weights   10/18/23 0345 10/20/23 0312 10/21/23 0500  Weight: 88 kg 88 kg 86 kg    Examination:  General exam: Appears calm and comfortable  Respiratory system: Clear to auscultation. Respiratory effort normal. No respiratory distress. No conversational dyspnea.  Cardiovascular system: S1 & S2 heard.  Irregular rhythm.  No murmurs. No pedal edema. Gastrointestinal system: Abdomen is nondistended, soft and nontender. Normal bowel sounds heard. Central nervous system: Alert and oriented.  Extremities: Left lower extremity in brace Psychiatry: Judgement and insight appear normal. Mood & affect appropriate.   Data Reviewed: I have personally reviewed following labs and imaging studies  CBC: Recent Labs  Lab 10/15/23 0834 10/16/23 0440 10/17/23 0349 10/18/23 0535  WBC 10.0 10.4 8.9 8.8  NEUTROABS  --  8.4* 7.4 7.3  HGB 9.1* 9.2* 8.2* 8.2*  HCT 28.4* 28.6* 24.9*  25.8*  MCV 92.5 92.0 90.9 90.5  PLT 310 294 286 269   Basic Metabolic Panel: Recent Labs  Lab 10/16/23 0440 10/17/23 0349 10/18/23 0535 10/18/23 1815 10/19/23 0540 10/20/23 0329 10/21/23 0500  NA 141 134* 139 139 136 139 138  K 3.2* 3.5 2.8* 3.4* 3.2* 3.2* 3.4*  CL 104 100 104 106 108 106 107  CO2 31 28 30 26 22 25 25   GLUCOSE 101* 256* 112* 95 246* 109* 116*  BUN 24* 21 27* 37* 32* 36* 34*  CREATININE 1.69* 1.24 1.32* 1.32* 1.14 1.19 1.00  CALCIUM 8.0* 7.6* 8.0* 7.7* 6.8* 8.0* 8.1*  MG 1.8 2.1 1.7  --  1.8 2.1  --   PHOS 2.7 3.0 2.7 2.8 3.6  --   --    GFR: Estimated Creatinine Clearance: 63.9 mL/min (by C-G formula based on SCr of 1 mg/dL). Liver Function Tests: Recent Labs  Lab 10/16/23 0440 10/17/23 0349 10/18/23 0535 10/18/23 1815 10/19/23 0540  AST 21 22 17   --  13*  ALT 16 14 14   --  12  ALKPHOS 41 36* 38  --  32*  BILITOT 0.7 0.7 0.7  --  0.4  PROT 5.3* 4.8* 5.1*  --  4.2*  ALBUMIN 2.1* 2.0* 2.1* 2.0* 1.7*   No results for input(s): "LIPASE", "AMYLASE" in the last 168 hours. No results for input(s): "AMMONIA" in the last 168 hours. Coagulation Profile: No results for input(s): "INR", "PROTIME" in the last 168 hours. Cardiac Enzymes: No results for input(s): "CKTOTAL", "CKMB", "CKMBINDEX", "TROPONINI" in the last 168 hours. BNP (last 3 results) No results for input(s): "PROBNP" in the last 8760 hours. HbA1C: No results for input(s): "HGBA1C" in the last 72 hours. CBG: Recent Labs  Lab 10/19/23 1806 10/19/23 2204 10/20/23 0544 10/20/23 1152 10/20/23 1626  GLUCAP 99 98 101* 112* 112*   Lipid Profile: No results for input(s): "CHOL", "HDL", "LDLCALC", "TRIG", "CHOLHDL", "LDLDIRECT" in the last 72 hours.  Thyroid Function Tests: No results for input(s): "TSH", "T4TOTAL", "FREET4", "T3FREE", "THYROIDAB" in the last 72 hours. Anemia Panel: No results for input(s): "VITAMINB12", "FOLATE", "FERRITIN", "TIBC", "IRON", "RETICCTPCT" in the last 72  hours. Sepsis Labs: No results for input(s): "PROCALCITON", "LATICACIDVEN" in the last 168 hours.  No results found for this or any previous visit (from the past 240 hour(s)).    Radiology Studies: DG Abd 1 View  Result Date: 10/20/2023 CLINICAL DATA:  Ileus, diarrhea EXAM: ABDOMEN - 1 VIEW COMPARISON:  10/14/2023 FINDINGS: 2  supine frontal views of the abdomen and pelvis excludes the hemidiaphragms by collimation. Percutaneous cholecystostomy tube overlies right upper quadrant. No evidence of bowel obstruction. Decreased gaseous distention of the colon, with gas seen to the level of the rectum. No masses or abnormal calcifications. No acute bony abnormalities. IMPRESSION: 1. No evidence of bowel obstruction or ileus. Electronically Signed   By: Sharlet Salina M.D.   On: 10/20/2023 12:35      Scheduled Meds:  (feeding supplement) PROSource Plus  30 mL Oral QID   amiodarone  200 mg Oral Daily   amLODipine  10 mg Oral Daily   apixaban  5 mg Oral BID   brimonidine  1 drop Left Eye Daily   And   timolol  1 drop Left Eye Daily   buPROPion  300 mg Oral Daily   busPIRone  5 mg Oral BID   Chlorhexidine Gluconate Cloth  6 each Topical Q0600   cholecalciferol  1,000 Units Oral Daily   clopidogrel  75 mg Oral Daily   escitalopram  10 mg Oral Daily   fluticasone  2 spray Each Nare Daily   Gerhardt's butt cream   Topical QID   metoprolol succinate  50 mg Oral Daily   multivitamin with minerals  1 tablet Oral QHS   potassium chloride  40 mEq Oral Once   sacubitril-valsartan  1 tablet Oral Daily   sodium chloride flush  5 mL Intracatheter Q8H   cyanocobalamin  100 mcg Oral Daily   Continuous Infusions:  TPN (CLINIMIX-E) Adult     And   fat emul(SMOFlipid)     TPN (CLINIMIX-E) Adult 42 mL/hr at 10/21/23 0800     LOS: 31 days   Time spent: 25 minutes   Noralee Stain, DO [facility] 10/21/2023, 1:04 PM   Available via Epic secure chat 7am-7pm After these hours, please refer  to coverage provider listed on [email or web address]

## 2023-10-21 NOTE — Progress Notes (Addendum)
In and Out cath done by [facility]. got 700 ml  yellow urine with a pink tinge as the cath came out. Patient tol. Well. PVR was 216 with bladder scanner. This nurse attempted once and cath curled and was unable to insert it. Prior to [employer]. doing in and out cath

## 2023-10-21 NOTE — Progress Notes (Signed)
Patient unable to void. Feels like he has to but can't. Bladder scan 590 ml . In and out cath done with the assist of [facility]. got 650 ml of yellow urine PVR bladder scan iis 71 ml.

## 2023-10-21 NOTE — Progress Notes (Addendum)
PHARMACY - TOTAL PARENTERAL NUTRITION CONSULT NOTE   Indication: Prolonged ileus  Patient Measurements: Height: 5\' 10"  (177.8 cm) Weight: 86 kg (189 lb 9.5 oz) IBW/kg (Calculated) : 73 TPN AdjBW (KG): 85.3 Body mass index is 27.2 kg/m. Usual Weight: 88kg  Assessment:  [name] with prolonged admission s/p trauma. Pt had AKI and rhabdomyolysis which progressed to HD requirement. Now with some renal recovery, HD has been stopped. Pt underwent IR placement of cholecystostomy tube on 11/13. Pt now with adynamic ileus as demonstrated on imaging with poor PO intake. Pharmacy has been consulted for TPN to assist with meeting nutritional goals in setting of ileus.   11/29 Pt advanced to soft diet - pt reports taking [place] and creamed potaties at lunch and some amount of chicken salad at dinner. Also taking prosource supplements. Initiating calorie count which will result 12/2.  Glucose / Insulin: CBG<150. A.m. gluc >200 on 11/25 and 11/28 morning blood draw - ?accuracy of these draws - not drawing from central line since all others ok. No hx of DM, sSSI q8h. No SSI used. Electrolytes: K 3.4, Mg 2.1, CoCa 9.9, others wnl Renal: hx CKD - Scr 1 (BL of 1.5-1.8); BUN 20-30s *Required 2 sessions of HD this admission *Lasix 40mg  IV daily 11/26 >> 11/28 Hepatic: LFTs wnl, TG 78, albumin 2.1 Intake / Output; MIVF: UOP 1.3 ml/kg/hr; LBM 11/29 GI Imaging: 11/20 CTAP: adynamic colonic ileus, small bilateral pleural effusions 11/23 slight decrease in gaseous distention in stomach, bowel. Unchanged in colon. 11/29 Abd xray - no evidence of bowel obstruction or ileus  GI Surgeries / Procedures:  11/13 cholecystostomy tube placed  Central access: 11/25  TPN start date: 11/25  Nutritional Goals: Goal TPN rate is: Clinimix E 8/10 1L on x5 days (Mon/Wed/Thur/Fri/Sun)  and Clinimix E 8/10 2L x2 days (Tues/Sat) Plus SMOF Lipids at 20.15ml/hr every day Given concerns of volume overload this admission,  did not add additional dextrose fluids. And now with pt taking some po, will not add.  Electrolytes in Clinimix 8/10 E 1L bag: Na 35 mEq, K 30 mEq, Mag 5 mEq, Ca 4.5 mEq, Phos , Acetate 83, Cl 76 mEq Electrolytes in Clinimix 8/10 E 2L bag: Na , K , Mag , Ca , Phos , Ac , Cl 152 mEq.   Average kcal per day from all sources of parenteral nutrition: 1353 kcal (~71% of kcal needs), average protein per day: 103g (100% of AA needs)  RD Assessment: Estimated Needs Total Energy Estimated Needs: 1900-2100 Total Protein Estimated Needs: 95-110g Total Fluid Estimated Needs: >/=1.9L  Current Nutrition:  Soft diet >> pt did eat part of lunch and dinner yesterday TPN 11/25 >>  Prosource TID - accepting 3 per day (RD increased to 4/day 11/29 pm)  Plan:  Clinimix E 8/10 1L on x5 days (Mon/Wed/Thur/Fri/Sun)  and Clinimix E 8/10 2L x2 days (Tues/Sat) Plus SMOF Lipids at 20.85ml/hr every day Average daily kcal is 1353/day (~71% of needs) and 103 g protein (100% of needs) Taking PO meds - receiving MVI with minerals PO (MVI and trace elements taken out of TPN) MD ordered KCl 40 mEq po x 1. Will add another po in 4 hours Monitor TPN labs on Mon/Thurs, daily until stable - BMET in a.m. Monitor calorie count results and ability to wean TPN  Christoper Fabian, PharmD, BCPS Please see amion for complete clinical pharmacist phone list 10/21/2023 7:50 AM

## 2023-10-22 DIAGNOSIS — N1832 Chronic kidney disease, stage 3b: Secondary | ICD-10-CM | POA: Diagnosis not present

## 2023-10-22 DIAGNOSIS — N179 Acute kidney failure, unspecified: Secondary | ICD-10-CM | POA: Diagnosis not present

## 2023-10-22 LAB — BASIC METABOLIC PANEL
Anion gap: 5 (ref 5–15)
BUN: 40 mg/dL — ABNORMAL HIGH (ref 8–23)
CO2: 23 mmol/L (ref 22–32)
Calcium: 8.3 mg/dL — ABNORMAL LOW (ref 8.9–10.3)
Chloride: 110 mmol/L (ref 98–111)
Creatinine, Ser: 1.19 mg/dL (ref 0.61–1.24)
GFR, Estimated: >60 mL/min (ref >60)
Glucose, Bld: 108 mg/dL — ABNORMAL HIGH (ref 70–99)
Potassium: 3.9 mmol/L (ref 3.5–5.1)
Sodium: 138 mmol/L (ref 135–145)

## 2023-10-22 MED ORDER — FAT EMUL FISH OIL/PLANT BASED 20% (SMOFLIPID)IV EMUL
250.0000 mL | INTRAVENOUS | Status: AC
  Administered 2023-10-22: 250 mL via INTRAVENOUS
  Filled 2023-10-22: qty 250

## 2023-10-22 MED ORDER — CLINIMIX E/DEXTROSE (8/10) 8 % IV SOLN
INTRAVENOUS | Status: AC
  Filled 2023-10-22 (×2): qty 1000

## 2023-10-22 MED ORDER — POTASSIUM CHLORIDE CRYS ER 20 MEQ PO TBCR
20.0000 meq | EXTENDED_RELEASE_TABLET | Freq: Once | ORAL | Status: AC
  Administered 2023-10-22: 20 meq via ORAL
  Filled 2023-10-22: qty 1

## 2023-10-22 NOTE — Progress Notes (Signed)
PHARMACY - TOTAL PARENTERAL NUTRITION CONSULT NOTE   Indication: Prolonged ileus  Patient Measurements: Height: 5\' 10"  (177.8 cm) Weight: 88 kg (194 lb 0.1 oz) IBW/kg (Calculated) : 73 TPN AdjBW (KG): 85.3 Body mass index is 27.84 kg/m. Usual Weight: 88kg  Assessment:  [name] with prolonged admission s/p trauma. Pt had AKI and rhabdomyolysis which progressed to HD requirement. Now with some renal recovery, HD has been stopped. Pt underwent IR placement of cholecystostomy tube on 11/13. Pt now with adynamic ileus as demonstrated on imaging with poor PO intake. Pharmacy has been consulted for TPN to assist with meeting nutritional goals in setting of ileus.   11/29 Pt advanced to soft diet - pt reports taking [place] and creamed potaties at lunch and some amount of chicken salad at dinner. Also taking prosource supplements. Initiating calorie count which will result 12/2. 11/30 Pt taking ~25% of meals and prosource  Glucose / Insulin: CBG<150. A.m. gluc >200 on 11/25 and 11/28 morning blood draw - ?accuracy of these draws - all others ok. No hx of DM, SSI and CBG checks d/c. Electrolytes: K 3.9, Mg 2.1, CoCa 10.1, others wnl Renal: hx CKD - Scr 1.2 (BL of 1.5-1.8); BUN up to 40 *Required 2 sessions of HD this admission *Lasix 40mg  IV daily 11/26 >> 11/28 Hepatic: LFTs wnl, TG 78, albumin 2.1 Intake / Output; MIVF: UOP 0.8 ml/kg/hr; LBM 11/30 GI Imaging: 11/20 CTAP: adynamic colonic ileus, small bilateral pleural effusions 11/23 slight decrease in gaseous distention in stomach, bowel. Unchanged in colon. 11/29 Abd xray - no evidence of bowel obstruction or ileus  GI Surgeries / Procedures:  11/13 cholecystostomy tube placed  Central access: 11/25  TPN start date: 11/25  Nutritional Goals: Goal TPN rate is: Clinimix E 8/10 1L on x5 days (Mon/Wed/Thur/Fri/Sun) Clinimix E 8/10 2L x2 days (Tues/Sat)  Plus SMOF Lipids at 20.4ml/hr every day Given concerns of volume overload this  admission, did not add additional dextrose fluids. And now with pt taking po, will not add.  Electrolytes in Clinimix 8/10 E 1L bag: Na 35 mEq, K 30 mEq, Mag 5 mEq, Ca 4.5 mEq, Phos , Acetate 83, Cl 76 mEq Electrolytes in Clinimix 8/10 E 2L bag: Na , K , Mag , Ca , Phos , Ac , Cl 152 mEq.   Average kcal per day from all sources of parenteral nutrition: 1353 kcal (~71% of kcal needs), average protein per day: 103g (100% of AA needs)  RD Assessment: Estimated Needs Total Energy Estimated Needs: 1900-2100 Total Protein Estimated Needs: 95-110g Total Fluid Estimated Needs: >/=1.9L  Current Nutrition:  Soft diet >> pt did eat part of lunch and dinner yesterday TPN 11/25 >>  Prosource TID - accepting 4 per day   Plan:  Clinimix E 8/10 1L on x5 days (Mon/Wed/Thur/Fri/Sun)   Clinimix E 8/10 2L x2 days (Tues/Sat)  Plus SMOF Lipids at 20.51ml/hr every day Average daily kcal is 1353/day (~71% of needs) and 103 g protein (100% of needs) Taking PO meds - receiving MVI with minerals PO (MVI and trace elements taken out of TPN) KCl po today Monitor TPN labs on Mon/Thurs Monitor calorie count results on 12/2. Hopeful to wean TPN.  Christoper Fabian, PharmD, BCPS Please see amion for complete clinical pharmacist phone list 10/22/2023 7:37 AM

## 2023-10-22 NOTE — Progress Notes (Signed)
PROGRESS NOTE    Jorge West  NUU:[id number] DOB: 07-07-1946 DOA: 09/19/2023 PCP: Elizabeth Palau, FNP     Brief Narrative:  Jorge West is a 77 year old with history of CHF, A-fib, CAD, HTN, CKD, BPH presenting with left lower extremity trauma. He was reportedly changing a tire in the car/tire fell on his left leg. Per EMS report he was trapped for about 2 hours until his family found him and then was brought to [facility] emergency department for evaluation. Upon admission CT head, cervical spine negative, CT chest abdomen pelvis showed acute T12 compression fracture, CT of lower extremity showed asymmetric enlargement of muscle on the left.  Patient was admitted for left femoral condyle avulsion fracture, severe rhabdomyolysis and AKI on CKD.    Renal was consulted.  He required dialysis (x2 sessions - HD catheter removed 11/12).  His renal function has now improved and the temporary HD cath has been removed.     Hospitalization complicated by acute cholecystitis.  He's s/p perc chole tube.  He's now developed an ileus (see CT 11/20).  Due to his recent AKI on CKD, a central line was placed and he was started on TPN (11/25).     He's been started on lasix now for volume overload as well.     New events last 24 hours / Subjective: Doing well, no complaints.  Assessment & Plan:   Active Problems:   Acute kidney injury superimposed on CKD (HCC)   Rhabdomyolysis   Atrial fibrillation with RVR (HCC)   Essential hypertension   Acute on chronic diastolic CHF (congestive heart failure) (HCC)   Closed left femoral condyle avulsion fracture (HCC)   Coronary artery disease   Anemia of chronic disease   Malnutrition of moderate degree   Persistent atrial fibrillation (HCC)   Ileus (HCC)   Pressure injury of skin   Acute cholecystitis   Ileus -CT with diffuse colonic distension with multiple gas fluid levels c/w adynamic colonic ileus, gastric distension  -KUB with  interval decrease in gaseous distension of stomach and small bowel, persistent gaseous distension of the colon  -Repeat KUB showed no evidence of bowel obstruction or ileus  -Resolved -Calorie count in process.  Hopeful to discontinue TPN Monday if p.o. intake adequate -Flexi-Seal in place due to liquid stools.  Hopeful to discontinue as his oral intake improves and stools form  Acute cholecystitis status post cholecystostomy tube 11/13 -HIDA 11/13  positive for acute cholecystitis -CCS was consulted, given his comorbidities felt to be high risk for surgical intervention s/p IR cholecystostomy tube placed 11/13 -Gallbladder culture with Citrobacter -Ceftriaxone 11/11-11/15, flagyl 11/11-11/13 -Unasyn 11/16-11/23 -Follow-up with outpatient IR 6 to 8 weeks.  He will need Rx for flushes at discharge  AKI on CKD stage IIIa secondary to rhabdomyolysis, contrast nephrotoxicity -Status post dialysis -Nephrology has now signed off -Baseline creatinine 1.5-1.8 -Resolved  Traumatic rhabdomyolysis -Resolved  Acute on chronic diastolic CHF with volume overload, acute hypoxic respiratory failure -EF 40 to 45% -Entresto, metoprolol  Permanent A-fib RVR -Toprol, amiodarone, Eliquis  Communited minimally displaced tibial plateau fracture Nondisplaced fracture of the fibular head Mildly displaced cortical avulsion fracture at the MCL attachment site of the medial femoral condyle Lipohemarthrosis -Per ortho note 10/30, WBAT with ROM knee brace.  Dr. Aundria Rud to follow, may need reconstructive surgery down the road.     Anxiety and depression -Wellbutrin, Lexapro, Xanax as needed, BuSpar  Enterococcus faecalis UTI -Completed antibiotic treatment  CAD -Plavix  Acute blood  loss anemia with anemia of chronic disease -Status post 1 unit packed red blood cell 11/16 -Hemoglobin stable  Acute urinary retention -No report of prostate enlargement on CT pelvis.  Could be in setting of critical  illness and immobility over the past month.  Catheter placed 11/30    In agreement with assessment of the pressure ulcer as below:  Pressure Injury 10/14/23 Perineum Medial Stage 1 -  Intact skin with non-blanchable redness of a localized area usually over a bony prominence. redden butoock bilat and mid [place] (Active)  10/14/23 1540  Location: Perineum  Location Orientation: Medial  Staging: Stage 1 -  Intact skin with non-blanchable redness of a localized area usually over a bony prominence.  Wound Description (Comments): redden butoock bilat and mid ota  Present on Admission: No  Dressing Type Foam - Lift dressing to assess site every shift 10/21/23 2300     Nutrition Problem: Moderate Malnutrition Etiology: chronic illness (heart failure, afib)   DVT prophylaxis:  SCDs Start: 09/20/23 0018 Place TED hose Start: 09/20/23 0018 apixaban (ELIQUIS) tablet 5 mg  Code Status: Full code Family Communication: None at bedside Disposition Plan: Skilled nursing facility Status is: Inpatient Remains inpatient appropriate because: Remains on TPN, hopeful discharge to SNF 2-3 days    Antimicrobials:  Anti-infectives (From admission, onward)    Start     Dose/Rate Route Frequency Ordered Stop   10/11/23 1600  Ampicillin-Sulbactam (UNASYN) 3 g in sodium chloride 0.9 % 100 mL IVPB        3 g 200 mL/hr over 30 Minutes Intravenous Every 6 hours 10/11/23 1211 10/14/23 2141   10/07/23 2200  Ampicillin-Sulbactam (UNASYN) 3 g in sodium chloride 0.9 % 100 mL IVPB  Status:  Discontinued        3 g 200 mL/hr over 30 Minutes Intravenous Every 12 hours 10/07/23 1215 10/11/23 1211   10/05/23 0000  cefOXitin (MEFOXIN) 2 g in sodium chloride 0.9 % 100 mL IVPB  Status:  Discontinued        2 g 200 mL/hr over 30 Minutes Intravenous To Radiology 10/04/23 1450 10/04/23 1455   10/04/23 1545  cefOXitin (MEFOXIN) 2 g in sodium chloride 0.9 % 100 mL IVPB  Status:  Discontinued        2 g 200 mL/hr over 30  Minutes Intravenous To Radiology 10/04/23 1455 10/04/23 1718   10/03/23 0600  cefTRIAXone (ROCEPHIN) 2 g in sodium chloride 0.9 % 100 mL IVPB  Status:  Discontinued       Note to Pharmacy: Start after blood cultures drawn   2 g 200 mL/hr over 30 Minutes Intravenous Every 24 hours 10/03/23 0514 10/07/23 1215   10/03/23 0600  metroNIDAZOLE (FLAGYL) tablet 500 mg  Status:  Discontinued        500 mg Oral Every 12 hours 10/03/23 0514 10/05/23 0837        Objective: Vitals:   10/22/23 0344 10/22/23 0511 10/22/23 0814 10/22/23 1205  BP: 123/81  120/82 (!) 107/91  Pulse: 95 80 (!) 105 (!) 124  Resp: 15  20 20   Temp: 98.1 F (36.7 C)  97.6 F (36.4 C) 97.7 F (36.5 C)  TempSrc: Oral  Oral Oral  SpO2: 94% 96% 95% 94%  Weight:  88 kg    Height:        Intake/Output Summary (Last 24 hours) at 10/22/2023 1241 Last data filed at 10/22/2023 0517 Gross per 24 hour  Intake 1314.8 ml  Output 1605 ml  Net -  290.2 ml   Filed Weights   10/20/23 0312 10/21/23 0500 10/22/23 0511  Weight: 88 kg 86 kg 88 kg    Examination:  General exam: Appears calm and comfortable  Respiratory system: Clear to auscultation. Respiratory effort normal. No respiratory distress. No conversational dyspnea.  Cardiovascular system: S1 & S2 heard.  Irregular rhythm.  No murmurs. No pedal edema. Gastrointestinal system: Abdomen is nondistended, soft and nontender. Normal bowel sounds heard. Central nervous system: Alert and oriented.  Extremities: Left lower extremity in brace Psychiatry: Judgement and insight appear normal. Mood & affect appropriate.   Data Reviewed: I have personally reviewed following labs and imaging studies  CBC: Recent Labs  Lab 10/16/23 0440 10/17/23 0349 10/18/23 0535  WBC 10.4 8.9 8.8  NEUTROABS 8.4* 7.4 7.3  HGB 9.2* 8.2* 8.2*  HCT 28.6* 24.9* 25.8*  MCV 92.0 90.9 90.5  PLT 294 286 269   Basic Metabolic Panel: Recent Labs  Lab 10/16/23 0440 10/17/23 0349 10/18/23 0535  10/18/23 1815 10/19/23 0540 10/20/23 0329 10/21/23 0500 10/22/23 0520  NA 141 134* 139 139 136 139 138 138  K 3.2* 3.5 2.8* 3.4* 3.2* 3.2* 3.4* 3.9  CL 104 100 104 106 108 106 107 110  CO2 31 28 30 26 22 25 25 23   GLUCOSE 101* 256* 112* 95 246* 109* 116* 108*  BUN 24* 21 27* 37* 32* 36* 34* 40*  CREATININE 1.69* 1.24 1.32* 1.32* 1.14 1.19 1.00 1.19  CALCIUM 8.0* 7.6* 8.0* 7.7* 6.8* 8.0* 8.1* 8.3*  MG 1.8 2.1 1.7  --  1.8 2.1  --   --   PHOS 2.7 3.0 2.7 2.8 3.6  --   --   --    GFR: Estimated Creatinine Clearance: 58.1 mL/min (by C-G formula based on SCr of 1.19 mg/dL). Liver Function Tests: Recent Labs  Lab 10/16/23 0440 10/17/23 0349 10/18/23 0535 10/18/23 1815 10/19/23 0540  AST 21 22 17   --  13*  ALT 16 14 14   --  12  ALKPHOS 41 36* 38  --  32*  BILITOT 0.7 0.7 0.7  --  0.4  PROT 5.3* 4.8* 5.1*  --  4.2*  ALBUMIN 2.1* 2.0* 2.1* 2.0* 1.7*   No results for input(s): "LIPASE", "AMYLASE" in the last 168 hours. No results for input(s): "AMMONIA" in the last 168 hours. Coagulation Profile: No results for input(s): "INR", "PROTIME" in the last 168 hours. Cardiac Enzymes: No results for input(s): "CKTOTAL", "CKMB", "CKMBINDEX", "TROPONINI" in the last 168 hours. BNP (last 3 results) No results for input(s): "PROBNP" in the last 8760 hours. HbA1C: No results for input(s): "HGBA1C" in the last 72 hours. CBG: Recent Labs  Lab 10/19/23 2204 10/20/23 0544 10/20/23 1152 10/20/23 1626 10/21/23 1725  GLUCAP 98 101* 112* 112* 100*   Lipid Profile: No results for input(s): "CHOL", "HDL", "LDLCALC", "TRIG", "CHOLHDL", "LDLDIRECT" in the last 72 hours.  Thyroid Function Tests: No results for input(s): "TSH", "T4TOTAL", "FREET4", "T3FREE", "THYROIDAB" in the last 72 hours. Anemia Panel: No results for input(s): "VITAMINB12", "FOLATE", "FERRITIN", "TIBC", "IRON", "RETICCTPCT" in the last 72 hours. Sepsis Labs: No results for input(s): "PROCALCITON", "LATICACIDVEN" in the  last 168 hours.  No results found for this or any previous visit (from the past 240 hour(s)).    Radiology Studies: No results found.    Scheduled Meds:  (feeding supplement) PROSource Plus  30 mL Oral QID   amiodarone  200 mg Oral Daily   amLODipine  10 mg Oral Daily  apixaban  5 mg Oral BID   brimonidine  1 drop Left Eye Daily   And   timolol  1 drop Left Eye Daily   buPROPion  300 mg Oral Daily   busPIRone  5 mg Oral BID   Chlorhexidine Gluconate Cloth  6 each Topical Q0600   cholecalciferol  1,000 Units Oral Daily   clopidogrel  75 mg Oral Daily   escitalopram  10 mg Oral Daily   fluticasone  2 spray Each Nare Daily   Gerhardt's butt cream   Topical QID   metoprolol succinate  50 mg Oral Daily   multivitamin with minerals  1 tablet Oral QHS   sacubitril-valsartan  1 tablet Oral Daily   sodium chloride flush  5 mL Intracatheter Q8H   cyanocobalamin  100 mcg Oral Daily   Continuous Infusions:  TPN (CLINIMIX-E) Adult     And   fat emul(SMOFlipid)     TPN (CLINIMIX-E) Adult 82 mL/hr at 10/22/23 0150     LOS: 32 days   Time spent: 25 minutes   Noralee Stain, DO [facility] 10/22/2023, 12:41 PM   Available via Epic secure chat 7am-7pm After these hours, please refer to coverage provider listed on [email or web address]

## 2023-10-22 NOTE — Plan of Care (Signed)
Foley remains in place for urinary retention. Patient assists with turning, knowledgeable about his medications and nursing procedures. Perineal area remains red but improving.

## 2023-10-23 ENCOUNTER — Encounter (HOSPITAL_COMMUNITY): Payer: Medicare PPO

## 2023-10-23 DIAGNOSIS — N1832 Chronic kidney disease, stage 3b: Secondary | ICD-10-CM | POA: Diagnosis not present

## 2023-10-23 DIAGNOSIS — N179 Acute kidney failure, unspecified: Secondary | ICD-10-CM | POA: Diagnosis not present

## 2023-10-23 LAB — COMPREHENSIVE METABOLIC PANEL
ALT: 11 U/L (ref 0–44)
AST: 13 U/L — ABNORMAL LOW (ref 15–41)
Albumin: 2.1 g/dL — ABNORMAL LOW (ref 3.5–5.0)
Alkaline Phosphatase: 40 U/L (ref 38–126)
Anion gap: 6 (ref 5–15)
BUN: 47 mg/dL — ABNORMAL HIGH (ref 8–23)
CO2: 22 mmol/L (ref 22–32)
Calcium: 8.5 mg/dL — ABNORMAL LOW (ref 8.9–10.3)
Chloride: 111 mmol/L (ref 98–111)
Creatinine, Ser: 1.15 mg/dL (ref 0.61–1.24)
GFR, Estimated: >60 mL/min (ref >60)
Glucose, Bld: 101 mg/dL — ABNORMAL HIGH (ref 70–99)
Potassium: 3.6 mmol/L (ref 3.5–5.1)
Sodium: 139 mmol/L (ref 135–145)
Total Bilirubin: 0.5 mg/dL (ref <1.2)
Total Protein: 5.6 g/dL — ABNORMAL LOW (ref 6.5–8.1)

## 2023-10-23 LAB — PHOSPHORUS: Phosphorus: 3.2 mg/dL (ref 2.5–4.6)

## 2023-10-23 LAB — TRIGLYCERIDES: Triglycerides: 80 mg/dL (ref <150)

## 2023-10-23 LAB — MAGNESIUM: Magnesium: 1.9 mg/dL (ref 1.7–2.4)

## 2023-10-23 MED ORDER — FAT EMUL FISH OIL/PLANT BASED 20% (SMOFLIPID)IV EMUL
250.0000 mL | INTRAVENOUS | Status: AC
  Administered 2023-10-23: 250 mL via INTRAVENOUS
  Filled 2023-10-23: qty 250

## 2023-10-23 MED ORDER — POTASSIUM CHLORIDE 20 MEQ PO PACK
60.0000 meq | PACK | Freq: Once | ORAL | Status: AC
  Administered 2023-10-23: 60 meq via ORAL
  Filled 2023-10-23: qty 3

## 2023-10-23 MED ORDER — SODIUM CHLORIDE 0.9 % IV SOLN: INTRAVENOUS | Status: AC

## 2023-10-23 MED ORDER — MEGESTROL ACETATE 40 MG PO TABS
40.0000 mg | ORAL_TABLET | Freq: Every day | ORAL | Status: DC
  Administered 2023-10-23 – 2023-10-26 (×4): 40 mg via ORAL
  Filled 2023-10-23 (×5): qty 1

## 2023-10-23 MED ORDER — BOOST / RESOURCE BREEZE PO LIQD CUSTOM
1.0000 | Freq: Three times a day (TID) | ORAL | Status: DC
  Administered 2023-10-24 – 2023-10-25 (×5): 1 via ORAL
  Filled 2023-10-23 (×2): qty 1

## 2023-10-23 MED ORDER — MAGNESIUM SULFATE 2 GM/50ML IV SOLN
2.0000 g | Freq: Once | INTRAVENOUS | Status: AC
  Administered 2023-10-23: 2 g via INTRAVENOUS
  Filled 2023-10-23: qty 50

## 2023-10-23 MED ORDER — CLINIMIX E/DEXTROSE (8/10) 8 % IV SOLN
INTRAVENOUS | Status: AC
Start: 2023-10-23 — End: 2023-10-24
  Filled 2023-10-23 (×2): qty 1000

## 2023-10-23 NOTE — Plan of Care (Signed)
  Problem: Education: Goal: Knowledge of General Education information will improve Description Including pain rating scale, medication(s)/side effects and non-pharmacologic comfort measures Outcome: Progressing   Problem: Health Behavior/Discharge Planning: Goal: Ability to manage health-related needs will improve Outcome: Progressing   

## 2023-10-23 NOTE — Progress Notes (Signed)
PHARMACY - TOTAL PARENTERAL NUTRITION CONSULT NOTE  Indication: Prolonged ileus  Patient Measurements: Height: 5\' 10"  (177.8 cm) Weight: 89.6 kg (197 lb 8.5 oz) IBW/kg (Calculated) : 73 TPN AdjBW (KG): 85.3 Body mass index is 28.34 kg/m. Usual Weight: 88kg  Assessment:  [name] with prolonged admission s/p trauma. Pt had AKI and rhabdomyolysis which progressed to HD requirement. Some renal recovery and HD has been stopped. Pt underwent IR placement of cholecystostomy tube on 11/13. Pt now with adynamic ileus as demonstrated on imaging with poor PO intake. Pharmacy has been consulted for TPN to assist with meeting nutritional goals in setting of ileus.   Glucose / Insulin: no hx DM - CBG < 150.  SSI D/C'ed 10/20/23. Electrolytes: K 3.6 (goal >/= 4), Mag 1.9 (goal >/= 2), others WNL Renal: hx CKD - SCr 1.15, BUN up to 47 *Required 2 sessions of HD this admission *Lasix 40mg  IV daily 11/26 >> 11/28 Hepatic: LFTs / tbili / TG WNL, albumin 2.1 Intake / Output; MIVF: UOP 0.8 ml/kg/hr; LBM 11/30 GI Imaging: 11/20 CT: adynamic colonic ileus, small B/L pleural effusions 11/23 slight decrease in gaseous distention in stomach, bowel. Unchanged in colon. 11/29 Abd xray - no evidence of bowel obstruction or ileus  GI Surgeries / Procedures:  11/13 cholecystostomy tube placed  Central access: 10/16/23 TPN start date: 10/16/23  Nutritional Goals: Goal TPN rate is: Clinimix E 8/10 1L on x5 days (Mon/Wed/Thur/Fri/Sun) Clinimix E 8/10 2L x2 days (Tues/Sat)  Plus SMOF Lipids at 20.48ml/hr every day Given concerns of volume overload this admission, did not add additional dextrose fluids. And now with pt taking po, will not add.  Electrolytes in Clinimix 8/10 E 1L bag: Na 35 mEq, K 30 mEq, Mag 5 mEq, Ca 4.5 mEq, Phos , Acetate 83, Cl 76 mEq  RD Estimated Needs Total Energy Estimated Needs: 1900-2100 Total Protein Estimated Needs: 95-110g Total Fluid Estimated Needs:  >/=1.9L  Current Nutrition:  TPN Soft diet - ate ~30% of meals.  Picky eater, early satiety, abd tightness  Prosource TID - accepting 4 per day   Plan:  Clinimix E 8/10 1L on 5 days per week (Mon/Wed/Thur/Fri/Sun)   Clinimix E 8/10 2L 2 days  per week (Tues/Sat)  Plus SMOFlipids at 20.8 ml/hr x 12 hrs every day TPN provides an average of 1353 kCal (~71% of needs) and 103g protein (100% of needs) per day PO MVI with minerals daily (no MVI and trace elements in TPN) Daily Vit D and B12 per MD KCL PO x 1 Mag sulfate 2gm IV Monitor TPN labs on Mon/Thurs - labs in AM F/u calorie count result  Itai Barbian D. Laney Potash, PharmD, BCPS, BCCCP 10/23/2023, 9:15 AM

## 2023-10-23 NOTE — TOC Progression Note (Signed)
Transition of Care [facility]) - Progression Note    Patient Details  Name: Jorge West MRN: [id number] Date of Birth: [birth year]  [facility] CM/SW Contact  Erin Sons, [place] Phone Number: 10/23/2023, 1:48 PM  Clinical Narrative:     CSW called and left voicemail with Spouse requesting return call regarding SNF choice.   1530 CSW called pt's spouse again. Discussed SNF bed offers. Spouse states they would like to go with [facility] SNF. CSW explained SNF auth process.   CSW confirmed bed with [facility] though was informed they have covid outbreak.  CSW called spouse to notify her of this. Spouse is unsure of choice now. She will likely call [facility] to discuss logistics if they move forward with them and discuss with family as needed.   TOC will follow for final decision and will need to initiate SNF authorization.   Expected Discharge Plan: Skilled Nursing Facility Barriers to Discharge: Insurance Authorization        Social Determinants of Health (SDOH) Interventions SDOH Screenings   Food Insecurity: No Food Insecurity (09/20/2023)  Housing: Low Risk  (09/20/2023)  Transportation Needs: No Transportation Needs (09/20/2023)  Utilities: Not At Risk (09/20/2023)  Depression (PHQ2-9): Medium Risk (09/05/2023)  Financial Resource Strain: Low Risk  (04/24/2023)   Received from [facility], [facility]  Physical Activity: Inactive (04/24/2023)   Received from [facility], [facility]  Social Connections: Moderately Integrated (04/24/2023)   Received from [facility], [facility]  Stress: No Stress Concern Present (07/18/2023)   Received from [facility]  Tobacco Use: Medium Risk (09/20/2023)    Readmission Risk Interventions     No data to display

## 2023-10-23 NOTE — Progress Notes (Addendum)
  Calorie Count Note  48 hour calorie count ordered.  Diet: Soft diet, thin liquids  Supplements: ProSource Plus, Magic cup, snacks   11/30 Breakfast: 0 kcal, 0 gm protein  Lunch: 320 kcal, 4 gm protein  Dinner: 148 kcal, 4 gm protein  Supplements: 200 kcal, 30 gm protein   Total intake: 668 kcal (35% of minimum estimated needs)  38 protein (40% of minimum estimated needs)  12/1: Breakfast: 300 kcal, 8 gm protein  Lunch: 80 kcal, 0 gm protein  Dinner: 115 kcal, 2 gm protein  Supplements: 400 kcal, 60 gm protein   Total intake: 895  kcal (47% of minimum estimated needs)  70 gm  protein (74% of minimum estimated needs)  2 Day average: 41% kcal and 57% protein needs   Brief Note:  TPN still running and providing 1353 kCal (~71% of needs) and 103g protein (100% of needs).  Pt is still consuming very minimal intake. He reports having no appetite and feeling full. Pt does not like Ensures because they gave him diarrhea, wanted to try Boost Breeze again. Pt also does not like Magic cup, will discontinue. Pt had juices, snacks, and protein shakes on ledge.  Discussed with pt the option to trial an appetite stimulant, MD in agreement and will trial megace.   NUTRITION DIAGNOSIS:    Moderate Malnutrition related to chronic illness (heart failure, afib) as evidenced by mild fat depletion, mild muscle depletion. - Ongoing   GOAL:    Patient will meet greater than or equal to 90% of their needs - goal met via TPN; diet now advanced to GI soft  INTERVENTION:  Continue TPN Continue 30 ml ProSource Plus QID, each supplement provides 100 kcals and 15 grams protein.  Discontinue Magic cup TID with meals, each supplement provides 290 kcal and 9 grams of protein Continue Snacks TID between meals  Elliot Dally, RD Registered Dietitian  See Amion for more information
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
## 2023-10-24 DIAGNOSIS — N1832 Chronic kidney disease, stage 3b: Secondary | ICD-10-CM | POA: Diagnosis not present

## 2023-10-24 DIAGNOSIS — N179 Acute kidney failure, unspecified: Secondary | ICD-10-CM | POA: Diagnosis not present

## 2023-10-24 LAB — BASIC METABOLIC PANEL
Anion gap: 6 (ref 5–15)
BUN: 46 mg/dL — ABNORMAL HIGH (ref 8–23)
CO2: 20 mmol/L — ABNORMAL LOW (ref 22–32)
Calcium: 8.4 mg/dL — ABNORMAL LOW (ref 8.9–10.3)
Chloride: 110 mmol/L (ref 98–111)
Creatinine, Ser: 1.18 mg/dL (ref 0.61–1.24)
GFR, Estimated: >60 mL/min (ref >60)
Glucose, Bld: 100 mg/dL — ABNORMAL HIGH (ref 70–99)
Potassium: 3.6 mmol/L (ref 3.5–5.1)
Sodium: 136 mmol/L (ref 135–145)

## 2023-10-24 LAB — MAGNESIUM: Magnesium: 2.1 mg/dL (ref 1.7–2.4)

## 2023-10-24 MED ORDER — POTASSIUM CHLORIDE CRYS ER 20 MEQ PO TBCR
40.0000 meq | EXTENDED_RELEASE_TABLET | Freq: Once | ORAL | Status: AC
  Administered 2023-10-24: 40 meq via ORAL
  Filled 2023-10-24: qty 2

## 2023-10-24 MED ORDER — FAT EMUL FISH OIL/PLANT BASED 20% (SMOFLIPID)IV EMUL
250.0000 mL | INTRAVENOUS | Status: AC
  Administered 2023-10-24: 250 mL via INTRAVENOUS
  Filled 2023-10-24: qty 250

## 2023-10-24 MED ORDER — POTASSIUM CHLORIDE 10 MEQ/50ML IV SOLN
10.0000 meq | INTRAVENOUS | Status: AC
  Administered 2023-10-24 (×3): 10 meq via INTRAVENOUS
  Filled 2023-10-24 (×3): qty 50

## 2023-10-24 MED ORDER — CLINIMIX E/DEXTROSE (8/10) 8 % IV SOLN
INTRAVENOUS | Status: AC
  Filled 2023-10-24 (×2): qty 2000

## 2023-10-24 NOTE — TOC Progression Note (Signed)
Transition of Care [employer]) - Progression Note    Patient Details  Name: Jorge West MRN: [id number] Date of Birth: Apr 22, 1946  Transition of Care [facility]) CM/SW Contact  Eduard Roux, [place] Phone Number: 10/24/2023, 11:37 AM  Clinical Narrative:     Patient received insurance approval # [id number] 12/3-12/5, For [facility]-   Barriers to Discharge: patient remains on TPN & w/ flexiseal.   SNF- updated   TOC will continue to follow and will assist with discharge planning.  Antony Blackbird, MSW, LCSW Clinical Social Worker     Expected Discharge Plan and Services                                               Social Determinants of Health (SDOH) Interventions SDOH Screenings   Food Insecurity: No Food Insecurity (09/20/2023)  Housing: Low Risk  (09/20/2023)  Transportation Needs: No Transportation Needs (09/20/2023)  Utilities: Not At Risk (09/20/2023)  Depression (PHQ2-9): Medium Risk (09/05/2023)  Financial Resource Strain: Low Risk  (04/24/2023)   Received from [facility], [facility]  Physical Activity: Inactive (04/24/2023)   Received from [facility], [facility]  Social Connections: Moderately Integrated (04/24/2023)   Received from [facility], [facility]  Stress: No Stress Concern Present (07/18/2023)   Received from [facility]  Tobacco Use: Medium Risk (09/20/2023)    Readmission Risk Interventions     No data to display

## 2023-10-24 NOTE — Care Management Important Message (Signed)
Important Message  Patient Details  Name: KADARI FIER MRN: [id number] Date of Birth: 17-Dec-1945   Important Message Given:  Yes - Medicare IM     Sherilyn Banker 10/24/2023, 2:24 PM
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
## 2023-10-24 NOTE — Plan of Care (Signed)

## 2023-10-25 ENCOUNTER — Encounter (HOSPITAL_COMMUNITY): Payer: Medicare PPO

## 2023-10-25 DIAGNOSIS — N179 Acute kidney failure, unspecified: Secondary | ICD-10-CM | POA: Diagnosis not present

## 2023-10-25 DIAGNOSIS — N1832 Chronic kidney disease, stage 3b: Secondary | ICD-10-CM | POA: Diagnosis not present

## 2023-10-25 LAB — BASIC METABOLIC PANEL
Anion gap: 4 — ABNORMAL LOW (ref 5–15)
Anion gap: 4 — ABNORMAL LOW (ref 5–15)
BUN: 48 mg/dL — ABNORMAL HIGH (ref 8–23)
BUN: 49 mg/dL — ABNORMAL HIGH (ref 8–23)
CO2: 19 mmol/L — ABNORMAL LOW (ref 22–32)
CO2: 20 mmol/L — ABNORMAL LOW (ref 22–32)
Calcium: 8.5 mg/dL — ABNORMAL LOW (ref 8.9–10.3)
Calcium: 8.5 mg/dL — ABNORMAL LOW (ref 8.9–10.3)
Chloride: 108 mmol/L (ref 98–111)
Chloride: 112 mmol/L — ABNORMAL HIGH (ref 98–111)
Creatinine, Ser: 1.2 mg/dL (ref 0.61–1.24)
Creatinine, Ser: 1.23 mg/dL (ref 0.61–1.24)
GFR, Estimated: >60 mL/min (ref >60)
GFR, Estimated: >60 mL/min (ref >60)
Glucose, Bld: 431 mg/dL — ABNORMAL HIGH (ref 70–99)
Glucose, Bld: 108 mg/dL — ABNORMAL HIGH (ref 70–99)
Potassium: 5.6 mmol/L — ABNORMAL HIGH (ref 3.5–5.1)
Potassium: 4.8 mmol/L (ref 3.5–5.1)
Sodium: 131 mmol/L — ABNORMAL LOW (ref 135–145)
Sodium: 136 mmol/L (ref 135–145)

## 2023-10-25 LAB — GLUCOSE, CAPILLARY
Glucose-Capillary: 109 mg/dL — ABNORMAL HIGH (ref 70–99)
Glucose-Capillary: 104 mg/dL — ABNORMAL HIGH (ref 70–99)
Glucose-Capillary: 106 mg/dL — ABNORMAL HIGH (ref 70–99)

## 2023-10-25 MED ORDER — CLINIMIX E/DEXTROSE (8/10) 8 % IV SOLN
INTRAVENOUS | Status: AC
  Filled 2023-10-25 (×2): qty 1000

## 2023-10-25 MED ORDER — FAT EMUL FISH OIL/PLANT BASED 20% (SMOFLIPID)IV EMUL
250.0000 mL | INTRAVENOUS | Status: AC
  Administered 2023-10-25: 250 mL via INTRAVENOUS
  Filled 2023-10-25: qty 250
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
## 2023-10-25 NOTE — Plan of Care (Signed)
  Problem: Clinical Measurements: Goal: Ability to maintain clinical measurements within normal limits will improve Outcome: Progressing   Problem: Health Behavior/Discharge Planning: Goal: Ability to manage health-related needs will improve Outcome: Progressing   Problem: Health Behavior/Discharge Planning: Goal: Ability to manage health-related needs will improve Outcome: Progressing   Problem: Education: Goal: Knowledge of General Education information will improve Description: Including pain rating scale, medication(s)/side effects and non-pharmacologic comfort measures Outcome: Progressing   Problem: Clinical Measurements: Goal: Diagnostic test results will improve Outcome: Progressing   Problem: Nutrition: Goal: Adequate nutrition will be maintained Outcome: Progressing   Problem: Activity: Goal: Risk for activity intolerance will decrease Outcome: Progressing   Problem: Pain Management: Goal: General experience of comfort will improve Outcome: Progressing
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
## 2023-10-26 DIAGNOSIS — N1832 Chronic kidney disease, stage 3b: Secondary | ICD-10-CM | POA: Diagnosis not present

## 2023-10-26 DIAGNOSIS — N179 Acute kidney failure, unspecified: Secondary | ICD-10-CM | POA: Diagnosis not present

## 2023-10-26 LAB — COMPREHENSIVE METABOLIC PANEL
ALT: 14 U/L (ref 0–44)
AST: 14 U/L — ABNORMAL LOW (ref 15–41)
Albumin: 2.1 g/dL — ABNORMAL LOW (ref 3.5–5.0)
Alkaline Phosphatase: 51 U/L (ref 38–126)
Anion gap: 7 (ref 5–15)
BUN: 44 mg/dL — ABNORMAL HIGH (ref 8–23)
CO2: 19 mmol/L — ABNORMAL LOW (ref 22–32)
Calcium: 8.5 mg/dL — ABNORMAL LOW (ref 8.9–10.3)
Chloride: 110 mmol/L (ref 98–111)
Creatinine, Ser: 1.3 mg/dL — ABNORMAL HIGH (ref 0.61–1.24)
GFR, Estimated: 57 mL/min — ABNORMAL LOW (ref >60)
Glucose, Bld: 96 mg/dL (ref 70–99)
Potassium: 3.9 mmol/L (ref 3.5–5.1)
Sodium: 136 mmol/L (ref 135–145)
Total Bilirubin: 0.5 mg/dL (ref <1.2)
Total Protein: 5.3 g/dL — ABNORMAL LOW (ref 6.5–8.1)

## 2023-10-26 LAB — GLUCOSE, CAPILLARY
Glucose-Capillary: 108 mg/dL — ABNORMAL HIGH (ref 70–99)
Glucose-Capillary: 101 mg/dL — ABNORMAL HIGH (ref 70–99)
Glucose-Capillary: 114 mg/dL — ABNORMAL HIGH (ref 70–99)

## 2023-10-26 LAB — MAGNESIUM: Magnesium: 1.9 mg/dL (ref 1.7–2.4)

## 2023-10-26 LAB — PHOSPHORUS: Phosphorus: 3.2 mg/dL (ref 2.5–4.6)

## 2023-10-26 MED ORDER — MAGNESIUM SULFATE 2 GM/50ML IV SOLN
2.0000 g | Freq: Once | INTRAVENOUS | Status: AC
  Administered 2023-10-26: 2 g via INTRAVENOUS
  Filled 2023-10-26: qty 50

## 2023-10-26 MED ORDER — BOOST PLUS PO LIQD
237.0000 mL | Freq: Two times a day (BID) | ORAL | Status: DC
  Administered 2023-10-27 – 2023-10-31 (×8): 237 mL via ORAL
  Filled 2023-10-26 (×12): qty 237

## 2023-10-26 MED ORDER — FAT EMUL FISH OIL/PLANT BASED 20% (SMOFLIPID)IV EMUL
250.0000 mL | INTRAVENOUS | Status: DC
  Filled 2023-10-26: qty 250

## 2023-10-26 MED ORDER — CLINIMIX E/DEXTROSE (8/10) 8 % IV SOLN
INTRAVENOUS | Status: AC
  Filled 2023-10-26 (×2): qty 1000

## 2023-10-26 MED ORDER — FAT EMUL FISH OIL/PLANT BASED 20% (SMOFLIPID)IV EMUL
125.0000 mL | INTRAVENOUS | Status: AC
  Administered 2023-10-26: 125 mL via INTRAVENOUS
  Filled 2023-10-26: qty 250

## 2023-10-26 MED ORDER — CLINIMIX E/DEXTROSE (8/10) 8 % IV SOLN
INTRAVENOUS | Status: DC
  Filled 2023-10-26: qty 1000

## 2023-10-26 MED ORDER — POTASSIUM CHLORIDE CRYS ER 20 MEQ PO TBCR
30.0000 meq | EXTENDED_RELEASE_TABLET | Freq: Once | ORAL | Status: AC
  Administered 2023-10-26: 30 meq via ORAL
  Filled 2023-10-26: qty 1
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
## 2023-10-26 NOTE — Progress Notes (Signed)
Patient's blood pressure was 76/61 at 76/61, when he was sitting on the chair and he was not comfortable and was having a left knee pain so transferred from the chair to bed, comfort measures provided and checked B/P again at 1219,B/P increased to 97/66, he denied knee pain after repositioning him on the bed.

## 2023-10-26 NOTE — Progress Notes (Signed)
PROGRESS NOTE    Jorge West  ZOX:[id number] DOB: 1946-08-25 DOA: 09/19/2023 PCP: Elizabeth Palau, FNP     Brief Narrative:  [place] LUTH is a 77 year old with history of CHF, A-fib, CAD, HTN, CKD, BPH presenting with left lower extremity trauma. Per EMS report he was trapped for about 2 hours until his family found him and then was brought to the hospital. Upon admission CT head, cervical spine negative, CT chest abdomen pelvis showed acute T12 compression fracture, CT of lower extremity showed asymmetric enlargement of muscle on the left.  Patient was admitted for left femoral condyle avulsion fracture, severe rhabdomyolysis and AKI on CKD. Renal was consulted.  He required dialysis (x2 sessions - HD catheter removed 11/12).  His renal function has now improved and the temporary HD cath has been removed. Hospitalization complicated by acute cholecystitis.  He's s/p perc chole tube.  He's now developed an ileus (see CT 11/20).  Due to his recent AKI on CKD, a central line was placed and he was started on TPN (11/25).       New events last 24 hours / Subjective: Patient continues to have poor appetite, has been on TPN over 10 days.  Plan is to wean off TPN and see if patient can improve his oral intake.  Discussed with dietitian and pharmacy regarding TPN and patient's overall diet.    Assessment & Plan:   Active Problems:   Acute kidney injury superimposed on CKD (HCC)   Rhabdomyolysis   Atrial fibrillation with RVR (HCC)   Essential hypertension   Acute on chronic diastolic CHF (congestive heart failure) (HCC)   Closed left femoral condyle avulsion fracture (HCC)   Coronary artery disease   Anemia of chronic disease   Malnutrition of moderate degree   Persistent atrial fibrillation (HCC)   Ileus (HCC)   Pressure injury of skin   Acute cholecystitis   Ileus Resolved CT with diffuse colonic distension with multiple gas fluid levels c/w adynamic colonic ileus,  gastric distension  Calorie count in process.  Hopeful to discontinue TPN Flexi-Seal in place due to liquid stools.  Hopeful to discontinue as his oral intake improves and stools form Megace started for appetite stimulant  Acute cholecystitis status post cholecystostomy tube 11/13 HIDA 11/13  positive for acute cholecystitis [facility] was consulted, given his comorbidities felt to be high risk for surgical intervention s/p IR cholecystostomy tube placed 11/13 Gallbladder culture with Citrobacter, completed antibiotics Follow-up with outpatient IR 6 to 8 weeks.  He will need Rx for flushes at discharge  AKI on CKD stage IIIa secondary to rhabdomyolysis, contrast nephrotoxicity Resolved Baseline creatinine 1.5-1.8 Status post dialysis Nephrology has now signed off  Traumatic rhabdomyolysis Resolved  Acute on chronic diastolic CHF with volume overload, acute hypoxic respiratory failure Resolved EF 40 to 45% Entresto, metoprolol  Permanent A-fib RVR Toprol, amiodarone, Eliquis  Communited minimally displaced tibial plateau fracture Nondisplaced fracture of the fibular head Mildly displaced cortical avulsion fracture at the MCL attachment site of the medial femoral condyle Lipohemarthrosis Per ortho note 10/30, WBAT with ROM knee brace.  Dr. Aundria Rud to follow, may need reconstructive surgery down the road.     Anxiety and depression Wellbutrin, Lexapro, Xanax as needed, BuSpar  Enterococcus faecalis UTI Completed antibiotic treatment  CAD Plavix  Acute blood loss anemia with anemia of chronic disease Status post 1 unit packed red blood cell 11/16 Hemoglobin stable  Acute urinary retention No report of prostate enlargement on CT pelvis.  Could  be in setting of critical illness and immobility over the past month.  Catheter placed 11/30    In agreement with assessment of the pressure ulcer as below:  Pressure Injury 10/14/23 Perineum Medial Stage 1 -  Intact skin with  non-blanchable redness of a localized area usually over a bony prominence. redden butoock bilat and mid [place] (Active)  10/14/23 1540  Location: Perineum  Location Orientation: Medial  Staging: Stage 1 -  Intact skin with non-blanchable redness of a localized area usually over a bony prominence.  Wound Description (Comments): redden butoock bilat and mid ota  Present on Admission: No  Dressing Type Foam - Lift dressing to assess site every shift 10/26/23 1800     Nutrition Problem: Moderate Malnutrition Etiology: chronic illness (heart failure, afib)   DVT prophylaxis:  Place TED hose Start: 10/23/23 1241 SCDs Start: 09/20/23 0018 Place TED hose Start: 09/20/23 0018 apixaban (ELIQUIS) tablet 5 mg   Code Status: Full code Family Communication: Spouse at bedside Disposition Plan: Skilled nursing facility Status is: Inpatient Remains inpatient appropriate because: level of care   Antimicrobials:  Anti-infectives (From admission, onward)    Start     Dose/Rate Route Frequency Ordered Stop   10/11/23 1600  Ampicillin-Sulbactam (UNASYN) 3 g in sodium chloride 0.9 % 100 mL IVPB        3 g 200 mL/hr over 30 Minutes Intravenous Every 6 hours 10/11/23 1211 10/14/23 2141   10/07/23 2200  Ampicillin-Sulbactam (UNASYN) 3 g in sodium chloride 0.9 % 100 mL IVPB  Status:  Discontinued        3 g 200 mL/hr over 30 Minutes Intravenous Every 12 hours 10/07/23 1215 10/11/23 1211   10/05/23 0000  cefOXitin (MEFOXIN) 2 g in sodium chloride 0.9 % 100 mL IVPB  Status:  Discontinued        2 g 200 mL/hr over 30 Minutes Intravenous To Radiology 10/04/23 1450 10/04/23 1455   10/04/23 1545  cefOXitin (MEFOXIN) 2 g in sodium chloride 0.9 % 100 mL IVPB  Status:  Discontinued        2 g 200 mL/hr over 30 Minutes Intravenous To Radiology 10/04/23 1455 10/04/23 1718   10/03/23 0600  cefTRIAXone (ROCEPHIN) 2 g in sodium chloride 0.9 % 100 mL IVPB  Status:  Discontinued       Note to Pharmacy: Start after  blood cultures drawn   2 g 200 mL/hr over 30 Minutes Intravenous Every 24 hours 10/03/23 0514 10/07/23 1215   10/03/23 0600  metroNIDAZOLE (FLAGYL) tablet 500 mg  Status:  Discontinued        500 mg Oral Every 12 hours 10/03/23 0514 10/05/23 0837        Objective: Vitals:   10/26/23 0810 10/26/23 0814 10/26/23 1219 10/26/23 1550  BP: 98/81 98/62 97/66  103/75  Pulse: (!) 102 (!) 110 (!) 118 98  Resp:  18 19 18   Temp:  97.8 F (36.6 C) 97.6 F (36.4 C) 98.1 F (36.7 C)  TempSrc:  Oral Oral Oral  SpO2:  98%  98%  Weight:      Height:        Intake/Output Summary (Last 24 hours) at 10/26/2023 1917 Last data filed at 10/26/2023 1720 Gross per 24 hour  Intake 1447.09 ml  Output 2755 ml  Net -1307.91 ml   Filed Weights   10/23/23 0124 10/24/23 0400 10/25/23 0421  Weight: 89.6 kg 87 kg 92 kg    Examination:  General: NAD, deconditioned Cardiovascular: S1, S2  present Respiratory: CTAB Abdomen: Soft, nontender, nondistended, bowel sounds present Musculoskeletal: No bilateral pedal edema noted, LLE in brace Skin: Normal Psychiatry: Normal mood    Data Reviewed: I have personally reviewed following labs and imaging studies  CBC: No results for input(s): "WBC", "NEUTROABS", "HGB", "HCT", "MCV", "PLT" in the last 168 hours.  Basic Metabolic Panel: Recent Labs  Lab 10/20/23 0329 10/21/23 0500 10/23/23 0430 10/24/23 0325 10/25/23 0916 10/25/23 1051 10/26/23 0515  NA 139   < > 139 136 131* 136 136  K 3.2*   < > 3.6 3.6 5.6* 4.8 3.9  CL 106   < > 111 110 108 112* 110  CO2 25   < > 22 20* 19* 20* 19*  GLUCOSE 109*   < > 101* 100* 431* 108* 96  BUN 36*   < > 47* 46* 48* 49* 44*  CREATININE 1.19   < > 1.15 1.18 1.20 1.23 1.30*  CALCIUM 8.0*   < > 8.5* 8.4* 8.5* 8.5* 8.5*  MG 2.1  --  1.9 2.1  --   --  1.9  PHOS  --   --  3.2  --   --   --  3.2   < > = values in this interval not displayed.   GFR: Estimated Creatinine Clearance: 54.3 mL/min (A) (by C-G formula  based on SCr of 1.3 mg/dL (H)). Liver Function Tests: Recent Labs  Lab 10/23/23 0430 10/26/23 0515  AST 13* 14*  ALT 11 14  ALKPHOS 40 51  BILITOT 0.5 0.5  PROT 5.6* 5.3*  ALBUMIN 2.1* 2.1*   No results for input(s): "LIPASE", "AMYLASE" in the last 168 hours. No results for input(s): "AMMONIA" in the last 168 hours. Coagulation Profile: No results for input(s): "INR", "PROTIME" in the last 168 hours. Cardiac Enzymes: No results for input(s): "CKTOTAL", "CKMB", "CKMBINDEX", "TROPONINI" in the last 168 hours. BNP (last 3 results) No results for input(s): "PROBNP" in the last 8760 hours. HbA1C: No results for input(s): "HGBA1C" in the last 72 hours. CBG: Recent Labs  Lab 10/25/23 1601 10/25/23 2117 10/26/23 0616 10/26/23 1134 10/26/23 1552  GLUCAP 104* 106* 108* 101* 114*   Lipid Profile: No results for input(s): "CHOL", "HDL", "LDLCALC", "TRIG", "CHOLHDL", "LDLDIRECT" in the last 72 hours.   Thyroid Function Tests: No results for input(s): "TSH", "T4TOTAL", "FREET4", "T3FREE", "THYROIDAB" in the last 72 hours. Anemia Panel: No results for input(s): "VITAMINB12", "FOLATE", "FERRITIN", "TIBC", "IRON", "RETICCTPCT" in the last 72 hours. Sepsis Labs: No results for input(s): "PROCALCITON", "LATICACIDVEN" in the last 168 hours.  No results found for this or any previous visit (from the past 240 hour(s)).    Radiology Studies: No results found.    Scheduled Meds:  (feeding supplement) PROSource Plus  30 mL Oral QID   amiodarone  200 mg Oral Daily   apixaban  5 mg Oral BID   brimonidine  1 drop Left Eye Daily   And   timolol  1 drop Left Eye Daily   buPROPion  300 mg Oral Daily   busPIRone  5 mg Oral BID   Chlorhexidine Gluconate Cloth  6 each Topical Q0600   cholecalciferol  1,000 Units Oral Daily   clopidogrel  75 mg Oral Daily   escitalopram  10 mg Oral Daily   fluticasone  2 spray Each Nare Daily   Gerhardt's butt cream   Topical QID   [START ON  10/27/2023] lactose free nutrition  237 mL Oral BID BM   megestrol  40 mg Oral Daily   metoprolol succinate  50 mg Oral Daily   multivitamin with minerals  1 tablet Oral QHS   sacubitril-valsartan  1 tablet Oral Daily   sodium chloride flush  5 mL Intracatheter Q8H   cyanocobalamin  100 mcg Oral Daily   Continuous Infusions:  TPN (CLINIMIX-E) Adult 21 mL/hr at 10/26/23 1755   And   fat emul(SMOFlipid) 125 mL (10/26/23 1757)     LOS: 36 days     Briant Cedar, MD [facility] 10/26/2023, 7:17 PM   Available via Epic secure chat 7am-7pm After these hours, please refer to coverage provider listed on [email or web address]

## 2023-10-26 NOTE — Plan of Care (Signed)
  Problem: Clinical Measurements: Goal: Will remain free from infection Outcome: Progressing   Problem: Clinical Measurements: Goal: Diagnostic test results will improve Outcome: Progressing   Problem: Clinical Measurements: Goal: Respiratory complications will improve Outcome: Progressing   Problem: Nutrition: Goal: Adequate nutrition will be maintained Outcome: Progressing   Problem: Activity: Goal: Risk for activity intolerance will decrease Outcome: Progressing   Problem: Pain Management: Goal: General experience of comfort will improve Outcome: Progressing   Problem: Safety: Goal: Ability to remain free from injury will improve Outcome: Progressing   Problem: Skin Integrity: Goal: Risk for impaired skin integrity will decrease Outcome: Progressing

## 2023-10-26 NOTE — Progress Notes (Addendum)
Nutrition Follow-up  DOCUMENTATION CODES:   Non-severe (moderate) malnutrition in context of chronic illness  INTERVENTION:   -Weaning TPN per pharmacy order, to provide 50% today.   -Continue soft diet with encouragement + snacks TID between meals.  -Continue 30 mL ProSource Plus QID, each packet provides 100 calories, 15 gm protein.  -Provide Boost Plus 237 mL PO BID, each carton provides 360 calories, 14 gm protein.  -Discontinue Boost Breeze as he was not taking.   NUTRITION DIAGNOSIS:   Moderate Malnutrition related to chronic illness (heart failure, afib) as evidenced by mild fat depletion, mild muscle depletion.  -ongoing, addressing with oral supplements, TPN.  GOAL:   Patient will meet greater than or equal to 90% of their needs  -progressing  MONITOR:   PO intake, Labs, Weight trends, Skin  REASON FOR ASSESSMENT:   Follow up  ASSESSMENT:   Pt admitted with L femoral condyle avulsion fracture, T12 anterior wedge compression fracture sustained while he was changing a tire on his car and it fell on his leg. PMH significant for heart failure, afib, CAD, paroxysmal afib, HTN, CKD and BPH.  11/13 - acute cholecystitis, cholecystostomy tube placed, NPO then Clears 1814 11/14 - diet re-advanced to dysphagia 3   11/20 - CT abdomen indicating adynamic colonic ileus without obstruction 11/22 - diet advanced to full liquids 11/25 - initiation of TPN 11/28 - diet advanced to GI soft 11/29 - repeat abd xray- no evidence of bowel obstruction or ileus; FMS placed    Patient sleeping appears lethargic, per wife at bedside, he just received pain medication. This morning at B, consumed 100% frosted flakes cereal, 50 % of whole milk, 100% one packet of Prosource.  The day prior, 12/4, intakes recorded 80% x 2 meals L and D, with reported Boost Plus x 2 which would give him 1375 calories and 58 gm protein. Per wife, she does not find that his appetite has improved since  taking the megace. Previously patient has not been amenable to TF and then developed an ileus, TPN initiated and continues. Physician orders to wean TPN to half.  Patient states he is going to try eat well. Discussed possible supplemental TF if unable to make progress with PO intakes, unclear if patient is amenable. Current weight up 7.6 from most recent on 09/05/23, monitor. Medications reviewed and include cholecalciferol 25 mcg daily, megace initiated 12/2,   MVI/Minerals-1 Tab daily, potassium chloride 30 mEq once, vitamin B12, TPN, lipids, magnesium sulfate 2 gm IV.  Clinimix 8/10 1L on 5 days per week (Mon/Wed/Thur/Fri/Sun)   Clinimix  8/10 2L 2 days per week (Tues/Sat)  SMOFlipids at 20.8 ml/hr x 12 hrs every day TPN provides an average of 1353 kCal (~71% of needs) and 103g protein (100% of needs) per day  Labs: CBG 104-109 past 24 hrs, BUN 44, creatinine 1.30, GFR 57    Diet Order:   Diet Order             DIET SOFT Room service appropriate? Yes; Fluid consistency: Thin  Diet effective now                   EDUCATION NEEDS:   Education needs have been addressed  Skin:  Skin Assessment: Reviewed RN Assessment Skin Integrity Issues:: Stage I Stage I: medial perineum   Last BM:  10/26/23, type 7-medium  Height:   Ht Readings from Last 1 Encounters:  09/19/23 5\' 10"  (1.778 m)    Weight:  Wt Readings from Last 1 Encounters:  10/25/23 92 kg    Ideal Body Weight:  75.5 kg  BMI:  Body mass index is 29.1 kg/m.  Estimated Nutritional Needs:   Kcal:  1900-2100  Protein:  95-110g  Fluid:  >/=1.9L    Alvino Chapel, RDLD Clinical Dietitian See AMION for contact information

## 2023-10-26 NOTE — Progress Notes (Addendum)
PHARMACY - TOTAL PARENTERAL NUTRITION CONSULT NOTE  Indication: Prolonged ileus  Patient Measurements: Height: 5\' 10"  (177.8 cm) Weight: 92 kg (202 lb 13.2 oz) IBW/kg (Calculated) : 73 TPN AdjBW (KG): 85.3 Body mass index is 29.1 kg/m. Usual Weight: 88kg  Assessment:  [name] with prolonged admission s/p trauma. Pt had AKI and rhabdomyolysis which progressed to HD requirement. Some renal recovery and HD has been stopped. Pt underwent IR placement of cholecystostomy tube on 11/13. Pt now with adynamic ileus as demonstrated on imaging with poor PO intake. Pharmacy has been consulted for TPN to assist with meeting nutritional goals in setting of ileus.   11/30-12/1 calorie count:  consume 41% kCal and 57% protein; Megace added.  Glucose / Insulin: no hx DM - CBG < 150.  SSI D/C'ed 10/20/23. Electrolytes: normal CL and low CO2, K 3.9, Mag 1.9, others WNL Renal: hx CKD - SCr 1.3, BUN 40s *Required 2 sessions of HD this admission *Lasix 40mg  IV daily 11/26 >> 11/28 Hepatic: LFTs / tbili / TG WNL, albumin 2.1 Intake / Output; MIVF: UOP 1.1 ml/kg/hr; drain , stool not charted GI Imaging: 11/20 CT: adynamic colonic ileus, small B/L pleural effusions 11/23 slight decrease in gaseous distention in stomach, bowel. Unchanged in colon. 11/29 Abd xray - no evidence of bowel obstruction or ileus  GI Surgeries / Procedures:  11/13 cholecystostomy tube placed  Central access: 10/16/23 TPN start date: 10/16/23  Nutritional Goals: Goal TPN rate is: Clinimix E 8/10 1L on x5 days (Mon/Wed/Thur/Fri/Sun) Clinimix E 8/10 2L x2 days (Tues/Sat)  Plus SMOF Lipids at 20.71ml/hr every day Given concerns of volume overload this admission, did not add additional dextrose fluids. And now with pt taking po, will not add.  Electrolytes in Clinimix 8/10 E 1L bag: Na 35 mEq, K 30 mEq, Mag 5 mEq, Ca 4.5 mEq, Phos , Acetate 83, Cl 76 mEq  RD Estimated Needs Total Energy Estimated Needs:  1900-2100 Total Protein Estimated Needs: 95-110g Total Fluid Estimated Needs: >/=1.9L  Current Nutrition:  TPN Soft diet - ate ~80% of meals.  Picky eater, early satiety, abd tightness  Prosource TID - 4 charted given yesterday Boost TIDwm - 2 charted yesterday   Plan:  Clinimix E 8/10 1L on 5 days per week (Mon/Wed/Thur/Fri/Sun)   Clinimix E 8/10 2L 2 days per week (Tues/Sat)  SMOFlipids at 20.8 ml/hr x 12 hrs every day TPN provides an average of 1353 kCal (~71% of needs) and 103g protein (100% of needs) per day PO MVI with minerals daily (no MVI and trace elements in TPN) Daily Vit D and B12 per MD KCl 30 mEq PO x1 Mag 2 g IV x1 Monitor TPN labs on Mon/Thurs F/U PO intake with initiation of Megace on 12/2 - RD to f/u intake today  Thank you for involving pharmacy in this patient's care.  Loura Back, PharmD, BCPS Clinical Pharmacist Clinical phone for 10/26/2023 is [phone] 10/26/2023 7:13 AM   Addendum: Patient's bowel is functioning but reports feeling full with poor appetite. Discussed with Dr Sharolyn Douglas and will 1/2 TPN starting tonight with plan to wean tomorrow. Eber Jones, RD to revisit enteral feeds with patient/family.  Loura Back, PharmD, BCPS 2:05 PM

## 2023-10-27 ENCOUNTER — Encounter (HOSPITAL_COMMUNITY): Payer: Medicare PPO

## 2023-10-27 DIAGNOSIS — N1832 Chronic kidney disease, stage 3b: Secondary | ICD-10-CM | POA: Diagnosis not present

## 2023-10-27 DIAGNOSIS — N179 Acute kidney failure, unspecified: Secondary | ICD-10-CM | POA: Diagnosis not present

## 2023-10-27 LAB — GLUCOSE, CAPILLARY
Glucose-Capillary: 106 mg/dL — ABNORMAL HIGH (ref 70–99)
Glucose-Capillary: 108 mg/dL — ABNORMAL HIGH (ref 70–99)
Glucose-Capillary: 88 mg/dL (ref 70–99)
Glucose-Capillary: 93 mg/dL (ref 70–99)

## 2023-10-27 MED ORDER — PANTOPRAZOLE SODIUM 40 MG PO TBEC
40.0000 mg | DELAYED_RELEASE_TABLET | Freq: Every day | ORAL | Status: DC
  Administered 2023-10-27 – 2023-10-31 (×5): 40 mg via ORAL
  Filled 2023-10-27 (×5): qty 1

## 2023-10-27 MED ORDER — MEGESTROL ACETATE 400 MG/10ML PO SUSP
400.0000 mg | Freq: Every day | ORAL | Status: DC
  Administered 2023-10-27 – 2023-10-31 (×5): 400 mg via ORAL
  Filled 2023-10-27 (×7): qty 10

## 2023-10-27 MED ORDER — ROSUVASTATIN CALCIUM 20 MG PO TABS
40.0000 mg | ORAL_TABLET | Freq: Every day | ORAL | Status: DC
  Administered 2023-10-27 – 2023-10-31 (×5): 40 mg via ORAL
  Filled 2023-10-27 (×5): qty 2

## 2023-10-27 NOTE — Progress Notes (Signed)
PHARMACY - TOTAL PARENTERAL NUTRITION CONSULT NOTE  Indication: Prolonged ileus  Patient Measurements: Height: 5\' 10"  (177.8 cm) Weight: 89 kg (196 lb 3.4 oz) IBW/kg (Calculated) : 73 TPN AdjBW (KG): 85.3 Body mass index is 28.15 kg/m. Usual Weight: 88kg  Assessment:  [name] with prolonged admission s/p trauma. Pt had AKI and rhabdomyolysis which progressed to HD requirement. Some renal recovery and HD has been stopped. Pt underwent IR placement of cholecystostomy tube on 11/13. Pt now with adynamic ileus as demonstrated on imaging with poor PO intake. Pharmacy has been consulted for TPN to assist with meeting nutritional goals in setting of ileus.   11/30-12/1 calorie count:  consume 41% kCal and 57% protein; Megace added.   GI Imaging: 11/20 CT: adynamic colonic ileus, small B/L pleural effusions 11/23 slight decrease in gaseous distention in stomach, bowel. Unchanged in colon. 11/29 Abd xray - no evidence of bowel obstruction or ileus  GI Surgeries / Procedures:  11/13 cholecystostomy tube placed  Central access: 10/16/23 TPN start date: 10/16/23  Nutritional Goals: Goal TPN rate is: Clinimix E 8/10 1L on x5 days (Mon/Wed/Thur/Fri/Sun) Clinimix E 8/10 2L x2 days (Tues/Sat)  Plus SMOF Lipids at 20.69ml/hr every day Given concerns of volume overload this admission, did not add additional dextrose fluids. And now with pt taking po, will not add.  Electrolytes in Clinimix 8/10 E 1L bag: Na 35 mEq, K 30 mEq, Mag 5 mEq, Ca 4.5 mEq, Phos , Acetate 83, Cl 76 mEq  RD Estimated Needs Total Energy Estimated Needs: 1900-2100 Total Protein Estimated Needs: 95-110g Total Fluid Estimated Needs: >/=1.9L  Current Nutrition:  TPN Soft diet - ate ~80% of meals.  Picky eater, early satiety, abd tightness  Prosource TID - 4 charted given yesterday Boost TIDwm - 2 charted yesterday   Plan:  Per MD, ok to stop TPN after current order is completed.  Stopped BG checks     Alphia Moh, PharmD, BCPS, [facility] Clinical Pharmacist  Please check AMION for all [facility] Pharmacy phone numbers After 10:00 PM, call Main Pharmacy [phone]
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
## 2023-10-27 NOTE — TOC Progression Note (Signed)
Transition of Care [facility]) - Progression Note    Patient Details  Name: Jorge West MRN: [id number] Date of Birth: [birth year]  Transition of Care [facility]) CM/SW Contact  Eduard Roux, [place] Phone Number: 10/27/2023, 11:27 AM  Clinical Narrative:     TOC continues to follow for medical readiness-   Antony Blackbird, MSW, LCSW Clinical Social Worker    Expected Discharge Plan: Skilled Nursing Facility Barriers to Discharge: Insurance Authorization  Expected Discharge Plan and Services                                               Social Determinants of Health (SDOH) Interventions SDOH Screenings   Food Insecurity: No Food Insecurity (09/20/2023)  Housing: Low Risk  (09/20/2023)  Transportation Needs: No Transportation Needs (09/20/2023)  Utilities: Not At Risk (09/20/2023)  Depression (PHQ2-9): Medium Risk (09/05/2023)  Financial Resource Strain: Low Risk  (04/24/2023)   Received from [facility], [facility]  Physical Activity: Inactive (04/24/2023)   Received from [facility], [facility]  Social Connections: Moderately Integrated (04/24/2023)   Received from [facility], [facility]  Stress: No Stress Concern Present (07/18/2023)   Received from [facility]  Tobacco Use: Medium Risk (09/20/2023)    Readmission Risk Interventions     No data to display

## 2023-10-27 NOTE — Progress Notes (Addendum)
Mobility Specialist Progress Note:   10/27/23 1625  Mobility  Activity Transferred from chair to bed  Level of Assistance Maximum assist, patient does 25-49%  Assistive Device MaxiMove  LLE Weight Bearing WBAT  Activity Response Tolerated well  Mobility visit 1 Mobility  Mobility Specialist Start Time (ACUTE ONLY) 1550  Mobility Specialist Stop Time (ACUTE ONLY) 1620  Mobility Specialist Time Calculation (min) (ACUTE ONLY) 30 min   Pt received in chair, requesting to return to bed. Pt unable to stand in stedy with MaxA +2 d/t LLE pain. During standing trial pt found to have on going BM. NT present to perform pericare. Pt transferred to bed with maximove. No complaints stated during transition. Pt left in bed with NT still present in room.   Leory Plowman  Mobility Specialist Please contact via [employer] office at [phone]
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
## 2023-10-28 DIAGNOSIS — N1832 Chronic kidney disease, stage 3b: Secondary | ICD-10-CM | POA: Diagnosis not present

## 2023-10-28 DIAGNOSIS — N179 Acute kidney failure, unspecified: Secondary | ICD-10-CM | POA: Diagnosis not present

## 2023-10-28 LAB — GLUCOSE, CAPILLARY
Glucose-Capillary: 87 mg/dL (ref 70–99)
Glucose-Capillary: 95 mg/dL (ref 70–99)
Glucose-Capillary: 99 mg/dL (ref 70–99)
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
## 2023-10-29 ENCOUNTER — Inpatient Hospital Stay (HOSPITAL_COMMUNITY): Payer: Medicare PPO

## 2023-10-29 DIAGNOSIS — N179 Acute kidney failure, unspecified: Secondary | ICD-10-CM | POA: Diagnosis not present

## 2023-10-29 DIAGNOSIS — N1832 Chronic kidney disease, stage 3b: Secondary | ICD-10-CM | POA: Diagnosis not present

## 2023-10-29 LAB — CBC
HCT: 22.7 % — ABNORMAL LOW (ref 39.0–52.0)
Hemoglobin: 7.1 g/dL — ABNORMAL LOW (ref 13.0–17.0)
MCH: 29.7 pg (ref 26.0–34.0)
MCHC: 31.3 g/dL (ref 30.0–36.0)
MCV: 95 fL (ref 80.0–100.0)
Platelets: 195 10*3/uL (ref 150–400)
RBC: 2.39 MIL/uL — ABNORMAL LOW (ref 4.22–5.81)
RDW: 17 % — ABNORMAL HIGH (ref 11.5–15.5)
WBC: 4.9 10*3/uL (ref 4.0–10.5)
nRBC: 0 % (ref 0.0–0.2)

## 2023-10-29 LAB — IRON AND TIBC
Iron: 30 ug/dL — ABNORMAL LOW (ref 45–182)
Saturation Ratios: 14 % — ABNORMAL LOW (ref 17.9–39.5)
TIBC: 209 ug/dL — ABNORMAL LOW (ref 250–450)
UIBC: 179 ug/dL

## 2023-10-29 LAB — URINALYSIS, ROUTINE W REFLEX MICROSCOPIC
Bilirubin Urine: NEGATIVE
Glucose, UA: NEGATIVE mg/dL
Ketones, ur: NEGATIVE mg/dL
Nitrite: NEGATIVE
Protein, ur: NEGATIVE mg/dL
Specific Gravity, Urine: 1.017 (ref 1.005–1.030)
pH: 5 (ref 5.0–8.0)

## 2023-10-29 LAB — GLUCOSE, CAPILLARY
Glucose-Capillary: 94 mg/dL (ref 70–99)
Glucose-Capillary: 88 mg/dL (ref 70–99)

## 2023-10-29 LAB — FERRITIN: Ferritin: 209 ng/mL (ref 24–336)

## 2023-10-29 LAB — BASIC METABOLIC PANEL
Anion gap: 4 — ABNORMAL LOW (ref 5–15)
BUN: 40 mg/dL — ABNORMAL HIGH (ref 8–23)
CO2: 19 mmol/L — ABNORMAL LOW (ref 22–32)
Calcium: 8.8 mg/dL — ABNORMAL LOW (ref 8.9–10.3)
Chloride: 116 mmol/L — ABNORMAL HIGH (ref 98–111)
Creatinine, Ser: 1.39 mg/dL — ABNORMAL HIGH (ref 0.61–1.24)
GFR, Estimated: 52 mL/min — ABNORMAL LOW (ref >60)
Glucose, Bld: 96 mg/dL (ref 70–99)
Potassium: 4.2 mmol/L (ref 3.5–5.1)
Sodium: 139 mmol/L (ref 135–145)

## 2023-10-29 LAB — VITAMIN B12: Vitamin B-12: 1063 pg/mL — ABNORMAL HIGH (ref 180–914)

## 2023-10-29 LAB — LACTIC ACID, PLASMA: Lactic Acid, Venous: 1.1 mmol/L (ref 0.5–1.9)

## 2023-10-29 LAB — FOLATE: Folate: 22.9 ng/mL (ref >5.9)

## 2023-10-29 MED ORDER — ALPRAZOLAM 0.5 MG PO TABS
0.5000 mg | ORAL_TABLET | Freq: Every evening | ORAL | Status: DC | PRN
  Administered 2023-10-31: 0.5 mg via ORAL
  Filled 2023-10-29: qty 1
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
## 2023-10-29 NOTE — Plan of Care (Signed)

## 2023-10-29 NOTE — Plan of Care (Signed)
  Problem: Activity: Goal: Risk for activity intolerance will decrease Outcome: Progressing   Problem: Coping: Goal: Level of anxiety will decrease Outcome: Progressing   Problem: Pain Management: Goal: General experience of comfort will improve Outcome: Progressing   Problem: Safety: Goal: Ability to remain free from injury will improve Outcome: Progressing

## 2023-10-30 ENCOUNTER — Encounter (HOSPITAL_COMMUNITY): Payer: Medicare PPO

## 2023-10-30 DIAGNOSIS — N179 Acute kidney failure, unspecified: Secondary | ICD-10-CM | POA: Diagnosis not present

## 2023-10-30 DIAGNOSIS — N1832 Chronic kidney disease, stage 3b: Secondary | ICD-10-CM | POA: Diagnosis not present

## 2023-10-30 LAB — CBC WITH DIFFERENTIAL/PLATELET
Abs Immature Granulocytes: 0.02 10*3/uL (ref 0.00–0.07)
Basophils Absolute: 0 10*3/uL (ref 0.0–0.1)
Basophils Relative: 1 %
Eosinophils Absolute: 0.4 10*3/uL (ref 0.0–0.5)
Eosinophils Relative: 7 %
HCT: 23.3 % — ABNORMAL LOW (ref 39.0–52.0)
Hemoglobin: 7.4 g/dL — ABNORMAL LOW (ref 13.0–17.0)
Immature Granulocytes: 0 %
Lymphocytes Relative: 20 %
Lymphs Abs: 1 10*3/uL (ref 0.7–4.0)
MCH: 30.1 pg (ref 26.0–34.0)
MCHC: 31.8 g/dL (ref 30.0–36.0)
MCV: 94.7 fL (ref 80.0–100.0)
Monocytes Absolute: 0.4 10*3/uL (ref 0.1–1.0)
Monocytes Relative: 8 %
Neutro Abs: 3.1 10*3/uL (ref 1.7–7.7)
Neutrophils Relative %: 64 %
Platelets: 205 10*3/uL (ref 150–400)
RBC: 2.46 MIL/uL — ABNORMAL LOW (ref 4.22–5.81)
RDW: 16.7 % — ABNORMAL HIGH (ref 11.5–15.5)
WBC: 4.9 10*3/uL (ref 4.0–10.5)
nRBC: 0 % (ref 0.0–0.2)

## 2023-10-30 LAB — BASIC METABOLIC PANEL
Anion gap: 4 — ABNORMAL LOW (ref 5–15)
BUN: 34 mg/dL — ABNORMAL HIGH (ref 8–23)
CO2: 20 mmol/L — ABNORMAL LOW (ref 22–32)
Calcium: 8.4 mg/dL — ABNORMAL LOW (ref 8.9–10.3)
Chloride: 114 mmol/L — ABNORMAL HIGH (ref 98–111)
Creatinine, Ser: 1.32 mg/dL — ABNORMAL HIGH (ref 0.61–1.24)
GFR, Estimated: 56 mL/min — ABNORMAL LOW (ref >60)
Glucose, Bld: 89 mg/dL (ref 70–99)
Potassium: 4.1 mmol/L (ref 3.5–5.1)
Sodium: 138 mmol/L (ref 135–145)

## 2023-10-30 MED ORDER — FERROUS SULFATE 325 (65 FE) MG PO TABS
325.0000 mg | ORAL_TABLET | Freq: Every day | ORAL | Status: DC
  Administered 2023-10-31: 325 mg via ORAL
  Filled 2023-10-30: qty 1

## 2023-10-30 MED ORDER — SODIUM CHLORIDE 0.9 % IV SOLN
100.0000 mg | Freq: Once | INTRAVENOUS | Status: AC
  Administered 2023-10-30: 100 mg via INTRAVENOUS
  Filled 2023-10-30: qty 5

## 2023-10-30 NOTE — TOC Progression Note (Signed)
Transition of Care [facility]) - Progression Note    Patient Details  Name: Jorge West MRN: [id number] Date of Birth: 09/29/1946  [facility]) CM/SW Contact  Eduard Roux, [place] Phone Number: 10/30/2023, 12:50 PM  Clinical Narrative:     CSW spoke with patient's spouse- family remains agreeable to short term rehab at [facility].  [facility] confirmed bed availability.   Insurance auth approved [id number] 12/10-12/12.   TOC will continue to follow and assist with discharge planning.  Antony Blackbird, MSW, LCSW Clinical Social Worker    Expected Discharge Plan: Skilled Nursing Facility Barriers to Discharge: Insurance Authorization  Expected Discharge Plan and Services                                               Social Determinants of Health (SDOH) Interventions SDOH Screenings   Food Insecurity: No Food Insecurity (09/20/2023)  Housing: Low Risk  (09/20/2023)  Transportation Needs: No Transportation Needs (09/20/2023)  Utilities: Not At Risk (09/20/2023)  Depression (PHQ2-9): Medium Risk (09/05/2023)  Financial Resource Strain: Low Risk  (04/24/2023)   Received from [facility], [facility]  Physical Activity: Inactive (04/24/2023)   Received from [facility], [facility]  Social Connections: Moderately Integrated (04/24/2023)   Received from [facility], [facility]  Stress: No Stress Concern Present (07/18/2023)   Received from [facility]  Tobacco Use: Medium Risk (09/20/2023)    Readmission Risk Interventions     No data to display
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
## 2023-10-30 NOTE — Progress Notes (Signed)
Central line dressing change performed per policy without difficulty.
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
## 2023-10-31 ENCOUNTER — Inpatient Hospital Stay (HOSPITAL_COMMUNITY): Payer: Medicare PPO

## 2023-10-31 DIAGNOSIS — N1832 Chronic kidney disease, stage 3b: Secondary | ICD-10-CM | POA: Diagnosis not present

## 2023-10-31 DIAGNOSIS — N179 Acute kidney failure, unspecified: Secondary | ICD-10-CM | POA: Diagnosis not present

## 2023-10-31 HISTORY — PX: IR REMOVAL TUN CV CATH W/O FL: IMG2289

## 2023-10-31 LAB — CBC WITH DIFFERENTIAL/PLATELET
Abs Immature Granulocytes: 0.02 10*3/uL (ref 0.00–0.07)
Basophils Absolute: 0 10*3/uL (ref 0.0–0.1)
Basophils Relative: 1 %
Eosinophils Absolute: 0.2 10*3/uL (ref 0.0–0.5)
Eosinophils Relative: 5 %
HCT: 22.1 % — ABNORMAL LOW (ref 39.0–52.0)
Hemoglobin: 7 g/dL — ABNORMAL LOW (ref 13.0–17.0)
Immature Granulocytes: 0 %
Lymphocytes Relative: 20 %
Lymphs Abs: 0.9 10*3/uL (ref 0.7–4.0)
MCH: 29.5 pg (ref 26.0–34.0)
MCHC: 31.7 g/dL (ref 30.0–36.0)
MCV: 93.2 fL (ref 80.0–100.0)
Monocytes Absolute: 0.4 10*3/uL (ref 0.1–1.0)
Monocytes Relative: 9 %
Neutro Abs: 3.1 10*3/uL (ref 1.7–7.7)
Neutrophils Relative %: 65 %
Platelets: 203 10*3/uL (ref 150–400)
RBC: 2.37 MIL/uL — ABNORMAL LOW (ref 4.22–5.81)
RDW: 16.6 % — ABNORMAL HIGH (ref 11.5–15.5)
WBC: 4.7 10*3/uL (ref 4.0–10.5)
nRBC: 0 % (ref 0.0–0.2)

## 2023-10-31 LAB — HEMOGLOBIN AND HEMATOCRIT, BLOOD
HCT: 32.1 % — ABNORMAL LOW (ref 39.0–52.0)
Hemoglobin: 10.4 g/dL — ABNORMAL LOW (ref 13.0–17.0)

## 2023-10-31 LAB — PREPARE RBC (CROSSMATCH)

## 2023-10-31 MED ORDER — GERHARDT'S BUTT CREAM: 1.0000 | TOPICAL_CREAM | Freq: Four times a day (QID) | CUTANEOUS | Status: AC

## 2023-10-31 MED ORDER — BUSPIRONE HCL 5 MG PO TABS: 5.0000 mg | ORAL_TABLET | Freq: Two times a day (BID) | ORAL | Status: AC

## 2023-10-31 MED ORDER — BOOST PLUS PO LIQD: 237.0000 mL | Freq: Two times a day (BID) | ORAL | Status: AC

## 2023-10-31 MED ORDER — PROSOURCE PLUS PO LIQD: 30.0000 mL | Freq: Four times a day (QID) | ORAL | Status: AC

## 2023-10-31 MED ORDER — FERROUS SULFATE 325 (65 FE) MG PO TABS: 325.0000 mg | ORAL_TABLET | Freq: Every day | ORAL | Status: AC

## 2023-10-31 MED ORDER — METOPROLOL SUCCINATE ER 50 MG PO TB24: 50.0000 mg | ORAL_TABLET | Freq: Every day | ORAL | Status: AC

## 2023-10-31 MED ORDER — SODIUM CHLORIDE 0.9% IV SOLUTION: Freq: Once | INTRAVENOUS | Status: AC

## 2023-10-31 MED ORDER — ADULT MULTIVITAMIN W/MINERALS CH: 1.0000 | ORAL_TABLET | Freq: Every day | ORAL | Status: AC

## 2023-10-31 MED ORDER — MEGESTROL ACETATE 400 MG/10ML PO SUSP: 400.0000 mg | Freq: Every day | ORAL | Status: AC

## 2023-10-31 MED ORDER — FLUTICASONE PROPIONATE 50 MCG/ACT NA SUSP: 2.0000 | Freq: Every day | NASAL | Status: AC

## 2023-10-31 MED ORDER — AMIODARONE HCL 200 MG PO TABS: 200.0000 mg | ORAL_TABLET | Freq: Every day | ORAL | Status: AC

## 2023-10-31 NOTE — TOC Transition Note (Signed)
Transition of Care [facility]) - CM/SW Discharge Note   Patient Details  Name: Jorge West MRN: [id number] Date of Birth: Nov 04, 1946  Transition of Care [facility]) CM/SW Contact:  Eduard Roux, LCSW Phone Number: 10/31/2023, 3:19 PM   Clinical Narrative:     Patient will Discharge to: [facility] Discharge Date: 12/10/204 Family Notified: spouse Transport By: Sharin Mons  Per MD patient is ready for discharge. RN, patient, and facility notified of discharge. Discharge Summary sent to facility. RN given number for [phone]. Ambulance transport requested for patient.   Clinical Social Worker signing off.  Antony Blackbird, MSW, LCSW Clinical Social Worker     Final next level of care: Skilled Nursing Facility Barriers to Discharge: Barriers Resolved   Patient Goals and CMS Choice      Discharge Placement                Patient chooses bed at:  [facility]) Patient to be transferred to facility by: [facility] Name of family member notified: spouse Patient and family notified of of transfer: 10/31/23  Discharge Plan and Services Additional resources added to the After Visit Summary for                                       Social Determinants of Health (SDOH) Interventions SDOH Screenings   Food Insecurity: No Food Insecurity (09/20/2023)  Housing: Low Risk  (09/20/2023)  Transportation Needs: No Transportation Needs (09/20/2023)  Utilities: Not At Risk (09/20/2023)  Depression (PHQ2-9): Medium Risk (09/05/2023)  Financial Resource Strain: Low Risk  (04/24/2023)   Received from [facility]  Physical Activity: Inactive (04/24/2023)   Received from [facility], [facility]  Social Connections: Moderately Integrated (04/24/2023)   Received from [facility]  Stress: No Stress Concern Present (07/18/2023)   Received from [facility]  Tobacco Use: Medium Risk (09/20/2023)     Readmission Risk Interventions      No data to display
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
## 2023-10-31 NOTE — Discharge Summary (Signed)
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 basilar atelectasis. Electronically Signed   By: Richarda Overlie M.D.   On: 10/03/2023 11:53   US Abdomen Limited RUQ (LIVER/GB)  Result Date: 10/03/2023 CLINICAL DATA:  Right upper quadrant abdominal pain EXAM: ULTRASOUND ABDOMEN LIMITED RIGHT UPPER QUADRANT COMPARISON:  Abdominal CT 09/19/2023. FINDINGS: Gallbladder: Avascular sludge in the gallbladder. There is focal tenderness in a 5 mm wall thickness with moderate distension. No gallstone detected. Common bile duct: Diameter: 3 mm Liver: No focal lesion identified. Within normal limits in parenchymal echogenicity. Portal vein is patent on color Doppler imaging with normal direction of blood flow towards  the liver. IMPRESSION: Tender gallbladder with mild wall thickening but no underlying gallstone. Gallbladder sludge. Electronically Signed   By: Tiburcio Pea M.D.   On: 10/03/2023 07:41    Microbiology: Results for orders placed or performed during the hospital encounter of 09/19/23  Resp panel by RT-PCR (RSV, Flu A&B, Covid) Anterior Nasal Swab     Status: None   Collection Time: 10/03/23  5:18 AM   Specimen: Anterior Nasal Swab  Result Value Ref Range Status   SARS Coronavirus 2 by RT PCR NEGATIVE NEGATIVE Final   Influenza A by PCR NEGATIVE NEGATIVE Final   Influenza B by PCR NEGATIVE NEGATIVE Final    Comment: (NOTE) The Xpert Xpress SARS-CoV-2/FLU/RSV plus assay is intended as an aid in the diagnosis of influenza from Nasopharyngeal swab specimens and should not be used as a sole basis for treatment. Nasal washings and aspirates are unacceptable for Xpert Xpress SARS-CoV-2/FLU/RSV testing.  Fact Sheet for Patients: BloggerCourse.com  Fact Sheet for Healthcare Providers: SeriousBroker.it  This test is not yet approved or cleared by the [place] FDA and has been authorized for detection and/or diagnosis of SARS-CoV-2 by FDA under an Emergency Use Authorization (EUA). This EUA will remain in effect (meaning this test can be used) for the duration of the COVID-19 declaration under Section 564(b)(1) of the Act, 21 U.S.C. section 360bbb-3(b)(1), unless the authorization is terminated or revoked.     Resp Syncytial Virus by PCR NEGATIVE NEGATIVE Final    Comment: (NOTE) Fact Sheet for Patients: BloggerCourse.com  Fact Sheet for Healthcare Providers: SeriousBroker.it  This test is not  yet approved or cleared by the [place] and has been authorized for detection and/or diagnosis of SARS-CoV-2 by FDA under an Emergency Use Authorization (EUA). This EUA will  remain in effect (meaning this test can be used) for the duration of the COVID-19 declaration under Section 564(b)(1) of the Act, 21 U.S.C. section 360bbb-3(b)(1), unless the authorization is terminated or revoked.  Performed at [facility], [address] [place], [place] [address]   Culture, blood (Routine X 2) w Reflex to ID Panel     Status: None   Collection Time: 10/03/23  5:26 AM   Specimen: BLOOD LEFT HAND  Result Value Ref Range Status   Specimen Description BLOOD LEFT HAND  Final   Special Requests   Final    BOTTLES DRAWN AEROBIC AND ANAEROBIC Blood Culture adequate volume   Culture   Final    NO GROWTH 5 DAYS Performed at [facility], [address], [place], [place] [address]    Report Status 10/08/2023 FINAL  Final  Culture, blood (Routine X 2) w Reflex to ID Panel     Status: None   Collection Time: 10/03/23  5:29 AM   Specimen: BLOOD RIGHT HAND  Result Value Ref Range Status   Specimen Description BLOOD RIGHT HAND  Final   Special Requests   Final    BOTTLES DRAWN AEROBIC AND ANAEROBIC Blood Culture adequate volume   Culture   Final    NO GROWTH 5 DAYS Performed at [facility], [address] [place], [place] [address]    Report Status 10/08/2023 FINAL  Final  Urine Culture (for pregnant, neutropenic or urologic patients or patients with an indwelling urinary catheter)     Status: Abnormal   Collection Time: 10/04/23  9:09 AM   Specimen: Urine, Clean Catch  Result Value Ref Range Status   Specimen Description URINE, CLEAN CATCH  Final   Special Requests   Final    NONE Performed at [facility], [address], [place], [place] [address]    Culture 70,000 COLONIES/mL ENTEROCOCCUS FAECALIS (A)  Final   Report Status 10/07/2023 FINAL  Final   Organism ID, Bacteria ENTEROCOCCUS FAECALIS (A)  Final      Susceptibility   Enterococcus faecalis - MIC*    AMPICILLIN <=2 SENSITIVE Sensitive     NITROFURANTOIN <=16 SENSITIVE Sensitive      VANCOMYCIN 2 SENSITIVE Sensitive     * 70,000 COLONIES/mL ENTEROCOCCUS FAECALIS  Aerobic/Anaerobic Culture w Gram Stain (surgical/deep wound)     Status: None   Collection Time: 10/04/23  4:10 PM   Specimen: Gallbladder; Bile  Result Value Ref Range Status   Specimen Description GALL BLADDER  Final   Special Requests NONE  Final   Gram Stain   Final    RARE WBC PRESENT, PREDOMINANTLY PMN FEW GRAM NEGATIVE RODS    Culture   Final    MODERATE CITROBACTER KOSERI NO ANAEROBES ISOLATED Performed at [facility], [address] [place], [place] [address]    Report Status 10/09/2023 FINAL  Final   Organism ID, Bacteria CITROBACTER KOSERI  Final      Susceptibility   Citrobacter koseri - MIC*    CEFEPIME <=0.12 SENSITIVE Sensitive     CEFTAZIDIME <=1 SENSITIVE Sensitive     CEFTRIAXONE <=0.25 SENSITIVE Sensitive     CIPROFLOXACIN <=0.25 SENSITIVE Sensitive     GENTAMICIN <=1 SENSITIVE Sensitive     IMIPENEM <=0.25 SENSITIVE  Sensitive     TRIMETH/SULFA <=20 SENSITIVE Sensitive     PIP/TAZO <=4 SENSITIVE Sensitive ug/mL    * MODERATE CITROBACTER KOSERI  Culture, blood (Routine X 2) w Reflex to ID Panel     Status: None (Preliminary result)   Collection Time: 10/29/23  1:13 PM   Specimen: BLOOD LEFT ARM  Result Value Ref Range Status   Specimen Description BLOOD LEFT ARM  Final   Special Requests   Final    BOTTLES DRAWN AEROBIC ONLY Blood Culture adequate volume   Culture   Final    NO GROWTH 2 DAYS Performed at [facility], [address] [place], [place] [address]    Report Status PENDING  Incomplete  Culture, blood (Routine X 2) w Reflex to ID Panel     Status: None (Preliminary result)   Collection Time: 10/29/23  1:20 PM   Specimen: BLOOD RIGHT ARM  Result Value Ref Range Status   Specimen Description BLOOD RIGHT ARM  Final   Special Requests   Final    BOTTLES DRAWN AEROBIC AND ANAEROBIC Blood Culture results may not be optimal due to an inadequate  volume of blood received in culture bottles   Culture   Final    NO GROWTH 2 DAYS Performed at [facility], [address], [place], [place] [address]    Report Status PENDING  Incomplete    Labs: CBC: Recent Labs  Lab 10/29/23 0530 10/30/23 0500 10/31/23 0515  WBC 4.9 4.9 4.7  NEUTROABS  --  3.1 3.1  HGB 7.1* 7.4* 7.0*  HCT 22.7* 23.3* 22.1*  MCV 95.0 94.7 93.2  PLT 195 205 203   Basic Metabolic Panel: Recent Labs  Lab 10/25/23 0916 10/25/23 1051 10/26/23 0515 10/29/23 0530 10/30/23 0500  NA 131* 136 136 139 138  K 5.6* 4.8 3.9 4.2 4.1  CL 108 112* 110 116* 114*  CO2 19* 20* 19* 19* 20*  GLUCOSE 431* 108* 96 96 89  BUN 48* 49* 44* 40* 34*  CREATININE 1.20 1.23 1.30* 1.39* 1.32*  CALCIUM 8.5* 8.5* 8.5* 8.8* 8.4*  MG  --   --  1.9  --   --   PHOS  --   --  3.2  --   --    Liver Function Tests: Recent Labs  Lab 10/26/23 0515  AST 14*  ALT 14  ALKPHOS 51  BILITOT 0.5  PROT 5.3*  ALBUMIN 2.1*   CBG: Recent Labs  Lab 10/28/23 0618 10/28/23 1652 10/28/23 2314 10/29/23 0354 10/29/23 0559  GLUCAP 87 95 99 94 88    Discharge time spent: greater than 30 minutes.  Signed: Briant Cedar, MD [facility] 10/31/2023

## 2023-10-31 NOTE — Care Management Important Message (Signed)
Important Message  Patient Details  Name: Jorge West MRN: [id number] Date of Birth: 1946-08-08   Important Message Given:  Yes - Medicare IM     Sherilyn Banker 10/31/2023, 2:30 PM

## 2023-10-31 NOTE — Progress Notes (Signed)
10/31/2023 2245 Pt discharged to [facility] via [facility]. Kathryne Hitch

## 2023-11-01 ENCOUNTER — Other Ambulatory Visit: Payer: Self-pay | Admitting: Surgery

## 2023-11-01 ENCOUNTER — Encounter (HOSPITAL_COMMUNITY): Payer: Medicare PPO

## 2023-11-01 DIAGNOSIS — S72499A Other fracture of lower end of unspecified femur, initial encounter for closed fracture: Secondary | ICD-10-CM | POA: Insufficient documentation

## 2023-11-01 DIAGNOSIS — E872 Acidosis, unspecified: Secondary | ICD-10-CM

## 2023-11-01 LAB — TYPE AND SCREEN
ABO/RH(D): O POS
Antibody Screen: NEGATIVE
Unit division: 0
Unit division: 0

## 2023-11-01 LAB — BPAM RBC
Blood Product Expiration Date: 202412282359
Blood Product Expiration Date: 202412282359
ISSUE DATE / TIME: 202412101036
ISSUE DATE / TIME: 202412101318
Unit Type and Rh: 5100
Unit Type and Rh: 5100

## 2023-11-03 ENCOUNTER — Encounter (HOSPITAL_COMMUNITY): Payer: Medicare PPO

## 2023-11-03 LAB — CULTURE, BLOOD (ROUTINE X 2)
Culture: NO GROWTH
Culture: NO GROWTH
Special Requests: ADEQUATE

## 2023-11-06 ENCOUNTER — Encounter (HOSPITAL_COMMUNITY): Payer: Medicare PPO

## 2023-11-08 ENCOUNTER — Encounter (HOSPITAL_COMMUNITY): Payer: Medicare PPO

## 2023-11-10 ENCOUNTER — Encounter (HOSPITAL_COMMUNITY): Payer: Medicare PPO

## 2023-11-13 ENCOUNTER — Encounter (HOSPITAL_COMMUNITY): Payer: Medicare PPO

## 2023-11-17 ENCOUNTER — Encounter (HOSPITAL_COMMUNITY): Payer: Medicare PPO

## 2023-11-20 ENCOUNTER — Encounter (HOSPITAL_COMMUNITY): Payer: Medicare PPO

## 2023-11-24 ENCOUNTER — Encounter (HOSPITAL_COMMUNITY): Payer: Medicare PPO

## 2023-11-27 ENCOUNTER — Encounter (HOSPITAL_COMMUNITY): Payer: Medicare PPO

## 2023-11-29 ENCOUNTER — Encounter (HOSPITAL_COMMUNITY): Payer: Medicare PPO

## 2023-12-01 ENCOUNTER — Encounter (HOSPITAL_COMMUNITY): Payer: Medicare PPO

## 2023-12-04 ENCOUNTER — Other Ambulatory Visit: Payer: Medicare PPO

## 2023-12-04 ENCOUNTER — Encounter (HOSPITAL_COMMUNITY): Payer: Medicare PPO

## 2023-12-06 ENCOUNTER — Encounter (HOSPITAL_COMMUNITY): Payer: Medicare PPO

## 2023-12-07 ENCOUNTER — Ambulatory Visit: Payer: Medicare PPO | Admitting: Orthopedic Surgery

## 2023-12-08 ENCOUNTER — Ambulatory Visit (HOSPITAL_COMMUNITY): Payer: Medicare PPO

## 2023-12-20 ENCOUNTER — Ambulatory Visit: Payer: Medicare PPO | Admitting: Orthopaedic Surgery

## 2023-12-21 ENCOUNTER — Ambulatory Visit: Payer: Medicare PPO | Admitting: Orthopaedic Surgery

## 2023-12-28 ENCOUNTER — Telehealth (HOSPITAL_COMMUNITY): Payer: Self-pay

## 2023-12-28 ENCOUNTER — Telehealth (HOSPITAL_COMMUNITY): Payer: Self-pay | Admitting: Student

## 2023-12-28 NOTE — Telephone Encounter (Signed)
 Patient currently hospitalized at [facility] with plans to d/c to a rehab facility in [place]. Patient will need to arrange an appointment at [facility] for evaluation of his percutaneous cholecystostomy drain. The patient's wife has the number to our schedulers to call for the appointment. She also has the number for the IR APP office at [facility] for any questions/concerns prior to his appointment.   Sharnee Douglass, AGACNP-BC 12/28/2023, 9:04 AM

## 2023-12-28 NOTE — Telephone Encounter (Signed)
 Pt is currently in the hospital. His wife will call once d/c'd to get him rescheduled. AB

## 2023-12-29 ENCOUNTER — Ambulatory Visit (HOSPITAL_COMMUNITY): Payer: Medicare PPO

## 2024-01-20 DEATH — deceased

## 2024-09-23 ENCOUNTER — Encounter: Payer: Self-pay | Admitting: Radiology
